# Patient Record
Sex: Male | Born: 1935
Health system: Southern US, Community
[De-identification: ages and names within clinical notes are randomized; demographics above are authoritative.]

## PROBLEM LIST (undated history)

## (undated) DIAGNOSIS — C4490 Unspecified malignant neoplasm of skin, unspecified: Secondary | ICD-10-CM

## (undated) DIAGNOSIS — K4090 Unilateral inguinal hernia, without obstruction or gangrene, not specified as recurrent: Secondary | ICD-10-CM

## (undated) DIAGNOSIS — J302 Other seasonal allergic rhinitis: Secondary | ICD-10-CM

## (undated) DIAGNOSIS — H9319 Tinnitus, unspecified ear: Secondary | ICD-10-CM

## (undated) DIAGNOSIS — E785 Hyperlipidemia, unspecified: Secondary | ICD-10-CM

## (undated) DIAGNOSIS — N400 Enlarged prostate without lower urinary tract symptoms: Secondary | ICD-10-CM

## (undated) DIAGNOSIS — B059 Measles without complication: Secondary | ICD-10-CM

## (undated) DIAGNOSIS — Z0389 Encounter for observation for other suspected diseases and conditions ruled out: Secondary | ICD-10-CM

## (undated) DIAGNOSIS — K635 Polyp of colon: Secondary | ICD-10-CM

## (undated) DIAGNOSIS — I4892 Unspecified atrial flutter: Secondary | ICD-10-CM

## (undated) DIAGNOSIS — R0989 Other specified symptoms and signs involving the circulatory and respiratory systems: Secondary | ICD-10-CM

## (undated) DIAGNOSIS — E559 Vitamin D deficiency, unspecified: Secondary | ICD-10-CM

## (undated) DIAGNOSIS — I4819 Other persistent atrial fibrillation: Secondary | ICD-10-CM

## (undated) DIAGNOSIS — G629 Polyneuropathy, unspecified: Secondary | ICD-10-CM

## (undated) DIAGNOSIS — B269 Mumps without complication: Secondary | ICD-10-CM

## (undated) DIAGNOSIS — R0789 Other chest pain: Secondary | ICD-10-CM

## (undated) DIAGNOSIS — K429 Umbilical hernia without obstruction or gangrene: Secondary | ICD-10-CM

## (undated) DIAGNOSIS — I10 Essential (primary) hypertension: Secondary | ICD-10-CM

## (undated) DIAGNOSIS — D696 Thrombocytopenia, unspecified: Secondary | ICD-10-CM

## (undated) HISTORY — DX: Other persistent atrial fibrillation: I48.19

## (undated) HISTORY — DX: Unilateral inguinal hernia, without obstruction or gangrene, not specified as recurrent: K40.90

## (undated) HISTORY — DX: Polyneuropathy, unspecified: G62.9

## (undated) HISTORY — DX: Umbilical hernia without obstruction or gangrene: K42.9

## (undated) HISTORY — DX: Mumps without complication: B26.9

## (undated) HISTORY — DX: Other specified symptoms and signs involving the circulatory and respiratory systems: R09.89

## (undated) HISTORY — DX: Polyp of colon: K63.5

## (undated) HISTORY — DX: Other chest pain: R07.89

## (undated) HISTORY — DX: Thrombocytopenia, unspecified: D69.6

## (undated) HISTORY — PX: HAND SURGERY: SHX662

## (undated) HISTORY — DX: Measles without complication: B05.9

## (undated) HISTORY — DX: Tinnitus, unspecified ear: H93.19

## (undated) HISTORY — PX: INGUINAL HERNIA REPAIR: SUR1180

## (undated) HISTORY — DX: Unspecified atrial flutter: I48.92

## (undated) HISTORY — DX: Benign prostatic hyperplasia without lower urinary tract symptoms: N40.0

## (undated) HISTORY — DX: Vitamin D deficiency, unspecified: E55.9

## (undated) SURGERY — ECHOCARDIOGRAM, TRANSESOPHAGEAL
Anesthesia: Moderate Sedation

---

## 2000-11-02 DIAGNOSIS — IMO0001 Reserved for inherently not codable concepts without codable children: Secondary | ICD-10-CM

## 2000-11-02 HISTORY — PX: CARDIAC CATHETERIZATION: SHX172

## 2000-11-02 HISTORY — DX: Reserved for inherently not codable concepts without codable children: IMO0001

## 2001-06-08 ENCOUNTER — Other Ambulatory Visit: Admission: RE | Admit: 2001-06-08 | Discharge: 2001-06-08 | Payer: Self-pay | Admitting: Urology

## 2001-06-08 ENCOUNTER — Encounter (INDEPENDENT_AMBULATORY_CARE_PROVIDER_SITE_OTHER): Payer: Self-pay | Admitting: Specialist

## 2001-09-25 ENCOUNTER — Encounter: Payer: Self-pay | Admitting: Emergency Medicine

## 2001-09-25 ENCOUNTER — Inpatient Hospital Stay (HOSPITAL_COMMUNITY): Admission: EM | Admit: 2001-09-25 | Discharge: 2001-09-26 | Payer: Self-pay | Admitting: Emergency Medicine

## 2002-07-05 ENCOUNTER — Emergency Department (HOSPITAL_COMMUNITY): Admission: EM | Admit: 2002-07-05 | Discharge: 2002-07-05 | Payer: Self-pay | Admitting: *Deleted

## 2003-11-21 ENCOUNTER — Encounter (INDEPENDENT_AMBULATORY_CARE_PROVIDER_SITE_OTHER): Payer: Self-pay | Admitting: Specialist

## 2003-11-21 ENCOUNTER — Ambulatory Visit (HOSPITAL_COMMUNITY): Admission: RE | Admit: 2003-11-21 | Discharge: 2003-11-21 | Payer: Self-pay | Admitting: *Deleted

## 2004-01-10 ENCOUNTER — Ambulatory Visit (HOSPITAL_COMMUNITY): Admission: RE | Admit: 2004-01-10 | Discharge: 2004-01-10 | Payer: Self-pay | Admitting: *Deleted

## 2004-08-07 ENCOUNTER — Ambulatory Visit: Payer: Self-pay | Admitting: Cardiology

## 2005-04-09 ENCOUNTER — Emergency Department (HOSPITAL_COMMUNITY): Admission: EM | Admit: 2005-04-09 | Discharge: 2005-04-09 | Payer: Self-pay | Admitting: *Deleted

## 2006-02-24 ENCOUNTER — Ambulatory Visit: Payer: Self-pay | Admitting: Internal Medicine

## 2006-11-03 ENCOUNTER — Encounter: Admission: RE | Admit: 2006-11-03 | Discharge: 2006-11-03 | Payer: Self-pay | Admitting: *Deleted

## 2009-11-02 DIAGNOSIS — D696 Thrombocytopenia, unspecified: Secondary | ICD-10-CM

## 2009-11-02 HISTORY — DX: Thrombocytopenia, unspecified: D69.6

## 2010-01-01 ENCOUNTER — Encounter: Admission: RE | Admit: 2010-01-01 | Discharge: 2010-01-01 | Payer: Self-pay | Admitting: General Surgery

## 2010-02-10 ENCOUNTER — Ambulatory Visit (HOSPITAL_COMMUNITY): Admission: RE | Admit: 2010-02-10 | Discharge: 2010-02-10 | Payer: Self-pay | Admitting: General Surgery

## 2011-01-21 LAB — CBC
MCV: 94.1 fL (ref 78.0–100.0)
WBC: 4.6 10*3/uL (ref 4.0–10.5)

## 2011-01-21 LAB — COMPREHENSIVE METABOLIC PANEL
ALT: 18 U/L (ref 0–53)
AST: 22 U/L (ref 0–37)
Albumin: 3.7 g/dL (ref 3.5–5.2)
Calcium: 8.6 mg/dL (ref 8.4–10.5)
Chloride: 108 mEq/L (ref 96–112)
Creatinine, Ser: 1.12 mg/dL (ref 0.4–1.5)
GFR calc Af Amer: >60 mL/min (ref >60)
GFR calc non Af Amer: >60 mL/min (ref >60)
Glucose, Bld: 103 mg/dL — ABNORMAL HIGH (ref 70–99)
Potassium: 3.7 mEq/L (ref 3.5–5.1)
Sodium: 139 mEq/L (ref 135–145)
Total Bilirubin: 0.8 mg/dL (ref 0.3–1.2)
Total Protein: 6.7 g/dL (ref 6.0–8.3)

## 2011-01-21 LAB — URINALYSIS, ROUTINE W REFLEX MICROSCOPIC
Bilirubin Urine: NEGATIVE
Glucose, UA: NEGATIVE mg/dL
Urobilinogen, UA: 1 mg/dL (ref 0.0–1.0)

## 2011-01-21 LAB — DIFFERENTIAL
Basophils Absolute: 0 10*3/uL (ref 0.0–0.1)
Basophils Relative: 1 % (ref 0–1)
Eosinophils Relative: 3 % (ref 0–5)
Monocytes Absolute: 0.3 10*3/uL (ref 0.1–1.0)
Monocytes Relative: 7 % (ref 3–12)
Neutro Abs: 3.3 10*3/uL (ref 1.7–7.7)

## 2011-03-06 ENCOUNTER — Emergency Department (HOSPITAL_BASED_OUTPATIENT_CLINIC_OR_DEPARTMENT_OTHER)
Admission: EM | Admit: 2011-03-06 | Discharge: 2011-03-06 | Disposition: A | Discharge status: Home / Self Care | Payer: Medicare Other | Attending: Emergency Medicine | Admitting: Emergency Medicine

## 2011-03-06 DIAGNOSIS — I4891 Unspecified atrial fibrillation: Secondary | ICD-10-CM | POA: Insufficient documentation

## 2011-03-06 DIAGNOSIS — E7800 Pure hypercholesterolemia, unspecified: Secondary | ICD-10-CM | POA: Insufficient documentation

## 2011-03-06 DIAGNOSIS — R002 Palpitations: Secondary | ICD-10-CM | POA: Insufficient documentation

## 2011-03-06 DIAGNOSIS — I10 Essential (primary) hypertension: Secondary | ICD-10-CM | POA: Insufficient documentation

## 2011-03-06 DIAGNOSIS — Z79899 Other long term (current) drug therapy: Secondary | ICD-10-CM | POA: Insufficient documentation

## 2011-03-06 LAB — BASIC METABOLIC PANEL
BUN: 19 mg/dL (ref 6–23)
CO2: 25 mEq/L (ref 19–32)
Calcium: 9.2 mg/dL (ref 8.4–10.5)
GFR calc non Af Amer: >60 mL/min (ref >60)
Glucose, Bld: 136 mg/dL — ABNORMAL HIGH (ref 70–99)

## 2011-03-20 NOTE — Op Note (Signed)
NAME:  AMEDEO, DETWEILER NO.:  [id number]   MEDICAL RECORD NO.:  [id number]                   PATIENT TYPE:  AMB   LOCATION:  ENDO                                 FACILITY:  [facility]   PHYSICIAN:  Georgiana Spinner, M.D.                 DATE OF BIRTH:  12/28/1934   DATE OF PROCEDURE:  11/21/2003  DATE OF DISCHARGE:                                 OPERATIVE REPORT   PROCEDURE:  Colonoscopy with biopsy.   INDICATIONS:  Diarrhea, colon cancer screening.   ANESTHESIA:  1. Demerol 10 mg.  2. Versed 1 mg.   DESCRIPTION OF PROCEDURE:  With patient mildly sedated in the left lateral  decubitus position, the Olympus videoscopic colonoscope was inserted in the  rectum after normal rectal exam and passed under direct vision to the cecum,  identified by the ileocecal valve and crow's foot of the cecum, the latter  of which was photographed.  We entered into the terminal ileum which  appeared normal and was photographed.  From this point, the colonoscope was  then slowly withdrawn, taking circumferential views of the colonic mucosa,  stopping only to take random colonic biopsies along the way until we reached  the rectum which appeared normal in direct and retroflexed view.  The  endoscope was straightened, withdrawn.  The patient's vital signs and pulse  oximeter remained stable.  The patient tolerated the procedure well without  apparent complications.   FINDINGS:  1. Thickened sigmoid colon with diverticulosis.  2. Otherwise, an unremarkable examination.  3. Await biopsy report.  4. The patient will call me for results and follow up with me as an     outpatient.                                               Georgiana Spinner, M.D.    GMO/MEDQ  D:  11/21/2003  T:  11/21/2003  Job:  [id number]

## 2011-03-20 NOTE — H&P (Signed)
[place]. [facility]  Patient:    Stanley, Waters Visit Number: [id number] MRN: [id number]          Service Type: MED Location: [phone] Attending Physician:  Clovis Cao Dictated by:   Marya Fossa, P.A. Admit Date:  09/25/2001                           History and Physical  DATE OF BIRTH: Mar 17, 1935  ADMISSION DIAGNOSES:  1. New onset atrial fibrillation.  2. Benign prostatic hypertrophy.  CHIEF COMPLAINT: Heart racing and shortness of breath.  HISTORY OF PRESENT ILLNESS: Stanley Waters is a pleasant 76 year old married white male, without prior cardiac history.  He has had shortness of breath x 2-3 months.  Over this times he has had intermittent heart racing which has been short-lived.  Today while helping his wife off the commode his heart began racing and persisted.  He felt weak, short of breath, with mild chest tightness.  Took Tums and aspirin and felt like his heart slowed a little. This went on for an hour to 1-1/2 hours.  He called Dr. Guadlupe Spanish answering service and then decided to come in and get checked out.  His pastor brought him to the emergency room.  He is currently feeling better.  IV fluids have been started but no meds have been given.  His admitting EKG shows A fib with rapid ventricular rate, heart rate 121, BP stable.  ALLERGIES: NKDA.  MEDICATIONS: Doxazosin 4 mg q.d.  PAST MEDICAL HISTORY: BPH.  No MI, stroke, diabetes, or thyroid disease.  SOCIAL HISTORY: The patient is married.  Wife has Alzheimers disease.  Father of two, grandfather of four.  Quit smoking 38 years ago.  No alcohol.  No cocaine or marijuana.  He currently works in Human resources officer.  FAMILY HISTORY: Mother at age 72 died of CVA.  Father deceased at age 61 of old age.  He had a pacemaker.  He had a total of four sisters.  One is deceased.  Four brothers, all four deceased.  One had CHF.  REVIEW OF SYSTEMS: No constitutional  symptoms.  He did have a cold three weeks ago.  No GI complaints.  Does have BPH.  No significant weight changes.  PHYSICAL EXAMINATION:  VITAL SIGNS: Blood pressure 124/82, pulse 69, respirations 18.  GENERAL: The patient is alert and oriented x 3, in no acute distress.  He is attended by his daughter-in-law and son.  HEENT: Normocephalic, atraumatic.  PERRL.  EOMI.  Nares patent.  Pharynx benign.  NECK: Supple without bruits or masses.  LUNGS: Clear to auscultation.  COR: Regular rate and rhythm without murmurs, rubs, or gallops.  ABDOMEN: Soft, nontender, nondistended.  Normoactive bowel sounds x 4 quadrants.  No HSM.  EXTREMITIES: No edema, 2+ femoral pulses, 2+ tibial pulses, no bruits.  NEUROLOGIC: No gross focal deficits noted.  RECTAL: Heme negative.  LABORATORY DATA: EKG shows A fib with RVR.  Rate 121.  Chest x-ray pending.  WBC 6.7, hemoglobin 15.6, platelets 186,000.  CK 80, MB 3.9, troponin I less than 0.01.  INR 1.2.  BMP pending.  IMPRESSION:  1. New onset atrial fibrillation, spontaneous conversion to sinus rhythm.  2. Unknown lipid status.  3. Benign prostatic hypertrophy.  PLAN: We will admit the patient now despite having converted to sinus rhythm. He was in atrial fibrillation for a prolonged period of time.  Will start him  on IV heparin.  Will also start him on Coumadin tonight but long-term management will need to be discussed with Dr. Lucas Mallow in the morning.  Will transfer care to Dr. Lucas Mallow in the morning.  Will order a 2D echocardiogram. Need to consider outpatient stress testing.  Will add low-dose beta-blocker to help prevent recurrent tachyarrhythmia. Dictated by:   Marya Fossa, P.A. Attending Physician:  Clovis Cao DD:  09/25/01 TD:  09/26/01 Job: [id number]

## 2011-03-20 NOTE — Op Note (Signed)
NAME:  Stanley Waters, Stanley Waters NO.:  [id number]   MEDICAL RECORD NO.:  [id number]                   PATIENT TYPE:  AMB   LOCATION:  ENDO                                 FACILITY:  [facility]   PHYSICIAN:  Georgiana Spinner, M.D.                 DATE OF BIRTH:  Apr 05, 1935   DATE OF PROCEDURE:  11/21/2003  DATE OF DISCHARGE:                                 OPERATIVE REPORT   PROCEDURE:  Upper endoscopy.   INDICATIONS:  Abdominal pain.   ANESTHESIA:  1. Demerol 50 mg.  2. Versed 5 mg.   DESCRIPTION OF PROCEDURE:  With patient mildly sedated in the left lateral  decubitus position, the Olympus videoscopic endoscope was inserted in the  mouth, passed under direct vision through the esophagus, which appeared  normal.  There was no evidence of Barrett's.  We entered into the stomach.  Fundus, body, antrum, duodenal bulb, and second portion of duodenum were  visualized.  Of note, there was a distinct lack of peristalsis.  From this  point, the endoscope was slowly withdrawn, taking circumferential views of  the duodenal mucosa until the endoscope then pulled back into the stomach,  placed in retroflexion to view the stomach from below.  The endoscope was  straightened and withdrawn, taking circumferential views of the remaining  gastric and esophageal mucosa, stopping only in the stomach to take biopsies  of erythema seen in the antrum and body.  The patient's vital signs and  pulse oximeter remained stable.  The patient tolerated the procedure well  without apparent complications.   FINDINGS:  Mild erythema of gastric antrum and body, decreased peristalsis.   PLAN:  1. Await biopsy report.  2. The patient will call me for results and follow up with me as an     outpatient.  3. Proceed to colonoscopy as planned.                                               Georgiana Spinner, M.D.    GMO/MEDQ  D:  11/21/2003  T:  11/21/2003  Job:  [id number]

## 2011-03-20 NOTE — Cardiovascular Report (Signed)
[place]  Patient:    Stanley Waters, Stanley Waters Visit Number: [id number] MRN: [id number]          Service Type: MED Location: [phone] Attending Physician:  Clovis Cao Dictated by:   Aram Candela. Aleen Campi, M.D. Proc. Date: 09/26/01 Admit Date:  09/25/2001   CC:         Jaclyn Prime. Lucas Mallow, M.D.  Cardiac Catheterization Lab,    Cardiac Catheterization  PROCEDURES: 1. Left heart catheterization. 2. Coronary cineangiography. 3. Left ventricular cineangiography. 4. Abdominal aortogram. 5. Perclose of the right femoral artery.  INDICATION FOR PROCEDURES:  This 76 year old male was admitted last evening after the onset of racing heart which was associated with shortness of breath, weakness, and mid chest tightness.  His initial enzymes were negative; however, this mornings enzymes were mildly positive, suggesting myocardial ischemia.  He also has a history of intermittent hypertension which is not presently treated.  PROCEDURE IN DETAIL:  After signing an informed consent, the patient was premedicated with 50 mg of Benadryl intravenously and brought to the cardiac catheterization lab.  His right groin was prepped and draped in a sterile fashion and anesthetized locally with 1% lidocaine.  A 6-French introducer sheath was inserted percutaneously into the right femoral artery.  Then 6-French, #4 Judkins coronary cathethers were used to make injections into the coronary arteries.  A 6-French pigtail catheter was used to measure pressures in the left ventricle and aorta and to make midstream injections into the left ventricle and abdominal aorta.  The patient tolerated the procedure well, and no complications were noted.  At the end of the procedure, the catheter and sheath were removed from the right femoral artery, and hemostasis was easily obtained with a Perclose closure system.  MEDICATIONS GIVEN:  Versed 1 mg IV.  HEMODYNAMIC  DATA:  Left ventricular pressure 116/5 to 17, aortic pressure 116/69 with a mean of 87.  Left ventricular ejection fraction was estimated at 60%.  CINE FINDINGS:  CORONARY CINEANGIOGRAPHY:  Left coronary artery: The ostium and left main appear normal.  Left anterior descending artery: The LAD has a minor plaque in its proximal segment which causes a less than 20% narrowing and causes very mild sequestration of dye in the middle segment.  There is very good antegrade flow into the distal segment.  The septal and anterolateral branches all appear normal.  Optional diagonal branch appears normal without evidence of stenosis.  Circumflex coronary artery appears normal without evidence for plaque.  There is very normal flow in the circumflex.  Right coronary artery: The ostium appears normal.  The proximal segment has a minor plaque causing a segmental 20% stenosis.  There is very good antegrade flow throughout the right coronary system.  There was no evidence for other plaque throughout the middle and distal segment.  LEFT VENTRICULAR CINEANGIOGRAM:  The left ventricular chamber size and contractility appeared normal.  The ejection fraction was estimated at approximately 60%.  There was no evidence for segmental abnormality.  The mitral and aortic valves appeared normal.  ABDOMINAL AORTOGRAM:  The distal abdominal aorta has moderate calcification which was seen while inserting the guidewire and catheters.  There was no significant stenosis and no evidence of aneurysm.  The proximal abdominal aorta appears normal.  The renal arteries appear normal with normal flow.  FINAL DIAGNOSES: 1. Essentially normal coronary flow with minor nonobstructive plaque in the    proximal left anterior descending artery and right coronary artery at  20%. 2. Normal left ventricular function. 3. Moderate calcification of the distal abdominal aorta without stenosis or    aneurysm. 4. Normal renal  arteries. 5. Successful Perclose of the right femoral artery.  DISPOSITION:  As per Dr. Lucas Mallow. Dictated by:   Aram Candela. Aleen Campi, M.D. Attending Physician:  Clovis Cao DD:  09/26/01 TD:  09/26/01 Job: [id number]

## 2011-03-22 ENCOUNTER — Emergency Department (HOSPITAL_COMMUNITY): Payer: Medicare Other

## 2011-03-22 ENCOUNTER — Emergency Department (HOSPITAL_COMMUNITY)
Admission: EM | Admit: 2011-03-22 | Discharge: 2011-03-22 | Disposition: A | Discharge status: Home / Self Care | Payer: Medicare Other | Attending: Emergency Medicine | Admitting: Emergency Medicine

## 2011-03-22 DIAGNOSIS — R509 Fever, unspecified: Secondary | ICD-10-CM | POA: Insufficient documentation

## 2011-03-22 DIAGNOSIS — I440 Atrioventricular block, first degree: Secondary | ICD-10-CM | POA: Insufficient documentation

## 2011-03-22 DIAGNOSIS — I10 Essential (primary) hypertension: Secondary | ICD-10-CM | POA: Insufficient documentation

## 2011-03-22 DIAGNOSIS — N390 Urinary tract infection, site not specified: Secondary | ICD-10-CM | POA: Insufficient documentation

## 2011-03-22 LAB — CBC
Hemoglobin: 15 g/dL (ref 13.0–17.0)
MCH: 32.1 pg (ref 26.0–34.0)
MCHC: 35.5 g/dL (ref 30.0–36.0)
MCV: 90.2 fL (ref 78.0–100.0)
Platelets: 131 10*3/uL — ABNORMAL LOW (ref 150–400)
RBC: 4.68 MIL/uL (ref 4.22–5.81)
WBC: 5 10*3/uL (ref 4.0–10.5)

## 2011-03-22 LAB — URINALYSIS, ROUTINE W REFLEX MICROSCOPIC
Bilirubin Urine: NEGATIVE
Leukocytes, UA: NEGATIVE
Urobilinogen, UA: 1 mg/dL (ref 0.0–1.0)
pH: 6.5 (ref 5.0–8.0)

## 2011-03-22 LAB — DIFFERENTIAL
Basophils Absolute: 0 10*3/uL (ref 0.0–0.1)
Basophils Relative: 0 % (ref 0–1)
Eosinophils Absolute: 0.1 10*3/uL (ref 0.0–0.7)
Lymphs Abs: 0.8 10*3/uL (ref 0.7–4.0)
Monocytes Relative: 14 % — ABNORMAL HIGH (ref 3–12)

## 2011-03-22 LAB — BASIC METABOLIC PANEL
CO2: 25 mEq/L (ref 19–32)
Calcium: 8.9 mg/dL (ref 8.4–10.5)
Glucose, Bld: 107 mg/dL — ABNORMAL HIGH (ref 70–99)

## 2011-03-29 LAB — CULTURE, BLOOD (ROUTINE X 2)
Culture  Setup Time: 201205210012
Culture  Setup Time: 201205210012
Culture: NO GROWTH
Culture: NO GROWTH

## 2011-09-22 DIAGNOSIS — K635 Polyp of colon: Secondary | ICD-10-CM

## 2011-09-22 HISTORY — DX: Polyp of colon: K63.5

## 2011-09-22 HISTORY — PX: COLONOSCOPY W/ POLYPECTOMY: SHX1380

## 2012-07-03 ENCOUNTER — Encounter (HOSPITAL_BASED_OUTPATIENT_CLINIC_OR_DEPARTMENT_OTHER): Payer: Self-pay | Admitting: Emergency Medicine

## 2012-07-03 ENCOUNTER — Emergency Department (HOSPITAL_BASED_OUTPATIENT_CLINIC_OR_DEPARTMENT_OTHER)
Admission: EM | Admit: 2012-07-03 | Discharge: 2012-07-03 | Disposition: A | Discharge status: Home / Self Care | Payer: Medicare Other | Attending: Emergency Medicine | Admitting: Emergency Medicine

## 2012-07-03 DIAGNOSIS — E785 Hyperlipidemia, unspecified: Secondary | ICD-10-CM | POA: Insufficient documentation

## 2012-07-03 DIAGNOSIS — I2510 Atherosclerotic heart disease of native coronary artery without angina pectoris: Secondary | ICD-10-CM | POA: Insufficient documentation

## 2012-07-03 DIAGNOSIS — R002 Palpitations: Secondary | ICD-10-CM | POA: Insufficient documentation

## 2012-07-03 DIAGNOSIS — I4891 Unspecified atrial fibrillation: Secondary | ICD-10-CM

## 2012-07-03 DIAGNOSIS — R000 Tachycardia, unspecified: Secondary | ICD-10-CM | POA: Insufficient documentation

## 2012-07-03 DIAGNOSIS — Z79899 Other long term (current) drug therapy: Secondary | ICD-10-CM | POA: Insufficient documentation

## 2012-07-03 DIAGNOSIS — I10 Essential (primary) hypertension: Secondary | ICD-10-CM | POA: Insufficient documentation

## 2012-07-03 HISTORY — DX: Hyperlipidemia, unspecified: E78.5

## 2012-07-03 HISTORY — DX: Essential (primary) hypertension: I10

## 2012-07-03 LAB — CBC
HCT: 43.1 % (ref 39.0–52.0)
MCV: 90.4 fL (ref 78.0–100.0)
RBC: 4.77 MIL/uL (ref 4.22–5.81)
WBC: 5.8 10*3/uL (ref 4.0–10.5)

## 2012-07-03 LAB — BASIC METABOLIC PANEL
BUN: 16 mg/dL (ref 6–23)
CO2: 25 mEq/L (ref 19–32)
Chloride: 102 mEq/L (ref 96–112)
Creatinine, Ser: 1.1 mg/dL (ref 0.50–1.35)

## 2012-07-03 LAB — TROPONIN I: Troponin I: <0.3 ng/mL (ref <0.30)

## 2012-07-03 MED ORDER — METOPROLOL TARTRATE 1 MG/ML IV SOLN
5.0000 mg | Freq: Once | INTRAVENOUS | Status: AC
  Administered 2012-07-03: 5 mg via INTRAVENOUS
  Filled 2012-07-03: qty 5

## 2012-07-03 NOTE — ED Provider Notes (Addendum)
History     CSN: [id number]  Arrival date & time 07/03/12  1145   First MD Initiated Contact with Patient 07/03/12 1302      Chief Complaint  Patient presents with  . Tachycardia    (Consider location/radiation/quality/duration/timing/severity/associated sxs/prior treatment) HPI Comments: Patient presents today with her palpitations. He states he has a history of intermittent atrial fibrillation and feels like he was "fibrillating today". He states that the feeling started about 5:30 this morning and has lasted until now. He denies any chest pain or tightness. Denies any shortness of breath. He had a little bit of dizziness earlier but denies any now. He states he's had this happen in the past but doesn't usually last this long. She denies any leg pain or swelling. Denies any recent changes in his medication. He took an extra dose of his Lopressor this morning.   Past Medical History  Diagnosis Date  . Atrial fibrillation   . Hypertension   . Coronary artery disease   . Hyperlipemia     No past surgical history on file.  No family history on file.  History  Substance Use Topics  . Smoking status: Not on file  . Smokeless tobacco: Not on file  . Alcohol Use:       Review of Systems  Constitutional: Negative for fever, chills, diaphoresis and fatigue.  HENT: Negative for congestion, rhinorrhea and sneezing.   Eyes: Negative.   Respiratory: Negative for cough, chest tightness and shortness of breath.   Cardiovascular: Positive for palpitations. Negative for chest pain and leg swelling.  Gastrointestinal: Negative for nausea, vomiting, abdominal pain, diarrhea and blood in stool.  Genitourinary: Negative for frequency, hematuria, flank pain and difficulty urinating.  Musculoskeletal: Negative for back pain and arthralgias.  Skin: Negative for rash.  Neurological: Negative for dizziness, speech difficulty, weakness, numbness and headaches.    Allergies  Doxycycline and  Prednisone  Home Medications   Current Outpatient Rx  Name Route Sig Dispense Refill  . ASPIRIN 162 MG PO TBEC Oral Take 162 mg by mouth daily.    [name] DOXAZOSIN MESYLATE 8 MG PO TABS Oral Take 8 mg by mouth at bedtime.    . OMEGA-3 FATTY ACIDS 1000 MG PO CAPS Oral Take 1,200 mg by mouth daily.    . FUROSEMIDE 20 MG PO TABS Oral Take 20 mg by mouth as needed.    [name] LISINOPRIL 2.5 MG PO TABS Oral Take 2.5 mg by mouth 2 (two) times daily.    [name] METOPROLOL TARTRATE 25 MG PO TABS Oral Take 12.5 mg by mouth 2 (two) times daily.    . ICAPS PO CAPS Oral Take 1 capsule by mouth daily.    [name] PROPAFENONE HCL 225 MG PO TABS Oral Take 225 mg by mouth 3 (three) times daily.    [name] SIMVASTATIN 5 MG PO TABS Oral Take 5 mg by mouth at bedtime.    [name] VITAMIN D (ERGOCALCIFEROL) 50000 UNITS PO CAPS Oral Take 50,000 Units by mouth every 7 (seven) days.      BP 147/95  Pulse 89  Temp 97.8 F (36.6 C) (Oral)  Resp 16  Ht 5\' 11"  (1.803 m)  Wt 200 lb (90.719 kg)  BMI 27.89 kg/m2  SpO2 100%  Physical Exam  Constitutional: He is oriented to person, place, and time. He appears well-developed and well-nourished.  HENT:  Head: Normocephalic and atraumatic.  Eyes: Pupils are equal, round, and reactive to light.  Neck: Normal range of motion. Neck  supple.  Cardiovascular: Normal rate and normal heart sounds.  An irregular rhythm present.  Pulmonary/Chest: Effort normal and breath sounds normal. No respiratory distress. He has no wheezes. He has no rales. He exhibits no tenderness.  Abdominal: Soft. Bowel sounds are normal. There is no tenderness. There is no rebound and no guarding.  Musculoskeletal: Normal range of motion. He exhibits no edema.  Lymphadenopathy:    He has no cervical adenopathy.  Neurological: He is alert and oriented to person, place, and time.  Skin: Skin is warm and dry. No rash noted.  Psychiatric: He has a normal mood and affect.    ED Course  Procedures (including critical care  time)  Labs Reviewed  CBC - Abnormal; Notable for the following:    Platelets 141 (*)     All other components within normal limits  BASIC METABOLIC PANEL - Abnormal; Notable for the following:    Glucose, Bld 103 (*)     GFR calc non Af Amer 64 (*)     GFR calc Af Amer 74 (*)     All other components within normal limits  TROPONIN I   No results found. Results for orders placed during the hospital encounter of 07/03/12  CBC      Component Value Range   WBC 5.8  4.0 - 10.5 K/uL   RBC 4.77  4.22 - 5.81 MIL/uL   Hemoglobin 15.5  13.0 - 17.0 g/dL   HCT 14.7  82.9 - 56.2 %   MCV 90.4  78.0 - 100.0 fL   MCH 32.5  26.0 - 34.0 pg   MCHC 36.0  30.0 - 36.0 g/dL   RDW 13.0  86.5 - 78.4 %   Platelets 141 (*) 150 - 400 K/uL  BASIC METABOLIC PANEL      Component Value Range   Sodium 137  135 - 145 mEq/L   Potassium 4.3  3.5 - 5.1 mEq/L   Chloride 102  96 - 112 mEq/L   CO2 25  19 - 32 mEq/L   Glucose, Bld 103 (*) 70 - 99 mg/dL   BUN 16  6 - 23 mg/dL   Creatinine, Ser 6.96  0.50 - 1.35 mg/dL   Calcium 9.2  8.4 - 29.5 mg/dL   GFR calc non Af Amer 64 (*) >90 mL/min   GFR calc Af Amer 74 (*) >90 mL/min  TROPONIN I      Component Value Range   Troponin I <0.30  <0.30 ng/mL     Date: 07/03/2012  Rate: 86  Rhythm: atrial fibrillation  QRS Axis: normal  Intervals: normal  ST/T Wave abnormalities: nonspecific ST/T changes  Conduction Disutrbances:none  Narrative Interpretation:   Old EKG Reviewed: changes noted, last EKG in sinus rhythm   Date: 07/03/2012  Rate: 82  Rhythm: atrial fibrillation  QRS Axis: normal  Intervals: normal  ST/T Wave abnormalities: nonspecific ST/T changes  Conduction Disutrbances:none  Narrative Interpretation:   Old EKG Reviewed: unchanged    1. Atrial fibrillation       MDM  Patient presents in atrial fibrillation. His rate being controlled in the 80s and 90s. And he is currently asymptomatic with atrial fibrillation. I did discuss this with  Dr. Royann Shivers  who is on call for [facility] heart [facility] vascular. He advised that patient could go home if he was okay with that and he will schedule followup appointment with Dr. Allyson Sabal  on Tuesday. He did not feel that it would be very beneficial to  admit him to the hospital at this point given that he would not be able to cardiovert him until he is adequately anticoagulated. He advises to go ahead and increase his metoprolol at home and have him follow up with Dr. Allyson Sabal on Tuesday. He's on call all weekend and advised patient to call him for any questions or return to the emergency department if he develops any symptoms with atrial fibrillation. Patient was okay with this. He is currently only taking his Lopressor once a day. I advised him to increase it to twice a day and followup on Tuesday. His heart rate did start to go up a little bit just prior to discharge in the low 100 range. He was given an extra dose of Lopressor prior to discharge.        Rolan Bucco, MD 07/03/12 1517  Rolan Bucco, MD 07/03/12 1526  Rolan Bucco, MD 07/03/12 1527

## 2012-07-03 NOTE — ED Notes (Signed)
Pt states he woke up at 5:45 this am with tachycardia.  Pt denies pain or SOB.  Pt admits to some dizziness.  No LOC.

## 2012-07-06 ENCOUNTER — Other Ambulatory Visit: Payer: Self-pay | Admitting: Internal Medicine

## 2012-07-07 ENCOUNTER — Ambulatory Visit (HOSPITAL_COMMUNITY): Payer: Medicare Other | Admitting: Anesthesiology

## 2012-07-07 ENCOUNTER — Encounter (HOSPITAL_COMMUNITY): Payer: Self-pay | Admitting: Anesthesiology

## 2012-07-07 ENCOUNTER — Ambulatory Visit (HOSPITAL_COMMUNITY)
Admission: RE | Admit: 2012-07-07 | Discharge: 2012-07-07 | Disposition: A | Discharge status: Home / Self Care | Payer: Medicare Other | Source: Ambulatory Visit | Attending: Internal Medicine | Admitting: Internal Medicine

## 2012-07-07 ENCOUNTER — Encounter (HOSPITAL_COMMUNITY): Payer: Self-pay | Admitting: *Deleted

## 2012-07-07 ENCOUNTER — Encounter (HOSPITAL_COMMUNITY)
Admission: RE | Disposition: A | Discharge status: Home / Self Care | Payer: Self-pay | Source: Ambulatory Visit | Attending: Internal Medicine

## 2012-07-07 DIAGNOSIS — I4891 Unspecified atrial fibrillation: Secondary | ICD-10-CM | POA: Insufficient documentation

## 2012-07-07 HISTORY — DX: Other seasonal allergic rhinitis: J30.2

## 2012-07-07 HISTORY — PX: TEE WITHOUT CARDIOVERSION: SHX5443

## 2012-07-07 HISTORY — PX: CARDIOVERSION: SHX1299

## 2012-07-07 SURGERY — ECHOCARDIOGRAM, TRANSESOPHAGEAL
Anesthesia: Moderate Sedation

## 2012-07-07 MED ORDER — SODIUM CHLORIDE 0.9 % IJ SOLN: 3.0000 mL | INTRAMUSCULAR | Status: DC | PRN

## 2012-07-07 MED ORDER — MIDAZOLAM HCL 10 MG/2ML IJ SOLN
INTRAMUSCULAR | Status: DC | PRN
  Administered 2012-07-07 (×3): 1 mg via INTRAVENOUS

## 2012-07-07 MED ORDER — MIDAZOLAM HCL 5 MG/ML IJ SOLN
INTRAMUSCULAR | Status: AC
  Filled 2012-07-07: qty 2

## 2012-07-07 MED ORDER — FENTANYL CITRATE 0.05 MG/ML IJ SOLN
INTRAMUSCULAR | Status: AC
  Filled 2012-07-07: qty 2

## 2012-07-07 MED ORDER — BUTAMBEN-TETRACAINE-BENZOCAINE 2-2-14 % EX AERO
INHALATION_SPRAY | CUTANEOUS | Status: DC | PRN
  Administered 2012-07-07: 1 via TOPICAL

## 2012-07-07 MED ORDER — PROPAFENONE HCL 225 MG PO TABS: 300.0000 mg | ORAL_TABLET | Freq: Three times a day (TID) | ORAL | Status: DC

## 2012-07-07 MED ORDER — LIDOCAINE VISCOUS 2 % MT SOLN
OROMUCOSAL | Status: DC | PRN
  Administered 2012-07-07: 10 mL via OROMUCOSAL

## 2012-07-07 MED ORDER — SODIUM CHLORIDE 0.9 % IV SOLN: INTRAVENOUS | Status: DC

## 2012-07-07 MED ORDER — FENTANYL CITRATE 0.05 MG/ML IJ SOLN
INTRAMUSCULAR | Status: DC | PRN
  Administered 2012-07-07 (×2): 25 ug via INTRAVENOUS

## 2012-07-07 MED ORDER — LIDOCAINE VISCOUS 2 % MT SOLN
OROMUCOSAL | Status: AC
  Filled 2012-07-07: qty 15

## 2012-07-07 MED ORDER — SODIUM CHLORIDE 0.9 % IV SOLN
INTRAVENOUS | Status: DC | PRN
  Administered 2012-07-07: 10:00:00 via INTRAVENOUS

## 2012-07-07 NOTE — Anesthesia Preprocedure Evaluation (Addendum)
Anesthesia Evaluation  Patient identified by MRN, date of birth, ID band Patient awake    Reviewed: Allergy & Precautions, H&P , NPO status , Patient's Chart, lab work & pertinent test results  Airway Mallampati: I TM Distance: >3 FB Neck ROM: Full    Dental   Pulmonary  breath sounds clear to auscultation        Cardiovascular hypertension, + CAD + dysrhythmias Atrial Fibrillation Rhythm:Irregular Rate:Normal     Neuro/Psych    GI/Hepatic   Endo/Other    Renal/GU      Musculoskeletal   Abdominal   Peds  Hematology   Anesthesia Other Findings   Reproductive/Obstetrics                           Anesthesia Physical Anesthesia Plan  ASA: III  Anesthesia Plan: General   Post-op Pain Management:    Induction: Intravenous  Airway Management Planned: Mask  Additional Equipment:   Intra-op Plan:   Post-operative Plan:   Informed Consent: I have reviewed the patients History and Physical, chart, labs and discussed the procedure including the risks, benefits and alternatives for the proposed anesthesia with the patient or authorized representative who has indicated his/her understanding and acceptance.     Plan Discussed with: CRNA and Surgeon  Anesthesia Plan Comments:         Anesthesia Quick Evaluation

## 2012-07-07 NOTE — Progress Notes (Signed)
  Echocardiogram Echocardiogram Transesophageal has been performed.  Stanley Waters 07/07/2012, 10:08 AM

## 2012-07-07 NOTE — Transfer of Care (Signed)
Immediate Anesthesia Transfer of Care Note  Patient: Stanley Waters  Procedure(s) Performed: Procedure(s) (LRB) with comments: TRANSESOPHAGEAL ECHOCARDIOGRAM (TEE) (N/A) CARDIOVERSION (N/A)  Patient Location: Radiology  Anesthesia Type: MAC  Level of Consciousness: sedated and patient cooperative  Airway & Oxygen Therapy: Patient Spontanous Breathing and Patient connected to nasal cannula oxygen  Post-op Assessment: Report given to PACU RN, Post -op Vital signs reviewed and stable and Patient moving all extremities X 4  Post vital signs: Reviewed and stable  Complications: No apparent anesthesia complications

## 2012-07-07 NOTE — Anesthesia Postprocedure Evaluation (Signed)
  Anesthesia Post-op Note  Patient: Stanley Waters  Procedure(s) Performed: Procedure(s) (LRB) with comments: TRANSESOPHAGEAL ECHOCARDIOGRAM (TEE) (N/A) CARDIOVERSION (N/A)  Patient Location: PACU and Endoscopy Unit  Anesthesia Type: General  Level of Consciousness: awake  Airway and Oxygen Therapy: Patient Spontanous Breathing  Post-op Pain: none  Post-op Assessment: Post-op Vital signs reviewed, Patient's Cardiovascular Status Stable, Respiratory Function Stable, Patent Airway, No signs of Nausea or vomiting and Pain level controlled  Post-op Vital Signs: stable  Complications: No apparent anesthesia complications

## 2012-07-07 NOTE — H&P (Signed)
     [facility]          INTERVAL PROCEDURE H&P   History and Physical Interval Note:  07/07/2012 9:09 AM  Stanley Waters has presented today for their planned procedure. The various methods of treatment have been discussed with the patient and family. After consideration of risks, benefits and other options for treatment, the patient has consented to the procedure.  The patients' outpatient history has been reviewed, patient examined, and no change in status from most recent office note within the past 30 days. I have reviewed the patients' chart and labs and will proceed as planned. Questions were answered to the patient's satisfaction.   History of a-fib on propafenone. Recent recurrence. Dose increased to 300 mg TID. Now on xarelto for anticoagulation. For TEE guided cardioversion today.   Stanley Nose, MD, [facility] Attending Cardiologist [facility]  Stanley Waters 07/07/2012, 9:09 AM

## 2012-07-07 NOTE — Progress Notes (Signed)
Cardioversion 150 joules at 1018. Converted from afib to sinus brady.

## 2012-07-07 NOTE — CV Procedure (Signed)
[facility]  TEE/CARDIOVERSION NOTE   TRANSESOPHAGEAL ECHOCARDIOGRAM (TEE):  Indictation: Atrial Fibrillation  Consent:   Informed consent was obtained prior to the procedure. The risks, benefits and alternatives for the procedure were discussed and the patient comprehended these risks.  Risks include, but are not limited to, cough, sore throat, vomiting, nausea, somnolence, esophageal and stomach trauma or perforation, bleeding, low blood pressure, aspiration, pneumonia, infection, trauma to the teeth and death.    Time Out: Verified patient identification, verified procedure, site/side was marked, verified correct patient position, special equipment/implants available, medications/allergies/relevent history reviewed, required imaging and test results available. Performed  Procedure:  After a procedural time-out, the patient was given 3 mg versed and 50 mcg fentanyl for moderate sedation.  The oropharynx was anesthetized 10 cc of topical 1% viscous lidocaine and 1 cetacaine spray.  The transesophageal probe was inserted in the esophagus and stomach without difficulty and multiple views were obtained.  The patient was kept under observation until the patient left the procedure room.  The patient left the procedure room in stable condition.   Agitated microbubble saline contrast was administered.  Complications:    Complications: None Patient did tolerate procedure well.  Findings:  1. LEFT VENTRICLE: The left ventricle is normal in structure with low normal systolic function without any thrombus or masses. LVEF is 50-55%.  2. RIGHT VENTRICLE:  The right ventricle is normal in structure and function without any thrombus or masses.    3. LEFT ATRIUM:  The left atrium is mildly dilated without any thrombus or masses. No spontaneous echo contrast was seen.  4. LEFT ATRIAL APPENDAGE:  The left atrial appendage is unilobular and  free of any thrombus or  masses. Pulse doppler velocities are low and indicate atrial fibrillation.  5. ATRIAL SEPTUM:  The atrial septum is free of any thrombus or masses.  There is no evidence for interatrial shunting by color doppler and saline microbubble.  6. RIGHT ATRIUM:  The right atrium is normal in size and function without any thrombus or masses.  7. MITRAL VALVE:  The mitral valve is normal in structure and function with trace regurgitation.  There were no vegetations or stenosis.  8. AORTIC VALVE:  The aortic valve is normal in structure and function without regurgitation.  There were no vegetations or stenosis  9. TRICUSPID VALVE:  The tricuspid valve is normal in structure and function with trace regurgitation.  There were no vegetations or stenosis  10.  PULMONIC VALVE:  The tricuspid valve is normal in structure and function without regurgitation.  There were no vegetations or stenosis.   11. AORTIC ARCH, ASCENDING AND DESCENDING AORTA:  There was mild (Grade 1) atherosclerosis of the proximal descending aorta.  CARDIOVERSION:     Time Out: Verified patient identification, verified procedure, site/side was marked, verified correct patient position, special equipment/implants available, medications/allergies/relevent history reviewed, required imaging and test results available.  Performed  Procedure:  1. Patient placed on cardiac monitor, pulse oximetry, supplemental oxygen as necessary.  2. Sedation administered per anesthesia 3. Pacer pads placed anterior and posterior chest. 4. Cardioverted 1 time(s).  5. Cardioverted at 150J biphasic.  Complications:  Complications: None Patient did tolerate procedure well.  Impression:  1. LVEF 50-55%. 2. No LAA thrombus, mildly dilated LA, no smoke. 3. Successful cardioversion to sinus bradycardia with a single 150J biphasic shock.  Recommendations:  1. He was instructed to increase propafenone to 300 mg TID (waiting to pick up dose  today at  the pharmacy). 2. Continue xarelto. 3. Follow-up with Dr. Royann Shivers as an outpatient.  Time Spent Directly with the Patient:  45 minutes   Chrystie Nose, MD, [facility] Attending Cardiologist [facility]  07/07/2012, 9:28 AM

## 2012-07-08 ENCOUNTER — Encounter (HOSPITAL_COMMUNITY): Payer: Self-pay | Admitting: Internal Medicine

## 2012-09-23 ENCOUNTER — Other Ambulatory Visit: Payer: Self-pay

## 2012-12-08 ENCOUNTER — Other Ambulatory Visit (HOSPITAL_COMMUNITY): Payer: Self-pay | Admitting: Cardiovascular Disease

## 2012-12-08 DIAGNOSIS — R0989 Other specified symptoms and signs involving the circulatory and respiratory systems: Secondary | ICD-10-CM

## 2012-12-13 ENCOUNTER — Ambulatory Visit (HOSPITAL_COMMUNITY)
Admission: RE | Admit: 2012-12-13 | Discharge: 2012-12-13 | Disposition: A | Discharge status: Home / Self Care | Payer: Medicare Other | Source: Ambulatory Visit | Attending: Cardiovascular Disease | Admitting: Cardiovascular Disease

## 2012-12-13 DIAGNOSIS — R0989 Other specified symptoms and signs involving the circulatory and respiratory systems: Secondary | ICD-10-CM

## 2012-12-13 HISTORY — PX: OTHER SURGICAL HISTORY: SHX169

## 2012-12-13 NOTE — Progress Notes (Signed)
Carotid artery duplex completed.  12/13/2012 9:14 AM Gertie Fey, RDMS, RDCS

## 2013-06-15 ENCOUNTER — Encounter: Payer: Self-pay | Admitting: Cardiovascular Disease

## 2013-06-19 ENCOUNTER — Encounter: Payer: Self-pay | Admitting: Cardiovascular Disease

## 2013-06-20 ENCOUNTER — Ambulatory Visit (INDEPENDENT_AMBULATORY_CARE_PROVIDER_SITE_OTHER): Payer: Medicare Other | Admitting: Physician Assistant

## 2013-06-20 ENCOUNTER — Encounter: Payer: Self-pay | Admitting: Physician Assistant

## 2013-06-20 VITALS — BP 118/72 | HR 61 | Ht 71.5 in | Wt 197.0 lb

## 2013-06-20 DIAGNOSIS — E785 Hyperlipidemia, unspecified: Secondary | ICD-10-CM

## 2013-06-20 DIAGNOSIS — I480 Paroxysmal atrial fibrillation: Secondary | ICD-10-CM

## 2013-06-20 DIAGNOSIS — I4891 Unspecified atrial fibrillation: Secondary | ICD-10-CM

## 2013-06-20 DIAGNOSIS — I959 Hypotension, unspecified: Secondary | ICD-10-CM

## 2013-06-20 DIAGNOSIS — I2510 Atherosclerotic heart disease of native coronary artery without angina pectoris: Secondary | ICD-10-CM

## 2013-06-20 LAB — CBC
HCT: 45.9 % (ref 39.0–52.0)
MCH: 32.3 pg (ref 26.0–34.0)
MCHC: 34.6 g/dL (ref 30.0–36.0)
MCV: 93.1 fL (ref 78.0–100.0)
RDW: 12.9 % (ref 11.5–15.5)

## 2013-06-20 LAB — BASIC METABOLIC PANEL
BUN: 12 mg/dL (ref 6–23)
Calcium: 9 mg/dL (ref 8.4–10.5)
Creat: 1.05 mg/dL (ref 0.50–1.35)
Potassium: 4.3 mEq/L (ref 3.5–5.3)

## 2013-06-20 NOTE — Assessment & Plan Note (Addendum)
Patient may be experiencing intermittent tachybradycardia syndrome. He noticed this past Sunday he heard feeling poorly his heart rate between 100 and 120. He took Lopressor 12.5 mg his heart rate decreased into the 40s. Also check his blood pressure was 86/53. Today his heart rate has improved his blood pressure is improved as well. Going to set him up for a CardioNet monitor for the next 2 weeks to see the extent of his bradycardia and tachycardia. He is taking past. I asked him to use his Lopressor 12.5 mg on a when necessary basis should his heart rate. Over 100 for a prolonged period symptoms. He may need repeat cardioversion and/or consideration for pacemaker implant.  I am also check a CBC as he looked a little pale and a basic metabolic panel.

## 2013-06-20 NOTE — Patient Instructions (Addendum)
1.  30 day heart monitor. 2.  Take the lopressor, 12.5mg , only if your HR increases over 100 for a period > 30 minutes.  Write in a journal when  you take the lopressor.   Need to consider another cardioversion.  3.  Follow up with Dr. Allyson Sabal in two weeks.   4.  Check some labs: CBC, BMET

## 2013-06-20 NOTE — Assessment & Plan Note (Signed)
Currently treated with simvastatin. Last lipid panel one year ago LDL 63 HDL 45 total cholesterol 129 triglycerides 14

## 2013-06-20 NOTE — Progress Notes (Signed)
Date:  06/20/2013   ID:  Gustavus Messing, DOB 13-Dec-1934, MRN [id number]  PCP:  Dorcas Carrow, MD  Primary Cardiologist:  Allyson Sabal    History of Present Illness: Stanley Waters is a 78 y.o. male The patient is a very pleasant 78 year old mildly overweight widowed Caucasian male father of 1, grandfather of 2 grandchildren who I last saw 4 months ago. He has a history of normal coronary arteries by cath in 2002.  He has PAF and has undergone a transesophageal echo, most recently by Dr. [place] [name] July 07, 2012.  His other problems include hypertension and hyperlipidemia.  Dr Allyson Sabal adjusted his medications because he was bradycardic and also changed him Eliquis to Xarelto at his request because of cost. He apparently had developed a rash since that time which he attributed to the Xarelto and was subsequently changed back to Eliquis  The patient presents today for six-month followup. He reports as of Sunday he started feeling poorly and noticed his heart rate increasing up to 102 and as high as 120 with minimal activity. He also noticed his blood pressure was 86/53.  He reports a lot of dizziness as well. But otherwise denies nausea, vomiting, fever, chest pain, shortness of breath, orthopnea, PND, cough, congestion, abdominal pain, hematochezia, melena, lower extremity edema.  Wt Readings from Last 3 Encounters:  06/20/13 197 lb (89.359 kg)  07/03/12 200 lb (90.719 kg)     Past Medical History  Diagnosis Date  . Hypertension   . Coronary artery disease   . Hyperlipemia   . Seasonal allergies   . Enlarged prostate     BPH - PSA of 6.7 this is consistant with his last PSA of 6.3  . Peripheral neuropathy   . CHF (congestive heart failure)   . Intestinal polyps 09/22/11    Colonoscopy removed a 2 mm sessile cecal polyp with a cold snare.  . Tinnitus   . Measles   . Mumps   . Pneumonia   . Inguinal hernia     Inguinal hernia repair (x) 2 1991, 2011  . Umbilical hernia   .  Vitamin D deficiency   . Chronic renal failure     Stage 2 in 11/2009  . Thrombocytopenia 2011    Very mild with platelets of 135,000.  [name] Atrial fibrillation     Transesophageal echo on 07/07/12 - EF =50-55%. Mild atheromatous disease of the proximal descending aorta. Cadioversion 07/07/12 successful.  . Atypical chest pain     Myoview performed 07/23/11 was completely normal. Post stress EF 61%.  . Bruit     Left asymptomatic bruit. Carotid Duplex 12/13/12 =mildly abnormal. *BILATERAL BULB/PROXIMAL ICAs: Demonstrated a mild amount of fibrous plaque w/no evidence od significant diameter reduction, tortuosity or other vascular abnormality.    Current Outpatient Prescriptions  Medication Sig Dispense Refill  . cetirizine (ZYRTEC) 10 MG tablet Take 10 mg by mouth daily.      [name] doxazosin (CARDURA) 8 MG tablet Take 8 mg by mouth at bedtime.      . fish oil-omega-3 fatty acids 1000 MG capsule Take 1,200 mg by mouth daily.      . furosemide (LASIX) 20 MG tablet Take 20 mg by mouth as needed.       [name] lisinopril (PRINIVIL,ZESTRIL) 2.5 MG tablet Take 2.5 mg by mouth 2 (two) times daily.      . metoprolol tartrate (LOPRESSOR) 25 MG tablet Take 12.5 mg by mouth daily.       [name]  Polyethyl [name] (SYSTANE OP) Apply 1 drop to eye daily.      . propafenone (RYTHMOL) 225 MG tablet Take 225 mg by mouth 3 (three) times daily.      . psyllium (METAMUCIL) 58.6 % powder Take 1 packet by mouth daily.      . rivaroxaban (XARELTO) 10 MG TABS tablet Take 20 mg by mouth daily.      . simvastatin (ZOCOR) 5 MG tablet Take 5 mg by mouth at bedtime.      . Vitamin D, Ergocalciferol, ([name]) 50000 UNITS CAPS Take 50,000 Units by mouth every 7 (seven) days.       No current facility-administered medications for this visit.    Allergies:    Allergies  Allergen Reactions  . Prednisone Swelling, Other (See Comments) and Hypertension    Tachycardia  . Doxycycline Rash    Social History:  The patient   reports that he quit smoking about 49 years ago. His smoking use included Cigarettes. He smoked 0.00 packs per day. He has never used smokeless tobacco. He reports that he does not drink alcohol or use illicit drugs.   Family history:   Family History  Problem Relation Age of Onset  . Heart failure Brother     ROS:  Please see the history of present illness.  All other systems reviewed and negative.   PHYSICAL EXAM: VS:  BP 118/72  Pulse 61  Ht 5' 11.5" (1.816 m)  Wt 197 lb (89.359 kg)  BMI 27.1 kg/m2 Well nourished, well developed, in no acute distress HEENT: Pupils are equal round react to light accommodation extraocular movements are intact.  conjuctiva appear pale.  Good capillary refill.   Neck: no JVDNo cervical lymphadenopathy. Cardiac: irregular rate and rhythm without murmurs rubs or gallops. Lungs:  clear to auscultation bilaterally, no wheezing, rhonchi or rales Abd: soft, nontender, positive bowel sounds all quadrants Ext: no lower extremity edema.  2+ radial and dorsalis pedis pulses. Skin: warm and dry Neuro:  Grossly normal  EKG:   atrialfibrillation controlled ventricular rate 61 beats per minute  ASSESSMENT AND PLAN:  Problem List Items Addressed This Visit   Paroxysmal atrial fibrillation     Patient may be experiencing intermittent tachybradycardia syndrome. He noticed this past Sunday he heard feeling poorly his heart rate between 100 and 120. He took Lopressor 12.5 mg his heart rate decreased into the 40s. Also check his blood pressure was 86/53. Today his heart rate has improved his blood pressure is improved as well. Going to set him up for a CardioNet monitor for the next 2 weeks to see the extent of his bradycardia and tachycardia. He is taking past. I asked him to use his Lopressor 12.5 mg on a when necessary basis should his heart rate. Over 100 for a prolonged period symptoms. He may need repeat cardioversion and/or consideration for pacemaker implant.  I  am also check a CBC as he looked a little pale and a basic metabolic panel.    Relevant Medications      propafenone (RYTHMOL) 225 MG tablet   Hypotension     Blood pressure has improved as of now been in the office. Also did check orthostatic blood pressures and he appropriate responses to change in position. Lowest blood pressure was 100/80 and highest was 110/82.    Relevant Medications      propafenone (RYTHMOL) 225 MG tablet   Hyperlipidemia     Currently treated with simvastatin. Last lipid panel one  year ago LDL 63 HDL 45 total cholesterol 129 triglycerides 14    Relevant Medications      propafenone (RYTHMOL) 225 MG tablet    Other Visit Diagnoses   Coronary atherosclerosis of native coronary artery    -  Primary    Relevant Medications       propafenone (RYTHMOL) 225 MG tablet    Other Relevant Orders       EKG 12-Lead       CBC       Basic Metabolic Panel (BMET)    Atrial fibrillation        Relevant Medications       propafenone (RYTHMOL) 225 MG tablet    Other Relevant Orders       CBC       Basic Metabolic Panel (BMET)       Cardiac event monitor

## 2013-06-20 NOTE — Assessment & Plan Note (Signed)
Blood pressure has improved as of now been in the office. Also did check orthostatic blood pressures and he appropriate responses to change in position. Lowest blood pressure was 100/80 and highest was 110/82.

## 2013-06-29 ENCOUNTER — Encounter (HOSPITAL_COMMUNITY): Payer: Self-pay | Admitting: *Deleted

## 2013-06-29 ENCOUNTER — Inpatient Hospital Stay (HOSPITAL_COMMUNITY)
Admission: EM | Admit: 2013-06-29 | Discharge: 2013-06-30 | DRG: 310 | Disposition: A | Discharge status: Home / Self Care | Payer: Medicare Other | Attending: Internal Medicine | Admitting: Internal Medicine

## 2013-06-29 ENCOUNTER — Emergency Department (HOSPITAL_COMMUNITY): Payer: Medicare Other

## 2013-06-29 DIAGNOSIS — J301 Allergic rhinitis due to pollen: Secondary | ICD-10-CM | POA: Diagnosis present

## 2013-06-29 DIAGNOSIS — I129 Hypertensive chronic kidney disease with stage 1 through stage 4 chronic kidney disease, or unspecified chronic kidney disease: Secondary | ICD-10-CM | POA: Diagnosis present

## 2013-06-29 DIAGNOSIS — G609 Hereditary and idiopathic neuropathy, unspecified: Secondary | ICD-10-CM | POA: Diagnosis present

## 2013-06-29 DIAGNOSIS — I471 Supraventricular tachycardia: Secondary | ICD-10-CM

## 2013-06-29 DIAGNOSIS — Z79899 Other long term (current) drug therapy: Secondary | ICD-10-CM

## 2013-06-29 DIAGNOSIS — I480 Paroxysmal atrial fibrillation: Secondary | ICD-10-CM | POA: Diagnosis present

## 2013-06-29 DIAGNOSIS — E559 Vitamin D deficiency, unspecified: Secondary | ICD-10-CM | POA: Diagnosis present

## 2013-06-29 DIAGNOSIS — I2510 Atherosclerotic heart disease of native coronary artery without angina pectoris: Secondary | ICD-10-CM | POA: Diagnosis present

## 2013-06-29 DIAGNOSIS — Z888 Allergy status to other drugs, medicaments and biological substances status: Secondary | ICD-10-CM

## 2013-06-29 DIAGNOSIS — I4891 Unspecified atrial fibrillation: Secondary | ICD-10-CM | POA: Diagnosis present

## 2013-06-29 DIAGNOSIS — N189 Chronic kidney disease, unspecified: Secondary | ICD-10-CM | POA: Diagnosis present

## 2013-06-29 DIAGNOSIS — Z7901 Long term (current) use of anticoagulants: Secondary | ICD-10-CM

## 2013-06-29 DIAGNOSIS — I4892 Unspecified atrial flutter: Secondary | ICD-10-CM

## 2013-06-29 DIAGNOSIS — Z881 Allergy status to other antibiotic agents status: Secondary | ICD-10-CM

## 2013-06-29 DIAGNOSIS — I509 Heart failure, unspecified: Secondary | ICD-10-CM | POA: Diagnosis present

## 2013-06-29 DIAGNOSIS — E785 Hyperlipidemia, unspecified: Secondary | ICD-10-CM | POA: Diagnosis present

## 2013-06-29 DIAGNOSIS — Z87891 Personal history of nicotine dependence: Secondary | ICD-10-CM

## 2013-06-29 DIAGNOSIS — IMO0001 Reserved for inherently not codable concepts without codable children: Secondary | ICD-10-CM

## 2013-06-29 DIAGNOSIS — I498 Other specified cardiac arrhythmias: Secondary | ICD-10-CM | POA: Diagnosis present

## 2013-06-29 DIAGNOSIS — I959 Hypotension, unspecified: Secondary | ICD-10-CM | POA: Diagnosis present

## 2013-06-29 DIAGNOSIS — N400 Enlarged prostate without lower urinary tract symptoms: Secondary | ICD-10-CM | POA: Diagnosis present

## 2013-06-29 HISTORY — DX: Encounter for observation for other suspected diseases and conditions ruled out: Z03.89

## 2013-06-29 LAB — CBC WITH DIFFERENTIAL/PLATELET
Lymphocytes Relative: 14 % (ref 12–46)
Lymphs Abs: 0.7 10*3/uL (ref 0.7–4.0)
Neutro Abs: 3.9 10*3/uL (ref 1.7–7.7)
Neutrophils Relative %: 76 % (ref 43–77)
Platelets: 127 10*3/uL — ABNORMAL LOW (ref 150–400)
RBC: 4.98 MIL/uL (ref 4.22–5.81)
WBC: 5.1 10*3/uL (ref 4.0–10.5)

## 2013-06-29 LAB — MRSA PCR SCREENING: MRSA by PCR: NEGATIVE

## 2013-06-29 LAB — COMPREHENSIVE METABOLIC PANEL
ALT: 10 U/L (ref 0–53)
AST: 15 U/L (ref 0–37)
Alkaline Phosphatase: 58 U/L (ref 39–117)
CO2: 26 mEq/L (ref 19–32)
GFR calc Af Amer: 78 mL/min — ABNORMAL LOW (ref >90)
GFR calc non Af Amer: 68 mL/min — ABNORMAL LOW (ref >90)
Glucose, Bld: 112 mg/dL — ABNORMAL HIGH (ref 70–99)
Potassium: 4.8 mEq/L (ref 3.5–5.1)
Sodium: 140 mEq/L (ref 135–145)

## 2013-06-29 MED ORDER — DOXAZOSIN MESYLATE 8 MG PO TABS
8.0000 mg | ORAL_TABLET | Freq: Every day | ORAL | Status: DC
  Administered 2013-06-29: 8 mg via ORAL
  Filled 2013-06-29 (×2): qty 1

## 2013-06-29 MED ORDER — SODIUM CHLORIDE 0.9 % IV SOLN
INTRAVENOUS | Status: DC
Start: 2013-06-29 — End: 2013-06-30

## 2013-06-29 MED ORDER — DILTIAZEM HCL 100 MG IV SOLR
5.0000 mg/h | INTRAVENOUS | Status: DC
  Administered 2013-06-29: 5 mg/h via INTRAVENOUS
  Filled 2013-06-29: qty 100

## 2013-06-29 MED ORDER — RIVAROXABAN 20 MG PO TABS
20.0000 mg | ORAL_TABLET | Freq: Every day | ORAL | Status: DC
  Administered 2013-06-29 – 2013-06-30 (×2): 20 mg via ORAL
  Filled 2013-06-29 (×2): qty 1

## 2013-06-29 MED ORDER — METOPROLOL TARTRATE 12.5 MG HALF TABLET
12.5000 mg | ORAL_TABLET | Freq: Every day | ORAL | Status: DC
  Administered 2013-06-29: 12.5 mg via ORAL
  Filled 2013-06-29 (×2): qty 1

## 2013-06-29 MED ORDER — FUROSEMIDE 40 MG PO TABS: 20.0000 mg | ORAL_TABLET | ORAL | Status: DC | PRN

## 2013-06-29 MED ORDER — SODIUM CHLORIDE 0.9 % IJ SOLN: 3.0000 mL | INTRAMUSCULAR | Status: DC | PRN

## 2013-06-29 MED ORDER — SODIUM CHLORIDE 0.9 % IV SOLN
250.0000 mL | INTRAVENOUS | Status: DC
  Administered 2013-06-29: 10 mL via INTRAVENOUS

## 2013-06-29 MED ORDER — TRAZODONE 25 MG HALF TABLET
25.0000 mg | ORAL_TABLET | Freq: Once | ORAL | Status: AC
  Administered 2013-06-30: 25 mg via ORAL
  Filled 2013-06-29: qty 1

## 2013-06-29 MED ORDER — SODIUM CHLORIDE 0.9 % IJ SOLN
3.0000 mL | Freq: Two times a day (BID) | INTRAMUSCULAR | Status: DC
  Administered 2013-06-29: 3 mL via INTRAVENOUS

## 2013-06-29 MED ORDER — HYDROCORTISONE 1 % EX CREA
1.0000 "application " | TOPICAL_CREAM | Freq: Three times a day (TID) | CUTANEOUS | Status: DC | PRN
  Filled 2013-06-29: qty 28

## 2013-06-29 MED ORDER — ACETAMINOPHEN 325 MG PO TABS: 650.0000 mg | ORAL_TABLET | ORAL | Status: DC | PRN

## 2013-06-29 MED ORDER — LISINOPRIL 2.5 MG PO TABS
2.5000 mg | ORAL_TABLET | Freq: Every day | ORAL | Status: DC | PRN
  Filled 2013-06-29: qty 1

## 2013-06-29 MED ORDER — ONDANSETRON HCL 4 MG/2ML IJ SOLN: 4.0000 mg | Freq: Four times a day (QID) | INTRAMUSCULAR | Status: DC | PRN

## 2013-06-29 MED ORDER — SIMVASTATIN 5 MG PO TABS
5.0000 mg | ORAL_TABLET | Freq: Every day | ORAL | Status: DC
  Administered 2013-06-29: 5 mg via ORAL
  Filled 2013-06-29 (×2): qty 1

## 2013-06-29 NOTE — Consult Note (Signed)
ELECTROPHYSIOLOGY CONSULT NOTE  Patient ID: Stanley Waters MRN: [id number], DOB/AGE: [birth year]   Admit date: 06/29/2013 Date of Consult: 06/30/2013  Primary Physician: Vernie Ammons, MD  Primary Cardiologist: Nanetta Batty, MD Reason for Consultation: Atrial flutter  History of Present Illness JIOVANNI Waters is a 78 y.o. male with known PAF (diagnosed in 2002) s/p TEE-guided DCCV Sept 2013, currently on Rythmol, who presented to [facility] ED with palpitations and near syncope. He has been feeling poorly since last Sunday when he started to have racing palpitations and dizziness. He felt he was out of rhythm again and started monitoring his pulse and BP at home. He notes rates increasing into 120s with minimal activity. His SBP was in the 80s. He denies CP or SOB. He denies syncope. He denies LE swelling, orthopnea or PND. Of note, Dr. Allyson Sabal has adjusted his medications in the past due to bradycardia (h/o rates in 40s). He is currently wearing a CardioNet monitor.   Past Medical History Past Medical History  Diagnosis Date  . Hypertension   . Coronary artery disease   . Hyperlipemia   . Seasonal allergies   . Enlarged prostate     BPH - PSA of 6.7 this is consistant with his last PSA of 6.3  . Peripheral neuropathy   . CHF (congestive heart failure)   . Intestinal polyps 09/22/11    Colonoscopy removed a 2 mm sessile cecal polyp with a cold snare.  . Tinnitus   . Measles   . Mumps   . Pneumonia   . Inguinal hernia     Inguinal hernia repair (x) 2 1991, 2011  . Umbilical hernia   . Vitamin D deficiency   . Chronic renal failure     Stage 2 in 11/2009  . Thrombocytopenia 2011    Very mild with platelets of 135,000.  [name] Atrial fibrillation     Transesophageal echo on 07/07/12 - EF =50-55%. Mild atheromatous disease of the proximal descending aorta. Cadioversion 07/07/12 successful.  . Atypical chest pain     Myoview performed 07/23/11 was completely normal. Post stress EF 61%.  .  Bruit     Left asymptomatic bruit. Carotid Duplex 12/13/12 =mildly abnormal. *BILATERAL BULB/PROXIMAL ICAs: Demonstrated a mild amount of fibrous plaque w/no evidence od significant diameter reduction, tortuosity or other vascular abnormality.    Past Surgical History Past Surgical History  Procedure Laterality Date  . Tee without cardioversion  07/07/2012    Procedure: TRANSESOPHAGEAL ECHOCARDIOGRAM (TEE);  Surgeon: Chrystie Nose, MD;  Location: [facility] ENDOSCOPY;  Service: Cardiovascular;  Laterality: N/A;  . Cardioversion  07/07/2012    A fib - Cardioversion successful.  . Colonoscopy w/ polypectomy  09/22/11    2 mm sessile cecal polyp was removed with a cold snare.  . Inguinal hernia repair  1991 & 2011  . Cardiac catheterization  2002    "Normal cardiac catheterization"  . Carotid duplex  12/13/12    For asymptomatic bruit. Mildly abnormal.  *BILATERAL BULB/PROXIMAL ICAs: Demonstrated a mild amount of fibrous plaque w/no evidence of significant diameter reduction, tortuosity or other vascular abnormality.     Allergies/Intolerances Allergies  Allergen Reactions  . Prednisone Swelling, Other (See Comments) and Hypertension    Tachycardia  . Doxycycline Rash   Current Home Medications   Medication List    ASK your doctor about these medications       cetirizine 10 MG tablet  Commonly known as:  ZYRTEC  Take 10 mg by mouth  as needed for allergies.     doxazosin 8 MG tablet  Commonly known as:  CARDURA  Take 8 mg by mouth at bedtime.     furosemide 20 MG tablet  Commonly known as:  LASIX  Take 20 mg by mouth as needed for fluid.     ICAPS Caps  Take 1 capsule by mouth daily.     lisinopril 2.5 MG tablet  Commonly known as:  PRINIVIL,ZESTRIL  Take 2.5 mg by mouth daily as needed (blood pressure).     metoprolol tartrate 25 MG tablet  Commonly known as:  LOPRESSOR  Take 12.5 mg by mouth daily.     OMEGA-3 KRILL OIL PO  Take 1 capsule by mouth daily.     propafenone  225 MG tablet  Commonly known as:  RYTHMOL  Take 225 mg by mouth 3 (three) times daily.     psyllium 58.6 % powder  Commonly known as:  METAMUCIL  Take 1 packet by mouth as needed (constipation).     rivaroxaban 10 MG Tabs tablet  Commonly known as:  XARELTO  Take 20 mg by mouth daily.     simvastatin 5 MG tablet  Commonly known as:  ZOCOR  Take 5 mg by mouth at bedtime.     SYSTANE OP  Place 1 drop into both eyes daily.     Vitamin D (Ergocalciferol) 50000 UNITS Caps capsule  Commonly known as:  [name]  Take 50,000 Units by mouth every 7 (seven) days.       Family History Family History  Problem Relation Age of Onset  . Heart failure Brother     Social History History   Social History  . Marital Status: Widowed    Spouse Name: N/A    Number of Children: 2  . Years of Education: N/A   Occupational History  .      [employer] representative   Social History Main Topics  . Smoking status: Former Smoker    Types: Cigarettes    Quit date: 11/03/1963  . Smokeless tobacco: Never Used  . Alcohol Use: No  . Drug Use: No  . Sexual Activity: Not on file   Other Topics Concern  . Not on file   Social History Narrative  . No narrative on file   Review of Systems General: No chills, fever, night sweats or weight changes  Cardiovascular:  No chest pain, dyspnea on exertion, edema, orthopnea, palpitations, paroxysmal nocturnal dyspnea Dermatological: No rash, lesions or masses Respiratory: No cough, dyspnea Urologic: No hematuria, dysuria Abdominal: No nausea, vomiting, diarrhea, bright red blood per rectum, melena, or hematemesis Neurologic: No visual changes, weakness, changes in mental status All other systems reviewed and are otherwise negative except as noted above.  Physical Exam Vitals: Blood pressure 111/71, pulse 69, temperature 98.6 F (37 C), temperature source Oral, resp. rate 17, height 5' 11.5" (1.816 m), weight 189 lb 2.5 oz (85.8 kg), SpO2 92.00%.    General: Well developed, well appearing 78 y.o. male in no acute distress. HEENT: Normocephalic, atraumatic. EOMs intact. Sclera nonicteric. Oropharynx clear.  Neck: Supple without bruits. No JVD. Lungs: Respirations regular and unlabored, CTA bilaterally. No wheezes, rales or rhonchi. Heart: Tachycardic, regular. S1, S2 present. No murmurs, rub, S3 or S4. Abdomen: Soft, non-tender, non-distended. BS present x 4 quadrants. No hepatosplenomegaly.  Extremities: No clubbing, cyanosis or edema. DP/PT/Radials 2+ and equal bilaterally. Psych: Normal affect. Neuro: Alert and oriented X 3. Moves all extremities spontaneously. Musculoskeletal: No kyphosis. Skin:  Intact. Warm and dry. No rashes or petechiae in exposed areas.   Labs Lab Results  Component Value Date   WBC 5.9 06/30/2013   HGB 15.4 06/30/2013   HCT 43.0 06/30/2013   MCV 92.3 06/30/2013   PLT 129* 06/30/2013     Recent Labs Lab 06/29/13 1032 06/30/13 0422  NA 140 139  K 4.8 3.8  CL 105 103  CO2 26 26  BUN 16 17  CREATININE 1.04 1.03  CALCIUM 9.2 8.6  PROT 6.8  --   BILITOT 1.1  --   ALKPHOS 58  --   ALT 10  --   AST 15  --   GLUCOSE 112* 99    Radiology/Studies Dg Chest 2 View 06/29/2013   *RADIOLOGY REPORT*  Clinical Data: Chest pain.  CHEST - 2 VIEW  Comparison: Chest x-ray 08/17/2012.  Findings: Lung volumes are slightly low.  No acute consolidative airspace disease.  No pleural effusions.  There is some mild bibasilar scarring.  Mild diffuse peribronchial cuffing, similar to prior studies.  No evidence of pulmonary edema.  Heart size is normal.  Upper mediastinal contours are within normal limits. Atherosclerosis in the thoracic aorta.  IMPRESSION: 1.  Low lung volumes without definite radiographic evidence of acute cardiopulmonary disease. 2.  Mild diffuse peribronchial cuffing is similar to prior studies, favored to represent chronic bronchitis in this patient. 3.  Atherosclerosis.   Original Report Authenticated By:  Trudie Reed, M.D.   Transesophageal echocardiogram Sept 2013 Study Conclusions - Left ventricle: There was mild concentric hypertrophy. Systolic function was low normal. The estimated ejection fraction was in the range of 50% to 55%. - Aortic valve: No evidence of vegetation. - Aorta: Mild atheromatous disease of the proximal descending aorta. - Mitral valve: No evidence of vegetation. - Left atrium: The atrium was dilated. No evidence of thrombus in the atrial cavity or appendage. - Pulmonary veins: Dilated. No anomaly. Systolic dominant flow. - Right atrium: No evidence of thrombus in the atrial cavity or appendage. - Atrial septum: No defect or patent foramen ovale was identified by color doppler or saline microbubble contrast. - Tricuspid valve: No evidence of vegetation. Trivial regurgitation. - Pulmonic valve: No evidence of vegetation.  12-lead ECG on presentation to ED shows a narrow complex rhythm with regular, grouped beating, difficult to discern P waves Repeat 12-lead ECG while in ED shows a regular, narrow complex tachycardia at 117 bpm, suspect atypical atrial flutter  Assessment and Plan 1. Probable atypical atrial flutter 2. PAF 3. History of bradycardia  Mr. Kohan presents with tachypalpitations and near syncope. His 12-lead ECG appears most consistent with atypical atrial flutter. His rate has improved with IV diltiazem. Rythmol has been discontinued. He has normal LVEF. Further recommendations regarding AAD therapy per Dr. Johney Frame.  Dr. Johney Frame to see. Please see recommendations below. Signed, Rick Duff, PA-C 06/30/2013, 7:49 AM  I have seen, examined the patient, and reviewed the above assessment and plan.  Changes to above are made where necessary.  Pt with a h/o recurrent atrial flutter (not clearly typical by ekg).  He also carries a diagnosis of afib.  Presently, he is in atrial flutter with RVR. Therapeutic strategies for afib and atrial  flutter including medicine and ablation were discussed in detail with the patient today. Risk, benefits, and alternatives to each were discussed in detail.  He is very clear that he does not wish to consider ablation at this time.  We discussed tikosyn vs amiodarone as medicine options.  Risks, benefits, and alternatives to each of these was discussed with the patient.  At this time, he is clear that he would prefer amiodarone. I would therefore recommend amiodarone as an alternative to his previous therapy of rhythmol. He should continue long term anticoagulation.  If he continues to have atrial arrhythmias despite medical therapy then he may be willing to consider ablation in the future. He will proceed with cardioversion by Dr Rennis Golden later today.  I will see as needed going forward.  Co Sign: Hillis Range, MD 06/30/2013 8:07 AM

## 2013-06-29 NOTE — ED Notes (Signed)
Reports has been having intermittent episodes of feeling lightheaded, dizzy, diaphoretic, SOB & "heart feeling funny" x 2 weeks. Saw cardiologist 1.5 weeks ago & has a holter monitor on. States today was worst episode & felt faint. Denies all symptoms now. "I feel fine now".

## 2013-06-29 NOTE — ED Notes (Signed)
Pt reports near syncopal episode 0900. Denies any CP. Denies any symptoms upon arrival to ED.

## 2013-06-29 NOTE — Consult Note (Deleted)
Reason for Consult: Symptomatic A-fib w/ RVR Referring Physician: [facility] ER Physician   HPI:  The patient is a 78 y/o male, followed by Dr. Berry. He has a history of normal coronary arteries by cath in 2002. He has PAF and takes Rythmol and Lopressor. He takes Xarelto for AC. His Rythmol and Lopressor were reduced recently by Dr. Berry due to bradycardia. He states that he had HRs in the 40s. He now takes 225 mg of Rythmol TID and 12.5 mg of Lopressor BID.  His other problems include hypertension and hyperlipidemia. He presented to the [facility] ER today for evaluation of palpitations and near syncope. He first developed symptoms yesterday. He had palpations. He checked his pulse and noted that it was in the 130s, however he felt that his rhythm was regular. He then took an extra dose of Lopressor and his symptoms resolved. This morning his palpations returned, along with dizziness and near syncope. He denies chest pain and SOB. He checked his pulse again today and his HR was 114. His SBP was in the 80s. He called 911 and was transported to [facility]. He was found to be in atrial fibrillation. However, on arrival, he had a controlled HR in the 80s. He is now asymptomatic. He reports daily compliance with his mediations. He denies any recent illnesses. He also denies any recent excessive caffeine and alcohol intake. No history of OSA.  Past Medical History  Diagnosis Date  . Hypertension   . Coronary artery disease   . Hyperlipemia   . Seasonal allergies   . Enlarged prostate     BPH - PSA of 6.7 this is consistant with his last PSA of 6.3  . Peripheral neuropathy   . CHF (congestive heart failure)   . Intestinal polyps 09/22/11    Colonoscopy removed a 2 mm sessile cecal polyp with a cold snare.  . Tinnitus   . Measles   . Mumps   . Pneumonia   . Inguinal hernia     Inguinal hernia repair (x) 2 1991, 2011  . Umbilical hernia   . Vitamin D deficiency   . Chronic renal failure     Stage 2 in 11/2009  .  Thrombocytopenia 2011    Very mild with platelets of 135,000.  . Atrial fibrillation     Transesophageal echo on 07/07/12 - EF =50-55%. Mild atheromatous disease of the proximal descending aorta. Cadioversion 07/07/12 successful.  . Atypical chest pain     Myoview performed 07/23/11 was completely normal. Post stress EF 61%.  . Bruit     Left asymptomatic bruit. Carotid Duplex 12/13/12 =mildly abnormal. *BILATERAL BULB/PROXIMAL ICAs: Demonstrated a mild amount of fibrous plaque w/no evidence od significant diameter reduction, tortuosity or other vascular abnormality.    Past Surgical History  Procedure Laterality Date  . Tee without cardioversion  07/07/2012    Procedure: TRANSESOPHAGEAL ECHOCARDIOGRAM (TEE);  Surgeon: [name]. [name], MD;  Location: [facility] ENDOSCOPY;  Service: Cardiovascular;  Laterality: N/A;  . Cardioversion  07/07/2012    A fib - Cardioversion successful.  . Colonoscopy w/ polypectomy  09/22/11    2 mm sessile cecal polyp was removed with a cold snare.  . Inguinal hernia repair  1991 & 2011  . Cardiac catheterization  2002    "Normal cardiac catheterization"  . Carotid duplex  12/13/12    For asymptomatic bruit. Mildly abnormal.  *BILATERAL BULB/PROXIMAL ICAs: Demonstrated a mild amount of fibrous plaque w/no evidence of significant diameter reduction, tortuosity or other   vascular abnormality.     Family History  Problem Relation Age of Onset  . Heart failure Brother     Social History:  reports that he quit smoking about 49 years ago. His smoking use included Cigarettes. He smoked 0.00 packs per day. He has never used smokeless tobacco. He reports that he does not drink alcohol or use illicit drugs.  Allergies:  Allergies  Allergen Reactions  . Prednisone Swelling, Other (See Comments) and Hypertension    Tachycardia  . Doxycycline Rash    Medications:  Prior to Admission medications   Medication Sig Start Date End Date Taking? Authorizing Provider  cetirizine  (ZYRTEC) 10 MG tablet Take 10 mg by mouth as needed for allergies.   Yes Historical Provider, MD  doxazosin (CARDURA) 8 MG tablet Take 8 mg by mouth at bedtime.   Yes Historical Provider, MD  furosemide (LASIX) 20 MG tablet Take 20 mg by mouth as needed for fluid.   Yes Historical Provider, MD  lisinopril (PRINIVIL,ZESTRIL) 2.5 MG tablet Take 2.5 mg by mouth daily as needed (blood pressure).    Yes Historical Provider, MD  metoprolol tartrate (LOPRESSOR) 25 MG tablet Take 12.5 mg by mouth daily.    Yes Historical Provider, MD  Multiple Vitamins-Minerals (ICAPS) CAPS Take 1 capsule by mouth daily.   Yes Historical Provider, MD  OMEGA-3 KRILL OIL PO Take 1 capsule by mouth daily.   Yes Historical Provider, MD  Polyethyl [name] (SYSTANE OP) Place 1 drop into both eyes daily.    Yes Historical Provider, MD  propafenone (RYTHMOL) 225 MG tablet Take 225 mg by mouth 3 (three) times daily. 07/07/12  Yes [name]. [name], MD  psyllium (METAMUCIL) 58.6 % powder Take 1 packet by mouth as needed (constipation).    Yes Historical Provider, MD  rivaroxaban (XARELTO) 10 MG TABS tablet Take 20 mg by mouth daily.   Yes Historical Provider, MD  simvastatin (ZOCOR) 5 MG tablet Take 5 mg by mouth at bedtime.   Yes Historical Provider, MD  Vitamin D, Ergocalciferol, ([name]) 50000 UNITS CAPS Take 50,000 Units by mouth every 7 (seven) days.   Yes Historical Provider, MD     Results for orders placed during the hospital encounter of 06/29/13 (from the past 48 hour(s))  CBC WITH DIFFERENTIAL     Status: Abnormal   Collection Time    06/29/13 10:32 AM      Result Value Range   WBC 5.1  4.0 - 10.5 K/uL   RBC 4.98  4.22 - 5.81 MIL/uL   Hemoglobin 16.3  13.0 - 17.0 g/dL   HCT 46.2  39.0 - 52.0 %   MCV 92.8  78.0 - 100.0 fL   MCH 32.7  26.0 - 34.0 pg   MCHC 35.3  30.0 - 36.0 g/dL   RDW 13.1  11.5 - 15.5 %   Platelets 127 (*) 150 - 400 K/uL   Neutrophils Relative % 76  43 - 77 %   Neutro Abs 3.9  1.7 -  7.7 K/uL   Lymphocytes Relative 14  12 - 46 %   Lymphs Abs 0.7  0.7 - 4.0 K/uL   Monocytes Relative 9  3 - 12 %   Monocytes Absolute 0.5  0.1 - 1.0 K/uL   Eosinophils Relative 1  0 - 5 %   Eosinophils Absolute 0.1  0.0 - 0.7 K/uL   Basophils Relative 0  0 - 1 %   Basophils Absolute 0.0  0.0 - 0.1   K/uL  COMPREHENSIVE METABOLIC PANEL     Status: Abnormal   Collection Time    06/29/13 10:32 AM      Result Value Range   Sodium 140  135 - 145 mEq/L   Potassium 4.8  3.5 - 5.1 mEq/L   Chloride 105  96 - 112 mEq/L   CO2 26  19 - 32 mEq/L   Glucose, Bld 112 (*) 70 - 99 mg/dL   BUN 16  6 - 23 mg/dL   Creatinine, Ser 1.04  0.50 - 1.35 mg/dL   Calcium 9.2  8.4 - 10.5 mg/dL   Total Protein 6.8  6.0 - 8.3 g/dL   Albumin 3.7  3.5 - 5.2 g/dL   AST 15  0 - 37 U/L   ALT 10  0 - 53 U/L   Alkaline Phosphatase 58  39 - 117 U/L   Total Bilirubin 1.1  0.3 - 1.2 mg/dL   GFR calc non Af Amer 68 (*) >90 mL/min   GFR calc Af Amer 78 (*) >90 mL/min   Comment: (NOTE)     The eGFR has been calculated using the CKD EPI equation.     This calculation has not been validated in all clinical situations.     eGFR's persistently <90 mL/min signify possible Chronic Kidney     Disease.  POCT I-STAT TROPONIN I     Status: None   Collection Time    06/29/13 10:52 AM      Result Value Range   Troponin i, poc 0.01  0.00 - 0.08 ng/mL   Comment 3            Comment: Due to the release kinetics of cTnI,     a negative result within the first hours     of the onset of symptoms does not rule out     myocardial infarction with certainty.     If myocardial infarction is still suspected,     repeat the test at appropriate intervals.    Dg Chest 2 View  06/29/2013   *RADIOLOGY REPORT*  Clinical Data: Chest pain.  CHEST - 2 VIEW  Comparison: Chest x-ray 08/17/2012.  Findings: Lung volumes are slightly low.  No acute consolidative airspace disease.  No pleural effusions.  There is some mild bibasilar scarring.  Mild  diffuse peribronchial cuffing, similar to prior studies.  No evidence of pulmonary edema.  Heart size is normal.  Upper mediastinal contours are within normal limits. Atherosclerosis in the thoracic aorta.  IMPRESSION: 1.  Low lung volumes without definite radiographic evidence of acute cardiopulmonary disease. 2.  Mild diffuse peribronchial cuffing is similar to prior studies, favored to represent chronic bronchitis in this patient. 3.  Atherosclerosis.   Original Report Authenticated By: Daniel Entrikin, M.D.    Review of Systems  Constitutional: Positive for malaise/fatigue and diaphoresis.  Respiratory: Negative for shortness of breath.   Cardiovascular: Positive for palpitations. Negative for chest pain.  Neurological: Positive for dizziness. Negative for loss of consciousness.  Endo/Heme/Allergies: Does not bruise/bleed easily.  All other systems reviewed and are negative.   Blood pressure 132/77, pulse 114, temperature 97.7 F (36.5 C), temperature source Oral, resp. rate 17, height 5' 11" (1.803 m), weight 190 lb (86.183 kg), SpO2 98.00%. Physical Exam  Constitutional: He is oriented to person, place, and time. He appears well-developed and well-nourished. No distress.  Neck: No JVD present. Carotid bruit is not present.  Cardiovascular: Normal rate and intact distal pulses.  An   irregularly irregular rhythm present. Exam reveals friction rub. Exam reveals no gallop.   No murmur heard. Pulses:      Radial pulses are 2+ on the right side, and 2+ on the left side.       Dorsalis pedis pulses are 2+ on the right side, and 2+ on the left side.  Respiratory: Effort normal and breath sounds normal. No respiratory distress. He has no wheezes. He has no rales.  GI: Soft. Bowel sounds are normal. He exhibits no distension and no mass. There is no tenderness.  Musculoskeletal: He exhibits no edema.  Neurological: He is alert and oriented to person, place, and time.  Skin: Skin is warm and dry.  He is not diaphoretic.  Psychiatric: He has a normal mood and affect. His behavior is normal.    Assessment/Plan: Principal Problem:   Atrial fibrillation with RVR Active Problems:   Paroxysmal atrial fibrillation   Hyperlipidemia   Chronic anticoagulation -Xarelto  Plan: Pt with h/o PAF, presents to [facility] ER with complaint of palpitations and near syncope, found to be in atrial fib with a controlled ventricular response. Pt notes that he has had rates between 114-130 bpm in the past 24 hours. He is now hemodynamically stable w/ HR in the 80s-90s and SBP in the 130s. His symptoms have resolved. He already has a 2 week event monitor, that was ordered by Bryan Hager, at that time of his last office visit, to evaluate for recurrence PAF. Since he has had recurrence of AF on Rythmol, we may need to consider switching to another antiarrhythmic or consider OP cardioversion. I do not feel that the patient needs admission, as he is now stable. MD to assess and will help to determine final plan.    Violetta Lavalle M. Cloud Graham, PA-C 06/29/2013, 12:37 PM    

## 2013-06-29 NOTE — H&P (Signed)
Pt. Seen and examined. Agree with the NP/PA-C note as written.  Known to me from TEE/Cardioversion in 07/2012. He was having breakthrough AF at the time and I increased his Rythmol to 300 mg q8hrs. He was on this for some time and then was noted to be bradycardic. Dr. Allyson Sabal decreased his dose to 225 mg q8hrs and changed him from Eliquis to Xarelto for cost reasons - he remained anticoagulated the whole time. 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 This resolved. Then he noticed today his heart was out of rhythm, he felt dizzy and pre-syncopal and was hypotensive, sweaty and clammy. He came to the ER and his symptoms improved. The initial EKG demonstrates group beating without clear atrial activity.  He is now persistently tachycardic, however, his blood pressure has improved. 12-lead EKG does demonstrate probably atrial flutter or ectopic atrial tachycardia, noting his is on Rythmol. Although he feels better, we cannot leave him in this rhythm.   IMPRESSION: 1. Symptomatic atrial flutter or ectopic atrial tachycardia, on Rythmol 2. Pre-syncope with hypotension 3. History of bradycardia  PLAN: 1. Admit for rate control - can use cardizem. I think early cardioversion would be indicated for him. 2. Ask EP to consult for their input today.  Will stop Rythmol, wash out and consider alternative antiarrythmic such as Tikosyn, ?ablation evaluation. 3. Continue xarelto.   Chrystie Nose, MD, [facility] Attending Cardiologist [facility]

## 2013-06-29 NOTE — ED Notes (Signed)
EKG given to Cardiologist and Cardiology PA who ordered EKG.

## 2013-06-29 NOTE — ED Notes (Signed)
Cardiology MD at bedside.

## 2013-06-29 NOTE — ED Provider Notes (Signed)
CSN: [id number]     Arrival date & time 06/29/13  4098 History   First MD Initiated Contact with Patient 06/29/13 1013     Chief Complaint  Patient presents with  . Near Syncope   (Consider location/radiation/quality/duration/timing/severity/associated sxs/prior Treatment) The history is provided by the patient.   patient here after having near syncopal event when he was standing up. No palpitations associated with tachycardia and irregular heartbeat. Patient states that he has a history of atrial fibrillation and states she's been having trouble with increased heart rate x2 days going up to 130 denies any syncope. Denies any chest pain but was diaphoretic with this. Denies any recent vomiting or diarrhea. No black or bloody stools. No recent changes to his medications. Denies any cough or shortness of breath. States he feels back to his baseline. Denies anginal type chest  Past Medical History  Diagnosis Date  . Hypertension   . Coronary artery disease   . Hyperlipemia   . Seasonal allergies   . Enlarged prostate     BPH - PSA of 6.7 this is consistant with his last PSA of 6.3  . Peripheral neuropathy   . CHF (congestive heart failure)   . Intestinal polyps 09/22/11    Colonoscopy removed a 2 mm sessile cecal polyp with a cold snare.  . Tinnitus   . Measles   . Mumps   . Pneumonia   . Inguinal hernia     Inguinal hernia repair (x) 2 1991, 2011  . Umbilical hernia   . Vitamin D deficiency   . Chronic renal failure     Stage 2 in 11/2009  . Thrombocytopenia 2011    Very mild with platelets of 135,000.  [name] Atrial fibrillation     Transesophageal echo on 07/07/12 - EF =50-55%. Mild atheromatous disease of the proximal descending aorta. Cadioversion 07/07/12 successful.  . Atypical chest pain     Myoview performed 07/23/11 was completely normal. Post stress EF 61%.  . Bruit     Left asymptomatic bruit. Carotid Duplex 12/13/12 =mildly abnormal. *BILATERAL BULB/PROXIMAL ICAs: Demonstrated  a mild amount of fibrous plaque w/no evidence od significant diameter reduction, tortuosity or other vascular abnormality.   Past Surgical History  Procedure Laterality Date  . Tee without cardioversion  07/07/2012    Procedure: TRANSESOPHAGEAL ECHOCARDIOGRAM (TEE);  Surgeon: Chrystie Nose, MD;  Location: [facility] ENDOSCOPY;  Service: Cardiovascular;  Laterality: N/A;  . Cardioversion  07/07/2012    A fib - Cardioversion successful.  . Colonoscopy w/ polypectomy  09/22/11    2 mm sessile cecal polyp was removed with a cold snare.  . Inguinal hernia repair  1991 & 2011  . Cardiac catheterization  2002    "Normal cardiac catheterization"  . Carotid duplex  12/13/12    For asymptomatic bruit. Mildly abnormal.  *BILATERAL BULB/PROXIMAL ICAs: Demonstrated a mild amount of fibrous plaque w/no evidence of significant diameter reduction, tortuosity or other vascular abnormality.    Family History  Problem Relation Age of Onset  . Heart failure Brother    History  Substance Use Topics  . Smoking status: Former Smoker    Types: Cigarettes    Quit date: 11/03/1963  . Smokeless tobacco: Never Used  . Alcohol Use: No    Review of Systems  All other systems reviewed and are negative.    Allergies  Prednisone and Doxycycline  Home Medications   Current Outpatient Rx  Name  Route  Sig  Dispense  Refill  .  cetirizine (ZYRTEC) 10 MG tablet   Oral   Take 10 mg by mouth daily.         [name] doxazosin (CARDURA) 8 MG tablet   Oral   Take 8 mg by mouth at bedtime.         . fish oil-omega-3 fatty acids 1000 MG capsule   Oral   Take 1,200 mg by mouth daily.         . furosemide (LASIX) 20 MG tablet   Oral   Take 20 mg by mouth as needed.          [name] lisinopril (PRINIVIL,ZESTRIL) 2.5 MG tablet   Oral   Take 2.5 mg by mouth 2 (two) times daily.         . metoprolol tartrate (LOPRESSOR) 25 MG tablet   Oral   Take 12.5 mg by mouth daily.          Bertram Gala [name]  (SYSTANE OP)   Ophthalmic   Apply 1 drop to eye daily.         . propafenone (RYTHMOL) 225 MG tablet   Oral   Take 225 mg by mouth 3 (three) times daily.         . psyllium (METAMUCIL) 58.6 % powder   Oral   Take 1 packet by mouth daily.         . rivaroxaban (XARELTO) 10 MG TABS tablet   Oral   Take 20 mg by mouth daily.         . simvastatin (ZOCOR) 5 MG tablet   Oral   Take 5 mg by mouth at bedtime.         . Vitamin D, Ergocalciferol, ([name]) 50000 UNITS CAPS   Oral   Take 50,000 Units by mouth every 7 (seven) days.          BP 135/82  Pulse 82  Temp(Src) 97.7 F (36.5 C) (Oral)  Resp 20  Ht 5\' 11"  (1.803 m)  Wt 190 lb (86.183 kg)  BMI 26.51 kg/m2  SpO2 99% Physical Exam  Nursing note and vitals reviewed. Constitutional: He is oriented to person, place, and time. He appears well-developed and well-nourished.  Non-toxic appearance. No distress.  HENT:  Head: Normocephalic and atraumatic.  Eyes: Conjunctivae, EOM and lids are normal. Pupils are equal, round, and reactive to light.  Neck: Normal range of motion. Neck supple. No tracheal deviation present. No mass present.  Cardiovascular: Normal heart sounds.  An irregular rhythm present. Exam reveals no gallop.   No murmur heard. Pulmonary/Chest: Effort normal and breath sounds normal. No stridor. No respiratory distress. He has no decreased breath sounds. He has no wheezes. He has no rhonchi. He has no rales.  Abdominal: Soft. Normal appearance and bowel sounds are normal. He exhibits no distension. There is no tenderness. There is no rebound and no CVA tenderness.  Musculoskeletal: Normal range of motion. He exhibits no edema and no tenderness.  Neurological: He is alert and oriented to person, place, and time. He has normal strength. No cranial nerve deficit or sensory deficit. GCS eye subscore is 4. GCS verbal subscore is 5. GCS motor subscore is 6.  Skin: Skin is warm and dry. No abrasion and no  rash noted.  Psychiatric: He has a normal mood and affect. His speech is normal and behavior is normal.    ED Course  Procedures (including critical care time) Labs Review Labs Reviewed - No data to display Imaging Review No  results found.  MDM  No diagnosis found.  Date: 06/29/2013  Rate: 80  Rhythm: atrial fibrillation  QRS Axis: normal  Intervals: normal  ST/T Wave abnormalities: nonspecific ST changes  Conduction Disutrbances:none  Narrative Interpretation:   Old EKG Reviewed: unchanged    Pt seen by cardiology and will be admitted for observation  Toy Baker, MD 07/05/13 1604

## 2013-06-29 NOTE — ED Notes (Signed)
Patient transported to X-ray 

## 2013-06-29 NOTE — H&P (Signed)
Reason for Consult: Symptomatic A-fib w/ RVR Referring Physician: [facility] ER Physician   HPI:  The patient is a 78 y/o male, followed by Dr. Allyson Sabal. He has a history of normal coronary arteries by cath in 2002. He has PAF and takes Rythmol and Lopressor. He takes Xarelto for [facility]. His Rythmol and Lopressor were reduced recently by Dr. Allyson Sabal due to bradycardia. He states that he had HRs in the 40s. He now takes 225 mg of Rythmol TID and 12.5 mg of Lopressor BID.  His other problems include hypertension and hyperlipidemia. He presented to the [facility] ER today for evaluation of palpitations and near syncope. He first developed symptoms yesterday. He had palpations. He checked his pulse and noted that it was in the 130s, however he felt that his rhythm was regular. He then took an extra dose of Lopressor and his symptoms resolved. This morning his palpations returned, along with dizziness and near syncope. He denies chest pain and SOB. He checked his pulse again today and his HR was 114. His SBP was in the 80s. He called 911 and was transported to [facility]. He was found to be in atrial fibrillation. However, on arrival, he had a controlled HR in the 80s. He is now asymptomatic. He reports daily compliance with his mediations. He denies any recent illnesses. He also denies any recent excessive caffeine and alcohol intake. No history of OSA.  Past Medical History  Diagnosis Date  . Hypertension   . Coronary artery disease   . Hyperlipemia   . Seasonal allergies   . Enlarged prostate     BPH - PSA of 6.7 this is consistant with his last PSA of 6.3  . Peripheral neuropathy   . CHF (congestive heart failure)   . Intestinal polyps 09/22/11    Colonoscopy removed a 2 mm sessile cecal polyp with a cold snare.  . Tinnitus   . Measles   . Mumps   . Pneumonia   . Inguinal hernia     Inguinal hernia repair (x) 2 1991, 2011  . Umbilical hernia   . Vitamin D deficiency   . Chronic renal failure     Stage 2 in 11/2009  .  Thrombocytopenia 2011    Very mild with platelets of 135,000.  [name] Atrial fibrillation     Transesophageal echo on 07/07/12 - EF =50-55%. Mild atheromatous disease of the proximal descending aorta. Cadioversion 07/07/12 successful.  . Atypical chest pain     Myoview performed 07/23/11 was completely normal. Post stress EF 61%.  . Bruit     Left asymptomatic bruit. Carotid Duplex 12/13/12 =mildly abnormal. *BILATERAL BULB/PROXIMAL ICAs: Demonstrated a mild amount of fibrous plaque w/no evidence od significant diameter reduction, tortuosity or other vascular abnormality.    Past Surgical History  Procedure Laterality Date  . Tee without cardioversion  07/07/2012    Procedure: TRANSESOPHAGEAL ECHOCARDIOGRAM (TEE);  Surgeon: Chrystie Nose, MD;  Location: [facility] ENDOSCOPY;  Service: Cardiovascular;  Laterality: N/A;  . Cardioversion  07/07/2012    A fib - Cardioversion successful.  . Colonoscopy w/ polypectomy  09/22/11    2 mm sessile cecal polyp was removed with a cold snare.  . Inguinal hernia repair  1991 & 2011  . Cardiac catheterization  2002    "Normal cardiac catheterization"  . Carotid duplex  12/13/12    For asymptomatic bruit. Mildly abnormal.  *BILATERAL BULB/PROXIMAL ICAs: Demonstrated a mild amount of fibrous plaque w/no evidence of significant diameter reduction, tortuosity or other  vascular abnormality.     Family History  Problem Relation Age of Onset  . Heart failure Brother     Social History:  reports that he quit smoking about 49 years ago. His smoking use included Cigarettes. He smoked 0.00 packs per day. He has never used smokeless tobacco. He reports that he does not drink alcohol or use illicit drugs.  Allergies:  Allergies  Allergen Reactions  . Prednisone Swelling, Other (See Comments) and Hypertension    Tachycardia  . Doxycycline Rash    Medications:  Prior to Admission medications   Medication Sig Start Date End Date Taking? Authorizing Provider  cetirizine  (ZYRTEC) 10 MG tablet Take 10 mg by mouth as needed for allergies.   Yes Historical Provider, MD  doxazosin (CARDURA) 8 MG tablet Take 8 mg by mouth at bedtime.   Yes Historical Provider, MD  furosemide (LASIX) 20 MG tablet Take 20 mg by mouth as needed for fluid.   Yes Historical Provider, MD  lisinopril (PRINIVIL,ZESTRIL) 2.5 MG tablet Take 2.5 mg by mouth daily as needed (blood pressure).    Yes Historical Provider, MD  metoprolol tartrate (LOPRESSOR) 25 MG tablet Take 12.5 mg by mouth daily.    Yes Historical Provider, MD  Multiple Vitamins-Minerals (ICAPS) CAPS Take 1 capsule by mouth daily.   Yes Historical Provider, MD  OMEGA-3 KRILL OIL PO Take 1 capsule by mouth daily.   Yes Historical Provider, MD  Polyethyl [name] (SYSTANE OP) Place 1 drop into both eyes daily.    Yes Historical Provider, MD  propafenone (RYTHMOL) 225 MG tablet Take 225 mg by mouth 3 (three) times daily. 07/07/12  Yes Chrystie Nose, MD  psyllium (METAMUCIL) 58.6 % powder Take 1 packet by mouth as needed (constipation).    Yes Historical Provider, MD  rivaroxaban (XARELTO) 10 MG TABS tablet Take 20 mg by mouth daily.   Yes Historical Provider, MD  simvastatin (ZOCOR) 5 MG tablet Take 5 mg by mouth at bedtime.   Yes Historical Provider, MD  Vitamin D, Ergocalciferol, ([name]) 50000 UNITS CAPS Take 50,000 Units by mouth every 7 (seven) days.   Yes Historical Provider, MD     Results for orders placed during the hospital encounter of 06/29/13 (from the past 48 hour(s))  CBC WITH DIFFERENTIAL     Status: Abnormal   Collection Time    06/29/13 10:32 AM      Result Value Range   WBC 5.1  4.0 - 10.5 K/uL   RBC 4.98  4.22 - 5.81 MIL/uL   Hemoglobin 16.3  13.0 - 17.0 g/dL   HCT 62.1  30.8 - 65.7 %   MCV 92.8  78.0 - 100.0 fL   MCH 32.7  26.0 - 34.0 pg   MCHC 35.3  30.0 - 36.0 g/dL   RDW 84.6  96.2 - 95.2 %   Platelets 127 (*) 150 - 400 K/uL   Neutrophils Relative % 76  43 - 77 %   Neutro Abs 3.9  1.7 -  7.7 K/uL   Lymphocytes Relative 14  12 - 46 %   Lymphs Abs 0.7  0.7 - 4.0 K/uL   Monocytes Relative 9  3 - 12 %   Monocytes Absolute 0.5  0.1 - 1.0 K/uL   Eosinophils Relative 1  0 - 5 %   Eosinophils Absolute 0.1  0.0 - 0.7 K/uL   Basophils Relative 0  0 - 1 %   Basophils Absolute 0.0  0.0 - 0.1  K/uL  COMPREHENSIVE METABOLIC PANEL     Status: Abnormal   Collection Time    06/29/13 10:32 AM      Result Value Range   Sodium 140  135 - 145 mEq/L   Potassium 4.8  3.5 - 5.1 mEq/L   Chloride 105  96 - 112 mEq/L   CO2 26  19 - 32 mEq/L   Glucose, Bld 112 (*) 70 - 99 mg/dL   BUN 16  6 - 23 mg/dL   Creatinine, Ser 1.61  0.50 - 1.35 mg/dL   Calcium 9.2  8.4 - 09.6 mg/dL   Total Protein 6.8  6.0 - 8.3 g/dL   Albumin 3.7  3.5 - 5.2 g/dL   AST 15  0 - 37 U/L   ALT 10  0 - 53 U/L   Alkaline Phosphatase 58  39 - 117 U/L   Total Bilirubin 1.1  0.3 - 1.2 mg/dL   GFR calc non Af Amer 68 (*) >90 mL/min   GFR calc Af Amer 78 (*) >90 mL/min   Comment: (NOTE)     The eGFR has been calculated using the CKD EPI equation.     This calculation has not been validated in all clinical situations.     eGFR's persistently <90 mL/min signify possible Chronic Kidney     Disease.  POCT I-STAT TROPONIN I     Status: None   Collection Time    06/29/13 10:52 AM      Result Value Range   Troponin i, poc 0.01  0.00 - 0.08 ng/mL   Comment 3            Comment: Due to the release kinetics of cTnI,     a negative result within the first hours     of the onset of symptoms does not rule out     myocardial infarction with certainty.     If myocardial infarction is still suspected,     repeat the test at appropriate intervals.    Dg Chest 2 View  06/29/2013   *RADIOLOGY REPORT*  Clinical Data: Chest pain.  CHEST - 2 VIEW  Comparison: Chest x-ray 08/17/2012.  Findings: Lung volumes are slightly low.  No acute consolidative airspace disease.  No pleural effusions.  There is some mild bibasilar scarring.  Mild  diffuse peribronchial cuffing, similar to prior studies.  No evidence of pulmonary edema.  Heart size is normal.  Upper mediastinal contours are within normal limits. Atherosclerosis in the thoracic aorta.  IMPRESSION: 1.  Low lung volumes without definite radiographic evidence of acute cardiopulmonary disease. 2.  Mild diffuse peribronchial cuffing is similar to prior studies, favored to represent chronic bronchitis in this patient. 3.  Atherosclerosis.   Original Report Authenticated By: Trudie Reed, M.D.    Review of Systems  Constitutional: Positive for malaise/fatigue and diaphoresis.  Respiratory: Negative for shortness of breath.   Cardiovascular: Positive for palpitations. Negative for chest pain.  Neurological: Positive for dizziness. Negative for loss of consciousness.  Endo/Heme/Allergies: Does not bruise/bleed easily.  All other systems reviewed and are negative.   Blood pressure 132/77, pulse 114, temperature 97.7 F (36.5 C), temperature source Oral, resp. rate 17, height 5\' 11"  (1.803 m), weight 190 lb (86.183 kg), SpO2 98.00%. Physical Exam  Constitutional: He is oriented to person, place, and time. He appears well-developed and well-nourished. No distress.  Neck: No JVD present. Carotid bruit is not present.  Cardiovascular: Normal rate and intact distal pulses.  An  irregularly irregular rhythm present. Exam reveals friction rub. Exam reveals no gallop.   No murmur heard. Pulses:      Radial pulses are 2+ on the right side, and 2+ on the left side.       Dorsalis pedis pulses are 2+ on the right side, and 2+ on the left side.  Respiratory: Effort normal and breath sounds normal. No respiratory distress. He has no wheezes. He has no rales.  GI: Soft. Bowel sounds are normal. He exhibits no distension and no mass. There is no tenderness.  Musculoskeletal: He exhibits no edema.  Neurological: He is alert and oriented to person, place, and time.  Skin: Skin is warm and dry.  He is not diaphoretic.  Psychiatric: He has a normal mood and affect. His behavior is normal.    Assessment/Plan: Principal Problem:   Atrial fibrillation with RVR Active Problems:   Paroxysmal atrial fibrillation   Hyperlipidemia   Chronic anticoagulation -Xarelto  Plan: Pt with h/o PAF, presents to [facility] ER with complaint of palpitations and near syncope, found to be in atrial fib with a controlled ventricular response. Pt notes that he has had rates between 114-130 bpm in the past 24 hours. He is now hemodynamically stable w/ HR in the 80s-90s and SBP in the 130s. His symptoms have resolved. He already has a 2 week event monitor, that was ordered by Wilburt Finlay, at that time of his last office visit, to evaluate for recurrence PAF. Since he has had recurrence of AF on Rythmol, we may need to consider switching to another antiarrhythmic or consider OP cardioversion. I do not feel that the patient needs admission, as he is now stable. MD to assess and will help to determine final plan.    Allayne Butcher, PA-C 06/29/2013, 12:37 PM

## 2013-06-29 NOTE — ED Notes (Signed)
Per Cardiologist patient able to eat. Given [place] sandwich, apple sauce, and water.

## 2013-06-30 ENCOUNTER — Inpatient Hospital Stay (HOSPITAL_COMMUNITY): Payer: Medicare Other | Admitting: Anesthesiology

## 2013-06-30 ENCOUNTER — Encounter (HOSPITAL_COMMUNITY): Payer: Self-pay | Admitting: Cardiology

## 2013-06-30 ENCOUNTER — Encounter (HOSPITAL_COMMUNITY)
Admission: EM | Disposition: A | Discharge status: Home / Self Care | Payer: Self-pay | Source: Home / Self Care | Attending: Internal Medicine

## 2013-06-30 ENCOUNTER — Encounter (HOSPITAL_COMMUNITY): Payer: Self-pay | Admitting: Anesthesiology

## 2013-06-30 DIAGNOSIS — I4892 Unspecified atrial flutter: Secondary | ICD-10-CM

## 2013-06-30 DIAGNOSIS — IMO0001 Reserved for inherently not codable concepts without codable children: Secondary | ICD-10-CM

## 2013-06-30 HISTORY — PX: CARDIOVERSION: SHX1299

## 2013-06-30 LAB — BASIC METABOLIC PANEL
BUN: 17 mg/dL (ref 6–23)
Chloride: 103 mEq/L (ref 96–112)
GFR calc Af Amer: 79 mL/min — ABNORMAL LOW (ref >90)
GFR calc non Af Amer: 68 mL/min — ABNORMAL LOW (ref >90)
Potassium: 3.8 mEq/L (ref 3.5–5.1)
Sodium: 139 mEq/L (ref 135–145)

## 2013-06-30 LAB — CBC
MCH: 33 pg (ref 26.0–34.0)
MCHC: 35.8 g/dL (ref 30.0–36.0)
MCV: 92.3 fL (ref 78.0–100.0)
Platelets: 129 10*3/uL — ABNORMAL LOW (ref 150–400)
RDW: 13 % (ref 11.5–15.5)

## 2013-06-30 LAB — TSH: TSH: 2.046 u[IU]/mL (ref 0.350–4.500)

## 2013-06-30 SURGERY — CARDIOVERSION
Anesthesia: Monitor Anesthesia Care | Wound class: Clean

## 2013-06-30 SURGERY — CARDIOVERSION
Anesthesia: Monitor Anesthesia Care

## 2013-06-30 MED ORDER — AMIODARONE HCL 200 MG PO TABS
200.0000 mg | ORAL_TABLET | Freq: Two times a day (BID) | ORAL | Status: DC
  Administered 2013-06-30: 200 mg via ORAL
  Filled 2013-06-30 (×2): qty 1

## 2013-06-30 MED ORDER — ACETAMINOPHEN 325 MG PO TABS: 650.0000 mg | ORAL_TABLET | ORAL | Status: DC | PRN

## 2013-06-30 MED ORDER — SODIUM CHLORIDE 0.9 % IV SOLN
INTRAVENOUS | Status: DC | PRN
  Administered 2013-06-30: 10:00:00 via INTRAVENOUS

## 2013-06-30 MED ORDER — AMIODARONE HCL 200 MG PO TABS: 200.0000 mg | ORAL_TABLET | Freq: Two times a day (BID) | ORAL | Status: DC

## 2013-06-30 MED ORDER — LISINOPRIL 2.5 MG PO TABS
2.5000 mg | ORAL_TABLET | Freq: Every day | ORAL | Status: DC
  Administered 2013-06-30: 2.5 mg via ORAL
  Filled 2013-06-30: qty 1

## 2013-06-30 MED ORDER — PROPOFOL 10 MG/ML IV BOLUS
INTRAVENOUS | Status: DC | PRN
  Administered 2013-06-30: 90 mg via INTRAVENOUS

## 2013-06-30 NOTE — Progress Notes (Signed)
Subjective:  No complaints except for being NPO  Objective:  Vital Signs in the last 24 hours: Temp:  [97.7 F (36.5 C)-98.6 F (37 C)] 98.6 F (37 C) (08/29 0336) Pulse Rate:  [61-131] 69 (08/28 2012) Resp:  [11-24] 17 (08/29 0600) BP: (91-136)/(59-115) 111/71 mmHg (08/29 0600) SpO2:  [92 %-99 %] 92 % (08/29 0600) Weight:  [189 lb 2.5 oz (85.8 kg)-190 lb (86.183 kg)] 189 lb 2.5 oz (85.8 kg) (08/28 1744)  Intake/Output from previous day:  Intake/Output Summary (Last 24 hours) at 06/30/13 0754 Last data filed at 06/30/13 0600  Gross per 24 hour  Intake 293.92 ml  Output    600 ml  Net -306.08 ml    Physical Exam: General appearance: alert, cooperative, appears stated age and no distress Neck: no JVD and supple, symmetrical, trachea midline Lungs: clear to auscultation bilaterally Heart: irregularly irregular rhythm   Rate: 80  Rhythm: atrial flutter  Lab Results:  Recent Labs  06/29/13 1032 06/30/13 0422  WBC 5.1 5.9  HGB 16.3 15.4  PLT 127* 129*    Recent Labs  06/29/13 1032 06/30/13 0422  NA 140 139  K 4.8 3.8  CL 105 103  CO2 26 26  GLUCOSE 112* 99  BUN 16 17  CREATININE 1.04 1.03   No results found for this basename: TROPONINI, CK, MB,  in the last 72 hours No results found for this basename: INR,  in the last 72 hours  Imaging: Imaging results have been reviewed  Cardiac Studies:  Assessment/Plan:   Principal Problem:   Atrial fibrillation with RVR Active Problems:   Paroxysmal atrial fibrillation   Hypotension- transient, related to tachycardia   Hyperlipidemia   Chronic anticoagulation -Xarelto   Normal coronary arteries 2002    PLAN: After discussion with Dr Johney Frame the pt prefers to start Amiodarone and have a bedside DCCV today. He declined RFA and did not want to stay for Rythmol washout followed by a Tykosin load.  Corine Shelter PA-C Beeper [phone] 06/30/2013, 7:54 AM  I have seen and examined the patient along with Corine Shelter, PA.  I have reviewed the chart, notes and new data.  I agree with PA's note.  Key new complaints: no problems while at rest Key examination changes: remains in A Flutter with 4:1 AV block and VR 75 bpm; no signs CHF  PLAN: For bedside DC cardioversion at 10 AM today. Also discussed possible future management options, including revisiting RF ablation if arrhythmia recurs and possible need for pacemaker if he develops symptomatic bradycardia on antiarrhythmics. The procedure has been fully reviewed with the patient and written informed consent has been obtained.   Thurmon Fair, MD, [facility] [phone] 06/30/2013, 8:44 AM

## 2013-06-30 NOTE — Anesthesia Postprocedure Evaluation (Signed)
  Anesthesia Post-op Note  Patient: Stanley Waters  Procedure(s) Performed: Procedure(s): CARDIOVERSION (N/A)  Patient Location: Nursing Unit  Anesthesia Type:MAC  Level of Consciousness: awake, alert  and oriented  Airway and Oxygen Therapy: Patient Spontanous Breathing and Patient connected to nasal cannula oxygen  Post-op Pain: none  Post-op Assessment: Post-op Vital signs reviewed and Patient's Cardiovascular Status Stable  Post-op Vital Signs: Reviewed and stable  Complications: No apparent anesthesia complications

## 2013-06-30 NOTE — Transfer of Care (Signed)
Immediate Anesthesia Transfer of Care Note  Patient: Stanley Waters  Procedure(s) Performed: Procedure(s): CARDIOVERSION (N/A)  Patient Location: Nursing Unit  Anesthesia Type:MAC  Level of Consciousness: awake, alert  and oriented  Airway & Oxygen Therapy: Patient Spontanous Breathing and Patient connected to nasal cannula oxygen  Post-op Assessment: Report given to PACU RN and Post -op Vital signs reviewed and stable  Post vital signs: Reviewed and stable  Complications: No apparent anesthesia complications

## 2013-06-30 NOTE — Progress Notes (Signed)
Patient education completed, discharged to home with son.

## 2013-06-30 NOTE — Progress Notes (Signed)
Notified Md about pt's request for sleep medication. New orders received.  Will continue to monitor. Emilie [place] Liter

## 2013-06-30 NOTE — Preoperative (Signed)
Beta Blockers   Reason not to administer Beta Blockers:Not Applicable 

## 2013-06-30 NOTE — Care Management Note (Signed)
    Page 1 of 1   06/30/2013     8:39:18 AM   CARE MANAGEMENT NOTE 06/30/2013  Patient:  Stanley Waters, Stanley Waters   Account Number:  [id number]  Date Initiated:  06/30/2013  Documentation initiated by:  Junius Creamer  Subjective/Objective Assessment:   adm w at fib     Action/Plan:   lives alone, pcp dr Garwin Brothers   Anticipated DC Date:     Anticipated DC Plan:        DC Planning Services  CM consult      Choice offered to / List presented to:             Status of service:   Medicare Important Message given?   (If response is "NO", the following Medicare IM given date fields will be blank) Date Medicare IM given:   Date Additional Medicare IM given:    Discharge Disposition:    Per UR Regulation:  Reviewed for med. necessity/level of care/duration of stay  If discussed at Long Length of Stay Meetings, dates discussed:    Comments:

## 2013-06-30 NOTE — Discharge Summary (Signed)
Patient ID: Stanley Waters,  MRN: [id number], DOB/AGE: 07/12/1935 78 y.o.  Admit date: 06/29/2013 Discharge date: 06/30/2013  Primary Care Provider:  Primary Cardiologist: Dr Allyson Sabal  Discharge Diagnoses Principal Problem:   Atrial fibrillation with RVR Active Problems:   Paroxysmal atrial fibrillation   Hypotension- transient, related to tachycardia   Hyperlipidemia   Chronic anticoagulation -Xarelto   Normal coronary arteries 2002    Procedures: DCCV 06/30/13   Hospital Course:  78 y/o followed by Dr Allyson Sabal with a history of PAF. He was cardioverted in Sept 2013. He is on chronic anticoagulation. He has a past history of normal coronaries in 2002 and a low risk Myoview Sept 2012. He was on Rythmol 300 mg TID but this was recently cut back because of bradycardia. He was admitted through the ER 06/29/13 with rapid AF. Diltiazem IV was added and his rate controlled. Upon review of his telemetry it became clear was in an atypical flutter type rhythm. He was seen in consult by Dr Johney Frame and treatment options were discussed. The pt declined a RFA and did not want to stay in the hospital for Rythmol washout followed by Tykosin loading. It was decided to proceed with DCCV and adding Amiodarone. This was done the morning of the 29th without complications. He will be discharged later on the 29th. He has a follow up appointment with Dr Allyson Sabal already scheduled for 07/06/13 and he will keep that. As his Amiodarone is loaded we may need to cut back, or stop, his Metoprolol.   Discharge Vitals:  Blood pressure 141/91, pulse 95, temperature 98.5 F (36.9 C), temperature source Oral, resp. rate 18, height 5' 11.5" (1.816 m), weight 189 lb 2.5 oz (85.8 kg), SpO2 96.00%.    Labs: Results for orders placed during the hospital encounter of 06/29/13 (from the past 48 hour(s))  CBC WITH DIFFERENTIAL     Status: Abnormal   Collection Time    06/29/13 10:32 AM      Result Value Range   WBC 5.1  4.0 - 10.5 K/uL    RBC 4.98  4.22 - 5.81 MIL/uL   Hemoglobin 16.3  13.0 - 17.0 g/dL   HCT 65.7  84.6 - 96.2 %   MCV 92.8  78.0 - 100.0 fL   MCH 32.7  26.0 - 34.0 pg   MCHC 35.3  30.0 - 36.0 g/dL   RDW 95.2  84.1 - 32.4 %   Platelets 127 (*) 150 - 400 K/uL   Neutrophils Relative % 76  43 - 77 %   Neutro Abs 3.9  1.7 - 7.7 K/uL   Lymphocytes Relative 14  12 - 46 %   Lymphs Abs 0.7  0.7 - 4.0 K/uL   Monocytes Relative 9  3 - 12 %   Monocytes Absolute 0.5  0.1 - 1.0 K/uL   Eosinophils Relative 1  0 - 5 %   Eosinophils Absolute 0.1  0.0 - 0.7 K/uL   Basophils Relative 0  0 - 1 %   Basophils Absolute 0.0  0.0 - 0.1 K/uL  COMPREHENSIVE METABOLIC PANEL     Status: Abnormal   Collection Time    06/29/13 10:32 AM      Result Value Range   Sodium 140  135 - 145 mEq/L   Potassium 4.8  3.5 - 5.1 mEq/L   Chloride 105  96 - 112 mEq/L   CO2 26  19 - 32 mEq/L   Glucose, Bld 112 (*) 70 - 99 mg/dL  BUN 16  6 - 23 mg/dL   Creatinine, Ser 7.84  0.50 - 1.35 mg/dL   Calcium 9.2  8.4 - 69.6 mg/dL   Total Protein 6.8  6.0 - 8.3 g/dL   Albumin 3.7  3.5 - 5.2 g/dL   AST 15  0 - 37 U/L   ALT 10  0 - 53 U/L   Alkaline Phosphatase 58  39 - 117 U/L   Total Bilirubin 1.1  0.3 - 1.2 mg/dL   GFR calc non Af Amer 68 (*) >90 mL/min   GFR calc Af Amer 78 (*) >90 mL/min   Comment: (NOTE)     The eGFR has been calculated using the CKD EPI equation.     This calculation has not been validated in all clinical situations.     eGFR's persistently <90 mL/min signify possible Chronic Kidney     Disease.  POCT I-STAT TROPONIN I     Status: None   Collection Time    06/29/13 10:52 AM      Result Value Range   Troponin i, poc 0.01  0.00 - 0.08 ng/mL   Comment 3            Comment: Due to the release kinetics of cTnI,     a negative result within the first hours     of the onset of symptoms does not rule out     myocardial infarction with certainty.     If myocardial infarction is still suspected,     repeat the test at  appropriate intervals.  MRSA PCR SCREENING     Status: None   Collection Time    06/29/13  5:58 PM      Result Value Range   MRSA by PCR NEGATIVE  NEGATIVE   Comment:            The GeneXpert MRSA Assay (FDA     approved for NASAL specimens     only), is one component of a     comprehensive MRSA colonization     surveillance program. It is not     intended to diagnose MRSA     infection nor to guide or     monitor treatment for     MRSA infections.  CBC     Status: Abnormal   Collection Time    06/30/13  4:22 AM      Result Value Range   WBC 5.9  4.0 - 10.5 K/uL   RBC 4.66  4.22 - 5.81 MIL/uL   Hemoglobin 15.4  13.0 - 17.0 g/dL   HCT 29.5  28.4 - 13.2 %   MCV 92.3  78.0 - 100.0 fL   MCH 33.0  26.0 - 34.0 pg   MCHC 35.8  30.0 - 36.0 g/dL   RDW 44.0  10.2 - 72.5 %   Platelets 129 (*) 150 - 400 K/uL  BASIC METABOLIC PANEL     Status: Abnormal   Collection Time    06/30/13  4:22 AM      Result Value Range   Sodium 139  135 - 145 mEq/L   Potassium 3.8  3.5 - 5.1 mEq/L   Chloride 103  96 - 112 mEq/L   CO2 26  19 - 32 mEq/L   Glucose, Bld 99  70 - 99 mg/dL   BUN 17  6 - 23 mg/dL   Creatinine, Ser 3.66  0.50 - 1.35 mg/dL   Calcium 8.6  8.4 - 44.0 mg/dL  GFR calc non Af Amer 68 (*) >90 mL/min   GFR calc Af Amer 79 (*) >90 mL/min   Comment: (NOTE)     The eGFR has been calculated using the CKD EPI equation.     This calculation has not been validated in all clinical situations.     eGFR's persistently <90 mL/min signify possible Chronic Kidney     Disease.    Disposition:  Follow-up Information   Follow up with Runell Gess, MD On 07/06/2013. (9:30)    Specialty:  Cardiology   Contact information:   [address] [place] [address] [phone]       Discharge Medications:    Medication List    TAKE these medications       acetaminophen 325 MG tablet  Commonly known as:  TYLENOL  Take 2 tablets (650 mg total) by mouth every 4 (four)  hours as needed.     amiodarone 200 MG tablet  Commonly known as:  PACERONE  Take 1 tablet (200 mg total) by mouth 2 (two) times daily. Or as directed     cetirizine 10 MG tablet  Commonly known as:  ZYRTEC  Take 10 mg by mouth as needed for allergies.     doxazosin 8 MG tablet  Commonly known as:  CARDURA  Take 8 mg by mouth at bedtime.     furosemide 20 MG tablet  Commonly known as:  LASIX  Take 20 mg by mouth as needed for fluid.     ICAPS Caps  Take 1 capsule by mouth daily.     lisinopril 2.5 MG tablet  Commonly known as:  PRINIVIL,ZESTRIL  Take 2.5 mg by mouth daily as needed (blood pressure).     metoprolol tartrate 25 MG tablet  Commonly known as:  LOPRESSOR  Take 12.5 mg by mouth daily.     OMEGA-3 KRILL OIL PO  Take 1 capsule by mouth daily.      ASK your doctor about these medications       propafenone 225 MG tablet  Commonly known as:  RYTHMOL  Take 225 mg by mouth 3 (three) times daily.     psyllium 58.6 % powder  Commonly known as:  METAMUCIL  Take 1 packet by mouth as needed (constipation).     rivaroxaban 10 MG Tabs tablet  Commonly known as:  XARELTO  Take 20 mg by mouth daily.     simvastatin 5 MG tablet  Commonly known as:  ZOCOR  Take 5 mg by mouth at bedtime.     SYSTANE OP  Place 1 drop into both eyes daily.     Vitamin D (Ergocalciferol) 50000 UNITS Caps capsule  Commonly known as:  [name]  Take 50,000 Units by mouth every 7 (seven) days.         Duration of Discharge Encounter: Greater than 30 minutes including physician time.  Jolene Provost PA-C 06/30/2013 11:27 AM

## 2013-06-30 NOTE — Anesthesia Preprocedure Evaluation (Addendum)
Anesthesia Evaluation  Patient identified by MRN, date of birth, ID band Patient awake    Reviewed: Allergy & Precautions, H&P , NPO status , Patient's Chart, lab work & pertinent test results, reviewed documented beta blocker date and time   History of Anesthesia Complications Negative for: history of anesthetic complications  Airway Mallampati: II TM Distance: >3 FB Neck ROM: Full    Dental  (+) Dental Advisory Given and Teeth Intact   Pulmonary former smoker (quit 1965),  breath sounds clear to auscultation  Pulmonary exam normal       Cardiovascular hypertension, Pt. on medications and Pt. on home beta blockers + dysrhythmias (xarelto) Atrial Fibrillation Rhythm:Irregular Rate:Normal  TEE 07/07/12 - EF =50-55%, valves OK '12 myoview: no ischemia, EF 61% '02 cath: normal coronaries   Neuro/Psych    GI/Hepatic negative GI ROS, Neg liver ROS,   Endo/Other  negative endocrine ROS  Renal/GU negative Renal ROS     Musculoskeletal   Abdominal (+) + obese,   Peds  Hematology H/o thrombocytopenia   Anesthesia Other Findings   Reproductive/Obstetrics                         Anesthesia Physical Anesthesia Plan  ASA: III  Anesthesia Plan: General   Post-op Pain Management:    Induction: Intravenous  Airway Management Planned: Mask and Natural Airway  Additional Equipment:   Intra-op Plan:   Post-operative Plan:   Informed Consent: I have reviewed the patients History and Physical, chart, labs and discussed the procedure including the risks, benefits and alternatives for the proposed anesthesia with the patient or authorized representative who has indicated his/her understanding and acceptance.   Dental advisory given  Plan Discussed with: CRNA and Surgeon  Anesthesia Plan Comments: (Plan routine monitors, GA for cardioversion)       Anesthesia Quick Evaluation

## 2013-06-30 NOTE — Op Note (Signed)
Procedure: Electrical Cardioversion Indications:  Atrial Flutter  Procedure Details:  Consent: Risks of procedure as well as the alternatives and risks of each were explained to the (patient/caregiver).  Consent for procedure obtained.  Time Out: Verified patient identification, verified procedure, site/side was marked, verified correct patient position, special equipment/implants available, medications/allergies/relevent history reviewed, required imaging and test results available.  Performed  Patient placed on cardiac monitor, pulse oximetry, supplemental oxygen as necessary.  Sedation given: IV Propofol, Dr. Jean Rosenthal Pacer pads placed anterior and posterior chest.  Cardioverted 1 time(s).  Cardioverted at 120J.synchroniszed biphasic  Evaluation: Findings: Post procedure EKG shows: NSR Complications: None Patient did tolerate procedure well.  Time Spent Directly with the Patient:  40 minutes   Stanley Waters 06/30/2013, 10:44 AM

## 2013-07-04 ENCOUNTER — Encounter (HOSPITAL_COMMUNITY): Payer: Self-pay | Admitting: Cardiovascular Disease

## 2013-07-06 ENCOUNTER — Encounter: Payer: Self-pay | Admitting: Cardiovascular Disease

## 2013-07-06 ENCOUNTER — Ambulatory Visit (INDEPENDENT_AMBULATORY_CARE_PROVIDER_SITE_OTHER): Payer: Medicare Other | Admitting: Cardiovascular Disease

## 2013-07-06 VITALS — BP 130/70 | HR 49 | Ht 71.0 in | Wt 196.0 lb

## 2013-07-06 DIAGNOSIS — I4891 Unspecified atrial fibrillation: Secondary | ICD-10-CM

## 2013-07-06 NOTE — Assessment & Plan Note (Signed)
Patient has a history of PAF in the past on Rythmol. He was initially admitted by Dr. Loleta Chance today for her A. Fib with RVR on 06/29/13. He was seen in consultation by Dr. Hillis Range suggested A. Fib ablation however the patient preferred to have bedside cardioversion which was performed by Dr.Croitoru. He was placed on amiodarone and Xarelto. Since discharge she had several episodes of breakthrough PAF route which responded to supplemental metoprolol. His amiodarone may not have had time to reach a steady state. I am going to have him see Huey Bienenstock PAC back in 4-6 weeks at me back in 3 months. Should he continue to have breakthrough episodes we will reconsider A. Fib ablation with Dr. Johney Frame.

## 2013-07-06 NOTE — Progress Notes (Signed)
07/06/2013 Stanley Waters   Apr 29, 1935  [id number]  Primary Physician Dorcas Carrow, MD Primary Cardiologist: Runell Gess MD Roseanne Reno   HPI:  The patient is a very pleasant 78 year old mildly overweight widowed Caucasian male father of 1, grandfather of 2 grandchildren who I last saw 4 months ago. He has a history of normal coronary arteries by cath in 2002. He has PAF and has undergone a transesophageal echo, most recently by Dr. [place] [name] July 07, 2012. His other problems include hypertension and hyperlipidemia. I adjusted his medications because he was bradycardic when I last saw him and changed his Eliquis to Xarelto at his request because of cost. He apparently has developed a rash since that time which he attributes to the Xarelto. He was admitted to the hospital on 06/29/13 with A. Fib with RVR. Ultimately underwent bedside DC cardioversion by Dr. Royann Shivers  successfully to sinus rhythm after being switched from Rythmol to amiodarone. Since discharge he had multiple episodes of right true PAF responsive to when necessary supplemental metoprolol. He wore an event monitor for 2 weeks but did show episodes of bradycardia.     Current Outpatient Prescriptions  Medication Sig Dispense Refill  . acetaminophen (TYLENOL) 325 MG tablet Take 2 tablets (650 mg total) by mouth every 4 (four) hours as needed.      [name] amiodarone (PACERONE) 200 MG tablet Take 1 tablet (200 mg total) by mouth 2 (two) times daily. Or as directed  60 tablet  5  . cetirizine (ZYRTEC) 10 MG tablet Take 10 mg by mouth as needed for allergies.      [name] doxazosin (CARDURA) 8 MG tablet Take 8 mg by mouth at bedtime.      . furosemide (LASIX) 20 MG tablet Take 20 mg by mouth as needed for fluid.      [name] lisinopril (PRINIVIL,ZESTRIL) 2.5 MG tablet Take 2.5 mg by mouth daily as needed (blood pressure).       . metoprolol tartrate (LOPRESSOR) 25 MG tablet Take 12.5 mg by mouth daily.       . Multiple  Vitamins-Minerals (ICAPS) CAPS Take 1 capsule by mouth daily.      . OMEGA-3 KRILL OIL PO Take 1 capsule by mouth daily.      Bertram Gala [name] (SYSTANE OP) Place 1 drop into both eyes daily.       . psyllium (METAMUCIL) 58.6 % powder Take 1 packet by mouth as needed (constipation).       . rivaroxaban (XARELTO) 10 MG TABS tablet Take 20 mg by mouth daily.      . simvastatin (ZOCOR) 5 MG tablet Take 5 mg by mouth at bedtime.      . Vitamin D, Ergocalciferol, ([name]) 50000 UNITS CAPS Take 50,000 Units by mouth every 7 (seven) days.       No current facility-administered medications for this visit.    Allergies  Allergen Reactions  . Prednisone Swelling, Other (See Comments) and Hypertension    Tachycardia  . Doxycycline Rash    History   Social History  . Marital Status: Widowed    Spouse Name: N/A    Number of Children: 2  . Years of Education: N/A   Occupational History  .      Road Holiday representative   Social History Main Topics  . Smoking status: Former Smoker    Types: Cigarettes    Quit date: 11/03/1963  . Smokeless tobacco: Never Used  . Alcohol Use: No  .  Drug Use: No  . Sexual Activity: Not on file   Other Topics Concern  . Not on file   Social History Narrative  . No narrative on file     Review of Systems: General: negative for chills, fever, night sweats or weight changes.  Cardiovascular: negative for chest pain, dyspnea on exertion, edema, orthopnea, palpitations, paroxysmal nocturnal dyspnea or shortness of breath Dermatological: negative for rash Respiratory: negative for cough or wheezing Urologic: negative for hematuria Abdominal: negative for nausea, vomiting, diarrhea, bright red blood per rectum, melena, or hematemesis Neurologic: negative for visual changes, syncope, or dizziness All other systems reviewed and are otherwise negative except as noted above.    Blood pressure 130/70, pulse 49, height 5\' 11"  (1.803 m), weight 196  lb (88.905 kg).  General appearance: alert and no distress Neck: no adenopathy, no carotid bruit, no JVD, supple, symmetrical, trachea midline and thyroid not enlarged, symmetric, no tenderness/mass/nodules Lungs: clear to auscultation bilaterally Heart: regular rate and rhythm, S1, S2 normal, no murmur, click, rub or gallop Extremities: extremities normal, atraumatic, no cyanosis or edema  EKG sinus bradycardia at 49 without ST or T wave changes  ASSESSMENT AND PLAN:   Paroxysmal atrial fibrillation Patient has a history of PAF in the past on Rythmol. He was initially admitted by Dr. Loleta Chance today for her A. Fib with RVR on 06/29/13. He was seen in consultation by Dr. Hillis Range suggested A. Fib ablation however the patient preferred to have bedside cardioversion which was performed by Dr.Croitoru. He was placed on amiodarone and Xarelto. Since discharge she had several episodes of breakthrough PAF route which responded to supplemental metoprolol. His amiodarone may not have had time to reach a steady state. I am going to have him see Huey Bienenstock PAC back in 4-6 weeks at me back in 3 months. Should he continue to have breakthrough episodes we will reconsider A. Fib ablation with Dr. Johney Frame.      Runell Gess MD FACP,FACC,FAHA, [facility] 07/06/2013 11:05 AM

## 2013-07-06 NOTE — Patient Instructions (Addendum)
Your physician wants you to follow-up in: 6 weeks with Wilburt Finlay [facility] and 3 months with Dr Allyson Sabal.  You will receive a reminder letter in the mail two months in advance. If you don't receive a letter, please call our office to schedule the follow-up appointment.

## 2013-10-04 ENCOUNTER — Other Ambulatory Visit: Payer: Self-pay | Admitting: *Deleted

## 2013-10-04 MED ORDER — DOXAZOSIN MESYLATE 8 MG PO TABS: 8.0000 mg | ORAL_TABLET | Freq: Every day | ORAL | Status: AC

## 2013-11-16 ENCOUNTER — Telehealth: Payer: Self-pay | Admitting: *Deleted

## 2013-11-16 DIAGNOSIS — E782 Mixed hyperlipidemia: Secondary | ICD-10-CM

## 2013-11-16 DIAGNOSIS — Z79899 Other long term (current) drug therapy: Secondary | ICD-10-CM

## 2013-11-16 NOTE — Telephone Encounter (Signed)
Pt stated that Dr. Loletha Grayer stated that he needed to have blood work done to check his liver bc of his medication. He was not really sure of it. He sees Dr. [name] in March

## 2013-11-17 NOTE — Telephone Encounter (Signed)
RN reviewed chart.  Pt last seen by Dr. [name] 9.4.14 and was supposed to f/u with Tarri Fuller 4-6 weeks and with Dr. [name] in 6 months.  Unable to locate any documentation of labs being ordered by Dr. [name] while pt was admitted.  Pt is scheduled to see Dr. [name] on 3.3.15 and Dr. [name] will be notified to review for lab orders.  Message forwarded to K. Donne Hazel, RN to discuss w/ Dr. [name].

## 2013-11-20 NOTE — Telephone Encounter (Signed)
I dont see a specific order for labs, but patient hasn't had labs done in a while to check liver or cholesterol.  I will mail a lab slip to pt.  Patient is agreeable.

## 2013-11-28 ENCOUNTER — Telehealth: Payer: Self-pay | Admitting: Cardiovascular Disease

## 2013-11-28 NOTE — Telephone Encounter (Signed)
Is on Xarelto and needs to have a tooth pulled and wants to know what day he needs to stop with the Xarelto to have this done .[name] Please Call    Thanks

## 2013-11-28 NOTE — Telephone Encounter (Signed)
Message forwarded to K. Vogel, RN to discuss w/ Dr. Berry.   

## 2013-11-30 NOTE — Telephone Encounter (Signed)
I reviewed the chart with Cecilie Kicks NP yesterday afternoon.  She advised that patient needed an office visit with an extender or JB prior to making a decision about holding xarelto.    I called patient today and he wants to wait and discuss this with Dr [name] at his March appt.  If he has problems and needs the tooth out before that he will call back.

## 2013-12-26 ENCOUNTER — Other Ambulatory Visit: Payer: Self-pay

## 2013-12-27 ENCOUNTER — Telehealth: Payer: Self-pay | Admitting: Cardiovascular Disease

## 2013-12-27 MED ORDER — AMIODARONE HCL 200 MG PO TABS: 200.0000 mg | ORAL_TABLET | Freq: Two times a day (BID) | ORAL | Status: DC

## 2013-12-27 NOTE — Telephone Encounter (Signed)
Per [facility] they have sent a request over for Amiodarone 200mg  and have not receive anything back as of yet .[name] Stating that they faxed it over since last Friday, yesterday and today .[name] Please call if you have any questions . Per the patient he is completely out of his medication .[name]

## 2013-12-27 NOTE — Telephone Encounter (Signed)
Rx was sent to pharmacy electronically. Patient notified of refill

## 2014-01-02 ENCOUNTER — Encounter: Payer: Self-pay | Admitting: Cardiovascular Disease

## 2014-01-02 ENCOUNTER — Ambulatory Visit (INDEPENDENT_AMBULATORY_CARE_PROVIDER_SITE_OTHER): Payer: Medicare Other | Admitting: Cardiovascular Disease

## 2014-01-02 VITALS — BP 132/70 | HR 57 | Ht 71.5 in | Wt 196.5 lb

## 2014-01-02 DIAGNOSIS — E785 Hyperlipidemia, unspecified: Secondary | ICD-10-CM

## 2014-01-02 DIAGNOSIS — I4891 Unspecified atrial fibrillation: Secondary | ICD-10-CM

## 2014-01-02 DIAGNOSIS — I480 Paroxysmal atrial fibrillation: Secondary | ICD-10-CM

## 2014-01-02 NOTE — Assessment & Plan Note (Signed)
The patient remains in normal sinus rhythm. He is on amiodarone and Xarelto.

## 2014-01-02 NOTE — Progress Notes (Signed)
01/02/2014 SON [name]   January 17, 1935  [id number]  Primary Physician [name], MD Primary Cardiologist: Lorretta Harp MD Stanley Waters   HPI:  The patient is a very pleasant 79 year old mildly overweight widowed Caucasian male father of 23, grandfather of 2 grandchildren who I last saw 4 months ago. He has a history of normal coronary arteries by cath in 2002. He has PAF and has undergone a transesophageal echo, most recently by Dr. [name] July 07, 2012. His other problems include hypertension and hyperlipidemia. I adjusted his medications because he was bradycardic when I last saw him and changed his Eliquis to Xarelto at his request because of cost. He apparently has developed a rash since that time which he attributes to the Xarelto. He was admitted to the hospital on 06/29/13 with A. Fib with RVR. Ultimately underwent bedside DC cardioversion by Dr. [name] successfully to sinus rhythm after being switched from Rythmol to amiodarone. Since discharge he had multiple episodes of right true PAF responsive to when necessary supplemental metoprolol. He wore an event monitor for 2 weeks but did show episodes of bradycardia.since I saw him last in September he has had no episodes of atrial fibrillation. He remains on amiodarone and verapamil. He did stop his lisinopril and metoprolol because of hypotension. He denies chest pain, shortness of breath or presyncope.     Current Outpatient Prescriptions  Medication Sig Dispense Refill  . amiodarone (PACERONE) 200 MG tablet Take 1 tablet (200 mg total) by mouth 2 (two) times daily. Or as directed  180 tablet  2  . cetirizine (ZYRTEC) 10 MG tablet Take 10 mg by mouth as needed for allergies.      [name] doxazosin (CARDURA) 8 MG tablet Take 1 tablet (8 mg total) by mouth at bedtime.  90 tablet  3  . furosemide (LASIX) 20 MG tablet Take 20 mg by mouth as needed for fluid.      [name] lisinopril (PRINIVIL,ZESTRIL) 2.5 MG tablet Take 2.5  mg by mouth daily as needed (blood pressure).       . Multiple Vitamins-Minerals (ICAPS) CAPS Take 1 capsule by mouth daily.      . OMEGA-3 KRILL OIL PO Take 1 capsule by mouth daily.      Stanley Waters (SYSTANE OP) Place 1 drop into both eyes daily.       . psyllium (METAMUCIL) 58.6 % powder Take 1 packet by mouth as needed (constipation).       . rivaroxaban (XARELTO) 10 MG TABS tablet Take 20 mg by mouth daily.      . simvastatin (ZOCOR) 5 MG tablet Take 5 mg by mouth at bedtime.      . Vitamin D, Ergocalciferol, ([name]) 50000 UNITS CAPS Take 50,000 Units by mouth every 7 (seven) days.       No current facility-administered medications for this visit.    Allergies  Allergen Reactions  . Prednisone Swelling, Other (See Comments) and Hypertension    Tachycardia  . Doxycycline Rash    History   Social History  . Marital Status: Widowed    Spouse Name: N/A    Number of Children: 2  . Years of Education: N/A   Occupational History  .      Road Architect   Social History Main Topics  . Smoking status: Former Smoker    Types: Cigarettes    Quit date: 11/03/1963  . Smokeless tobacco: Never Used  . Alcohol Use: No  .  Drug Use: No  . Sexual Activity: Not on file   Other Topics Concern  . Not on file   Social History Narrative  . No narrative on file     Review of Systems: General: negative for chills, fever, night sweats or weight changes.  Cardiovascular: negative for chest pain, dyspnea on exertion, edema, orthopnea, palpitations, paroxysmal nocturnal dyspnea or shortness of breath Dermatological: negative for rash Respiratory: negative for cough or wheezing Urologic: negative for hematuria Abdominal: negative for nausea, vomiting, diarrhea, bright red blood per rectum, melena, or hematemesis Neurologic: negative for visual changes, syncope, or dizziness All other systems reviewed and are otherwise negative except as noted above.    Blood  pressure 132/70, pulse 57, height 5' 11.5" (1.816 m), weight 89.132 kg (196 lb 8 oz).  General appearance: alert and no distress Neck: no adenopathy, no carotid bruit, no JVD, supple, symmetrical, trachea midline and thyroid not enlarged, symmetric, no tenderness/mass/nodules Lungs: clear to auscultation bilaterally Heart: regular rate and rhythm, S1, S2 normal, no murmur, click, rub or gallop Extremities: extremities normal, atraumatic, no cyanosis or edema  EKG sinus bradycardia at 57 without ST or T wave changes  ASSESSMENT AND PLAN:   Paroxysmal atrial fibrillation The patient remains in normal sinus rhythm. He is on amiodarone and Xarelto.   Hyperlipidemia On statin therapy followed by his PCP      Lorretta Harp MD [facility], [facility] 01/02/2014 9:11 AM

## 2014-01-02 NOTE — Patient Instructions (Signed)
Your physician wants you to follow-up in: 6 months with an extender and 1 year with Dr Berry. You will receive a reminder letter in the mail two months in advance. If you don't receive a letter, please call our office to schedule the follow-up appointment.  

## 2014-01-02 NOTE — Assessment & Plan Note (Signed)
On statin therapy followed by his PCP 

## 2014-01-10 ENCOUNTER — Other Ambulatory Visit: Payer: Self-pay | Admitting: Dermatology

## 2014-02-02 ENCOUNTER — Emergency Department (HOSPITAL_BASED_OUTPATIENT_CLINIC_OR_DEPARTMENT_OTHER): Payer: Medicare Other

## 2014-02-02 ENCOUNTER — Encounter (HOSPITAL_BASED_OUTPATIENT_CLINIC_OR_DEPARTMENT_OTHER): Payer: Self-pay | Admitting: Emergency Medicine

## 2014-02-02 ENCOUNTER — Emergency Department (HOSPITAL_BASED_OUTPATIENT_CLINIC_OR_DEPARTMENT_OTHER)
Admission: EM | Admit: 2014-02-02 | Discharge: 2014-02-02 | Disposition: A | Discharge status: Home / Self Care | Payer: Medicare Other | Attending: Emergency Medicine | Admitting: Emergency Medicine

## 2014-02-02 DIAGNOSIS — Z8701 Personal history of pneumonia (recurrent): Secondary | ICD-10-CM | POA: Insufficient documentation

## 2014-02-02 DIAGNOSIS — S99929A Unspecified injury of unspecified foot, initial encounter: Principal | ICD-10-CM

## 2014-02-02 DIAGNOSIS — G609 Hereditary and idiopathic neuropathy, unspecified: Secondary | ICD-10-CM | POA: Insufficient documentation

## 2014-02-02 DIAGNOSIS — N400 Enlarged prostate without lower urinary tract symptoms: Secondary | ICD-10-CM | POA: Insufficient documentation

## 2014-02-02 DIAGNOSIS — Y929 Unspecified place or not applicable: Secondary | ICD-10-CM | POA: Insufficient documentation

## 2014-02-02 DIAGNOSIS — Z860100 Personal history of colon polyps, unspecified: Secondary | ICD-10-CM | POA: Insufficient documentation

## 2014-02-02 DIAGNOSIS — Z85828 Personal history of other malignant neoplasm of skin: Secondary | ICD-10-CM | POA: Insufficient documentation

## 2014-02-02 DIAGNOSIS — X500XXA Overexertion from strenuous movement or load, initial encounter: Secondary | ICD-10-CM | POA: Insufficient documentation

## 2014-02-02 DIAGNOSIS — Z8619 Personal history of other infectious and parasitic diseases: Secondary | ICD-10-CM | POA: Insufficient documentation

## 2014-02-02 DIAGNOSIS — I4891 Unspecified atrial fibrillation: Secondary | ICD-10-CM | POA: Insufficient documentation

## 2014-02-02 DIAGNOSIS — S8990XA Unspecified injury of unspecified lower leg, initial encounter: Secondary | ICD-10-CM | POA: Insufficient documentation

## 2014-02-02 DIAGNOSIS — M79605 Pain in left leg: Secondary | ICD-10-CM

## 2014-02-02 DIAGNOSIS — Z862 Personal history of diseases of the blood and blood-forming organs and certain disorders involving the immune mechanism: Secondary | ICD-10-CM | POA: Insufficient documentation

## 2014-02-02 DIAGNOSIS — Y939 Activity, unspecified: Secondary | ICD-10-CM | POA: Insufficient documentation

## 2014-02-02 DIAGNOSIS — E785 Hyperlipidemia, unspecified: Secondary | ICD-10-CM | POA: Insufficient documentation

## 2014-02-02 DIAGNOSIS — I10 Essential (primary) hypertension: Secondary | ICD-10-CM | POA: Insufficient documentation

## 2014-02-02 DIAGNOSIS — Z7901 Long term (current) use of anticoagulants: Secondary | ICD-10-CM | POA: Insufficient documentation

## 2014-02-02 DIAGNOSIS — Z87891 Personal history of nicotine dependence: Secondary | ICD-10-CM | POA: Insufficient documentation

## 2014-02-02 DIAGNOSIS — S99919A Unspecified injury of unspecified ankle, initial encounter: Principal | ICD-10-CM

## 2014-02-02 DIAGNOSIS — Z9889 Other specified postprocedural states: Secondary | ICD-10-CM | POA: Insufficient documentation

## 2014-02-02 DIAGNOSIS — Z79899 Other long term (current) drug therapy: Secondary | ICD-10-CM | POA: Insufficient documentation

## 2014-02-02 DIAGNOSIS — Z8719 Personal history of other diseases of the digestive system: Secondary | ICD-10-CM | POA: Insufficient documentation

## 2014-02-02 HISTORY — DX: Unspecified malignant neoplasm of skin, unspecified: C44.90

## 2014-02-02 MED ORDER — TRAMADOL HCL 50 MG PO TABS: 50.0000 mg | ORAL_TABLET | Freq: Four times a day (QID) | ORAL | Status: DC | PRN

## 2014-02-02 NOTE — ED Provider Notes (Signed)
CSN: [id number]     Arrival date & time 02/02/14  1350 History   First MD Initiated Contact with Patient 02/02/14 1524     Chief Complaint  Patient presents with  . Leg Swelling     (Consider location/radiation/quality/duration/timing/severity/associated sxs/prior Treatment) The history is provided by the patient.   patient 79 year old male with a left calf pain some swelling in the states and redness since March 31 when he felt a pop in the calf area. Patient's lungs are rolled for atrial fib. No traumatic event or injury to the leg. No shortness of breath no chest pain. No headache. No history of similar pain. No complaint of the Achilles tendon being painful.  Past Medical History  Diagnosis Date  . Hypertension   . Normal coronary arteries 2002  . Hyperlipemia   . Seasonal allergies   . Enlarged prostate     BPH - PSA of 6.7 this is consistant with his last PSA of 6.3  . Peripheral neuropathy   . Intestinal polyps 09/22/11    Colonoscopy removed a 2 mm sessile cecal polyp with a cold snare.  . Tinnitus   . Measles   . Mumps   . Pneumonia   . Inguinal hernia     Inguinal hernia repair (x) 2 1991, 2011  . Umbilical hernia   . Vitamin D deficiency   . Thrombocytopenia 2011    Very mild with platelets of 135,000.  [name] PAF (paroxysmal atrial fibrillation)     Transesophageal echo on 07/07/12 - EF =50-55%. Mild atheromatous disease of the proximal descending aorta. Cadioversion 07/07/12 successful.  . Atypical chest pain     Myoview performed 07/23/11 was completely normal. Post stress EF 61%.  . Bruit     Left asymptomatic bruit. Carotid Duplex 12/13/12 =mildly abnormal. *BILATERAL BULB/PROXIMAL ICAs: Demonstrated a mild amount of fibrous plaque w/no evidence od significant diameter reduction, tortuosity or other vascular abnormality.  . Skin cancer    Past Surgical History  Procedure Laterality Date  . Tee without cardioversion  07/07/2012    Procedure: TRANSESOPHAGEAL  ECHOCARDIOGRAM (TEE);  Surgeon: Pixie Casino, MD;  Location: [facility] ENDOSCOPY;  Service: Cardiovascular;  Laterality: N/A;  . Cardioversion  07/07/2012    A fib - Cardioversion successful.  . Colonoscopy w/ polypectomy  09/22/11    2 mm sessile cecal polyp was removed with a cold snare.  . Inguinal hernia repair  1991 & 2011  . Cardiac catheterization  2002    "Normal cardiac catheterization"  . Carotid duplex  12/13/12    For asymptomatic bruit. Mildly abnormal.  *BILATERAL BULB/PROXIMAL ICAs: Demonstrated a mild amount of fibrous plaque w/no evidence of significant diameter reduction, tortuosity or other vascular abnormality.   . Cardioversion N/A 06/30/2013    Procedure: CARDIOVERSION;  Surgeon: Sanda Klein, MD;  Location: [facility] OR;  Service: Cardiovascular;  Laterality: N/A;  . Hand surgery     Family History  Problem Relation Age of Onset  . Heart failure Brother    History  Substance Use Topics  . Smoking status: Former Smoker    Types: Cigarettes    Quit date: 11/03/1963  . Smokeless tobacco: Never Used  . Alcohol Use: No    Review of Systems  Constitutional: Negative for fever.  HENT: Negative for congestion.   Eyes: Negative for redness.  Respiratory: Negative for shortness of breath.   Cardiovascular: Negative for chest pain.  Gastrointestinal: Negative for abdominal pain.  Genitourinary: Negative for dysuria.  Musculoskeletal: Positive for  myalgias. Negative for back pain, joint swelling and neck pain.  Skin: Negative for rash.  Neurological: Negative for headaches.  Hematological: Bruises/bleeds easily.  Psychiatric/Behavioral: Negative for confusion.      Allergies  Prednisone and Doxycycline  Home Medications   Current Outpatient Rx  Name  Route  Sig  Dispense  Refill  . amiodarone (PACERONE) 200 MG tablet   Oral   Take 1 tablet (200 mg total) by mouth 2 (two) times daily. Or as directed   180 tablet   2   . cetirizine (ZYRTEC) 10 MG tablet   Oral    Take 10 mg by mouth as needed for allergies.         [name] doxazosin (CARDURA) 8 MG tablet   Oral   Take 1 tablet (8 mg total) by mouth at bedtime.   90 tablet   3   . furosemide (LASIX) 20 MG tablet   Oral   Take 20 mg by mouth as needed for fluid.         [name] lisinopril (PRINIVIL,ZESTRIL) 2.5 MG tablet   Oral   Take 2.5 mg by mouth daily as needed (blood pressure).          . Multiple Vitamins-Minerals (ICAPS) CAPS   Oral   Take 1 capsule by mouth daily.         . OMEGA-3 KRILL OIL PO   Oral   Take 1 capsule by mouth daily.         [name] [name] (SYSTANE OP)   Both Eyes   Place 1 drop into both eyes daily.          . psyllium (METAMUCIL) 58.6 % powder   Oral   Take 1 packet by mouth as needed (constipation).          . rivaroxaban (XARELTO) 10 MG TABS tablet   Oral   Take 20 mg by mouth daily.         . simvastatin (ZOCOR) 5 MG tablet   Oral   Take 5 mg by mouth at bedtime.         . traMADol (ULTRAM) 50 MG tablet   Oral   Take 1 tablet (50 mg total) by mouth every 6 (six) hours as needed.   20 tablet   0   . Vitamin D, Ergocalciferol, ([name]) 50000 UNITS CAPS   Oral   Take 50,000 Units by mouth every 7 (seven) days.          BP 151/67  Pulse 58  Temp(Src) 98.3 F (36.8 C) (Oral)  Resp 20  Ht 5\' 11"  (1.803 m)  Wt 190 lb (86.183 kg)  BMI 26.51 kg/m2  SpO2 100% Physical Exam  Nursing note and vitals reviewed. Constitutional: He is oriented to person, place, and time. He appears well-developed and well-nourished. No distress.  HENT:  Head: Normocephalic and atraumatic.  Eyes: Conjunctivae are normal. Pupils are equal, round, and reactive to light.  Neck: Normal range of motion. Neck supple.  Cardiovascular: Normal rate, regular rhythm and normal heart sounds.   Pulmonary/Chest: Effort normal and breath sounds normal. No respiratory distress.  Abdominal: Soft. Bowel sounds are normal. There is no tenderness.   Musculoskeletal: He exhibits edema and tenderness.  Left calf with some mild swelling no real erythema mild tenderness no Achilles tendon tenderness. Good cap refill distally. No swelling to the knee. No palpable cord. Patient does have some varicose veins. But no contusion or ecchymosis.  Neurological: He  is alert and oriented to person, place, and time. No cranial nerve deficit. He exhibits normal muscle tone. Coordination normal.  Skin: Skin is warm.    ED Course  Procedures (including critical care time) Labs Review Labs Reviewed - No data to display Imaging Review US Venous Img Lower Unilateral Left  02/02/2014   CLINICAL DATA:  Swelling in the upper calf. Symptoms started on Tuesday after walking. Felt/ heard a pop. The patient is on Xarelto.  EXAM: LEFT LOWER EXTREMITY VENOUS DOPPLER ULTRASOUND  TECHNIQUE: Gray-scale sonography with graded compression, as well as color Doppler and duplex ultrasound, were performed to evaluate the deep venous system from the level of the common femoral vein through the popliteal and proximal calf veins. Spectral Doppler was utilized to evaluate flow at rest and with distal augmentation maneuvers.  COMPARISON:  None.  FINDINGS: Thrombus within deep veins:  None visualized.  Compressibility of deep veins:  Normal.  Duplex waveform respiratory phasicity:  Normal.  Duplex waveform response to augmentation:  Normal.  Venous reflux:  None visualized.  Other findings:  None visualized.  IMPRESSION: No evidence for occlusive deep venous thrombosis.   Electronically Signed   By: Shon Hale M.D.   On: 02/02/2014 14:41     EKG Interpretation None      MDM   Final diagnoses:  Leg pain, left    Patient with left calf pain that started on March 31 he felt a pop has some swelling there slight redness. No direct injury. Patients on his role to 4 atrial fibrillation. Doppler studies negative for DVT. Suspect patient had a calf muscle pull or perhaps a pop to an  accessory tendon there is no Achilles tendon tenderness. Patient able to ambulate. Will treat with Tylenol followup with his doctor.  Mervin Kung, MD 02/02/14 [id number]

## 2014-02-02 NOTE — ED Notes (Signed)
Pt states on 3/31 flet like a rubber band pooped him in the left calf-swelling, redness since then-denies direct injury

## 2014-02-02 NOTE — Discharge Instructions (Signed)
No evidence of deep vein thrombosis here today. Ultrasound was negative. Suspect that she pulled something in the calf. Take the pain medicine as needed wouldn't expect it to be better in one or 2 weeks if he gets worse followup if not better in one or 2 weeks followup with your regular Dr.

## 2014-02-02 NOTE — ED Notes (Signed)
MD at bedside. 

## 2014-02-23 ENCOUNTER — Ambulatory Visit (INDEPENDENT_AMBULATORY_CARE_PROVIDER_SITE_OTHER): Payer: No Typology Code available for payment source | Admitting: General Surgery

## 2014-03-19 ENCOUNTER — Encounter (INDEPENDENT_AMBULATORY_CARE_PROVIDER_SITE_OTHER): Payer: Self-pay | Admitting: General Surgery

## 2014-03-19 ENCOUNTER — Ambulatory Visit (INDEPENDENT_AMBULATORY_CARE_PROVIDER_SITE_OTHER): Payer: Medicare Other | Admitting: General Surgery

## 2014-03-19 VITALS — BP 120/65 | HR 56 | Temp 98.4°F | Resp 14 | Ht 71.5 in | Wt 196.6 lb

## 2014-03-19 DIAGNOSIS — R1030 Lower abdominal pain, unspecified: Secondary | ICD-10-CM

## 2014-03-19 DIAGNOSIS — R109 Unspecified abdominal pain: Secondary | ICD-10-CM

## 2014-03-19 NOTE — Progress Notes (Signed)
Chief complaint: Left groin pain  History: Patient returns to the office with history of laparoscopic repair of left inguinal hernia in 2011. He states for about 2 months he has had pain in his left groin. He describes a stinging or burning pain which she localizes really below the inguinal ligament medially toward the anterior perineum and base of the scrotum. It's related to sitting in position. He has not noted any definite lump or bulge. He denies any GI symptoms. Colonoscopy is up-to-date. Denies urinary symptoms.  Exam: BP 120/65  Pulse 56  Temp(Src) 98.4 F (36.9 C)  Resp 14  Ht 5' 11.5" (1.816 m)  Wt 196 lb 9.6 oz (89.177 kg)  BMI 27.04 kg/m2 General: Appears well Abdomen: I do not feel any bulge in the left groin with coughing and straining and Valsalva maneuver GU: No palpable scrotal or testicular masses. There is a prominent dilated vein posteriorly on the proximal scrotum. Perineum and perianal area unremarkable  Assessment and plan: Left groin pain post hernia repair. I did not see any evidence of recurrent hernia. He could have some nerve irritation from scar tissue. He sees Dr.Otlein rectally for prostate followup. I asked him to call him for an evaluation to see if he has any other thoughts regarding a GU source. We will observe for symptomatic management for now and if he is not any better and 2 months he will return for repeat evaluation.

## 2014-07-06 ENCOUNTER — Encounter: Payer: Self-pay | Admitting: Physician Assistant

## 2014-07-06 ENCOUNTER — Ambulatory Visit (INDEPENDENT_AMBULATORY_CARE_PROVIDER_SITE_OTHER): Payer: Medicare Other | Admitting: Physician Assistant

## 2014-07-06 VITALS — BP 110/50 | HR 51 | Ht 71.0 in | Wt 194.1 lb

## 2014-07-06 DIAGNOSIS — E785 Hyperlipidemia, unspecified: Secondary | ICD-10-CM

## 2014-07-06 DIAGNOSIS — I4891 Unspecified atrial fibrillation: Secondary | ICD-10-CM

## 2014-07-06 DIAGNOSIS — I480 Paroxysmal atrial fibrillation: Secondary | ICD-10-CM

## 2014-07-06 DIAGNOSIS — Z7901 Long term (current) use of anticoagulants: Secondary | ICD-10-CM

## 2014-07-06 DIAGNOSIS — IMO0001 Reserved for inherently not codable concepts without codable children: Secondary | ICD-10-CM

## 2014-07-06 DIAGNOSIS — Z0389 Encounter for observation for other suspected diseases and conditions ruled out: Secondary | ICD-10-CM

## 2014-07-06 MED ORDER — RIVAROXABAN 20 MG PO TABS: 20.0000 mg | ORAL_TABLET | Freq: Every day | ORAL | Status: DC

## 2014-07-06 NOTE — Assessment & Plan Note (Signed)
Maintain sinus bradycardia with first degree AV block heart rate 51 beats per minute. Continue amiodarone

## 2014-07-06 NOTE — Assessment & Plan Note (Signed)
Continue statin. 

## 2014-07-06 NOTE — Assessment & Plan Note (Signed)
We provided samples.

## 2014-07-06 NOTE — Patient Instructions (Signed)
1.  Follow up with Dr. Berry in 6 months. 

## 2014-07-06 NOTE — Progress Notes (Signed)
Date:  07/06/2014   ID:  Stanley Waters, DOB 07-Jan-1935, MRN [id number]  PCP:  Stanley Sheller, MD  Primary Cardiologist:  Stanley Waters    History of Present Illness: Stanley Waters is a 79 y.o. male mildly overweight widowed Caucasian male father of 65, grandfather of 2 grandchildren. He has a history of normal coronary arteries by cath in 2002. He has PAF and has undergone a transesophageal echo, most recently by Dr. Mali Waters July 07, 2012. His other problems include hypertension and hyperlipidemia. Dr. Gwenlyn Waters adjusted his medications because he was bradycardic. Eliquis changed to Xarelto at his request because of cost. He apparently developed a rash which he attributes to the Xarelto.  This is resolved. He was admitted to the hospital on 06/29/13 with A. Fib with RVR. Ultimately underwent bedside DC cardioversion by Dr. Sallyanne Waters successfully to sinus rhythm after being switched from Rythmol to amiodarone. Since discharge he had multiple episodes of right true PAF responsive to when necessary supplemental metoprolol. He wore an event monitor for 2 weeks but did show episodes of bradycardia.  He remains on amiodarone.  He does take 2.5 lisinopril when necessary.  The patient presents today for a six-month evaluation. Reports some infrequent dizziness which she attributes to hypotension. He says he said the blood pressure at one point it was ~ 85/50 but it has been several months since this occurred.  He otherwise feels quite well.  He currently denies nausea, vomiting, fever, chest pain, shortness of breath, orthopnea,  PND, cough, congestion, abdominal pain, hematochezia, melena, lower extremity edema, claudication.  Wt Readings from Last 3 Encounters:  07/06/14 194 lb 1.6 oz (88.043 kg)  03/19/14 196 lb 9.6 oz (89.177 kg)  02/02/14 190 lb (86.183 kg)     Past Medical History  Diagnosis Date  . Hypertension   . Normal coronary arteries 2002  . Hyperlipemia   . Seasonal allergies   .  Enlarged prostate     BPH - PSA of 6.7 this is consistant with his last PSA of 6.3  . Peripheral neuropathy   . Intestinal polyps 09/22/11    Colonoscopy removed a 2 mm sessile cecal polyp with a cold snare.  . Tinnitus   . Measles   . Mumps   . Pneumonia   . Inguinal hernia     Inguinal hernia repair (x) 2 1991, 2011  . Umbilical hernia   . Vitamin D deficiency   . Thrombocytopenia 2011    Very mild with platelets of 135,000.  Stanley Waters PAF (paroxysmal atrial fibrillation)     Transesophageal echo on 07/07/12 - EF =50-55%. Mild atheromatous disease of the proximal descending aorta. Cadioversion 07/07/12 successful.  . Atypical chest pain     Myoview performed 07/23/11 was completely normal. Post stress EF 61%.  . Bruit     Left asymptomatic bruit. Carotid Duplex 12/13/12 =mildly abnormal. *BILATERAL BULB/PROXIMAL ICAs: Demonstrated a mild amount of fibrous plaque w/no evidence od significant diameter reduction, tortuosity or other vascular abnormality.  . Skin cancer     Current Outpatient Prescriptions  Medication Sig Dispense Refill  . amiodarone (PACERONE) 200 MG tablet Take 1 tablet (200 mg total) by mouth 2 (two) times daily. Or as directed  180 tablet  2  . doxazosin (CARDURA) 8 MG tablet Take 1 tablet (8 mg total) by mouth at bedtime.  90 tablet  3  . furosemide (LASIX) 20 MG tablet Take 20 mg by mouth as needed for fluid.      Stanley Waters  lisinopril (PRINIVIL,ZESTRIL) 2.5 MG tablet Take 2.5 mg by mouth daily as needed (blood pressure).       . Multiple Vitamins-Minerals (ICAPS) CAPS Take 1 capsule by mouth daily.      . OMEGA-3 KRILL OIL PO Take 1 capsule by mouth daily.      Stanley Waters [name] (SYSTANE OP) Place 1 drop into both eyes daily.       . psyllium (METAMUCIL) 58.6 % powder Take 1 packet by mouth as needed (constipation).       . rivaroxaban (XARELTO) 20 MG TABS tablet Take 1 tablet (20 mg total) by mouth daily with supper.  20 tablet  0  . simvastatin (ZOCOR) 5 MG tablet  Take 5 mg by mouth at bedtime.      . sulfamethoxazole-trimethoprim (BACTRIM DS) 800-160 MG per tablet Take 1 tablet by mouth 2 (two) times daily.       Stanley Waters triamcinolone cream (KENALOG) 0.1 %       . Vitamin D, Ergocalciferol, ([name]) 50000 UNITS CAPS Take 50,000 Units by mouth every 7 (seven) days.       No current facility-administered medications for this visit.    Allergies:    Allergies  Allergen Reactions  . Prednisone Swelling, Other (See Comments) and Hypertension    Tachycardia  . Doxycycline Rash    Social History:  The patient  reports that he quit smoking about 50 years ago. His smoking use included Cigarettes. He smoked 0.00 packs per day. He has never used smokeless tobacco. He reports that he does not drink alcohol or use illicit drugs.   Family history:   Family History  Problem Relation Age of Onset  . Heart failure Brother     ROS:  Please see the history of present illness.  All other systems reviewed and negative.   PHYSICAL EXAM: VS:  BP 110/50  Pulse 51  Ht 5\' 11"  (1.803 m)  Wt 194 lb 1.6 oz (88.043 kg)  BMI 27.08 kg/m2 Well nourished, well developed, in no acute distress HEENT: Pupils are equal round react to light accommodation extraocular movements are intact.  Neck: no JVDNo cervical lymphadenopathy. Cardiac: Regular rate and rhythm without murmurs rubs or gallops. Lungs:  clear to auscultation bilaterally, no wheezing, rhonchi or rales Abd: soft, nontender, positive bowel sounds all quadrants, no hepatosplenomegaly Ext: no lower extremity edema.  2+ radial and dorsalis pedis pulses. Skin: warm and dry Neuro:  Grossly normal  EKG:  Sinus bradycardia rate 51 beats per minute first degree AV block    ASSESSMENT AND PLAN:  Problem List Items Addressed This Visit   Normal coronary arteries 2002 (Chronic)   Paroxysmal atrial fibrillation     Maintain sinus bradycardia with first degree AV block heart rate 51 beats per minute. Continue  amiodarone    Relevant Medications      rivaroxaban (XARELTO) tablet   Hyperlipidemia     Continue statin    Relevant Medications      rivaroxaban (XARELTO) tablet   Chronic anticoagulation -Xarelto     We provided samples.     Other Visit Diagnoses   PAF (paroxysmal atrial fibrillation)    -  Primary    Relevant Medications       rivaroxaban (XARELTO) tablet    Other Relevant Orders       EKG 12-Lead

## 2014-09-19 ENCOUNTER — Other Ambulatory Visit: Payer: Self-pay | Admitting: Cardiovascular Disease

## 2014-09-19 NOTE — Telephone Encounter (Signed)
Rx refill sent to patient pharmacy   

## 2014-09-21 ENCOUNTER — Other Ambulatory Visit: Payer: Self-pay | Admitting: Pharmacist Clinician (PhC)/ Clinical Pharmacy Specialist

## 2014-09-21 MED ORDER — RIVAROXABAN 20 MG PO TABS: 20.0000 mg | ORAL_TABLET | Freq: Every day | ORAL | Status: DC

## 2014-09-26 ENCOUNTER — Other Ambulatory Visit: Payer: Self-pay | Admitting: Pharmacist Clinician (PhC)/ Clinical Pharmacy Specialist

## 2014-09-26 MED ORDER — RIVAROXABAN 20 MG PO TABS: 20.0000 mg | ORAL_TABLET | Freq: Every day | ORAL | Status: DC

## 2014-11-27 ENCOUNTER — Other Ambulatory Visit: Payer: Self-pay | Admitting: *Deleted

## 2014-11-27 MED ORDER — AMIODARONE HCL 200 MG PO TABS: ORAL_TABLET | ORAL | Status: DC

## 2014-11-29 ENCOUNTER — Other Ambulatory Visit: Payer: Self-pay

## 2014-11-29 MED ORDER — AMIODARONE HCL 200 MG PO TABS: ORAL_TABLET | ORAL | Status: DC

## 2014-11-29 NOTE — Telephone Encounter (Signed)
Rx sent to pharmacy   

## 2014-12-12 DIAGNOSIS — I2510 Atherosclerotic heart disease of native coronary artery without angina pectoris: Secondary | ICD-10-CM | POA: Diagnosis not present

## 2014-12-12 DIAGNOSIS — J019 Acute sinusitis, unspecified: Secondary | ICD-10-CM | POA: Diagnosis not present

## 2014-12-12 DIAGNOSIS — E785 Hyperlipidemia, unspecified: Secondary | ICD-10-CM | POA: Diagnosis not present

## 2015-01-02 ENCOUNTER — Encounter: Payer: Self-pay | Admitting: Cardiovascular Disease

## 2015-01-02 ENCOUNTER — Ambulatory Visit (INDEPENDENT_AMBULATORY_CARE_PROVIDER_SITE_OTHER): Payer: Commercial Managed Care - HMO | Admitting: Cardiovascular Disease

## 2015-01-02 VITALS — BP 130/70 | HR 49 | Ht 71.0 in | Wt 197.2 lb

## 2015-01-02 DIAGNOSIS — I480 Paroxysmal atrial fibrillation: Secondary | ICD-10-CM | POA: Diagnosis not present

## 2015-01-02 DIAGNOSIS — E785 Hyperlipidemia, unspecified: Secondary | ICD-10-CM

## 2015-01-02 NOTE — Assessment & Plan Note (Signed)
History of paroxysmal atrial fibrillation maintaining sinus rhythm on amiodarone and Xarelto  oral anticoagulation

## 2015-01-02 NOTE — Patient Instructions (Signed)
We request that you follow-up in: 6 months with Gaspar Bidding, PA-C and in 12 months with Dr [name].  You will receive a reminder letter in the mail two months in advance. If you don't receive a letter, please call our office to schedule the follow-up appointment.

## 2015-01-02 NOTE — Progress Notes (Signed)
01/02/2015 [facility]   05/12/1935  [id number]  Primary Physician [name], MD Primary Cardiologist: Lorretta Harp MD Stanley Waters   HPI:The patient is a very pleasant 80 year old mildly overweight widowed Caucasian male father of 58, grandfather of 2 grandchildren who I last saw 12 months ago. He has a history of normal coronary arteries by cath in 2002. He has PAF and has undergone a transesophageal echo, most recently by Dr. [name] July 07, 2012. His other problems include hypertension and hyperlipidemia. I adjusted his medications because he was bradycardic when I last saw him and changed his Eliquis to Xarelto at his request because of cost. He apparently has developed a rash since that time which he attributes to the Xarelto. He was admitted to the hospital on 06/29/13 with A. Fib with RVR. Ultimately underwent bedside DC cardioversion by Dr. [name] successfully to sinus rhythm after being switched from Rythmol to amiodarone. Since discharge he had multiple episodes of right true PAF responsive to when necessary supplemental metoprolol. He wore an event monitor for 2 weeks but did show episodes of bradycardia.since I saw him last in September he has had no episodes of atrial fibrillation. He remains on amiodarone and verapamil. He did stop his lisinopril and metoprolol because of hypotension. He denies chest pain, shortness of breath or presyncope.   Current Outpatient Prescriptions  Medication Sig Dispense Refill  . amiodarone (PACERONE) 200 MG tablet TAKE ONE TABLET BY MOUTH TWICE DAILY OR  AS  DIRECTED 180 tablet 1  . doxazosin (CARDURA) 8 MG tablet Take 1 tablet (8 mg total) by mouth at bedtime. 90 tablet 3  . furosemide (LASIX) 20 MG tablet Take 20 mg by mouth as needed for fluid.    [name] lisinopril (PRINIVIL,ZESTRIL) 2.5 MG tablet Take 2.5 mg by mouth daily as needed (blood pressure).     . Multiple Vitamins-Minerals (ICAPS) CAPS Take 1 capsule by mouth  daily.    . OMEGA-3 KRILL OIL PO Take 1 capsule by mouth daily.    . psyllium (METAMUCIL) 58.6 % powder Take 1 packet by mouth as needed (constipation).     . rivaroxaban (XARELTO) 20 MG TABS tablet Take 1 tablet (20 mg total) by mouth daily with supper. 90 tablet 1  . simvastatin (ZOCOR) 5 MG tablet Take 5 mg by mouth at bedtime.    . triamcinolone cream (KENALOG) 0.1 %     . Vitamin D, Ergocalciferol, ([name]) 50000 UNITS CAPS Take 50,000 Units by mouth every 7 (seven) days.     No current facility-administered medications for this visit.    Allergies  Allergen Reactions  . Prednisone Swelling, Other (See Comments) and Hypertension    Tachycardia  . Doxycycline Rash    History   Social History  . Marital Status: Widowed    Spouse Name: N/A  . Number of Children: 2  . Years of Education: N/A   Occupational History  .      Road Architect   Social History Main Topics  . Smoking status: Former Smoker    Types: Cigarettes    Quit date: 11/03/1963  . Smokeless tobacco: Never Used  . Alcohol Use: No  . Drug Use: No  . Sexual Activity: Not on file   Other Topics Concern  . Not on file   Social History Narrative     Review of Systems: General: negative for chills, fever, night sweats or weight changes.  Cardiovascular: negative for chest pain, dyspnea on exertion,  edema, orthopnea, palpitations, paroxysmal nocturnal dyspnea or shortness of breath Dermatological: negative for rash Respiratory: negative for cough or wheezing Urologic: negative for hematuria Abdominal: negative for nausea, vomiting, diarrhea, bright red blood per rectum, melena, or hematemesis Neurologic: negative for visual changes, syncope, or dizziness All other systems reviewed and are otherwise negative except as noted above.    Blood pressure 130/70, pulse 49, height 5\' 11"  (1.803 m), weight 197 lb 3.2 oz (89.449 kg).  General appearance: alert and no distress Neck: no adenopathy, no carotid  bruit, no JVD, supple, symmetrical, trachea midline and thyroid not enlarged, symmetric, no tenderness/mass/nodules Lungs: clear to auscultation bilaterally Heart: regular rate and rhythm, S1, S2 normal, no murmur, click, rub or gallop Extremities: extremities normal, atraumatic, no cyanosis or edema  EKG sinus bradycardia of 49 with occasional PVCs. Presently reviewed this EKG  ASSESSMENT AND PLAN:   Paroxysmal atrial fibrillation History of paroxysmal atrial fibrillation maintaining sinus rhythm on amiodarone and Xarelto  oral anticoagulation   Hyperlipidemia History of hyperlipidemia on simvastatin 5 mg a day followed by his PCP. We will make sure that he's had liver function test performed recently as well given that he is on amiodarone       Lorretta Harp MD [facility] 01/02/2015 11:03 AM

## 2015-01-02 NOTE — Assessment & Plan Note (Signed)
History of hyperlipidemia on simvastatin 5 mg a day followed by his PCP. We will make sure that he's had liver function test performed recently as well given that he is on amiodarone

## 2015-01-22 DIAGNOSIS — N401 Enlarged prostate with lower urinary tract symptoms: Secondary | ICD-10-CM | POA: Diagnosis not present

## 2015-01-22 DIAGNOSIS — R972 Elevated prostate specific antigen [PSA]: Secondary | ICD-10-CM | POA: Diagnosis not present

## 2015-01-22 DIAGNOSIS — R351 Nocturia: Secondary | ICD-10-CM | POA: Diagnosis not present

## 2015-01-22 DIAGNOSIS — R3915 Urgency of urination: Secondary | ICD-10-CM | POA: Diagnosis not present

## 2015-01-22 DIAGNOSIS — N138 Other obstructive and reflux uropathy: Secondary | ICD-10-CM | POA: Diagnosis not present

## 2015-01-30 DIAGNOSIS — I5032 Chronic diastolic (congestive) heart failure: Secondary | ICD-10-CM | POA: Diagnosis not present

## 2015-01-30 DIAGNOSIS — Z125 Encounter for screening for malignant neoplasm of prostate: Secondary | ICD-10-CM | POA: Diagnosis not present

## 2015-01-30 DIAGNOSIS — E559 Vitamin D deficiency, unspecified: Secondary | ICD-10-CM | POA: Diagnosis not present

## 2015-01-30 DIAGNOSIS — E663 Overweight: Secondary | ICD-10-CM | POA: Diagnosis not present

## 2015-01-30 DIAGNOSIS — I2510 Atherosclerotic heart disease of native coronary artery without angina pectoris: Secondary | ICD-10-CM | POA: Diagnosis not present

## 2015-01-30 DIAGNOSIS — Z0000 Encounter for general adult medical examination without abnormal findings: Secondary | ICD-10-CM | POA: Diagnosis not present

## 2015-01-30 DIAGNOSIS — Z1389 Encounter for screening for other disorder: Secondary | ICD-10-CM | POA: Diagnosis not present

## 2015-01-30 DIAGNOSIS — I10 Essential (primary) hypertension: Secondary | ICD-10-CM | POA: Diagnosis not present

## 2015-01-30 DIAGNOSIS — E785 Hyperlipidemia, unspecified: Secondary | ICD-10-CM | POA: Diagnosis not present

## 2015-01-30 DIAGNOSIS — M109 Gout, unspecified: Secondary | ICD-10-CM | POA: Diagnosis not present

## 2015-01-30 DIAGNOSIS — Z0001 Encounter for general adult medical examination with abnormal findings: Secondary | ICD-10-CM | POA: Diagnosis not present

## 2015-01-30 DIAGNOSIS — M8580 Other specified disorders of bone density and structure, unspecified site: Secondary | ICD-10-CM | POA: Diagnosis not present

## 2015-02-07 DIAGNOSIS — M8580 Other specified disorders of bone density and structure, unspecified site: Secondary | ICD-10-CM | POA: Diagnosis not present

## 2015-02-07 DIAGNOSIS — M109 Gout, unspecified: Secondary | ICD-10-CM | POA: Diagnosis not present

## 2015-02-07 DIAGNOSIS — I5032 Chronic diastolic (congestive) heart failure: Secondary | ICD-10-CM | POA: Diagnosis not present

## 2015-02-07 DIAGNOSIS — I10 Essential (primary) hypertension: Secondary | ICD-10-CM | POA: Diagnosis not present

## 2015-02-07 DIAGNOSIS — I480 Paroxysmal atrial fibrillation: Secondary | ICD-10-CM | POA: Diagnosis not present

## 2015-02-07 DIAGNOSIS — N182 Chronic kidney disease, stage 2 (mild): Secondary | ICD-10-CM | POA: Diagnosis not present

## 2015-02-07 DIAGNOSIS — E785 Hyperlipidemia, unspecified: Secondary | ICD-10-CM | POA: Diagnosis not present

## 2015-02-07 DIAGNOSIS — I2510 Atherosclerotic heart disease of native coronary artery without angina pectoris: Secondary | ICD-10-CM | POA: Diagnosis not present

## 2015-04-19 ENCOUNTER — Other Ambulatory Visit: Payer: Self-pay | Admitting: Cardiovascular Disease

## 2015-04-19 NOTE — Telephone Encounter (Signed)
Rx(s) sent to pharmacy electronically.  

## 2015-04-24 ENCOUNTER — Encounter: Payer: Self-pay | Admitting: Cardiology

## 2015-05-15 DIAGNOSIS — L718 Other rosacea: Secondary | ICD-10-CM | POA: Diagnosis not present

## 2015-05-15 DIAGNOSIS — D692 Other nonthrombocytopenic purpura: Secondary | ICD-10-CM | POA: Diagnosis not present

## 2015-05-15 DIAGNOSIS — L570 Actinic keratosis: Secondary | ICD-10-CM | POA: Diagnosis not present

## 2015-05-15 DIAGNOSIS — Z85828 Personal history of other malignant neoplasm of skin: Secondary | ICD-10-CM | POA: Diagnosis not present

## 2015-06-27 DIAGNOSIS — H3531 Nonexudative age-related macular degeneration: Secondary | ICD-10-CM | POA: Diagnosis not present

## 2015-06-27 DIAGNOSIS — H35033 Hypertensive retinopathy, bilateral: Secondary | ICD-10-CM | POA: Diagnosis not present

## 2015-06-27 DIAGNOSIS — H01003 Unspecified blepharitis right eye, unspecified eyelid: Secondary | ICD-10-CM | POA: Diagnosis not present

## 2015-06-27 DIAGNOSIS — Z961 Presence of intraocular lens: Secondary | ICD-10-CM | POA: Diagnosis not present

## 2015-06-28 ENCOUNTER — Encounter: Payer: Self-pay | Admitting: Cardiology

## 2015-06-28 ENCOUNTER — Ambulatory Visit (INDEPENDENT_AMBULATORY_CARE_PROVIDER_SITE_OTHER): Payer: Commercial Managed Care - HMO | Admitting: Cardiology

## 2015-06-28 VITALS — BP 120/56 | HR 56 | Ht 71.5 in | Wt 195.9 lb

## 2015-06-28 DIAGNOSIS — Z7901 Long term (current) use of anticoagulants: Secondary | ICD-10-CM

## 2015-06-28 DIAGNOSIS — R0602 Shortness of breath: Secondary | ICD-10-CM | POA: Diagnosis not present

## 2015-06-28 DIAGNOSIS — E782 Mixed hyperlipidemia: Secondary | ICD-10-CM

## 2015-06-28 DIAGNOSIS — R531 Weakness: Secondary | ICD-10-CM | POA: Diagnosis not present

## 2015-06-28 DIAGNOSIS — I480 Paroxysmal atrial fibrillation: Secondary | ICD-10-CM | POA: Diagnosis not present

## 2015-06-28 DIAGNOSIS — R42 Dizziness and giddiness: Secondary | ICD-10-CM | POA: Diagnosis not present

## 2015-06-28 MED ORDER — AMIODARONE HCL 200 MG PO TABS: 200.0000 mg | ORAL_TABLET | Freq: Every day | ORAL | Status: DC

## 2015-06-28 NOTE — Patient Instructions (Signed)
Medication Instructions:  DECREASE Amiodarone to 200 mg ONCE DAILY.   Labwork: NONE  Testing/Procedures: Cecilie Kicks, NP, has requested that you have a lexiscan myoview. For further information please visit [email or web address]. Please follow instruction sheet, as given.  Follow-Up: Cecilie Kicks, NP, recommends that you schedule a follow-up appointment in 3 months with Dr [name].   **Please call our office to speak with a nurse in ~2 weeks to let us know how you are feelings on decreased Amiodarone.

## 2015-06-28 NOTE — Progress Notes (Signed)
Cardiology Office Note   Date:  06/28/2015   ID:  CECILIA NISHIKAWA, DOB June 21, 1935, MRN [id number]  PCP:  [name], MD  Cardiologist:  Dr. [name]    Chief Complaint  Patient presents with  . ROV 6 months    patient reports shortness of breath with minimal exertion, weakness in legs, heart beating slow- reports low 40s, and more tired than usual.      History of Present Illness: Stanley Waters is a 80 y.o. male who presents for follow up for PAF.  He has a history of normal coronary arteries by cath in 2002. He has PAF and has undergone a transesophageal echo, most recently by Dr. [name] July 07, 2012. His other problems include hypertension and hyperlipidemia. His medications were adjusted because he was bradycardic when he saw Dr. [name]  and changed his Eliquis to Xarelto at his request because of cost. He apparently has developed a rash since that time which he attributes to the Xarelto but he continues xarelto and no rash now.  He was admitted to the hospital on 06/29/13 with A. Fib with RVR. Ultimately underwent bedside DC cardioversion by Dr. [name] successfully to sinus rhythm after being switched from Rythmol to amiodarone. Since that  discharge he had multiple episodes of  PAF responsive to when necessary supplemental metoprolol. He wore an event monitor for 2 weeks but did not show episodes of bradycardia.  He remains on amiodarone He did stop his metoprolol because of hypotension.  Today he has no chest pain.  He does have episodes of dizziness and feeling as if he will pass out.  He has SOB with this and weak legs.  Though the SOB and weak legs may occur without the dizziness.  No syncope and when he feels lightheaded he does sit down and symptoms resolve in 2-3 min.  Today HR is 50.   Past Medical History  Diagnosis Date  . Hypertension   . Normal coronary arteries 2002  . Hyperlipemia   . Seasonal allergies   . Enlarged prostate     BPH - PSA of 6.7 this  is consistant with his last PSA of 6.3  . Peripheral neuropathy   . Intestinal polyps 09/22/11    Colonoscopy removed a 2 mm sessile cecal polyp with a cold snare.  . Tinnitus   . Measles   . Mumps   . Pneumonia   . Inguinal hernia     Inguinal hernia repair (x) 2 1991, 2011  . Umbilical hernia   . Vitamin D deficiency   . Thrombocytopenia 2011    Very mild with platelets of 135,000.  [name] PAF (paroxysmal atrial fibrillation)     Transesophageal echo on 07/07/12 - EF =50-55%. Mild atheromatous disease of the proximal descending aorta. Cadioversion 07/07/12 successful.  . Atypical chest pain     Myoview performed 07/23/11 was completely normal. Post stress EF 61%.  . Bruit     Left asymptomatic bruit. Carotid Duplex 12/13/12 =mildly abnormal. *BILATERAL BULB/PROXIMAL ICAs: Demonstrated a mild amount of fibrous plaque w/no evidence od significant diameter reduction, tortuosity or other vascular abnormality.  . Skin cancer     Past Surgical History  Procedure Laterality Date  . Tee without cardioversion  07/07/2012    Procedure: TRANSESOPHAGEAL ECHOCARDIOGRAM (TEE);  Surgeon: Pixie Casino, MD;  Location: [facility] ENDOSCOPY;  Service: Cardiovascular;  Laterality: N/A;  . Cardioversion  07/07/2012    A fib - Cardioversion successful.  . Colonoscopy w/ polypectomy  09/22/11    2 mm sessile cecal polyp was removed with a cold snare.  . Inguinal hernia repair  1991 & 2011  . Cardiac catheterization  2002    "Normal cardiac catheterization"  . Carotid duplex  12/13/12    For asymptomatic bruit. Mildly abnormal.  *BILATERAL BULB/PROXIMAL ICAs: Demonstrated a mild amount of fibrous plaque w/no evidence of significant diameter reduction, tortuosity or other vascular abnormality.   . Cardioversion N/A 06/30/2013    Procedure: CARDIOVERSION;  Surgeon: Sanda Klein, MD;  Location: [facility] OR;  Service: Cardiovascular;  Laterality: N/A;  . Hand surgery       Current Outpatient Prescriptions  Medication Sig  Dispense Refill  . amiodarone (PACERONE) 200 MG tablet TAKE 1 TABLET TWICE DAILY  OR AS DIRECTED 180 tablet 2  . Cholecalciferol (VITAMIN D3) 50000 UNITS CAPS Take 1 capsule by mouth once a week.    . doxazosin (CARDURA) 8 MG tablet Take 1 tablet (8 mg total) by mouth at bedtime. 90 tablet 3  . furosemide (LASIX) 20 MG tablet Take 20 mg by mouth as needed for fluid.    [name] lisinopril (PRINIVIL,ZESTRIL) 2.5 MG tablet Take 2.5 mg by mouth daily as needed (blood pressure).     . OMEGA-3 KRILL OIL PO Take 1 capsule by mouth daily.    . psyllium (METAMUCIL) 58.6 % powder Take 1 packet by mouth as needed (constipation).     . rivaroxaban (XARELTO) 20 MG TABS tablet Take 1 tablet (20 mg total) by mouth daily with supper. 90 tablet 1  . simvastatin (ZOCOR) 5 MG tablet Take 5 mg by mouth at bedtime.    . triamcinolone cream (KENALOG) 0.1 %      No current facility-administered medications for this visit.    Allergies:   Prednisone and Doxycycline    Social History:  The patient  reports that he quit smoking about 51 years ago. His smoking use included Cigarettes. He has never used smokeless tobacco. He reports that he does not drink alcohol or use illicit drugs.   Family History:  The patient's family history includes Heart failure in his brother.    ROS:  General:no colds or fevers, no weight changes Skin:no rashes or ulcers HEENT:no blurred vision, no congestion CV:see HPI PUL:see HPI GI:no diarrhea constipation or melena, no indigestion GU:no hematuria, no dysuria MS:no joint pain, no claudication, did have some edema but with support stockings it has resolved. Neuro:no syncope, + lightheadedness Endo:no diabetes, no thyroid disease  Wt Readings from Last 3 Encounters:  06/28/15 195 lb 14.4 oz (88.86 kg)  01/02/15 197 lb 3.2 oz (89.449 kg)  07/06/14 194 lb 1.6 oz (88.043 kg)     PHYSICAL EXAM: VS:  BP 120/56 mmHg  Pulse 56  Ht 5' 11.5" (1.816 m)  Wt 195 lb 14.4 oz (88.86 kg)  BMI  26.94 kg/m2 , BMI Body mass index is 26.94 kg/(m^2). General:Pleasant affect, NAD Skin:Warm and dry, brisk capillary refill HEENT:normocephalic, sclera clear, mucus membranes moist Neck:supple, no JVD, no bruits  Heart:S1S2 RRR without murmur, gallup, rub or click Lungs:clear without rales, rhonchi, or wheezes JIR:CVEL, non tender, + BS, do not palpate liver spleen or masses Ext:no lower ext edema, 2+ pedal pulses, 2+ radial pulses Neuro:alert and oriented, MAE, follows commands, + facial symmetry  Ortho static BP checked and 10 mmhg drop from lying to sitting but otherwise increase in  Systolic BP.  No dizziness at that time.  EKG:  EKG is ordered today.  EKG with SB at 50 with 1st AV block pr 280 ms.  No acute changes from previous.   Recent Labs: No results found for requested labs within last 365 days.    Lipid Panel No results found for: CHOL, TRIG, HDL, CHOLHDL, VLDL, LDLCALC, LDLDIRECT     Other studies Reviewed: Additional studies/ records that were reviewed today include: previous EKGs.   ASSESSMENT AND PLAN:  1.  Dizziness episodic and bradycardia.  Discussed with Dr. Stanford Breed and we decreased amiodarone to once daily.  Discussed tachy-brady syndrome and with decrease of meds he may develop atrial fib.  If so he is to call us.  We recommended event monitor but he did not wish to wear.  I asked him if symptoms continue after 2 weeks to call us.  He may need even lower dose but I did discuss that he may need a PPM.  If medication too low he may develop a fib and to prevent a fib he would need medicine that causes his heart to be slow.  2.  PAF- continue xarelto   3. Leg weakness- not just with the dizziness, will check a lexiscan myoview, his heart rate is too slow to do treadmill and I do not believe we can increase his HR enough.  His last nuc study was 2012 and negative for ischemia.  4.last labs 2015 TSH WNL, will check with his PCP for more recent labs.  We will  schedule for follow up in 3 months with Dr. Adora Fridge but if symptoms persist he will need repeat visit in 2-3 weeks.   Current medicines are reviewed with the patient today.  The patient Has no concerns regarding medicines.  The following changes have been made:  See above Labs/ tests ordered today include:see above  Disposition:   FU:  see above  Signed, Isaiah Serge, NP  06/28/2015 2:10 PM    [facility] [place], [place], [place], [place] Phone: [phone]; Fax: [phone]

## 2015-07-02 ENCOUNTER — Ambulatory Visit: Payer: Commercial Managed Care - HMO | Admitting: Cardiology

## 2015-07-15 ENCOUNTER — Telehealth: Payer: Self-pay | Admitting: Cardiovascular Disease

## 2015-07-15 NOTE — Telephone Encounter (Signed)
Pt was calling to give you an update on his condition.His medicine dose was changed a few weeks ago.

## 2015-07-15 NOTE — Telephone Encounter (Signed)
Ok glad better, give the amiodarone more time out of system.  But could we move up his appt with Dr. Adora Fridge ?  First available, if not then APP in 3 weeks.

## 2015-07-15 NOTE — Telephone Encounter (Signed)
Appts reviewed - I don't see anything w/in 1 month but my access to schedule is limited. Note sent to scheduler. Pt informed, aware of pending call to schedule.

## 2015-07-15 NOTE — Telephone Encounter (Signed)
Spoke to patient.   Reports w/ recent adjustment to meds by Cecilie Kicks (reduced amiodarone dose) he has had improvement.  HR was in 58s, now in mid-50s. Lowest rate checked recently was 48.   He reports still fatigued, but improved. No other symptoms. Wanted to give update and see if anything further recommended.  Will route for advice.

## 2015-07-16 ENCOUNTER — Telehealth (HOSPITAL_COMMUNITY): Payer: Self-pay

## 2015-07-16 DIAGNOSIS — H43812 Vitreous degeneration, left eye: Secondary | ICD-10-CM | POA: Diagnosis not present

## 2015-07-16 NOTE — Telephone Encounter (Signed)
Encounter complete. 

## 2015-07-18 ENCOUNTER — Ambulatory Visit (HOSPITAL_COMMUNITY)
Admission: RE | Admit: 2015-07-18 | Discharge: 2015-07-18 | Disposition: A | Discharge status: Home / Self Care | Payer: Commercial Managed Care - HMO | Source: Ambulatory Visit | Attending: Cardiology | Admitting: Cardiology

## 2015-07-18 DIAGNOSIS — R0602 Shortness of breath: Secondary | ICD-10-CM | POA: Diagnosis not present

## 2015-07-18 DIAGNOSIS — R531 Weakness: Secondary | ICD-10-CM | POA: Diagnosis not present

## 2015-07-18 MED ORDER — REGADENOSON 0.4 MG/5ML IV SOLN
0.4000 mg | Freq: Once | INTRAVENOUS | Status: AC
  Administered 2015-07-18: 0.4 mg via INTRAVENOUS

## 2015-07-18 MED ORDER — TECHNETIUM TC 99M SESTAMIBI GENERIC - CARDIOLITE
32.0000 | Freq: Once | INTRAVENOUS | Status: AC | PRN
  Administered 2015-07-18: 32 via INTRAVENOUS

## 2015-07-18 MED ORDER — TECHNETIUM TC 99M SESTAMIBI GENERIC - CARDIOLITE
10.8000 | Freq: Once | INTRAVENOUS | Status: AC | PRN
  Administered 2015-07-18: 10.8 via INTRAVENOUS

## 2015-07-20 LAB — MYOCARDIAL PERFUSION IMAGING
CHL CUP NUCLEAR SDS: 1
LV dias vol: 158 mL
LVSYSVOL: 64 mL
Peak HR: 58 {beats}/min
Rest HR: 50 {beats}/min
SRS: 2
SSS: 3
TID: 1.11

## 2015-07-29 ENCOUNTER — Telehealth: Payer: Self-pay | Admitting: Physician Assistant

## 2015-07-29 ENCOUNTER — Encounter: Payer: Self-pay | Admitting: Physician Assistant

## 2015-07-30 DIAGNOSIS — H43812 Vitreous degeneration, left eye: Secondary | ICD-10-CM | POA: Diagnosis not present

## 2015-07-30 NOTE — Telephone Encounter (Signed)
Close encounter 

## 2015-08-05 ENCOUNTER — Ambulatory Visit: Payer: Commercial Managed Care - HMO | Admitting: Physician Assistant

## 2015-08-08 DIAGNOSIS — E785 Hyperlipidemia, unspecified: Secondary | ICD-10-CM | POA: Diagnosis not present

## 2015-08-08 DIAGNOSIS — M109 Gout, unspecified: Secondary | ICD-10-CM | POA: Diagnosis not present

## 2015-08-08 DIAGNOSIS — I10 Essential (primary) hypertension: Secondary | ICD-10-CM | POA: Diagnosis not present

## 2015-08-08 DIAGNOSIS — N400 Enlarged prostate without lower urinary tract symptoms: Secondary | ICD-10-CM | POA: Diagnosis not present

## 2015-08-08 DIAGNOSIS — Z125 Encounter for screening for malignant neoplasm of prostate: Secondary | ICD-10-CM | POA: Diagnosis not present

## 2015-08-09 ENCOUNTER — Encounter: Payer: Self-pay | Admitting: Physician Assistant

## 2015-08-09 ENCOUNTER — Ambulatory Visit (INDEPENDENT_AMBULATORY_CARE_PROVIDER_SITE_OTHER): Payer: Commercial Managed Care - HMO | Admitting: Physician Assistant

## 2015-08-09 VITALS — BP 130/68 | HR 55 | Ht 71.5 in | Wt 196.1 lb

## 2015-08-09 DIAGNOSIS — Z7901 Long term (current) use of anticoagulants: Secondary | ICD-10-CM

## 2015-08-09 DIAGNOSIS — I480 Paroxysmal atrial fibrillation: Secondary | ICD-10-CM | POA: Diagnosis not present

## 2015-08-09 DIAGNOSIS — E785 Hyperlipidemia, unspecified: Secondary | ICD-10-CM | POA: Diagnosis not present

## 2015-08-09 DIAGNOSIS — I959 Hypotension, unspecified: Secondary | ICD-10-CM | POA: Diagnosis not present

## 2015-08-09 MED ORDER — RIVAROXABAN 20 MG PO TABS: 20.0000 mg | ORAL_TABLET | Freq: Every day | ORAL | Status: DC

## 2015-08-09 NOTE — Patient Instructions (Signed)
Your physician recommends that you keep a follow-up appointment in November with Dr. [name].

## 2015-08-09 NOTE — Progress Notes (Signed)
Patient ID: Stanley Waters, male   DOB: [birth year], 80 y.o.   MRN: [id number]    Date:  08/09/2015   ID:  Stanley Waters, DOB 1935-05-14, MRN [id number]  PCP:  [name], MD  Primary Cardiologist:  Wyn Forster chief complaint on file.    History of Present Illness: Stanley Waters is a 80 y.o. male who presents for follow up for PAF. He has a history of normal coronary arteries by cath in 2002. He has PAF and has undergone a transesophageal echo, most recently by Dr. [name] July 07, 2012. His other problems include hypertension and hyperlipidemia. His medications were adjusted because he was bradycardic when he saw Dr. [name] and changed his Eliquis to Xarelto at his request because of cost. He apparently has developed a rash since that time which he attributes to the Xarelto but he continues xarelto and no rash now. He was admitted to the hospital on 06/29/13 with A. Fib with RVR. Ultimately underwent bedside DC cardioversion by Dr. [name] successfully to sinus rhythm after being switched from Rythmol to amiodarone. Since thatdischarge he had multiple episodes of PAF responsive to when necessary supplemental metoprolol. He wore an event monitor for 2 weeks but did not show episodes of bradycardia. He remains on amiodarone He did stop his metoprolol because of hypotension.  The patient is here for follow-up of dizziness and bradycardia. He says he has not had any dizziness in the last 3 weeks. His blood pressure is much better off of the metoprolol.  Had a low risk nuclear stress test on 07/18/2015. Ejection fraction was noted 60%.  He otherwise denies nausea, vomiting, fever, chest pain, shortness of breath, orthopnea, dizziness, PND, cough, congestion, abdominal pain, hematochezia, melena, lower extremity edema, claudication.  Wt Readings from Last 3 Encounters:  08/09/15 88.95 kg (196 lb 1.6 oz)  07/18/15 88.451 kg (195 lb)  06/28/15 88.86 kg (195 lb 14.4 oz)     Past  Medical History  Diagnosis Date  . Hypertension   . Normal coronary arteries 2002  . Hyperlipemia   . Seasonal allergies   . Enlarged prostate     BPH - PSA of 6.7 this is consistant with his last PSA of 6.3  . Peripheral neuropathy   . Intestinal polyps 09/22/11    Colonoscopy removed a 2 mm sessile cecal polyp with a cold snare.  . Tinnitus   . Measles   . Mumps   . Pneumonia   . Inguinal hernia     Inguinal hernia repair (x) 2 1991, 2011  . Umbilical hernia   . Vitamin D deficiency   . Thrombocytopenia 2011    Very mild with platelets of 135,000.  [name] PAF (paroxysmal atrial fibrillation)     Transesophageal echo on 07/07/12 - EF =50-55%. Mild atheromatous disease of the proximal descending aorta. Cadioversion 07/07/12 successful.  . Atypical chest pain     Myoview performed 07/23/11 was completely normal. Post stress EF 61%.  . Bruit     Left asymptomatic bruit. Carotid Duplex 12/13/12 =mildly abnormal. *BILATERAL BULB/PROXIMAL ICAs: Demonstrated a mild amount of fibrous plaque w/no evidence od significant diameter reduction, tortuosity or other vascular abnormality.  . Skin cancer     Current Outpatient Prescriptions  Medication Sig Dispense Refill  . amiodarone (PACERONE) 200 MG tablet Take 1 tablet (200 mg total) by mouth daily. 90 tablet 3  . Cholecalciferol (VITAMIN D3) 50000 UNITS CAPS Take 1 capsule by mouth once a week.    [name]  doxazosin (CARDURA) 8 MG tablet Take 1 tablet (8 mg total) by mouth at bedtime. 90 tablet 3  . furosemide (LASIX) 20 MG tablet Take 20 mg by mouth as needed for fluid.    [name] lisinopril (PRINIVIL,ZESTRIL) 2.5 MG tablet Take 2.5 mg by mouth daily as needed (blood pressure).     . OMEGA-3 KRILL OIL PO Take 1 capsule by mouth daily.    . psyllium (METAMUCIL) 58.6 % powder Take 1 packet by mouth as needed (constipation).     . rivaroxaban (XARELTO) 20 MG TABS tablet Take 1 tablet (20 mg total) by mouth daily with supper. 90 tablet 3  . simvastatin (ZOCOR) 5  MG tablet Take 5 mg by mouth at bedtime.    . triamcinolone cream (KENALOG) 0.1 % Apply 1 application topically 2 (two) times daily as needed (SKIN RASHES).      No current facility-administered medications for this visit.    Allergies:    Allergies  Allergen Reactions  . Prednisone Swelling, Other (See Comments) and Hypertension    Tachycardia  . Doxycycline Rash    Social History:  The patient  reports that he quit smoking about 51 years ago. His smoking use included Cigarettes. He has never used smokeless tobacco. He reports that he does not drink alcohol or use illicit drugs.   Family history:   Family History  Problem Relation Age of Onset  . Heart failure Brother     ROS:  Please see the history of present illness.  All other systems reviewed and negative.   PHYSICAL EXAM: VS:  BP 130/68 mmHg  Pulse 55  Ht 5' 11.5" (1.816 m)  Wt 88.95 kg (196 lb 1.6 oz)  BMI 26.97 kg/m2  SpO2 98% Well nourished, well developed, in no acute distress HEENT: Pupils are equal round react to light accommodation extraocular movements are intact.  Neck: no JVDNo cervical lymphadenopathy. Cardiac: Regular rate and rhythm without murmurs rubs or gallops. Lungs:  clear to auscultation bilaterally, no wheezing, rhonchi or rales Ext: no lower extremity edema.  2+ radial and dorsalis pedis pulses. Skin: warm and dry Neuro:  Grossly normal    ASSESSMENT AND PLAN:  Problem List Items Addressed This Visit    Paroxysmal atrial fibrillation (HCC) - Primary   Relevant Medications   rivaroxaban (XARELTO) 20 MG TABS tablet   Hypotension- transient, related to tachycardia   Relevant Medications   rivaroxaban (XARELTO) 20 MG TABS tablet   Hyperlipidemia   Relevant Medications   rivaroxaban (XARELTO) 20 MG TABS tablet   Chronic anticoagulation -Xarelto     Dizziness:  Resolved  Bradycardia:  Patient's heart rate is normally in the upper 40s to 50s. He was previously offered to wear a heart  monitor however, he declined.  He has not complained of any palpitations or racing heart.  Paroxysmal atrial fibrillation: Continue Xarelto. Rhythm regular on exam  Hypotension: This seems to have resolved. Blood pressure 130/86 today no changes to current medications.  Follow-up with Dr. [name] as scheduled

## 2015-08-15 DIAGNOSIS — N182 Chronic kidney disease, stage 2 (mild): Secondary | ICD-10-CM | POA: Diagnosis not present

## 2015-08-15 DIAGNOSIS — F17211 Nicotine dependence, cigarettes, in remission: Secondary | ICD-10-CM | POA: Diagnosis not present

## 2015-08-15 DIAGNOSIS — I129 Hypertensive chronic kidney disease with stage 1 through stage 4 chronic kidney disease, or unspecified chronic kidney disease: Secondary | ICD-10-CM | POA: Diagnosis not present

## 2015-08-15 DIAGNOSIS — D709 Neutropenia, unspecified: Secondary | ICD-10-CM | POA: Diagnosis not present

## 2015-08-15 DIAGNOSIS — I480 Paroxysmal atrial fibrillation: Secondary | ICD-10-CM | POA: Diagnosis not present

## 2015-08-15 DIAGNOSIS — I509 Heart failure, unspecified: Secondary | ICD-10-CM | POA: Diagnosis not present

## 2015-08-15 DIAGNOSIS — Z23 Encounter for immunization: Secondary | ICD-10-CM | POA: Diagnosis not present

## 2015-08-15 DIAGNOSIS — I2510 Atherosclerotic heart disease of native coronary artery without angina pectoris: Secondary | ICD-10-CM | POA: Diagnosis not present

## 2015-10-01 ENCOUNTER — Encounter: Payer: Self-pay | Admitting: Cardiovascular Disease

## 2015-10-01 ENCOUNTER — Ambulatory Visit (INDEPENDENT_AMBULATORY_CARE_PROVIDER_SITE_OTHER): Payer: Commercial Managed Care - HMO | Admitting: Cardiovascular Disease

## 2015-10-01 VITALS — BP 144/68 | HR 56 | Ht 71.0 in | Wt 198.0 lb

## 2015-10-01 DIAGNOSIS — I480 Paroxysmal atrial fibrillation: Secondary | ICD-10-CM

## 2015-10-01 DIAGNOSIS — E785 Hyperlipidemia, unspecified: Secondary | ICD-10-CM

## 2015-10-01 NOTE — Assessment & Plan Note (Signed)
History of paroxysmal atrial fibrillation maintaining sinus rhythm on amiodarone and consider L toe. He has undergone cardioversion by Dr. [name] in the past as well as Dr. Debara Pickett. He gets dizzy when he becomes bradycardic. His metoprolol has been stopped. He may require permanent transvenous pacing. I'm going to refer him back to Dr. [name] for evaluation of this

## 2015-10-01 NOTE — Assessment & Plan Note (Signed)
History of hyperlipidemia on simvastatin followed by his PCP 

## 2015-10-01 NOTE — Progress Notes (Signed)
10/01/2015 Stanley Waters   [birth year]  [id number]  Primary Physician [name], MD Primary Cardiologist: Lorretta Harp MD Renae Gloss    HPI:  The patient is a very pleasant 80 year old mildly overweight widowed Caucasian male father of 39, grandfather of 2 grandchildren who I last saw 01/02/15.Stanley Waters He has a history of normal coronary arteries by cath in 2002. He has PAF and has undergone a transesophageal echo, most recently by Dr. [name] July 07, 2012. His other problems include hypertension and hyperlipidemia. I adjusted his medications because he was bradycardic when I last saw him and changed his Eliquis to Xarelto at his request because of cost. He apparently has developed a rash since that time which he attributes to the Xarelto. He was admitted to the hospital on 06/29/13 with A. Fib with RVR. Ultimately underwent bedside DC cardioversion by Dr. [name] successfully to sinus rhythm after being switched from Rythmol to amiodarone. Since discharge he had multiple episodes of right true PAF responsive to when necessary supplemental metoprolol. He wore an event monitor for 2 weeks but did show episodes of bradycardia.since I saw him last in September he has had no episodes of atrial fibrillation.  His metoprolol was discontinued as was his verapamil. He has had heart rates in the 40s and is symptomatic from this with dizziness. He had a stress test performed back in September which was low risk.  Current Outpatient Prescriptions  Medication Sig Dispense Refill  . amiodarone (PACERONE) 200 MG tablet Take 1 tablet (200 mg total) by mouth daily. 90 tablet 3  . Cholecalciferol (VITAMIN D3) 50000 UNITS CAPS Take 1 capsule by mouth once a week.    . doxazosin (CARDURA) 8 MG tablet Take 1 tablet (8 mg total) by mouth at bedtime. 90 tablet 3  . furosemide (LASIX) 20 MG tablet Take 20 mg by mouth as needed for fluid.    Stanley Waters lisinopril (PRINIVIL,ZESTRIL) 2.5 MG tablet Take 2.5  mg by mouth daily as needed (blood pressure).     . OMEGA-3 KRILL OIL PO Take 1 capsule by mouth daily.    . psyllium (METAMUCIL) 58.6 % powder Take 1 packet by mouth as needed (constipation).     . rivaroxaban (XARELTO) 20 MG TABS tablet Take 1 tablet (20 mg total) by mouth daily with supper. 90 tablet 3  . simvastatin (ZOCOR) 5 MG tablet Take 5 mg by mouth at bedtime.    . triamcinolone cream (KENALOG) 0.1 % Apply 1 application topically 2 (two) times daily as needed (SKIN RASHES).      No current facility-administered medications for this visit.    Allergies  Allergen Reactions  . Prednisone Swelling, Other (See Comments) and Hypertension    Tachycardia  . Doxycycline Rash    Social History   Social History  . Marital Status: Widowed    Spouse Name: N/A  . Number of Children: 2  . Years of Education: N/A   Occupational History  .      Road Architect   Social History Main Topics  . Smoking status: Former Smoker    Types: Cigarettes    Quit date: 11/03/1963  . Smokeless tobacco: Never Used  . Alcohol Use: No  . Drug Use: No  . Sexual Activity: Not on file   Other Topics Concern  . Not on file   Social History Narrative     Review of Systems: General: negative for chills, fever, night sweats or weight changes.  Cardiovascular: negative  for chest pain, dyspnea on exertion, edema, orthopnea, palpitations, paroxysmal nocturnal dyspnea or shortness of breath Dermatological: negative for rash Respiratory: negative for cough or wheezing Urologic: negative for hematuria Abdominal: negative for nausea, vomiting, diarrhea, bright red blood per rectum, melena, or hematemesis Neurologic: negative for visual changes, syncope, or dizziness All other systems reviewed and are otherwise negative except as noted above.    Blood pressure 144/68, pulse 56, height 5\' 11"  (1.803 m), weight 198 lb (89.812 kg).  General appearance: alert and no distress Neck: no adenopathy, no  carotid bruit, no JVD, supple, symmetrical, trachea midline and thyroid not enlarged, symmetric, no tenderness/mass/nodules Lungs: clear to auscultation bilaterally Heart: regular rate and rhythm, S1, S2 normal, no murmur, click, rub or gallop Extremities: extremities normal, atraumatic, no cyanosis or edema  EKG not performed today  ASSESSMENT AND PLAN:   Paroxysmal atrial fibrillation History of paroxysmal atrial fibrillation maintaining sinus rhythm on amiodarone and consider L toe. He has undergone cardioversion by Dr. [name] in the past as well as Dr. Debara Pickett. He gets dizzy when he becomes bradycardic. His metoprolol has been stopped. He may require permanent transvenous pacing. I'm going to refer him back to Dr. [name] for evaluation of this  Hyperlipidemia History of hyperlipidemia on simvastatin followed by his PCP      Lorretta Harp MD [facility], [facility] 10/01/2015 12:00 PM

## 2015-10-01 NOTE — Patient Instructions (Signed)
Medication Instructions:  Your physician recommends that you continue on your current medications as directed. Please refer to the Current Medication list given to you today.   Labwork: none  Testing/Procedures: none  Follow-Up: You have been referred to Dr. [name] - to discuss Pacemaker Placement   We request that you follow-up in: 6 months with an Dorene Ar or [place] and in 12 months with Dr Andria Rhein will receive a reminder letter in the mail two months in advance. If you don't receive a letter, please call our office to schedule the follow-up appointment.    Any Other Special Instructions Will Be Listed Below (If Applicable).     If you need a refill on your cardiac medications before your next appointment, please call your pharmacy.

## 2015-11-21 ENCOUNTER — Ambulatory Visit: Payer: Commercial Managed Care - HMO | Admitting: Cardiovascular Disease

## 2016-01-20 DIAGNOSIS — R972 Elevated prostate specific antigen [PSA]: Secondary | ICD-10-CM | POA: Diagnosis not present

## 2016-01-24 DIAGNOSIS — R972 Elevated prostate specific antigen [PSA]: Secondary | ICD-10-CM | POA: Diagnosis not present

## 2016-01-24 DIAGNOSIS — N138 Other obstructive and reflux uropathy: Secondary | ICD-10-CM | POA: Diagnosis not present

## 2016-01-24 DIAGNOSIS — Z0000 Encounter for general adult medical examination without abnormal findings: Secondary | ICD-10-CM | POA: Diagnosis not present

## 2016-01-24 DIAGNOSIS — R351 Nocturia: Secondary | ICD-10-CM | POA: Diagnosis not present

## 2016-01-24 DIAGNOSIS — N401 Enlarged prostate with lower urinary tract symptoms: Secondary | ICD-10-CM | POA: Diagnosis not present

## 2016-02-05 DIAGNOSIS — Z0000 Encounter for general adult medical examination without abnormal findings: Secondary | ICD-10-CM | POA: Diagnosis not present

## 2016-02-05 DIAGNOSIS — Z1389 Encounter for screening for other disorder: Secondary | ICD-10-CM | POA: Diagnosis not present

## 2016-02-05 DIAGNOSIS — E785 Hyperlipidemia, unspecified: Secondary | ICD-10-CM | POA: Diagnosis not present

## 2016-02-05 DIAGNOSIS — M109 Gout, unspecified: Secondary | ICD-10-CM | POA: Diagnosis not present

## 2016-02-05 DIAGNOSIS — E559 Vitamin D deficiency, unspecified: Secondary | ICD-10-CM | POA: Diagnosis not present

## 2016-02-05 DIAGNOSIS — I129 Hypertensive chronic kidney disease with stage 1 through stage 4 chronic kidney disease, or unspecified chronic kidney disease: Secondary | ICD-10-CM | POA: Diagnosis not present

## 2016-02-05 DIAGNOSIS — M8580 Other specified disorders of bone density and structure, unspecified site: Secondary | ICD-10-CM | POA: Diagnosis not present

## 2016-02-05 DIAGNOSIS — Z125 Encounter for screening for malignant neoplasm of prostate: Secondary | ICD-10-CM | POA: Diagnosis not present

## 2016-02-12 DIAGNOSIS — I2510 Atherosclerotic heart disease of native coronary artery without angina pectoris: Secondary | ICD-10-CM | POA: Diagnosis not present

## 2016-02-12 DIAGNOSIS — D709 Neutropenia, unspecified: Secondary | ICD-10-CM | POA: Diagnosis not present

## 2016-02-12 DIAGNOSIS — I4891 Unspecified atrial fibrillation: Secondary | ICD-10-CM | POA: Diagnosis not present

## 2016-02-12 DIAGNOSIS — I129 Hypertensive chronic kidney disease with stage 1 through stage 4 chronic kidney disease, or unspecified chronic kidney disease: Secondary | ICD-10-CM | POA: Diagnosis not present

## 2016-02-12 DIAGNOSIS — F17211 Nicotine dependence, cigarettes, in remission: Secondary | ICD-10-CM | POA: Diagnosis not present

## 2016-02-13 ENCOUNTER — Other Ambulatory Visit: Payer: Self-pay | Admitting: Internal Medicine

## 2016-02-13 DIAGNOSIS — N644 Mastodynia: Secondary | ICD-10-CM

## 2016-02-19 ENCOUNTER — Ambulatory Visit
Admission: RE | Admit: 2016-02-19 | Discharge: 2016-02-19 | Disposition: A | Discharge status: Home / Self Care | Payer: Commercial Managed Care - HMO | Source: Ambulatory Visit | Attending: Internal Medicine | Admitting: Internal Medicine

## 2016-02-19 DIAGNOSIS — N644 Mastodynia: Secondary | ICD-10-CM

## 2016-02-19 DIAGNOSIS — N62 Hypertrophy of breast: Secondary | ICD-10-CM | POA: Diagnosis not present

## 2016-03-26 ENCOUNTER — Ambulatory Visit (INDEPENDENT_AMBULATORY_CARE_PROVIDER_SITE_OTHER): Payer: Commercial Managed Care - HMO | Admitting: Physician Assistant

## 2016-03-26 ENCOUNTER — Encounter: Payer: Self-pay | Admitting: Physician Assistant

## 2016-03-26 VITALS — BP 160/74 | HR 51 | Ht 71.0 in | Wt 191.0 lb

## 2016-03-26 DIAGNOSIS — E785 Hyperlipidemia, unspecified: Secondary | ICD-10-CM

## 2016-03-26 DIAGNOSIS — I959 Hypotension, unspecified: Secondary | ICD-10-CM | POA: Diagnosis not present

## 2016-03-26 DIAGNOSIS — Z7901 Long term (current) use of anticoagulants: Secondary | ICD-10-CM | POA: Diagnosis not present

## 2016-03-26 DIAGNOSIS — I10 Essential (primary) hypertension: Secondary | ICD-10-CM | POA: Insufficient documentation

## 2016-03-26 DIAGNOSIS — I480 Paroxysmal atrial fibrillation: Secondary | ICD-10-CM | POA: Diagnosis not present

## 2016-03-26 NOTE — Patient Instructions (Signed)
Monitor blood pressure at home If BP consistently greater than 140 (top number) - please call our office  Your physician wants you to follow-up in: 6 months with Dr. [name]. You will receive a reminder letter in the mail two months in advance. If you don't receive a letter, please call our office to schedule the follow-up appointment.

## 2016-03-26 NOTE — Progress Notes (Signed)
Patient ID: Stanley Waters, male   DOB: [birth year], 81 y.o.   MRN: [id number]    Date:  03/26/2016   ID:  Stanley Waters, DOB Apr 24, 1935, MRN [id number]  PCP:  [name], MD  Primary Cardiologist:  [name]  Chief Complaint  Patient presents with  . Follow-up    some shortness of breath, some swelling occ. some dizziness & lightheadedness     History of Present Illness: Stanley Waters is a 81 y.o. male mildly overweight widowed Caucasian male father of 65, grandfather of 2 grandchildren who I last saw 01/02/15.[name] He has a history of normal coronary arteries by cath in 2002. He has PAF and has undergone a transesophageal echo, most recently by Dr. [name] July 07, 2012. His other problems include hypertension and hyperlipidemia. Dr. [name] adjusted his medications because he was bradycardic.   Eliquis change to Xarelto at his request because of cost. He apparently has developed a rash since that time which he attributes to the Xarelto. He was admitted to the hospital on 06/29/13 with A. Fib with RVR. Ultimately underwent bedside DC cardioversion by Dr. [name] successfully to sinus rhythm after being switched from Rythmol to amiodarone. Since discharge he had multiple episodes of right true PAF responsive to when necessary supplemental metoprolol. He wore an event monitor for 2 weeks but did show episodes of bradycardia.  Patient presents for six-month evaluation. He reports doing well but does have some periodic dizziness and dyspnea. He was complaining of some dizziness while he was sitting in the office and his heart rate at that time was 51 bpm.  In sinus rhythm.  No vertigo.  He also reports easily bruising which is a result of the Xarelto.  He has not needed any Lasix in the last month. His blood pressure at home usually ranges 86/48-142 over something.    The patient currently denies nausea, vomiting, fever, chest pain, orthopnea, PND, cough, congestion, abdominal pain, hematochezia,  melena, lower extremity edema, claudication.  Wt Readings from Last 3 Encounters:  03/26/16 191 lb (86.637 kg)  10/01/15 198 lb (89.812 kg)  08/09/15 196 lb 1.6 oz (88.95 kg)     Past Medical History  Diagnosis Date  . Hypertension   . Normal coronary arteries 2002  . Hyperlipemia   . Seasonal allergies   . Enlarged prostate     BPH - PSA of 6.7 this is consistant with his last PSA of 6.3  . Peripheral neuropathy ([place])   . Intestinal polyps 09/22/11    Colonoscopy removed a 2 mm sessile cecal polyp with a cold snare.  . Tinnitus   . Measles   . Mumps   . Pneumonia   . Inguinal hernia     Inguinal hernia repair (x) 2 1991, 2011  . Umbilical hernia   . Vitamin D deficiency   . Thrombocytopenia ([place]) 2011    Very mild with platelets of 135,000.  [name] PAF (paroxysmal atrial fibrillation) (HCC)     Transesophageal echo on 07/07/12 - EF =50-55%. Mild atheromatous disease of the proximal descending aorta. Cadioversion 07/07/12 successful.  . Atypical chest pain     Myoview performed 07/23/11 was completely normal. Post stress EF 61%.  . Bruit     Left asymptomatic bruit. Carotid Duplex 12/13/12 =mildly abnormal. *BILATERAL BULB/PROXIMAL ICAs: Demonstrated a mild amount of fibrous plaque w/no evidence od significant diameter reduction, tortuosity or other vascular abnormality.  . Skin cancer     Current Outpatient Prescriptions  Medication Sig  Dispense Refill  . amiodarone (PACERONE) 200 MG tablet Take 1 tablet (200 mg total) by mouth daily. 90 tablet 3  . Cholecalciferol (VITAMIN D3) 50000 UNITS CAPS Take 1 capsule by mouth once a week.    . doxazosin (CARDURA) 8 MG tablet Take 1 tablet (8 mg total) by mouth at bedtime. 90 tablet 3  . furosemide (LASIX) 20 MG tablet Take 20 mg by mouth as needed for fluid.    [name] lisinopril (PRINIVIL,ZESTRIL) 2.5 MG tablet Take 2.5 mg by mouth daily as needed (blood pressure).     . OMEGA-3 KRILL OIL PO Take 1 capsule by mouth daily.    . psyllium  (METAMUCIL) 58.6 % powder Take 1 packet by mouth as needed (constipation).     . rivaroxaban (XARELTO) 20 MG TABS tablet Take 1 tablet (20 mg total) by mouth daily with supper. 90 tablet 3  . simvastatin (ZOCOR) 5 MG tablet Take 5 mg by mouth at bedtime.    . triamcinolone cream (KENALOG) 0.1 % Apply 1 application topically 2 (two) times daily as needed (SKIN RASHES).      No current facility-administered medications for this visit.    Allergies:    Allergies  Allergen Reactions  . Prednisone Swelling, Other (See Comments) and Hypertension    Tachycardia  . Doxycycline Rash    Social History:  The patient  reports that he quit smoking about 52 years ago. His smoking use included Cigarettes. He has never used smokeless tobacco. He reports that he does not drink alcohol or use illicit drugs.   Family history:   Family History  Problem Relation Age of Onset  . Heart failure Brother     ROS:  Please see the history of present illness.  All other systems reviewed and negative.   PHYSICAL EXAM: VS:  BP 160/74 mmHg  Pulse 51  Ht 5\' 11"  (1.803 m)  Wt 191 lb (86.637 kg)  BMI 26.65 kg/m2 Well nourished, well developed, in no acute distress HEENT: Pupils are equal round react to light accommodation extraocular movements are intact.  Neck: no JVDNo cervical lymphadenopathy. Cardiac: Regular rate and rhythm without murmurs rubs or gallops. Lungs:  clear to auscultation bilaterally, no wheezing, rhonchi or rales Abd: soft, nontender, positive bowel sounds all quadrants, no hepatosplenomegaly Ext: no lower extremity edema.  2+ radial and dorsalis pedis pulses. Skin: warm and dry Neuro:  Grossly normal  EKG:  His bradycardia with a first-degree AV block QTC of 484 ms    ASSESSMENT AND PLAN:  Problem List Items Addressed This Visit    Paroxysmal atrial fibrillation ([place]) - Primary   RESOLVED: Hypotension- transient, related to tachycardia   Hyperlipidemia   Essential hypertension    Chronic anticoagulation -Xarelto     Mr. Barrientes appears to be doing well. Cannot explain the dizziness that he's feeling in the office. He denies any vertigo. His heart rate is 51 bpm and his sinus rhythm. Blood pressure is elevated at 160/74 however it usually runs low to about 0000000 systolic. He will continue to monitor at home and if consistently elevated he'll give [place] a call back otherwise no changes in current medications. He is on amiodarone for his atrial fibrillation as well as Xarelto. We have provided him with some samples.  He is also on Zocor for his cholesterol. He has not needed any Lasix in the last month.  Follow-up in 6 months.

## 2016-04-10 DIAGNOSIS — L718 Other rosacea: Secondary | ICD-10-CM | POA: Diagnosis not present

## 2016-04-10 DIAGNOSIS — H02139 Senile ectropion of unspecified eye, unspecified eyelid: Secondary | ICD-10-CM | POA: Diagnosis not present

## 2016-04-10 DIAGNOSIS — H01003 Unspecified blepharitis right eye, unspecified eyelid: Secondary | ICD-10-CM | POA: Diagnosis not present

## 2016-04-10 DIAGNOSIS — H11153 Pinguecula, bilateral: Secondary | ICD-10-CM | POA: Diagnosis not present

## 2016-04-29 DIAGNOSIS — N62 Hypertrophy of breast: Secondary | ICD-10-CM | POA: Diagnosis not present

## 2016-05-11 ENCOUNTER — Other Ambulatory Visit: Payer: Self-pay | Admitting: Cardiology

## 2016-05-11 NOTE — Telephone Encounter (Signed)
REFILL 

## 2016-07-07 ENCOUNTER — Ambulatory Visit
Admission: RE | Admit: 2016-07-07 | Discharge: 2016-07-07 | Disposition: A | Discharge status: Home / Self Care | Payer: Commercial Managed Care - HMO | Source: Ambulatory Visit | Attending: Internal Medicine | Admitting: Internal Medicine

## 2016-07-07 ENCOUNTER — Other Ambulatory Visit: Payer: Self-pay | Admitting: Internal Medicine

## 2016-07-07 DIAGNOSIS — R609 Edema, unspecified: Secondary | ICD-10-CM

## 2016-07-07 DIAGNOSIS — R52 Pain, unspecified: Secondary | ICD-10-CM

## 2016-07-07 DIAGNOSIS — Z23 Encounter for immunization: Secondary | ICD-10-CM | POA: Diagnosis not present

## 2016-07-07 DIAGNOSIS — M79662 Pain in left lower leg: Secondary | ICD-10-CM | POA: Diagnosis not present

## 2016-07-07 DIAGNOSIS — M79605 Pain in left leg: Secondary | ICD-10-CM | POA: Diagnosis not present

## 2016-07-09 DIAGNOSIS — M25569 Pain in unspecified knee: Secondary | ICD-10-CM | POA: Diagnosis not present

## 2016-07-10 DIAGNOSIS — M25562 Pain in left knee: Secondary | ICD-10-CM | POA: Diagnosis not present

## 2016-08-06 DIAGNOSIS — E559 Vitamin D deficiency, unspecified: Secondary | ICD-10-CM | POA: Diagnosis not present

## 2016-08-06 DIAGNOSIS — Z1159 Encounter for screening for other viral diseases: Secondary | ICD-10-CM | POA: Diagnosis not present

## 2016-08-06 DIAGNOSIS — I129 Hypertensive chronic kidney disease with stage 1 through stage 4 chronic kidney disease, or unspecified chronic kidney disease: Secondary | ICD-10-CM | POA: Diagnosis not present

## 2016-08-06 DIAGNOSIS — D696 Thrombocytopenia, unspecified: Secondary | ICD-10-CM | POA: Diagnosis not present

## 2016-08-06 DIAGNOSIS — E785 Hyperlipidemia, unspecified: Secondary | ICD-10-CM | POA: Diagnosis not present

## 2016-08-06 DIAGNOSIS — M8580 Other specified disorders of bone density and structure, unspecified site: Secondary | ICD-10-CM | POA: Diagnosis not present

## 2016-08-06 DIAGNOSIS — M109 Gout, unspecified: Secondary | ICD-10-CM | POA: Diagnosis not present

## 2016-08-13 DIAGNOSIS — I10 Essential (primary) hypertension: Secondary | ICD-10-CM | POA: Diagnosis not present

## 2016-08-13 DIAGNOSIS — D696 Thrombocytopenia, unspecified: Secondary | ICD-10-CM | POA: Diagnosis not present

## 2016-08-13 DIAGNOSIS — D709 Neutropenia, unspecified: Secondary | ICD-10-CM | POA: Diagnosis not present

## 2016-08-13 DIAGNOSIS — I4891 Unspecified atrial fibrillation: Secondary | ICD-10-CM | POA: Diagnosis not present

## 2016-08-13 DIAGNOSIS — F17211 Nicotine dependence, cigarettes, in remission: Secondary | ICD-10-CM | POA: Diagnosis not present

## 2016-08-14 ENCOUNTER — Encounter: Payer: Self-pay | Admitting: Physician Assistant

## 2016-08-14 ENCOUNTER — Encounter (INDEPENDENT_AMBULATORY_CARE_PROVIDER_SITE_OTHER): Payer: Commercial Managed Care - HMO

## 2016-08-14 ENCOUNTER — Ambulatory Visit (INDEPENDENT_AMBULATORY_CARE_PROVIDER_SITE_OTHER): Payer: Commercial Managed Care - HMO | Admitting: Physician Assistant

## 2016-08-14 VITALS — BP 170/64 | HR 61 | Ht 71.0 in | Wt 198.2 lb

## 2016-08-14 DIAGNOSIS — R42 Dizziness and giddiness: Secondary | ICD-10-CM | POA: Diagnosis not present

## 2016-08-14 DIAGNOSIS — I10 Essential (primary) hypertension: Secondary | ICD-10-CM

## 2016-08-14 DIAGNOSIS — R001 Bradycardia, unspecified: Secondary | ICD-10-CM

## 2016-08-14 DIAGNOSIS — R0789 Other chest pain: Secondary | ICD-10-CM

## 2016-08-14 DIAGNOSIS — R0989 Other specified symptoms and signs involving the circulatory and respiratory systems: Secondary | ICD-10-CM

## 2016-08-14 DIAGNOSIS — E785 Hyperlipidemia, unspecified: Secondary | ICD-10-CM

## 2016-08-14 DIAGNOSIS — I480 Paroxysmal atrial fibrillation: Secondary | ICD-10-CM

## 2016-08-14 MED ORDER — HYDRALAZINE HCL 10 MG PO TABS: 10.0000 mg | ORAL_TABLET | ORAL | 3 refills | Status: DC | PRN

## 2016-08-14 NOTE — Progress Notes (Signed)
Cardiology Office Note    Date:  08/14/2016   ID:  Stanley Waters, DOB 1935-05-17, MRN [id number]  PCP:  [name], MD  Cardiologist:  Dr. [name]  Chief Complaint  Patient presents with  . Follow-up    seen for Dr. [name], pt c/o dizziness and erratic blood pressure     History of Present Illness:  Stanley Waters is a 81 y.o. male with PMH of hypertension, hyperlipidemia, history of PAF and carotid artery disease. He had a normal coronaries by cath in 2002. He used to be on eliquis that was changed to Xarelto at his request because of cost, however he developed a rash after starting on Xarelto. He was admitted to the hospital in August 2014 with atrial fibrillation with RVR. He ultimately underwent bedside DC cardioversion by Dr. [name] with successful conversion to sinus rhythm after switched from Rythmol to amiodarone. He still has episodes of PAF responsive to PRN metoprolol.   He was last seen in the office on 03/26/2016 by Tarri Fuller PA-C, he was doing well at the time with periodic dizziness and dyspnea. His metoprolol was stopped due to episode of bradycardia. Based on Dr. Naida Sleight note on 10/01/2015, the plan was to refer him to Dr. Constance Holster for consideration of permanent transvenous venous pacing for bradycardia, however to follow-up did not happen.  He presents today for evaluation of labile blood pressure at the advise of his PCP. He is actually scheduled to see Dr. [name] next week however his PCP asked him to be evaluated sooner. Lab work received from [facility], [place] obtained on 08/06/2016 showed white blood cell 3.8, hemoglobin 14.8, platelet 112, glucose 87, creatinine 1.2 unchanged from 1.3 previously, sodium 141, potassium 4.1, chloride 104, normal AST and ALT. Cholesterol 135, triglycerides 70, HDL 61, LDL 60. TSH normal. Uric acid 5.0 normal. He did bring his electronic blood pressure cuff, his blood pressure has been ranging from the 80s up to the  180s. Looking back at his record, it appears his blood pressure has always been labile as documented during his previous office visit. He says he does have some dizziness, and not all of them are related to low blood pressure. He also continued to have bradycardia problem. He was referred to Dr. [name] in January for consideration of pacemaker, however he decided to cancel the visit. He is very concerned that sometimes his heart rate goes down to as low as 40 bpm which he thinks contribute to part of his dizziness. Interestingly, sometimes he is also dizzy when his blood pressure drops. The blood pressure drop does not seems to have a direct correlation with bradycardia as based on his electronic blood pressure cuff, the two actually occurs separately. He is on minimal dose of lisinopril, however his blood pressure today is [id number] systolic. He is also on amiodarone for paroxysmal atrial fibrillation, we did discuss potentially cut the amiodarone back to 100 mg daily, however there is a possibility that his atrial fibrillation will recur since he is not on any rate control medication. As for his labile blood pressure, I did prescribe him 10 mg hydralazine to use as needed for systolic blood pressure greater than 160. I am hesitant to increase his lisinopril which may result in episodic hypotension. I will refer him to Dr. [name] again for evaluation of bradycardia, during the mean time, he will wear a 2 weeks event monitor to check for heart rate and recurrence of atrial fibrillation. I will push back  his follow-up with Dr. [name] next week until 2 months later. He also mentions a mild left-sided chest soreness that has been going on for the past several weeks. It is worse with rotation of the left arm. And it does not seems to go away. Given persistence of the left chest soreness, lower probability of cardiac etiology, especially when he has a negative Myoview in September 2016. We will observe for now.    Past  Medical History:  Diagnosis Date  . Atypical chest pain    Myoview performed 07/23/11 was completely normal. Post stress EF 61%.  . Bruit    Left asymptomatic bruit. Carotid Duplex 12/13/12 =mildly abnormal. *BILATERAL BULB/PROXIMAL ICAs: Demonstrated a mild amount of fibrous plaque w/no evidence od significant diameter reduction, tortuosity or other vascular abnormality.  . Enlarged prostate    BPH - PSA of 6.7 this is consistant with his last PSA of 6.3  . Hyperlipemia   . Hypertension   . Inguinal hernia    Inguinal hernia repair (x) 2 1991, 2011  . Intestinal polyps 09/22/11   Colonoscopy removed a 2 mm sessile cecal polyp with a cold snare.  . Measles   . Mumps   . Normal coronary arteries 2002  . PAF (paroxysmal atrial fibrillation) (HCC)    Transesophageal echo on 07/07/12 - EF =50-55%. Mild atheromatous disease of the proximal descending aorta. Cadioversion 07/07/12 successful.  . Peripheral neuropathy ([place])   . Pneumonia   . Seasonal allergies   . Skin cancer   . Thrombocytopenia ([place]) 2011   Very mild with platelets of 135,000.  [name] Tinnitus   . Umbilical hernia   . Vitamin D deficiency     Past Surgical History:  Procedure Laterality Date  . CARDIAC CATHETERIZATION  2002   "Normal cardiac catheterization"  . CARDIOVERSION  07/07/2012   A fib - Cardioversion successful.  [name] CARDIOVERSION N/A 06/30/2013   Procedure: CARDIOVERSION;  Surgeon: Sanda Klein, MD;  Location: [facility] OR;  Service: Cardiovascular;  Laterality: N/A;  . Carotid Duplex  12/13/12   For asymptomatic bruit. Mildly abnormal.  *BILATERAL BULB/PROXIMAL ICAs: Demonstrated a mild amount of fibrous plaque w/no evidence of significant diameter reduction, tortuosity or other vascular abnormality.   . COLONOSCOPY W/ POLYPECTOMY  09/22/11   2 mm sessile cecal polyp was removed with a cold snare.  [name] HAND SURGERY    . [place] & 2011  . TEE WITHOUT CARDIOVERSION  07/07/2012   Procedure: TRANSESOPHAGEAL  ECHOCARDIOGRAM (TEE);  Surgeon: Pixie Casino, MD;  [facility] ENDOSCOPY;  Service: Cardiovascular;  Laterality: N/A;    Current Medications: Outpatient Medications Prior to Visit  Medication Sig Dispense Refill  . amiodarone (PACERONE) 200 MG tablet TAKE 1 TABLET EVERY DAY 90 tablet 1  . Cholecalciferol (VITAMIN D3) 50000 UNITS CAPS Take 1 capsule by mouth once a week.    . doxazosin (CARDURA) 8 MG tablet Take 1 tablet (8 mg total) by mouth at bedtime. 90 tablet 3  . furosemide (LASIX) 20 MG tablet Take 20 mg by mouth as needed for fluid.    [name] lisinopril (PRINIVIL,ZESTRIL) 2.5 MG tablet Take 2.5 mg by mouth daily as needed (blood pressure).     . OMEGA-3 KRILL OIL PO Take 1 capsule by mouth daily.    . psyllium (METAMUCIL) 58.6 % powder Take 1 packet by mouth as needed (constipation).     . rivaroxaban (XARELTO) 20 MG TABS tablet Take 1 tablet (20 mg total) by mouth  daily with supper. 90 tablet 3  . simvastatin (ZOCOR) 5 MG tablet Take 5 mg by mouth at bedtime.    . triamcinolone cream (KENALOG) 0.1 % Apply 1 application topically 2 (two) times daily as needed (SKIN RASHES).      No facility-administered medications prior to visit.      Allergies:   Prednisone and Doxycycline   Social History   Social History  . Marital status: Widowed    Spouse name: N/A  . Number of children: 2  . Years of education: N/A   Occupational History  .  Retired    Landscape architect   Social History Main Topics  . Smoking status: Former Smoker    Types: Cigarettes    Quit date: 11/03/1963  . Smokeless tobacco: Never Used  . Alcohol use No  . Drug use: No  . Sexual activity: Not Asked   Other Topics Concern  . None   Social History Narrative  . None     Family History:  The patient's family history includes Heart failure in his brother.   ROS:   Please see the history of present illness.    ROS All other systems reviewed and are negative.   PHYSICAL EXAM:   VS:  BP (!) 170/64  (BP Location: Right Arm, Patient Position: Sitting, Cuff Size: Normal)   Pulse 61   Ht 5\' 11"  (1.803 m)   Wt 198 lb 3.2 oz (89.9 kg)   SpO2 99%   BMI 27.64 kg/m    GEN: Well nourished, well developed, in no acute distress  HEENT: normal  Neck: no JVD, carotid bruits, or masses Cardiac: RRR; no murmurs, rubs, or gallops,no edema  Respiratory:  clear to auscultation bilaterally, normal work of breathing GI: soft, nontender, nondistended, + BS MS: no deformity or atrophy  Skin: warm and dry, no rash Neuro:  Alert and Oriented x 3, Strength and sensation are intact Psych: euthymic mood, full affect  Wt Readings from Last 3 Encounters:  08/14/16 198 lb 3.2 oz (89.9 kg)  03/26/16 191 lb (86.6 kg)  10/01/15 198 lb (89.8 kg)      Studies/Labs Reviewed:   EKG:  EKG is ordered today.  The ekg ordered today demonstrates Normal sinus rhythm without significant ST-T wave changes.  Recent Labs: No results found for requested labs within last 8760 hours.   Lipid Panel No results found for: CHOL, TRIG, HDL, CHOLHDL, VLDL, LDLCALC, LDLDIRECT  Additional studies/ records that were reviewed today include:    Myoview 07/18/2015 Study Highlights    Nuclear stress EF: 60%.  The left ventricular ejection fraction is normal (55-65%).  There was no ST segment deviation noted during stress.  Defect 1: There is a small defect of mild severity present in the basal inferoseptal and basal inferior location.  This is a low risk study.   Low risk lexiscan nuclear study demonstrating a small mild basal inferior-inferosepatal defect c/w diaphragmatic attenuation artifact. No ischemia. EF 60% with normal systolic thickening and wall motion.      ASSESSMENT:    1. Bradycardia   2. Labile hypertension   3. Dizziness   4. Atypical chest pain   5. PAF (paroxysmal atrial fibrillation) ([place])   6. Hyperlipidemia, unspecified hyperlipidemia type      PLAN:  In order of problems listed  above:  1. Bradycardia: Only rate control medication at this point as amiodarone, we did discuss potentially cut the amiodarone back down to 100 mg daily, however his  recurrence of atrial fibrillation is of concern. Given his dizziness, we will obtain a 2 week's event monitor to further evaluate heart rate before referring him to Dr. [name]  2. Labile HTN: Will use a short acting PRN hydralazine instead, blood pressure range from [id number] systolic. Hesitant to put him on schedule medication.  3. Dizziness: Has been chronic, seems to be related to bradycardia, hypotension and hypertension.  4. PAF on amiodarone and a Xarelto.  - This patients CHA2DS2-VASc Score and unadjusted Ischemic Stroke Rate (% per year) is equal to 4.8 % stroke rate/year from a score of 4  Above score calculated as 1 point each if present [CHF, HTN, DM, Vascular=MI/PAD/Aortic Plaque, Age if 65-74, or Male] Above score calculated as 2 points each if present [Age > 75, or Stroke/TIA/TE]  5. Hyperlipidemia: On Zocor. Based on labs sent over by his PCP, his cholesterol is well-controlled. LFT normal.  6.  Atypical chest pain: Left-sided, persistent for several weeks, worse with left arm rotation, lower suspicion for cardiac issue. We'll continue to observe for now. Note he had a normal Myoview in September 2016.   Medication Adjustments/Labs and Tests Ordered: Current medicines are reviewed at length with the patient today.  Concerns regarding medicines are outlined above.  Medication changes, Labs and Tests ordered today are listed in the Patient Instructions below. Patient Instructions  Medication Instructions:  START- Hydralazine 10 mg as needed, If systolic blood pressure get above 160, no more than 1 tablet a day  Labwork: None Ordered  Testing/Procedures: Your physician has recommended that you wear an event monitor for 2 weeks. Event monitors are medical devices that record the heart's electrical activity.  Doctors most often [place] these monitors to diagnose arrhythmias. Arrhythmias are problems with the speed or rhythm of the heartbeat. The monitor is a small, portable device. You can wear one while you do your normal daily activities. This is usually used to diagnose what is causing palpitations/syncope (passing out).  Follow-Up: Your physician recommends that you schedule a follow-up appointment in: 4 weeks with Dr [name]   Any Other Special Instructions Will Be Listed Below (If Applicable).   If you need a refill on your cardiac medications before your next appointment, please call your pharmacy.      Stanley Waters, [place]  08/14/2016 11:19 PM    [facility] [address], [place], [place]  [address] Phone: [phone]; Fax: [phone]

## 2016-08-14 NOTE — Patient Instructions (Signed)
Medication Instructions:  START- Hydralazine 10 mg as needed, If systolic blood pressure get above 160, no more than 1 tablet a day  Labwork: None Ordered  Testing/Procedures: Your physician has recommended that you wear an event monitor for 2 weeks. Event monitors are medical devices that record the heart's electrical activity. Doctors most often [place] these monitors to diagnose arrhythmias. Arrhythmias are problems with the speed or rhythm of the heartbeat. The monitor is a small, portable device. You can wear one while you do your normal daily activities. This is usually used to diagnose what is causing palpitations/syncope (passing out).  Follow-Up: Your physician recommends that you schedule a follow-up appointment in: 4 weeks with Dr [name]   Any Other Special Instructions Will Be Listed Below (If Applicable).   If you need a refill on your cardiac medications before your next appointment, please call your pharmacy.

## 2016-08-19 ENCOUNTER — Ambulatory Visit: Payer: Commercial Managed Care - HMO | Admitting: Cardiovascular Disease

## 2016-09-07 DIAGNOSIS — I4891 Unspecified atrial fibrillation: Secondary | ICD-10-CM | POA: Diagnosis not present

## 2016-09-07 DIAGNOSIS — E789 Disorder of lipoprotein metabolism, unspecified: Secondary | ICD-10-CM | POA: Diagnosis not present

## 2016-09-07 DIAGNOSIS — R05 Cough: Secondary | ICD-10-CM | POA: Diagnosis not present

## 2016-09-08 ENCOUNTER — Ambulatory Visit: Payer: Commercial Managed Care - HMO | Admitting: Cardiovascular Disease

## 2016-09-28 ENCOUNTER — Other Ambulatory Visit: Payer: Self-pay | Admitting: Cardiology

## 2016-11-10 ENCOUNTER — Ambulatory Visit (INDEPENDENT_AMBULATORY_CARE_PROVIDER_SITE_OTHER): Payer: Medicare HMO | Admitting: Cardiovascular Disease

## 2016-11-10 ENCOUNTER — Encounter: Payer: Self-pay | Admitting: Cardiovascular Disease

## 2016-11-10 DIAGNOSIS — E7800 Pure hypercholesterolemia, unspecified: Secondary | ICD-10-CM | POA: Diagnosis not present

## 2016-11-10 DIAGNOSIS — I480 Paroxysmal atrial fibrillation: Secondary | ICD-10-CM | POA: Diagnosis not present

## 2016-11-10 DIAGNOSIS — I10 Essential (primary) hypertension: Secondary | ICD-10-CM

## 2016-11-10 NOTE — Assessment & Plan Note (Addendum)
History of paroxysmal atrial fibrillation maintaining normal sinus rhythm on Xarelto  oral anticoagulation. He is on no rate controlling meds. Occasionally he becomes bradycardic. Recent event monitor showed sinus bradycardia in the 30s and 40s although he is for the most part asymptomatic from this. He is on amiodarone

## 2016-11-10 NOTE — Assessment & Plan Note (Signed)
History of hyperlipidemia on statin therapy followed by his PCP 

## 2016-11-10 NOTE — Assessment & Plan Note (Signed)
History of hypertension blood pressure measured 120/72. He is on hydralazine and lisinopril. Continue current meds at current dosing

## 2016-11-10 NOTE — Patient Instructions (Signed)

## 2016-11-10 NOTE — Progress Notes (Signed)
11/10/2016 Stanley Waters   16-Jun-1935  [id number]  Primary Physician [name], MD Primary Cardiologist: Lorretta Harp MD Lupe Carney, [place]  HPI:   The patient is a very pleasant 82 year old mildly overweight widowed Caucasian male father of 52, grandfather of 2 grandchildren who I last saw 10/01/15.[name] He has a history of normal coronary arteries by cath in 2002. He has PAF and has undergone a transesophageal echo, most recently by Dr. [name] July 07, 2012. His other problems include hypertension and hyperlipidemia. I adjusted his medications because he was bradycardic when I last saw him and changed his Eliquis to Xarelto at his request because of cost. He apparently has developed a rash since that time which he attributes to the Xarelto. He was admitted to the hospital on 06/29/13 with A. Fib with RVR. Ultimately underwent bedside DC cardioversion by Dr. [name] successfully to sinus rhythm after being switched from Rythmol to amiodarone. Since discharge he had multiple episodes of right true PAF responsive to when necessary supplemental metoprolol. He wore an event monitor for 2 weeks but did show episodes of bradycardia.since I saw him last in September he has had no episodes of atrial fibrillation.  His metoprolol was discontinued as was his verapamil. He has had heart rates in the 40s and is symptomatic from this with dizziness. He had a stress test performed 07/18/15 which is low risk. Because of labile hypertension and bradycardia he saw Almyra Deforest [facility]  in the office 08/14/16. Event monitor showed sinus rhythm with several episodes of sinus bradycardia in the 30s and 40s although he is for the most part asymptomatic from these.   Current Outpatient Prescriptions  Medication Sig Dispense Refill  . amiodarone (PACERONE) 200 MG tablet Take 1 tablet (200 mg total) by mouth daily. 90 tablet 0  . Cholecalciferol (VITAMIN D3) 50000 UNITS CAPS Take 1 capsule by mouth once a  week.    . doxazosin (CARDURA) 8 MG tablet Take 1 tablet (8 mg total) by mouth at bedtime. 90 tablet 3  . furosemide (LASIX) 20 MG tablet Take 20 mg by mouth as needed for fluid.    . hydrALAZINE (APRESOLINE) 10 MG tablet Take 1 tablet (10 mg total) by mouth as needed. If systolic blood pressure is greater than 160. 30 tablet 3  . lisinopril (PRINIVIL,ZESTRIL) 2.5 MG tablet Take 2.5 mg by mouth daily as needed (blood pressure).     . OMEGA-3 KRILL OIL PO Take 1 capsule by mouth daily.    . psyllium (METAMUCIL) 58.6 % powder Take 1 packet by mouth as needed (constipation).     . rivaroxaban (XARELTO) 20 MG TABS tablet Take 1 tablet (20 mg total) by mouth daily with supper. 90 tablet 3  . simvastatin (ZOCOR) 5 MG tablet Take 5 mg by mouth at bedtime.    . triamcinolone cream (KENALOG) 0.1 % Apply 1 application topically 2 (two) times daily as needed (SKIN RASHES).      No current facility-administered medications for this visit.     Allergies  Allergen Reactions  . Prednisone Swelling, Other (See Comments) and Hypertension    Tachycardia  . Doxycycline Rash    Social History   Social History  . Marital status: Widowed    Spouse name: N/A  . Number of children: 2  . Years of education: N/A   Occupational History  .  Retired    Landscape architect   Social History Main Topics  . Smoking status:  Former Smoker    Types: Cigarettes    Quit date: 11/03/1963  . Smokeless tobacco: Never Used  . Alcohol use No  . Drug use: No  . Sexual activity: Not on file   Other Topics Concern  . Not on file   Social History Narrative  . No narrative on file     Review of Systems: General: negative for chills, fever, night sweats or weight changes.  Cardiovascular: negative for chest pain, dyspnea on exertion, edema, orthopnea, palpitations, paroxysmal nocturnal dyspnea or shortness of breath Dermatological: negative for rash Respiratory: negative for cough or wheezing Urologic: negative for  hematuria Abdominal: negative for nausea, vomiting, diarrhea, bright red blood per rectum, melena, or hematemesis Neurologic: negative for visual changes, syncope, or dizziness All other systems reviewed and are otherwise negative except as noted above.    Blood pressure 140/72, pulse (!) 52, height 6' (1.829 m), weight 200 lb (90.7 kg).  General appearance: alert and no distress Neck: no adenopathy, no carotid bruit, no JVD, supple, symmetrical, trachea midline and thyroid not enlarged, symmetric, no tenderness/mass/nodules Lungs: clear to auscultation bilaterally Heart: regular rate and rhythm, S1, S2 normal, no murmur, click, rub or gallop Extremities: extremities normal, atraumatic, no cyanosis or edema  EKG not performed today  ASSESSMENT AND PLAN:   Paroxysmal atrial fibrillation History of paroxysmal atrial fibrillation maintaining normal sinus rhythm on Xarelto  oral anticoagulation. He is on no rate controlling meds. Occasionally he becomes bradycardic. Recent event monitor showed sinus bradycardia in the 30s and 40s although he is for the most part asymptomatic from this. He is on amiodarone  Hyperlipidemia History of hyperlipidemia on statin therapy followed by his PCP  Essential hypertension History of hypertension blood pressure measured 120/72. He is on hydralazine and lisinopril. Continue current meds at current dosing      Lorretta Harp MD [facility], [facility] 11/10/2016 11:25 AM

## 2016-11-25 DIAGNOSIS — M25572 Pain in left ankle and joints of left foot: Secondary | ICD-10-CM | POA: Diagnosis not present

## 2016-11-25 DIAGNOSIS — L600 Ingrowing nail: Secondary | ICD-10-CM | POA: Diagnosis not present

## 2016-11-25 DIAGNOSIS — M792 Neuralgia and neuritis, unspecified: Secondary | ICD-10-CM | POA: Diagnosis not present

## 2016-11-25 DIAGNOSIS — M2042 Other hammer toe(s) (acquired), left foot: Secondary | ICD-10-CM | POA: Diagnosis not present

## 2016-11-30 ENCOUNTER — Other Ambulatory Visit: Payer: Self-pay | Admitting: Cardiovascular Disease

## 2016-12-01 NOTE — Telephone Encounter (Signed)
Rx(s) sent to pharmacy electronically.  

## 2017-01-18 DIAGNOSIS — R972 Elevated prostate specific antigen [PSA]: Secondary | ICD-10-CM | POA: Diagnosis not present

## 2017-01-25 DIAGNOSIS — R351 Nocturia: Secondary | ICD-10-CM | POA: Diagnosis not present

## 2017-01-25 DIAGNOSIS — R972 Elevated prostate specific antigen [PSA]: Secondary | ICD-10-CM | POA: Diagnosis not present

## 2017-01-25 DIAGNOSIS — N401 Enlarged prostate with lower urinary tract symptoms: Secondary | ICD-10-CM | POA: Diagnosis not present

## 2017-02-01 DIAGNOSIS — M2011 Hallux valgus (acquired), right foot: Secondary | ICD-10-CM | POA: Diagnosis not present

## 2017-02-01 DIAGNOSIS — L600 Ingrowing nail: Secondary | ICD-10-CM | POA: Diagnosis not present

## 2017-02-01 DIAGNOSIS — M25572 Pain in left ankle and joints of left foot: Secondary | ICD-10-CM | POA: Diagnosis not present

## 2017-02-08 DIAGNOSIS — E559 Vitamin D deficiency, unspecified: Secondary | ICD-10-CM | POA: Diagnosis not present

## 2017-02-08 DIAGNOSIS — Z125 Encounter for screening for malignant neoplasm of prostate: Secondary | ICD-10-CM | POA: Diagnosis not present

## 2017-02-08 DIAGNOSIS — E785 Hyperlipidemia, unspecified: Secondary | ICD-10-CM | POA: Diagnosis not present

## 2017-02-08 DIAGNOSIS — M109 Gout, unspecified: Secondary | ICD-10-CM | POA: Diagnosis not present

## 2017-02-08 DIAGNOSIS — Z0000 Encounter for general adult medical examination without abnormal findings: Secondary | ICD-10-CM | POA: Diagnosis not present

## 2017-02-08 DIAGNOSIS — G629 Polyneuropathy, unspecified: Secondary | ICD-10-CM | POA: Diagnosis not present

## 2017-02-08 DIAGNOSIS — I10 Essential (primary) hypertension: Secondary | ICD-10-CM | POA: Diagnosis not present

## 2017-02-12 DIAGNOSIS — T8189XA Other complications of procedures, not elsewhere classified, initial encounter: Secondary | ICD-10-CM | POA: Diagnosis not present

## 2017-02-15 DIAGNOSIS — I129 Hypertensive chronic kidney disease with stage 1 through stage 4 chronic kidney disease, or unspecified chronic kidney disease: Secondary | ICD-10-CM | POA: Diagnosis not present

## 2017-02-15 DIAGNOSIS — I2510 Atherosclerotic heart disease of native coronary artery without angina pectoris: Secondary | ICD-10-CM | POA: Diagnosis not present

## 2017-02-15 DIAGNOSIS — I4891 Unspecified atrial fibrillation: Secondary | ICD-10-CM | POA: Diagnosis not present

## 2017-02-15 DIAGNOSIS — D709 Neutropenia, unspecified: Secondary | ICD-10-CM | POA: Diagnosis not present

## 2017-02-22 DIAGNOSIS — H6122 Impacted cerumen, left ear: Secondary | ICD-10-CM | POA: Diagnosis not present

## 2017-02-22 DIAGNOSIS — M109 Gout, unspecified: Secondary | ICD-10-CM | POA: Diagnosis not present

## 2017-02-22 DIAGNOSIS — M25462 Effusion, left knee: Secondary | ICD-10-CM | POA: Diagnosis not present

## 2017-02-22 DIAGNOSIS — M79675 Pain in left toe(s): Secondary | ICD-10-CM | POA: Diagnosis not present

## 2017-02-22 DIAGNOSIS — L03032 Cellulitis of left toe: Secondary | ICD-10-CM | POA: Diagnosis not present

## 2017-02-23 ENCOUNTER — Other Ambulatory Visit (HOSPITAL_COMMUNITY): Payer: Self-pay | Admitting: Family Medicine

## 2017-02-23 DIAGNOSIS — L03032 Cellulitis of left toe: Secondary | ICD-10-CM | POA: Diagnosis not present

## 2017-02-23 DIAGNOSIS — M79605 Pain in left leg: Secondary | ICD-10-CM

## 2017-02-23 DIAGNOSIS — M7989 Other specified soft tissue disorders: Principal | ICD-10-CM

## 2017-02-23 DIAGNOSIS — M79662 Pain in left lower leg: Secondary | ICD-10-CM | POA: Diagnosis not present

## 2017-02-24 ENCOUNTER — Ambulatory Visit (HOSPITAL_COMMUNITY)
Admission: RE | Admit: 2017-02-24 | Discharge: 2017-02-24 | Disposition: A | Discharge status: Home / Self Care | Payer: Medicare HMO | Source: Ambulatory Visit | Attending: Internal Medicine | Admitting: Internal Medicine

## 2017-02-24 DIAGNOSIS — M7989 Other specified soft tissue disorders: Secondary | ICD-10-CM | POA: Diagnosis not present

## 2017-02-24 DIAGNOSIS — L03032 Cellulitis of left toe: Secondary | ICD-10-CM | POA: Diagnosis not present

## 2017-02-24 DIAGNOSIS — M79662 Pain in left lower leg: Secondary | ICD-10-CM | POA: Diagnosis not present

## 2017-02-24 DIAGNOSIS — M79605 Pain in left leg: Secondary | ICD-10-CM

## 2017-02-24 NOTE — Progress Notes (Signed)
*  Preliminary Results* Left lower extremity venous duplex completed. Left lower extremity is negative for deep vein thrombosis. There is no evidence of left Baker's cyst.  02/24/2017 11:18 AM  Maudry Mayhew, BS, RVT, RDCS, RDMS

## 2017-02-25 DIAGNOSIS — L03032 Cellulitis of left toe: Secondary | ICD-10-CM | POA: Diagnosis not present

## 2017-03-01 DIAGNOSIS — L03032 Cellulitis of left toe: Secondary | ICD-10-CM | POA: Diagnosis not present

## 2017-03-08 DIAGNOSIS — L03032 Cellulitis of left toe: Secondary | ICD-10-CM | POA: Diagnosis not present

## 2017-03-08 DIAGNOSIS — G629 Polyneuropathy, unspecified: Secondary | ICD-10-CM | POA: Diagnosis not present

## 2017-03-08 DIAGNOSIS — M109 Gout, unspecified: Secondary | ICD-10-CM | POA: Diagnosis not present

## 2017-06-08 DIAGNOSIS — R972 Elevated prostate specific antigen [PSA]: Secondary | ICD-10-CM | POA: Diagnosis not present

## 2017-06-10 DIAGNOSIS — M25511 Pain in right shoulder: Secondary | ICD-10-CM | POA: Diagnosis not present

## 2017-06-10 DIAGNOSIS — S46911A Strain of unspecified muscle, fascia and tendon at shoulder and upper arm level, right arm, initial encounter: Secondary | ICD-10-CM | POA: Diagnosis not present

## 2017-06-22 ENCOUNTER — Telehealth: Payer: Self-pay | Admitting: Cardiovascular Disease

## 2017-06-22 MED ORDER — RIVAROXABAN 20 MG PO TABS: 20.0000 mg | ORAL_TABLET | Freq: Every day | ORAL | 1 refills | Status: DC

## 2017-06-22 NOTE — Telephone Encounter (Signed)
°*  STAT* If patient is at the pharmacy, call can be transferred to refill team.   1. Which medications need to be refilled? (please list name of each medication and dose if known) Xarelto 20 mg  2. Which pharmacy/location (including street and city if local pharmacy) is medication to be sent to? [facility]   3. Do they need a 30 day or 90 day supply? [place]

## 2017-06-22 NOTE — Telephone Encounter (Signed)
Refill submitted to patient's preferred pharmacy. Informed patient. Pt voiced understanding, no other stated concerns at this time.  

## 2017-08-11 DIAGNOSIS — E785 Hyperlipidemia, unspecified: Secondary | ICD-10-CM | POA: Diagnosis not present

## 2017-08-11 DIAGNOSIS — I10 Essential (primary) hypertension: Secondary | ICD-10-CM | POA: Diagnosis not present

## 2017-08-11 DIAGNOSIS — E559 Vitamin D deficiency, unspecified: Secondary | ICD-10-CM | POA: Diagnosis not present

## 2017-08-18 DIAGNOSIS — M109 Gout, unspecified: Secondary | ICD-10-CM | POA: Diagnosis not present

## 2017-08-18 DIAGNOSIS — N182 Chronic kidney disease, stage 2 (mild): Secondary | ICD-10-CM | POA: Diagnosis not present

## 2017-08-18 DIAGNOSIS — I4891 Unspecified atrial fibrillation: Secondary | ICD-10-CM | POA: Diagnosis not present

## 2017-08-18 DIAGNOSIS — D696 Thrombocytopenia, unspecified: Secondary | ICD-10-CM | POA: Diagnosis not present

## 2017-08-18 DIAGNOSIS — E785 Hyperlipidemia, unspecified: Secondary | ICD-10-CM | POA: Diagnosis not present

## 2017-08-18 DIAGNOSIS — I2510 Atherosclerotic heart disease of native coronary artery without angina pectoris: Secondary | ICD-10-CM | POA: Diagnosis not present

## 2017-08-18 DIAGNOSIS — I10 Essential (primary) hypertension: Secondary | ICD-10-CM | POA: Diagnosis not present

## 2017-08-18 DIAGNOSIS — D709 Neutropenia, unspecified: Secondary | ICD-10-CM | POA: Diagnosis not present

## 2017-08-18 DIAGNOSIS — M8580 Other specified disorders of bone density and structure, unspecified site: Secondary | ICD-10-CM | POA: Diagnosis not present

## 2017-08-24 ENCOUNTER — Other Ambulatory Visit: Payer: Self-pay | Admitting: Cardiovascular Disease

## 2017-08-24 DIAGNOSIS — H26492 Other secondary cataract, left eye: Secondary | ICD-10-CM | POA: Diagnosis not present

## 2017-08-24 DIAGNOSIS — H353131 Nonexudative age-related macular degeneration, bilateral, early dry stage: Secondary | ICD-10-CM | POA: Diagnosis not present

## 2017-08-24 DIAGNOSIS — H35033 Hypertensive retinopathy, bilateral: Secondary | ICD-10-CM | POA: Diagnosis not present

## 2017-08-24 DIAGNOSIS — Z961 Presence of intraocular lens: Secondary | ICD-10-CM | POA: Diagnosis not present

## 2017-09-07 DIAGNOSIS — L718 Other rosacea: Secondary | ICD-10-CM | POA: Diagnosis not present

## 2017-09-07 DIAGNOSIS — Z85828 Personal history of other malignant neoplasm of skin: Secondary | ICD-10-CM | POA: Diagnosis not present

## 2017-09-07 DIAGNOSIS — D692 Other nonthrombocytopenic purpura: Secondary | ICD-10-CM | POA: Diagnosis not present

## 2017-09-07 DIAGNOSIS — L738 Other specified follicular disorders: Secondary | ICD-10-CM | POA: Diagnosis not present

## 2017-09-07 DIAGNOSIS — B354 Tinea corporis: Secondary | ICD-10-CM | POA: Diagnosis not present

## 2017-09-07 DIAGNOSIS — B353 Tinea pedis: Secondary | ICD-10-CM | POA: Diagnosis not present

## 2017-09-07 DIAGNOSIS — B351 Tinea unguium: Secondary | ICD-10-CM | POA: Diagnosis not present

## 2017-09-07 DIAGNOSIS — L821 Other seborrheic keratosis: Secondary | ICD-10-CM | POA: Diagnosis not present

## 2017-11-09 DIAGNOSIS — I10 Essential (primary) hypertension: Secondary | ICD-10-CM | POA: Diagnosis not present

## 2017-11-09 DIAGNOSIS — M2480 Other specific joint derangements of unspecified joint, not elsewhere classified: Secondary | ICD-10-CM | POA: Diagnosis not present

## 2017-11-09 DIAGNOSIS — G2581 Restless legs syndrome: Secondary | ICD-10-CM | POA: Diagnosis not present

## 2017-11-10 ENCOUNTER — Other Ambulatory Visit: Payer: Self-pay | Admitting: Cardiovascular Disease

## 2017-11-12 ENCOUNTER — Encounter: Payer: Self-pay | Admitting: Cardiovascular Disease

## 2017-11-12 ENCOUNTER — Ambulatory Visit: Payer: Medicare HMO | Admitting: Cardiovascular Disease

## 2017-11-12 DIAGNOSIS — E7800 Pure hypercholesterolemia, unspecified: Secondary | ICD-10-CM | POA: Diagnosis not present

## 2017-11-12 DIAGNOSIS — I10 Essential (primary) hypertension: Secondary | ICD-10-CM

## 2017-11-12 DIAGNOSIS — I480 Paroxysmal atrial fibrillation: Secondary | ICD-10-CM | POA: Diagnosis not present

## 2017-11-12 NOTE — Assessment & Plan Note (Signed)
History of hyperlipidemia on statin therapy with recent lipid profile performed by his PCP 08/11/17 revealing total cholesterol 145, LDL 67 and HDL of 64.

## 2017-11-12 NOTE — Patient Instructions (Signed)

## 2017-11-12 NOTE — Progress Notes (Signed)
11/12/2017 Stanley Waters   1934/11/05  [id number]  Primary Physician Vassie Moment, NP Primary Cardiologist: Lorretta Harp MD FACP, [place], [place], [place]  HPI:  Stanley Waters is a 83 y.o.  mildly overweight widowed Caucasian male father of 34, grandfather of 2 grandchildren who I last saw  11/10/16.[name] He has a history of normal coronary arteries by cath in 2002. He has PAF and has undergone a transesophageal echo, most recently by Dr. [name] July 07, 2012. His other problems include hypertension and hyperlipidemia. I adjusted his medications because he was bradycardic when I last saw him and changed his Eliquis to Xarelto at his request because of cost. He apparently has developed a rash since that time which he attributes to the Xarelto. He was admitted to the hospital on 06/29/13 with A. Fib with RVR. Ultimately underwent bedside DC cardioversion by Dr. [name] successfully to sinus rhythm after being switched from Rythmol to amiodarone. Since discharge he had multiple episodes of right true PAF responsive to when necessary supplemental metoprolol. He wore an event monitor for 2 weeks but did show episodes of bradycardia.since I saw him last in September he has had no episodes of atrial fibrillation. His metoprolol was discontinued as was his verapamil. He has had heart rates in the 40s and is symptomatic from this with dizziness. He had a stress test performed 07/18/15 which is low risk. Because of labile hypertension and bradycardia he saw Almyra Deforest [facility]  in the office 08/14/16. Event monitor showed sinus rhythm with several episodes of sinus bradycardia in the 30s and 40s although he is for the most part asymptomatic from these. Since I saw him a year ago his regimen currently stable denying chest pain or shortness of breath. He said no clinical episodes of atrial fibrillation.      Current Meds  Medication Sig  . amiodarone (PACERONE) 200 MG tablet TAKE 1 TABLET (200 MG TOTAL)  BY MOUTH DAILY.  [name] Cholecalciferol (VITAMIN D3) 50000 UNITS CAPS Take 1 capsule by mouth once a week.  . doxazosin (CARDURA) 8 MG tablet Take 1 tablet (8 mg total) by mouth at bedtime.  . furosemide (LASIX) 20 MG tablet Take 20 mg by mouth as needed for fluid.  . hydrALAZINE (APRESOLINE) 10 MG tablet Take 1 tablet (10 mg total) by mouth as needed. If systolic blood pressure is greater than 160.  [name] lisinopril (PRINIVIL,ZESTRIL) 20 MG tablet Take 20 mg by mouth daily as needed (blood pressure).   . OMEGA-3 KRILL OIL PO Take 1 capsule by mouth daily.  . psyllium (METAMUCIL) 58.6 % powder Take 1 packet by mouth as needed (constipation).   . simvastatin (ZOCOR) 5 MG tablet Take 5 mg by mouth at bedtime.  . triamcinolone cream (KENALOG) 0.1 % Apply 1 application topically 2 (two) times daily as needed (SKIN RASHES).   [name] 20 MG TABS tablet TAKE 1 TABLET EVERY DAY WITH SUPPER     Allergies  Allergen Reactions  . Prednisone Swelling, Other (See Comments) and Hypertension    Tachycardia  . Doxycycline Rash    Social History   Socioeconomic History  . Marital status: Widowed    Spouse name: Not on file  . Number of children: 2  . Years of education: Not on file  . Highest education level: Not on file  Social Needs  . Financial resource strain: Not on file  . Food insecurity - worry: Not on file  . Food insecurity -  inability: Not on file  . Transportation needs - medical: Not on file  . Transportation needs - non-medical: Not on file  Occupational History    Employer: RETIRED    Comment: Landscape architect  Tobacco Use  . Smoking status: Former Smoker    Types: Cigarettes    Last attempt to quit: 11/03/1963    Years since quitting: 54.0  . Smokeless tobacco: Never Used  Substance and Sexual Activity  . Alcohol use: No  . Drug use: No  . Sexual activity: Not on file  Other Topics Concern  . Not on file  Social History Narrative  . Not on file     Review of  Systems: General: negative for chills, fever, night sweats or weight changes.  Cardiovascular: negative for chest pain, dyspnea on exertion, edema, orthopnea, palpitations, paroxysmal nocturnal dyspnea or shortness of breath Dermatological: negative for rash Respiratory: negative for cough or wheezing Urologic: negative for hematuria Abdominal: negative for nausea, vomiting, diarrhea, bright red blood per rectum, melena, or hematemesis Neurologic: negative for visual changes, syncope, or dizziness All other systems reviewed and are otherwise negative except as noted above.    Blood pressure 138/70, pulse 60, height 6' (1.829 m), weight 198 lb 3.2 oz (89.9 kg).  General appearance: alert and no distress Neck: no adenopathy, no carotid bruit, no JVD, supple, symmetrical, trachea midline and thyroid not enlarged, symmetric, no tenderness/mass/nodules Lungs: clear to auscultation bilaterally Heart: regular rate and rhythm, S1, S2 normal, no murmur, click, rub or gallop Extremities: extremities normal, atraumatic, no cyanosis or edema Pulses: 2+ and symmetric Skin: Skin color, texture, turgor normal. No rashes or lesions Neurologic: Alert and oriented X 3, normal strength and tone. Normal symmetric reflexes. Normal coordination and gait  EKG sinus rhythm at 60 without ST or T-wave changes. I personally reviewed this EKG.  ASSESSMENT AND PLAN:   Paroxysmal atrial fibrillation History of paroxysmal atrial fibrillation maintaining sinus rhythm on amiodarone and Xarelto   Hyperlipidemia History of hyperlipidemia on statin therapy with recent lipid profile performed by his PCP 08/11/17 revealing total cholesterol 145, LDL 67 and HDL of 64.  Essential hypertension History of essential hypertension blood pressure measured at 138/70. He is on hydralazine and lisinopril. Continue current meds at current dosing.      Lorretta Harp MD FACP,FACC,FAHA, [facility] 11/12/2017 11:11 AM

## 2017-11-12 NOTE — Assessment & Plan Note (Signed)
History of essential hypertension blood pressure measured at 138/70. He is on hydralazine and lisinopril. Continue current meds at current dosing.

## 2017-11-12 NOTE — Assessment & Plan Note (Signed)
History of paroxysmal atrial fibrillation maintaining sinus rhythm on amiodarone and Xarelto

## 2017-12-07 DIAGNOSIS — E559 Vitamin D deficiency, unspecified: Secondary | ICD-10-CM | POA: Diagnosis not present

## 2017-12-07 DIAGNOSIS — Z1321 Encounter for screening for nutritional disorder: Secondary | ICD-10-CM | POA: Diagnosis not present

## 2017-12-07 DIAGNOSIS — G2581 Restless legs syndrome: Secondary | ICD-10-CM | POA: Diagnosis not present

## 2017-12-07 DIAGNOSIS — M2480 Other specific joint derangements of unspecified joint, not elsewhere classified: Secondary | ICD-10-CM | POA: Diagnosis not present

## 2017-12-07 DIAGNOSIS — I10 Essential (primary) hypertension: Secondary | ICD-10-CM | POA: Diagnosis not present

## 2017-12-14 DIAGNOSIS — J019 Acute sinusitis, unspecified: Secondary | ICD-10-CM | POA: Diagnosis not present

## 2018-01-04 DIAGNOSIS — I10 Essential (primary) hypertension: Secondary | ICD-10-CM | POA: Diagnosis not present

## 2018-01-04 DIAGNOSIS — M2480 Other specific joint derangements of unspecified joint, not elsewhere classified: Secondary | ICD-10-CM | POA: Diagnosis not present

## 2018-01-04 DIAGNOSIS — G2581 Restless legs syndrome: Secondary | ICD-10-CM | POA: Diagnosis not present

## 2018-01-24 DIAGNOSIS — R972 Elevated prostate specific antigen [PSA]: Secondary | ICD-10-CM | POA: Diagnosis not present

## 2018-01-28 DIAGNOSIS — R351 Nocturia: Secondary | ICD-10-CM | POA: Diagnosis not present

## 2018-01-28 DIAGNOSIS — N401 Enlarged prostate with lower urinary tract symptoms: Secondary | ICD-10-CM | POA: Diagnosis not present

## 2018-01-28 DIAGNOSIS — R972 Elevated prostate specific antigen [PSA]: Secondary | ICD-10-CM | POA: Diagnosis not present

## 2018-02-09 DIAGNOSIS — E559 Vitamin D deficiency, unspecified: Secondary | ICD-10-CM | POA: Diagnosis not present

## 2018-02-09 DIAGNOSIS — Z0000 Encounter for general adult medical examination without abnormal findings: Secondary | ICD-10-CM | POA: Diagnosis not present

## 2018-02-09 DIAGNOSIS — N182 Chronic kidney disease, stage 2 (mild): Secondary | ICD-10-CM | POA: Diagnosis not present

## 2018-02-09 DIAGNOSIS — I10 Essential (primary) hypertension: Secondary | ICD-10-CM | POA: Diagnosis not present

## 2018-02-09 DIAGNOSIS — M109 Gout, unspecified: Secondary | ICD-10-CM | POA: Diagnosis not present

## 2018-02-09 DIAGNOSIS — E785 Hyperlipidemia, unspecified: Secondary | ICD-10-CM | POA: Diagnosis not present

## 2018-02-16 DIAGNOSIS — I4891 Unspecified atrial fibrillation: Secondary | ICD-10-CM | POA: Diagnosis not present

## 2018-02-16 DIAGNOSIS — I129 Hypertensive chronic kidney disease with stage 1 through stage 4 chronic kidney disease, or unspecified chronic kidney disease: Secondary | ICD-10-CM | POA: Diagnosis not present

## 2018-02-16 DIAGNOSIS — F17211 Nicotine dependence, cigarettes, in remission: Secondary | ICD-10-CM | POA: Diagnosis not present

## 2018-02-16 DIAGNOSIS — M109 Gout, unspecified: Secondary | ICD-10-CM | POA: Diagnosis not present

## 2018-02-16 DIAGNOSIS — E785 Hyperlipidemia, unspecified: Secondary | ICD-10-CM | POA: Diagnosis not present

## 2018-02-16 DIAGNOSIS — Z0000 Encounter for general adult medical examination without abnormal findings: Secondary | ICD-10-CM | POA: Diagnosis not present

## 2018-02-16 DIAGNOSIS — N182 Chronic kidney disease, stage 2 (mild): Secondary | ICD-10-CM | POA: Diagnosis not present

## 2018-02-16 DIAGNOSIS — I2510 Atherosclerotic heart disease of native coronary artery without angina pectoris: Secondary | ICD-10-CM | POA: Diagnosis not present

## 2018-02-16 DIAGNOSIS — M8580 Other specified disorders of bone density and structure, unspecified site: Secondary | ICD-10-CM | POA: Diagnosis not present

## 2018-03-01 DIAGNOSIS — R0789 Other chest pain: Secondary | ICD-10-CM | POA: Diagnosis not present

## 2018-03-01 DIAGNOSIS — M25511 Pain in right shoulder: Secondary | ICD-10-CM | POA: Diagnosis not present

## 2018-03-01 DIAGNOSIS — R05 Cough: Secondary | ICD-10-CM | POA: Diagnosis not present

## 2018-03-01 DIAGNOSIS — I10 Essential (primary) hypertension: Secondary | ICD-10-CM | POA: Diagnosis not present

## 2018-03-01 DIAGNOSIS — I4891 Unspecified atrial fibrillation: Secondary | ICD-10-CM | POA: Diagnosis not present

## 2018-03-01 DIAGNOSIS — I2510 Atherosclerotic heart disease of native coronary artery without angina pectoris: Secondary | ICD-10-CM | POA: Diagnosis not present

## 2018-03-10 DIAGNOSIS — H524 Presbyopia: Secondary | ICD-10-CM | POA: Diagnosis not present

## 2018-03-10 DIAGNOSIS — H52209 Unspecified astigmatism, unspecified eye: Secondary | ICD-10-CM | POA: Diagnosis not present

## 2018-04-04 DIAGNOSIS — L718 Other rosacea: Secondary | ICD-10-CM | POA: Diagnosis not present

## 2018-04-04 DIAGNOSIS — H01003 Unspecified blepharitis right eye, unspecified eyelid: Secondary | ICD-10-CM | POA: Diagnosis not present

## 2018-04-04 DIAGNOSIS — H168 Other keratitis: Secondary | ICD-10-CM | POA: Diagnosis not present

## 2018-04-04 DIAGNOSIS — H02103 Unspecified ectropion of right eye, unspecified eyelid: Secondary | ICD-10-CM | POA: Diagnosis not present

## 2018-07-12 ENCOUNTER — Telehealth: Payer: Self-pay | Admitting: Cardiovascular Disease

## 2018-07-12 NOTE — Telephone Encounter (Signed)
Called patient, he states that he has been in and out of Afib since Friday. He denies CP, SOB, dizziness, swelling. He does mention some weakness. BP- 142/75 and HR 61, he states that he can just feel it "skipping beats", patient is continuing to take Xarelto. He was advised to continue medication as prescribed, and to keep appointment with [facility] tomorrow. Advised him if he started having sharp chest pain or chest tightness, or any other symptoms to go to the ER and not wait on his appointment. Patient verbalized understanding.

## 2018-07-12 NOTE — Telephone Encounter (Signed)
New Message     Patient c/o Palpitations:  High priority if patient c/o lightheadedness, shortness of breath, or chest pain  1) How long have you had palpitations/irregular HR/ Afib? Are you having the symptoms now? On and off since Friday. Yes, in afib now  2) Are you currently experiencing lightheadedness, SOB or CP? No symptoms  3) Do you have a history of afib (atrial fibrillation) or irregular heart rhythm? yes  4) Have you checked your BP or HR? (document readings if available): BP 142/75 HR 61  5) Are you experiencing any other symptoms? No    I did make patient an appointment for tomorrow at 2:45pm. He has been in and out of afib this morning.

## 2018-07-13 ENCOUNTER — Encounter: Payer: Self-pay | Admitting: Cardiovascular Disease

## 2018-07-13 ENCOUNTER — Ambulatory Visit: Payer: Medicare HMO | Admitting: Cardiovascular Disease

## 2018-07-13 VITALS — BP 110/78 | HR 68 | Ht 72.0 in | Wt 199.6 lb

## 2018-07-13 DIAGNOSIS — I480 Paroxysmal atrial fibrillation: Secondary | ICD-10-CM

## 2018-07-13 DIAGNOSIS — E7800 Pure hypercholesterolemia, unspecified: Secondary | ICD-10-CM

## 2018-07-13 DIAGNOSIS — Z79899 Other long term (current) drug therapy: Secondary | ICD-10-CM

## 2018-07-13 DIAGNOSIS — I10 Essential (primary) hypertension: Secondary | ICD-10-CM | POA: Diagnosis not present

## 2018-07-13 NOTE — Assessment & Plan Note (Signed)
History of hyperlipidemia on statin therapy. 

## 2018-07-13 NOTE — Patient Instructions (Signed)
Medication Instructions:  Your physician recommends that you continue on your current medications as directed. Please refer to the Current Medication list given to you today.   Labwork: Your physician recommends that you return for lab work in: TODAY   Testing/Procedures: none  Follow-Up: Your physician wants you to follow-up in: 12 months with Dr. [name]. You will receive a reminder letter in the mail two months in advance. If you don't receive a letter, please call our office to schedule the follow-up appointment.   Any Other Special Instructions Will Be Listed Below (If Applicable).     If you need a refill on your cardiac medications before your next appointment, please call your pharmacy.

## 2018-07-13 NOTE — Assessment & Plan Note (Signed)
History of paroxysmal atrial fibrillation on Xarelto and amiodarone.  He had an episode last week which was the first episode he had in over a year that self terminated.  I am going to check thyroid and liver function tests

## 2018-07-13 NOTE — Assessment & Plan Note (Signed)
History of essential hypertension her blood pressure measured at 110/78.  He is on lisinopril and hydralazine.  Continue current meds

## 2018-07-13 NOTE — Progress Notes (Signed)
07/13/2018 WITTEN CERTAIN   1935/03/06  [id number]  Primary Physician Stanley Pretty, MD Primary Cardiologist: Stanley Harp MD FACP, Stanley Waters, Stanley Waters, Stanley Waters  HPI:  Stanley Waters is a 83 y.o.  mildly overweight widowed Caucasian male father of 65, grandfather of 2 grandchildren who I last saw  11/12/2017.[name] He has a history of normal coronary arteries by cath in 2002. He has PAF and has undergone a transesophageal echo, most recently by Dr. Mali Waters July 07, 2012. His other problems include hypertension and hyperlipidemia. I adjusted his medications because he was bradycardic when I last saw him and changed his Eliquis to Xarelto at his request because of cost. He apparently has developed a rash since that time which he attributes to the Xarelto. He was admitted to the hospital on 06/29/13 with A. Fib with RVR. Ultimately underwent bedside DC cardioversion by Dr. Sallyanne Waters successfully to sinus rhythm after being switched from Rythmol to amiodarone. Since discharge he had multiple episodes of right true PAF responsive to when necessary supplemental metoprolol. He wore an event monitor for 2 weeks but did show episodes of bradycardia.since I saw him last in September he has had no episodes of atrial fibrillation. His metoprolol was discontinued as was his verapamil. He has had heart rates in the 40s and is symptomatic from this with dizziness. He had a stress test performed9/15/16 which is low risk. Because of labile hypertension and bradycardia he saw Stanley Waters PACin the office 08/14/16. Event monitor showed sinus rhythm with several episodes of sinus bradycardia in the 30s and 40s although he is for the most part asymptomatic from these.  Since I saw him 8 months ago he had one episode of PAF which self terminated.  Otherwise he denies chest pain or shortness of breath.  He remains on Xarelto.    Current Meds  Medication Sig  . amiodarone (PACERONE) 200 MG tablet TAKE 1 TABLET (200 MG  TOTAL) BY MOUTH DAILY.  [name] Cholecalciferol (VITAMIN D3) 2000 units TABS Take 2,000 Units by mouth daily.  [name] doxazosin (CARDURA) 8 MG tablet Take 1 tablet (8 mg total) by mouth at bedtime.  . furosemide (LASIX) 20 MG tablet Take 20 mg by mouth as needed for fluid.  . hydrALAZINE (APRESOLINE) 10 MG tablet Take 1 tablet (10 mg total) by mouth as needed. If systolic blood pressure is greater than 160.  [name] lisinopril (PRINIVIL,ZESTRIL) 20 MG tablet Take 20 mg by mouth daily as needed (blood pressure).   . OMEGA-3 KRILL OIL PO Take 1 capsule by mouth daily.  . psyllium (METAMUCIL) 58.6 % powder Take 1 packet by mouth as needed (constipation).   . simvastatin (ZOCOR) 5 MG tablet Take 5 mg by mouth at bedtime.  . triamcinolone cream (KENALOG) 0.1 % Apply 1 application topically 2 (two) times daily as needed (SKIN RASHES).   [name] 20 MG TABS tablet TAKE 1 TABLET EVERY DAY WITH SUPPER  . [DISCONTINUED] Cholecalciferol (VITAMIN D3) 50000 UNITS CAPS Take 1 capsule by mouth once a week.     Allergies  Allergen Reactions  . Prednisone Swelling, Other (See Comments) and Hypertension    Tachycardia  . Doxycycline Rash    Social History   Socioeconomic History  . Marital status: Widowed    Spouse name: Not on file  . Number of children: 2  . Years of education: Not on file  . Highest education level: Not on file  Occupational History    Employer: RETIRED  Comment: [employer]  . Financial resource strain: Not on file  . Food insecurity:    Worry: Not on file    Inability: Not on file  . Transportation needs:    Medical: Not on file    Non-medical: Not on file  Tobacco Use  . Smoking status: Former Smoker    Types: Cigarettes    Last attempt to quit: 11/03/1963    Years since quitting: 54.7  . Smokeless tobacco: Never Used  Substance and Sexual Activity  . Alcohol use: No  . Drug use: No  . Sexual activity: Not on file  Lifestyle  . Physical activity:    Days  per week: Not on file    Minutes per session: Not on file  . Stress: Not on file  Relationships  . Social connections:    Talks on phone: Not on file    Gets together: Not on file    Attends religious service: Not on file    Active member of club or organization: Not on file    Attends meetings of clubs or organizations: Not on file    Relationship status: Not on file  . Intimate partner violence:    Fear of current or ex partner: Not on file    Emotionally abused: Not on file    Physically abused: Not on file    Forced sexual activity: Not on file  Other Topics Concern  . Not on file  Social History Narrative  . Not on file     Review of Systems: General: negative for chills, fever, night sweats or weight changes.  Cardiovascular: negative for chest pain, dyspnea on exertion, edema, orthopnea, palpitations, paroxysmal nocturnal dyspnea or shortness of breath Dermatological: negative for rash Respiratory: negative for cough or wheezing Urologic: negative for hematuria Abdominal: negative for nausea, vomiting, diarrhea, bright red blood per rectum, melena, or hematemesis Neurologic: negative for visual changes, syncope, or dizziness All other systems reviewed and are otherwise negative except as noted above.    Blood pressure 110/78, pulse 68, height 6' (1.829 m), weight 199 lb 9.6 oz (90.5 kg).  General appearance: alert and no distress Neck: no adenopathy, no carotid bruit, no JVD, supple, symmetrical, trachea midline and thyroid not enlarged, symmetric, no tenderness/mass/nodules Lungs: clear to auscultation bilaterally Heart: regular rate and rhythm, S1, S2 normal, no murmur, click, rub or gallop Extremities: extremities normal, atraumatic, no cyanosis or edema Pulses: 2+ and symmetric Skin: Skin color, texture, turgor normal. No rashes or lesions Neurologic: Alert and oriented X 3, normal strength and tone. Normal symmetric reflexes. Normal coordination and gait  EKG  not performed today  ASSESSMENT AND PLAN:   Paroxysmal atrial fibrillation History of paroxysmal atrial fibrillation on Xarelto and amiodarone.  He had an episode last week which was the first episode he had in over a year that self terminated.  I am going to check thyroid and liver function tests  Hyperlipidemia History of hyperlipidemia on statin therapy  Essential hypertension History of essential hypertension her blood pressure measured at 110/78.  He is on lisinopril and hydralazine.  Continue current meds      Stanley Harp MD [facility], [facility] 07/13/2018 3:04 PM

## 2018-07-14 LAB — HEPATIC FUNCTION PANEL
ALT: 10 IU/L (ref 0–44)
AST: 16 IU/L (ref 0–40)
Albumin: 4.4 g/dL (ref 3.5–4.7)
Alkaline Phosphatase: 67 IU/L (ref 39–117)
Bilirubin Total: 0.6 mg/dL (ref 0.0–1.2)
Bilirubin, Direct: 0.18 mg/dL (ref 0.00–0.40)
Total Protein: 6.6 g/dL (ref 6.0–8.5)

## 2018-07-14 LAB — T4, FREE: Free T4: 1.81 ng/dL — ABNORMAL HIGH (ref 0.82–1.77)

## 2018-07-14 LAB — TSH: TSH: 3.19 u[IU]/mL (ref 0.450–4.500)

## 2018-07-19 ENCOUNTER — Emergency Department (HOSPITAL_COMMUNITY): Payer: Medicare HMO

## 2018-07-19 ENCOUNTER — Emergency Department (HOSPITAL_COMMUNITY)
Admission: EM | Admit: 2018-07-19 | Discharge: 2018-07-19 | Disposition: A | Discharge status: Home / Self Care | Payer: Medicare HMO | Attending: Emergency Medicine | Admitting: Emergency Medicine

## 2018-07-19 DIAGNOSIS — I4891 Unspecified atrial fibrillation: Secondary | ICD-10-CM | POA: Diagnosis not present

## 2018-07-19 DIAGNOSIS — I10 Essential (primary) hypertension: Secondary | ICD-10-CM | POA: Insufficient documentation

## 2018-07-19 DIAGNOSIS — R0602 Shortness of breath: Secondary | ICD-10-CM | POA: Diagnosis not present

## 2018-07-19 DIAGNOSIS — R0789 Other chest pain: Secondary | ICD-10-CM | POA: Diagnosis not present

## 2018-07-19 DIAGNOSIS — Z87891 Personal history of nicotine dependence: Secondary | ICD-10-CM | POA: Diagnosis not present

## 2018-07-19 DIAGNOSIS — Z79899 Other long term (current) drug therapy: Secondary | ICD-10-CM | POA: Insufficient documentation

## 2018-07-19 DIAGNOSIS — R002 Palpitations: Secondary | ICD-10-CM | POA: Diagnosis present

## 2018-07-19 DIAGNOSIS — R42 Dizziness and giddiness: Secondary | ICD-10-CM | POA: Diagnosis not present

## 2018-07-19 DIAGNOSIS — R531 Weakness: Secondary | ICD-10-CM | POA: Diagnosis not present

## 2018-07-19 DIAGNOSIS — R079 Chest pain, unspecified: Secondary | ICD-10-CM | POA: Diagnosis not present

## 2018-07-19 LAB — CBC
HEMATOCRIT: 42.2 % (ref 39.0–52.0)
Hemoglobin: 14.3 g/dL (ref 13.0–17.0)
MCH: 32.6 pg (ref 26.0–34.0)
MCHC: 33.9 g/dL (ref 30.0–36.0)
MCV: 96.3 fL (ref 78.0–100.0)
Platelets: 114 10*3/uL — ABNORMAL LOW (ref 150–400)
RBC: 4.38 MIL/uL (ref 4.22–5.81)
RDW: 12.7 % (ref 11.5–15.5)
WBC: 4 10*3/uL (ref 4.0–10.5)

## 2018-07-19 LAB — BASIC METABOLIC PANEL
Anion gap: 11 (ref 5–15)
BUN: 15 mg/dL (ref 8–23)
CO2: 24 mmol/L (ref 22–32)
CREATININE: 1.13 mg/dL (ref 0.61–1.24)
Calcium: 8.9 mg/dL (ref 8.9–10.3)
Chloride: 106 mmol/L (ref 98–111)
GFR calc Af Amer: >60 mL/min (ref >60)
GFR calc non Af Amer: 59 mL/min — ABNORMAL LOW (ref >60)
GLUCOSE: 107 mg/dL — AB (ref 70–99)
Potassium: 3.6 mmol/L (ref 3.5–5.1)
Sodium: 141 mmol/L (ref 135–145)

## 2018-07-19 LAB — I-STAT TROPONIN, ED: Troponin i, poc: 0 ng/mL (ref 0.00–0.08)

## 2018-07-19 LAB — MAGNESIUM: Magnesium: 2 mg/dL (ref 1.7–2.4)

## 2018-07-19 MED ORDER — SODIUM CHLORIDE 0.9 % IV BOLUS
1000.0000 mL | Freq: Once | INTRAVENOUS | Status: AC
  Administered 2018-07-19: 1000 mL via INTRAVENOUS

## 2018-07-19 NOTE — ED Triage Notes (Signed)
Reports felt as he is going in and out of atrial fib - h/o same; reports weakness and dizziness; rate presently 90 (a fib) - was given ASA 324 mg and NTG x1 per EMS

## 2018-07-19 NOTE — ED Provider Notes (Signed)
[facility] EMERGENCY DEPARTMENT Provider Note   CSN: [id number] Arrival date & time: 07/19/18  1047     History   Chief Complaint Chief Complaint  Patient presents with  . Irregular Heart Beat    HPI Stanley Waters is a 83 y.o. male.     83 year old male with a history of paroxysmal A. fib presents with atrial fibrillation.  He is been having irregular heartbeat and palpitations for about 2 weeks.  Some shortness of breath with exertion as well.  On and off chest tightness but none right now.  He started feeling his heart rate was going to fast today which is why EMS was called.  Some on and off chest tightness but none now as well.  EMS gave aspirin and 1 nitro.  The patient feels okay at this moment.  He has not noticed any significant leg swelling and has not been having vomiting.  He has been having some diffuse fatigue/weakness but no unilateral weakness.  No recent change in meds or missed medicines.  Took his meds this morning.  Past Medical History:  Diagnosis Date  . Atypical chest pain    Myoview performed 07/23/11 was completely normal. Post stress EF 61%.  . Bruit    Left asymptomatic bruit. Carotid Duplex 12/13/12 =mildly abnormal. *BILATERAL BULB/PROXIMAL ICAs: Demonstrated a mild amount of fibrous plaque w/no evidence od significant diameter reduction, tortuosity or other vascular abnormality.  . Enlarged prostate    BPH - PSA of 6.7 this is consistant with his last PSA of 6.3  . Hyperlipemia   . Hypertension   . Inguinal hernia    Inguinal hernia repair (x) 2 1991, 2011  . Intestinal polyps 09/22/11   Colonoscopy removed a 2 mm sessile cecal polyp with a cold snare.  . Measles   . Mumps   . Normal coronary arteries 2002  . PAF (paroxysmal atrial fibrillation) (HCC)    Transesophageal echo on 07/07/12 - EF =50-55%. Mild atheromatous disease of the proximal descending aorta. Cadioversion 07/07/12 successful.  . Peripheral neuropathy   .  Pneumonia   . Seasonal allergies   . Skin cancer   . Thrombocytopenia ([place]) 2011   Very mild with platelets of 135,000.  [name] Tinnitus   . Umbilical hernia   . Vitamin D deficiency     Patient Active Problem List   Diagnosis Date Noted  . Essential hypertension 03/26/2016  . Normal coronary arteries 2002 06/30/2013  . Atrial fibrillation with RVR ([place]) 06/29/2013  . Chronic anticoagulation -Xarelto 06/29/2013  . Paroxysmal atrial fibrillation ([place]) 06/20/2013  . Hyperlipidemia 06/20/2013    Past Surgical History:  Procedure Laterality Date  . CARDIAC CATHETERIZATION  2002   "Normal cardiac catheterization"  . CARDIOVERSION  07/07/2012   A fib - Cardioversion successful.  [name] CARDIOVERSION N/A 06/30/2013   Procedure: CARDIOVERSION;  Surgeon: Sanda Klein, MD;  Location: [facility] OR;  Service: Cardiovascular;  Laterality: N/A;  . Carotid Duplex  12/13/12   For asymptomatic bruit. Mildly abnormal.  *BILATERAL BULB/PROXIMAL ICAs: Demonstrated a mild amount of fibrous plaque w/no evidence of significant diameter reduction, tortuosity or other vascular abnormality.   . COLONOSCOPY W/ POLYPECTOMY  09/22/11   2 mm sessile cecal polyp was removed with a cold snare.  [name] HAND SURGERY    . [place] & 2011  . TEE WITHOUT CARDIOVERSION  07/07/2012   Procedure: TRANSESOPHAGEAL ECHOCARDIOGRAM (TEE);  Surgeon: Pixie Casino, MD;  Location: [facility] ENDOSCOPY;  Service: Cardiovascular;  Laterality: N/A;        Home Medications    Prior to Admission medications   Medication Sig Start Date End Date Taking? Authorizing Provider  amiodarone (PACERONE) 200 MG tablet TAKE 1 TABLET (200 MG TOTAL) BY MOUTH DAILY. 08/24/17  Yes Lorretta Harp, MD  Cholecalciferol (VITAMIN D3) 2000 units TABS Take 2,000 Units by mouth daily.   Yes [provider]  doxazosin (CARDURA) 8 MG tablet Take 1 tablet (8 mg total) by mouth at bedtime. 10/04/13  Yes Lorretta Harp, MD  furosemide (LASIX) 20  MG tablet Take 20 mg by mouth as needed for fluid.   Yes [provider]  lisinopril (PRINIVIL,ZESTRIL) 20 MG tablet Take 20 mg by mouth daily as needed (blood pressure).    Yes [provider]  Multiple Vitamins-Minerals (ICAPS) CAPS Take 1 capsule by mouth every other day.   Yes [provider]  Multiple Vitamins-Minerals (PRESERVISION AREDS 2 PO) Take 1 tablet by mouth every other day.   Yes [provider]  OMEGA-3 KRILL OIL PO Take 1 capsule by mouth at bedtime.    Yes [provider]  psyllium (METAMUCIL) 58.6 % powder Take 1 packet by mouth as needed (constipation).    Yes [provider]  rOPINIRole (REQUIP) 0.25 MG tablet Take 0.25 mg by mouth at bedtime.  06/06/18  Yes [provider]  simvastatin (ZOCOR) 5 MG tablet Take 5 mg by mouth daily.    Yes [provider]  triamcinolone cream (KENALOG) 0.1 % Apply 1 application topically 2 (two) times daily as needed (SKIN RASHES).  01/02/14  Yes [provider]  XARELTO 20 MG TABS tablet TAKE 1 TABLET EVERY DAY WITH SUPPER Patient taking differently: Take 20 mg by mouth daily.  11/11/17  Yes Lorretta Harp, MD  hydrALAZINE (APRESOLINE) 10 MG tablet Take 1 tablet (10 mg total) by mouth as needed. If systolic blood pressure is greater than 160. Patient not taking: Reported on 07/19/2018 08/14/16   Almyra Deforest, PA    Family History Family History  Problem Relation Age of Onset  . Heart failure Brother     Social History Social History   Tobacco Use  . Smoking status: Former Smoker    Types: Cigarettes    Last attempt to quit: 11/03/1963    Years since quitting: 54.7  . Smokeless tobacco: Never Used  Substance Use Topics  . Alcohol use: No  . Drug use: No     Allergies   Prednisone and Doxycycline   Review of Systems Review of Systems  Constitutional: Positive for fatigue.  Respiratory: Positive for chest tightness and shortness of breath.     Cardiovascular: Positive for palpitations. Negative for chest pain and leg swelling.  Gastrointestinal: Negative for abdominal pain and vomiting.  Neurological: Positive for weakness.  All other systems reviewed and are negative.    Physical Exam Updated Vital Signs BP (!) 180/77 (BP Location: Right Arm)   Pulse 68   Temp (!) 97.4 F (36.3 C) (Oral)   Resp 20   SpO2 99%   Physical Exam  Constitutional: He is oriented to person, place, and time. He appears well-developed and well-nourished. No distress.  HENT:  Head: Normocephalic and atraumatic.  Right Ear: External ear normal.  Left Ear: External ear normal.  Nose: Nose normal.  Eyes: Right eye exhibits no discharge. Left eye exhibits no discharge.  Neck: Neck supple.  Cardiovascular: Normal rate and normal heart sounds.  An irregular rhythm present.  Pulmonary/Chest: Effort normal and breath sounds normal.  Abdominal: Soft. There is no tenderness.  Musculoskeletal: He exhibits no edema.  Neurological: He is alert and oriented to person, place, and time.  Skin: Skin is warm and dry. He is not diaphoretic.  Nursing note and vitals reviewed.    ED Treatments / Results  Labs (all labs ordered are listed, but only abnormal results are displayed) Labs Reviewed  CBC - Abnormal; Notable for the following components:      Result Value   Platelets 114 (*)    All other components within normal limits  BASIC METABOLIC PANEL - Abnormal; Notable for the following components:   Glucose, Bld 107 (*)    GFR calc non Af Amer 59 (*)    All other components within normal limits  MAGNESIUM  I-STAT TROPONIN, ED    EKG EKG Interpretation  Date/Time:  Tuesday July 19 2018 11:54:26 EDT Ventricular Rate:  75 PR Interval:    QRS Duration: 114 QT Interval:  477 QTC Calculation: 461 R Axis:   36 Text Interpretation:  Atrial flutter with varied AV block, Borderline intraventricular conduction delay nonspecific ST/T changes  Confirmed by Sherwood Gambler [phone]) on 07/19/2018 11:56:59 AM Also confirmed by Sherwood Gambler [phone]), editor Philomena Doheny [phone])  on 07/19/2018 12:55:51 PM   Radiology Dg Chest 2 View  Result Date: 07/19/2018 CLINICAL DATA:  Weakness and fatigue for the past 2 weeks. History of atrial fibrillation. Remote history of smoking. EXAM: CHEST - 2 VIEW COMPARISON:  PA and lateral chest x-ray of March 01, 2018 FINDINGS: The lungs are adequately inflated. The interstitial markings are coarse though stable. There is no alveolar infiltrate or pleural effusion. The heart is top-normal in size. The pulmonary vascularity is not engorged. There is calcification in the wall of the thoracic aorta. The bony thorax exhibits no acute abnormality. IMPRESSION: Chronic bronchitic changes. No acute pneumonia nor CHF. Top-normal cardiac size. Thoracic aortic atherosclerosis. Electronically Signed   By: David  Martinique M.D.   On: 07/19/2018 12:20    Procedures Procedures (including critical care time)  Medications Ordered in ED Medications  sodium chloride 0.9 % bolus 1,000 mL (0 mLs Intravenous Stopped 07/19/18 1427)     Initial Impression / Assessment and Plan / ED Course  I have reviewed the triage vital signs and the nursing notes.  Pertinent labs & imaging results that were available during my care of the patient were reviewed by me and considered in my medical decision making (see chart for details).     The patient appears to be having symptomatic A. fib although he is been having the symptoms for about 2 weeks.  He is noted to be in A. fib here but with a regular rate.  He is currently anticoagulated which is appropriate based on his past medical history.  He does have a little bit of shortness of breath on exertion but again this is been going on for a couple weeks.  Troponin is negative.  ECG without acute ischemic changes.  At this point, I do not think he needs to be emergently brought into the hospital  or emergently cardioverted but he can follow-up with his cardiologist for further outpatient management of the A. fib and they can decide on rate control, which she is currently on, versus rhythm control.  Follow-up with his cardiologist, Dr. [name]. No obvious electrolyte abnormalities.   CHA2DS2/VAS Stroke Risk Points  Current as of 7 minutes ago  3 >= 2 Points: High Risk  1 - 1.99 Points: Medium Risk  0 Points: Low Risk    This is the only CHA2DS2/VAS Stroke Risk Points available for the past  year.:  Last Change: N/A     Details    This score determines the patient's risk of having a stroke if the  patient has atrial fibrillation.       Points Metrics  0 Has Congestive Heart Failure:  No    Current as of 7 minutes ago  0 Has Vascular Disease:  No    Current as of 7 minutes ago  1 Has Hypertension:  Yes    Current as of 7 minutes ago  2 Age:  39    Current as of 7 minutes ago  0 Has Diabetes:  No    Current as of 7 minutes ago  0 Had Stroke:  No  Had TIA:  No  Had thromboembolism:  No    Current as of 7 minutes ago  0 Male:  No    Current as of 7 minutes ago            Final Clinical Impressions(s) / ED Diagnoses   Final diagnoses:  Atrial fibrillation, unspecified type [facility])    ED Discharge Orders    None       Sherwood Gambler, MD 07/19/18 1603

## 2018-07-19 NOTE — ED Notes (Signed)
Ambulated pt without event - o2 sat remains 96-97 during ambulation, HR remained 83-104 (atrial fib); denies pain, dyspnea nor other symptoms during nor after ambulation

## 2018-07-19 NOTE — ED Notes (Signed)
EKG done

## 2018-07-19 NOTE — Discharge Instructions (Signed)
If you experience severe palpitations, dizziness, lightheadedness, weakness, severe headache, chest pain, shortness of breath, or any other new/concerning symptoms and return to the ER for evaluation.  Otherwise follow-up with Dr. [name].

## 2018-07-20 ENCOUNTER — Encounter: Payer: Self-pay | Admitting: Cardiovascular Disease

## 2018-07-20 ENCOUNTER — Ambulatory Visit: Payer: Medicare HMO | Admitting: Cardiovascular Disease

## 2018-07-20 VITALS — BP 142/70 | HR 54 | Ht 72.0 in | Wt 200.0 lb

## 2018-07-20 DIAGNOSIS — I480 Paroxysmal atrial fibrillation: Secondary | ICD-10-CM | POA: Diagnosis not present

## 2018-07-20 DIAGNOSIS — I4891 Unspecified atrial fibrillation: Secondary | ICD-10-CM | POA: Diagnosis not present

## 2018-07-20 NOTE — Progress Notes (Signed)
I saw Mr. Capell approximately a week ago.  He is on Xarelto for PAF.  He was seen in the ER again yesterday for PAF and converted to sinus rhythm last night.  He is symptomatic from this and it appears the frequency and severity of the symptoms have become progressive.  Based on this, I am going to refer him to Dr. Rayann Heman for consideration of A. fib ablation.  He is already on amiodarone.  Lorretta Harp, M.D., [place], [facility], [place] [address] [place], [place]  [address]  [phone] 07/20/2018 10:34 AM

## 2018-07-20 NOTE — Patient Instructions (Signed)
Medication Instructions:  Your physician recommends that you continue on your current medications as directed. Please refer to the Current Medication list given to you today.   Labwork: none  Testing/Procedures: none  Follow-Up: You have been referred to EP - Dr. Thompson Grayer - for possible A.Fib Ablation   We request that you follow-up in: 3 months  with an extender and in 12 months with Dr Andria Rhein will receive a reminder letter in the mail two months in advance. If you don't receive a letter, please call our office to schedule the follow-up appointment.    Any Other Special Instructions Will Be Listed Below (If Applicable).     If you need a refill on your cardiac medications before your next appointment, please call your pharmacy.

## 2018-07-20 NOTE — Assessment & Plan Note (Signed)
I saw Mr. Blixt approximately a week ago.  He is on Xarelto for PAF.  He was seen in the ER again yesterday for PAF and converted to sinus rhythm last night.  He is symptomatic from this and it appears the frequency and severity of the symptoms have become progressive.  Based on this, I am going to refer him to Dr. Rayann Heman for consideration of A. fib ablation.  He is already on amiodarone.

## 2018-07-25 ENCOUNTER — Encounter: Payer: Self-pay | Admitting: Internal Medicine

## 2018-07-25 ENCOUNTER — Ambulatory Visit: Payer: Medicare HMO | Admitting: Internal Medicine

## 2018-07-25 VITALS — BP 146/84 | HR 67 | Ht 72.0 in | Wt 196.2 lb

## 2018-07-25 DIAGNOSIS — Z23 Encounter for immunization: Secondary | ICD-10-CM

## 2018-07-25 DIAGNOSIS — I480 Paroxysmal atrial fibrillation: Secondary | ICD-10-CM | POA: Diagnosis not present

## 2018-07-25 MED ORDER — AMIODARONE HCL 200 MG PO TABS: 200.0000 mg | ORAL_TABLET | Freq: Every day | ORAL | 3 refills | Status: DC

## 2018-07-25 MED ORDER — AMIODARONE HCL 200 MG PO TABS: 200.0000 mg | ORAL_TABLET | Freq: Two times a day (BID) | ORAL | 0 refills | Status: DC

## 2018-07-25 NOTE — Progress Notes (Signed)
Electrophysiology Office Note   Date:  07/25/2018   ID:  Stanley Waters, DOB Feb 14, 1935, MRN [id number]  PCP:  Deland Pretty, MD  Cardiologist:  Dr [name] Primary Electrophysiologist: Thompson Grayer, MD    CC: atrial fibrillation   History of Present Illness: Stanley Waters is a 83 y.o. male who presents today for electrophysiology evaluation.   He is referred by Dr [name] for EP consultation regarding atrial fibrillation.  He reports having atrial fibrillation for about 15 years.  He has required prior cardioversions.  He has maintained sinus rhythm over the past 5 years with amiodarone.  He reports having recurrence of afib about 3 weeks ago.  He has symptoms of fatigue and decreased exercise tolerance with afib.  During exercise, he has tachypalpitations which limit activity.   Today, he denies symptoms of chest pain,  orthopnea, PND, lower extremity edema, claudication, dizziness, presyncope, syncope, bleeding, or neurologic sequela. The patient is tolerating medications without difficulties and is otherwise without complaint today.    Past Medical History:  Diagnosis Date  . Atypical chest pain    Myoview performed 07/23/11 was completely normal. Post stress EF 61%.  . Bruit    Left asymptomatic bruit. Carotid Duplex 12/13/12 =mildly abnormal. *BILATERAL BULB/PROXIMAL ICAs: Demonstrated a mild amount of fibrous plaque w/no evidence od significant diameter reduction, tortuosity or other vascular abnormality.  . Enlarged prostate    BPH - PSA of 6.7 this is consistant with his last PSA of 6.3  . Hyperlipemia   . Hypertension   . Inguinal hernia    Inguinal hernia repair (x) 2 1991, 2011  . Intestinal polyps 09/22/11   Colonoscopy removed a 2 mm sessile cecal polyp with a cold snare.  . Measles   . Mumps   . Normal coronary arteries 2002  . PAF (paroxysmal atrial fibrillation) (HCC)    Transesophageal echo on 07/07/12 - EF =50-55%. Mild atheromatous disease of the proximal  descending aorta. Cadioversion 07/07/12 successful.  . Peripheral neuropathy   . Pneumonia   . Seasonal allergies   . Skin cancer   . Thrombocytopenia ([place]) 2011   Very mild with platelets of 135,000.  [name] Tinnitus   . Umbilical hernia   . Vitamin D deficiency    Past Surgical History:  Procedure Laterality Date  . CARDIAC CATHETERIZATION  2002   "Normal cardiac catheterization"  . CARDIOVERSION  07/07/2012   A fib - Cardioversion successful.  [name] CARDIOVERSION N/A 06/30/2013   Procedure: CARDIOVERSION;  Surgeon: Sanda Klein, MD;  Location: [facility] OR;  Service: Cardiovascular;  Laterality: N/A;  . Carotid Duplex  12/13/12   For asymptomatic bruit. Mildly abnormal.  *BILATERAL BULB/PROXIMAL ICAs: Demonstrated a mild amount of fibrous plaque w/no evidence of significant diameter reduction, tortuosity or other vascular abnormality.   . COLONOSCOPY W/ POLYPECTOMY  09/22/11   2 mm sessile cecal polyp was removed with a cold snare.  [name] HAND SURGERY    . [place] & 2011  . TEE WITHOUT CARDIOVERSION  07/07/2012   Procedure: TRANSESOPHAGEAL ECHOCARDIOGRAM (TEE);  Surgeon: Pixie Casino, MD;  Location: [facility] ENDOSCOPY;  Service: Cardiovascular;  Laterality: N/A;     Current Outpatient Medications  Medication Sig Dispense Refill  . amiodarone (PACERONE) 200 MG tablet TAKE 1 TABLET (200 MG TOTAL) BY MOUTH DAILY. 90 tablet 3  . Cholecalciferol (VITAMIN D3) 2000 units TABS Take 2,000 Units by mouth daily.    [name] doxazosin (CARDURA) 8 MG tablet Take 1 tablet (  8 mg total) by mouth at bedtime. 90 tablet 3  . furosemide (LASIX) 20 MG tablet Take 20 mg by mouth as needed for fluid.    . hydrALAZINE (APRESOLINE) 10 MG tablet Take 1 tablet (10 mg total) by mouth as needed. If systolic blood pressure is greater than 160. 30 tablet 3  . lisinopril (PRINIVIL,ZESTRIL) 20 MG tablet Take 20 mg by mouth daily as needed (blood pressure).     . Multiple Vitamins-Minerals (ICAPS) CAPS Take 1 capsule by  mouth every other day.    . Multiple Vitamins-Minerals (PRESERVISION AREDS 2 PO) Take 1 tablet by mouth every other day.    . OMEGA-3 KRILL OIL PO Take 1 capsule by mouth at bedtime.     . psyllium (METAMUCIL) 58.6 % powder Take 1 packet by mouth as needed (constipation).     [name] rOPINIRole (REQUIP) 0.25 MG tablet Take 0.25 mg by mouth at bedtime.     . simvastatin (ZOCOR) 5 MG tablet Take 5 mg by mouth daily.     [name] triamcinolone cream (KENALOG) 0.1 % Apply 1 application topically 2 (two) times daily as needed (SKIN RASHES).     [name] 20 MG TABS tablet TAKE 1 TABLET EVERY DAY WITH SUPPER (Patient taking differently: Take 20 mg by mouth daily. ) 90 tablet 1   No current facility-administered medications for this visit.     Allergies:   Prednisone and Doxycycline   Social History:  The patient  reports that he quit smoking about 54 years ago. His smoking use included cigarettes. He has never used smokeless tobacco. He reports that he does not drink alcohol or use drugs.   Family History:  The patient's  family history includes Heart failure in his brother.    ROS:  Please see the history of present illness.   All other systems are personally reviewed and negative.    PHYSICAL EXAM: VS:  BP (!) 146/84   Pulse 67   Ht 6' (1.829 m)   Wt 196 lb 3.2 oz (89 kg)   SpO2 97%   BMI 26.61 kg/m  , BMI Body mass index is 26.61 kg/m. GEN: Well nourished, well developed, in no acute distress  HEENT: normal  Neck: no JVD, carotid bruits, or masses Cardiac: iRRR; no murmurs, rubs, or gallops,no edema  Respiratory:  clear to auscultation bilaterally, normal work of breathing GI: soft, nontender, nondistended, + BS MS: no deformity or atrophy  Skin: warm and dry  Neuro:  Strength and sensation are intact Psych: euthymic mood, full affect  EKG:  EKG is ordered today. The ekg ordered today is personally reviewed and shows atrial flutter, V rate 67 bpm, Qtc 511 msec   Recent Labs: 07/13/2018:  ALT 10; TSH 3.190 07/19/2018: BUN 15; Creatinine, Ser 1.13; Hemoglobin 14.3; Magnesium 2.0; Platelets 114; Potassium 3.6; Sodium 141  personally reviewed   Lipid Panel  No results found for: CHOL, TRIG, HDL, CHOLHDL, VLDL, LDLCALC, LDLDIRECT personally reviewed   Wt Readings from Last 3 Encounters:  07/25/18 196 lb 3.2 oz (89 kg)  07/20/18 200 lb (90.7 kg)  07/13/18 199 lb 9.6 oz (90.5 kg)      Other studies personally reviewed: Additional studies/ records that were reviewed today include: Dr Kennon Holter notes prior TEE  Review of the above records today demonstrates: as above   ASSESSMENT AND PLAN:  1.  Persistent afib/ typical atrial flutter The patient has symptomatic atrial arrhythmias He reports missing a dose of xarelto 07/18/18.  He does not think that he could wait 3 weeks prior to cardioversion.  He therefore requests tee guided cardioversion. Increase amiodarone to 200mg  BID x 2 weeks then resume current therapy. Follow-up in AF clinic post cardioversion.  2D echo to evaluate LA size which would be helpful to determine long term management.  As this is his first episode of afib in 5 years, It may be best to see how he does post cardioversion.  If he remains in sinus rhythm, then continued amiodarone would be reasonable.  Given his advanced age, I would reserve ablation for more frequent episodes of afib.  Importance of compliance with xarelto without interruption was discussed at length today.   Follow-up:  AF clinic after cardioversion  Current medicines are reviewed at length with the patient today.   The patient does not have concerns regarding his medicines.  The following changes were made today:  none  Labs/ tests ordered today include:  Orders Placed This Encounter  Procedures  . Flu vaccine HIGH DOSE PF  . EKG 12-Lead     Signed, Thompson Grayer, MD  07/25/2018 4:31 PM     [place]  [address] [phone] (office) [phone] (fax)

## 2018-07-25 NOTE — Patient Instructions (Addendum)
Medication Instructions:  Your physician has recommended you make the following change in your medication:  1.  Increase your amiodarone 200 mg-  Take 1 tablet by mouth twice a day for 14 days until 08/08/2018  On 08/09/2018  go back to one tablet Amiodarone 200 mg daily  Do not miss ANY XARELTO   Labwork: None ordered.  Testing/Procedures: Your physician has requested that you have a TEE/Cardioversion. During a TEE, sound waves are used to create images of your heart. It provides your doctor with information about the size and shape of your heart and how well your heart's chambers and valves are working. In this test, a transducer is attached to the end of a flexible tube that is guided down you throat and into your esophagus (the tube leading from your mouth to your stomach) to get a more detailed image of your heart. Once the TEE has determined that a blood clot is not present, the cardioversion begins. Electrical Cardioversion uses a jolt of electricity to your heart either through paddles or wired patches attached to your chest. This is a controlled, usually prescheduled, procedure. This procedure is done at the hospital and you are not awake during the procedure. You usually go home the day of the procedure. Please see the instruction sheet given to you today for more information.  Follow-Up: Your physician wants you to follow-up in: one week with Stanley Palau NP at the afib clinic.  Any Other Special Instructions Will Be Listed Below (If Applicable).  If you need a refill on your cardiac medications before your next appointment, please call your pharmacy.     Dear Stanley Waters, You are scheduled for a TEE Cardioversion on August 02, 2018 with Dr. Stanford Breed.  Please arrive at the [facility] (Main Entrance A) at [facility]: [address] [place], [place] [address] at 11:15 am.  Procedure will be at 12:45 pm DIET: Nothing to eat or drink after midnight except a sip of water with  medications (see medication instructions below)  Medication Instructions: Take all your normal morning medications-except LASIX.   Labs: your lab work will be done at the hospital prior to your procedure - you will need to arrive 1  hours ahead of your procedure  You must have a responsible person to drive you home and stay in the waiting area during your procedure. Failure to do so could result in cancellation.  Bring your insurance cards.  *Special Note: Every effort is made to have your procedure done on time. Occasionally there are emergencies that occur at the hospital that may cause delays. Please be patient if a delay does occur.

## 2018-07-29 ENCOUNTER — Institutional Professional Consult (permissible substitution): Payer: Medicare HMO | Admitting: Internal Medicine

## 2018-08-02 ENCOUNTER — Ambulatory Visit (HOSPITAL_BASED_OUTPATIENT_CLINIC_OR_DEPARTMENT_OTHER)
Admission: RE | Admit: 2018-08-02 | Discharge: 2018-08-02 | Disposition: A | Discharge status: Home / Self Care | Payer: Medicare HMO | Source: Ambulatory Visit | Attending: Cardiology | Admitting: Cardiology

## 2018-08-02 ENCOUNTER — Ambulatory Visit (HOSPITAL_COMMUNITY): Payer: Medicare HMO | Admitting: Anesthesiology

## 2018-08-02 ENCOUNTER — Encounter (HOSPITAL_COMMUNITY)
Admission: RE | Disposition: A | Discharge status: Home / Self Care | Payer: Self-pay | Source: Ambulatory Visit | Attending: Cardiology

## 2018-08-02 ENCOUNTER — Encounter (HOSPITAL_COMMUNITY): Payer: Self-pay | Admitting: *Deleted

## 2018-08-02 ENCOUNTER — Other Ambulatory Visit: Payer: Self-pay

## 2018-08-02 ENCOUNTER — Ambulatory Visit (HOSPITAL_COMMUNITY)
Admission: RE | Admit: 2018-08-02 | Discharge: 2018-08-02 | Disposition: A | Discharge status: Home / Self Care | Payer: Medicare HMO | Source: Ambulatory Visit | Attending: Cardiology | Admitting: Cardiology

## 2018-08-02 DIAGNOSIS — G629 Polyneuropathy, unspecified: Secondary | ICD-10-CM | POA: Insufficient documentation

## 2018-08-02 DIAGNOSIS — I4891 Unspecified atrial fibrillation: Secondary | ICD-10-CM

## 2018-08-02 DIAGNOSIS — E785 Hyperlipidemia, unspecified: Secondary | ICD-10-CM | POA: Insufficient documentation

## 2018-08-02 DIAGNOSIS — Z87891 Personal history of nicotine dependence: Secondary | ICD-10-CM | POA: Insufficient documentation

## 2018-08-02 DIAGNOSIS — Z8249 Family history of ischemic heart disease and other diseases of the circulatory system: Secondary | ICD-10-CM | POA: Diagnosis not present

## 2018-08-02 DIAGNOSIS — I4819 Other persistent atrial fibrillation: Secondary | ICD-10-CM | POA: Diagnosis not present

## 2018-08-02 DIAGNOSIS — I480 Paroxysmal atrial fibrillation: Secondary | ICD-10-CM | POA: Diagnosis not present

## 2018-08-02 DIAGNOSIS — I340 Nonrheumatic mitral (valve) insufficiency: Secondary | ICD-10-CM

## 2018-08-02 DIAGNOSIS — Z7901 Long term (current) use of anticoagulants: Secondary | ICD-10-CM | POA: Diagnosis not present

## 2018-08-02 DIAGNOSIS — I483 Typical atrial flutter: Secondary | ICD-10-CM | POA: Diagnosis not present

## 2018-08-02 DIAGNOSIS — I10 Essential (primary) hypertension: Secondary | ICD-10-CM | POA: Insufficient documentation

## 2018-08-02 DIAGNOSIS — N400 Enlarged prostate without lower urinary tract symptoms: Secondary | ICD-10-CM | POA: Insufficient documentation

## 2018-08-02 HISTORY — PX: TEE WITHOUT CARDIOVERSION: SHX5443

## 2018-08-02 HISTORY — PX: CARDIOVERSION: SHX1299

## 2018-08-02 LAB — POCT I-STAT 4, (NA,K, GLUC, HGB,HCT)
Glucose, Bld: 98 mg/dL (ref 70–99)
HCT: 40 % (ref 39.0–52.0)
Hemoglobin: 13.6 g/dL (ref 13.0–17.0)
POTASSIUM: 3.6 mmol/L (ref 3.5–5.1)
Sodium: 141 mmol/L (ref 135–145)

## 2018-08-02 SURGERY — ECHOCARDIOGRAM, TRANSESOPHAGEAL
Anesthesia: General

## 2018-08-02 MED ORDER — SODIUM CHLORIDE 0.9 % IV SOLN
INTRAVENOUS | Status: DC | PRN
  Administered 2018-08-02: 13:00:00 via INTRAVENOUS

## 2018-08-02 MED ORDER — PROPOFOL 10 MG/ML IV BOLUS
INTRAVENOUS | Status: DC | PRN
  Administered 2018-08-02: 40 mg via INTRAVENOUS
  Administered 2018-08-02: 30 mg via INTRAVENOUS
  Administered 2018-08-02 (×2): 20 mg via INTRAVENOUS

## 2018-08-02 MED ORDER — BUTAMBEN-TETRACAINE-BENZOCAINE 2-2-14 % EX AERO
INHALATION_SPRAY | CUTANEOUS | Status: DC | PRN
  Administered 2018-08-02: 2 via TOPICAL

## 2018-08-02 MED ORDER — LIDOCAINE 2% (20 MG/ML) 5 ML SYRINGE
INTRAMUSCULAR | Status: DC | PRN
  Administered 2018-08-02: 40 mg via INTRAVENOUS

## 2018-08-02 MED ORDER — PROPOFOL 500 MG/50ML IV EMUL
INTRAVENOUS | Status: DC | PRN
  Administered 2018-08-02: 50 ug/kg/min via INTRAVENOUS

## 2018-08-02 MED ORDER — SODIUM CHLORIDE 0.9 % IV SOLN: INTRAVENOUS | Status: DC

## 2018-08-02 NOTE — Discharge Instructions (Signed)
TEE ° °YOU HAD AN CARDIAC PROCEDURE TODAY: Refer to the procedure report and other information in the discharge instructions given to you for any specific questions about what was found during the examination. If this information does not answer your questions, please call [facility] office at [phone] to clarify.  ° °DIET: Your first meal following the procedure should be a light meal and then it is ok to progress to your normal diet. A half-sandwich or bowl of soup is an example of a good first meal. Heavy or fried foods are harder to digest and may make you feel nauseous or bloated. Drink plenty of fluids but you should avoid alcoholic beverages for 24 hours. If you had a esophageal dilation, please see attached instructions for diet.  ° °ACTIVITY: Your care partner should take you home directly after the procedure. You should plan to take it easy, moving slowly for the rest of the day. You can resume normal activity the day after the procedure however YOU SHOULD NOT DRIVE, use power tools, machinery or perform tasks that involve climbing or major physical exertion for 24 hours (because of the sedation medicines used during the test).  ° °SYMPTOMS TO REPORT IMMEDIATELY: °A cardiologist can be reached at any hour. Please call [phone] for any of the following symptoms:  °Vomiting of blood or coffee ground material  °New, significant abdominal pain  °New, significant chest pain or pain under the shoulder blades  °Painful or persistently difficult swallowing  °New shortness of breath  °Black, tarry-looking or red, bloody stools ° °FOLLOW UP:  °Please also call with any specific questions about appointments or follow up tests. ° °Electrical Cardioversion, Care After °This sheet gives you information about how to care for yourself after your procedure. Your health care provider may also give you more specific instructions. If you have problems or questions, contact your health care provider. °What can I  expect after the procedure? °After the procedure, it is common to have: °· Some redness on the skin where the shocks were given. ° °Follow these instructions at home: °· Do not drive for 24 hours if you were given a medicine to help you relax (sedative). °· Take over-the-counter and prescription medicines only as told by your health care provider. °· Ask your health care provider how to check your pulse. Check it often. °· Rest for 48 hours after the procedure or as told by your health care provider. °· Avoid or limit your caffeine use as told by your health care provider. °Contact a health care provider if: °· You feel like your heart is beating too quickly or your pulse is not regular. °· You have a serious muscle cramp that does not go away. °Get help right away if: °· You have discomfort in your chest. °· You are dizzy or you feel faint. °· You have trouble breathing or you are short of breath. °· Your speech is slurred. °· You have trouble moving an arm or leg on one side of your body. °· Your fingers or toes turn cold or blue. °This information is not intended to replace advice given to you by your health care provider. Make sure you discuss any questions you have with your health care provider. °Document Released: 08/09/2013 Document Revised: 05/22/2016 Document Reviewed: 04/24/2016 °Elsevier Interactive Patient Education © 2018 [employer] ° °

## 2018-08-02 NOTE — Interval H&P Note (Signed)
History and Physical Interval Note:  08/02/2018 12:21 PM  Stanley Waters  has presented today for surgery, with the diagnosis of afib  The various methods of treatment have been discussed with the patient and family. After consideration of risks, benefits and other options for treatment, the patient has consented to  Procedure(s): TRANSESOPHAGEAL ECHOCARDIOGRAM (TEE) (N/A) CARDIOVERSION (N/A) as a surgical intervention .  The patient's history has been reviewed, patient examined, no change in status, stable for surgery.  I have reviewed the patient's chart and labs.  Questions were answered to the patient's satisfaction.     Kirk Ruths

## 2018-08-02 NOTE — Anesthesia Postprocedure Evaluation (Signed)
Anesthesia Post Note  Patient: Stanley Waters  Procedure(s) Performed: TRANSESOPHAGEAL ECHOCARDIOGRAM (TEE) (N/A ) CARDIOVERSION (N/A )     Patient location during evaluation: PACU Anesthesia Type: General Level of consciousness: awake and alert and oriented Pain management: pain level controlled Vital Signs Assessment: post-procedure vital signs reviewed and stable Respiratory status: spontaneous breathing, nonlabored ventilation and respiratory function stable Cardiovascular status: blood pressure returned to baseline and stable Postop Assessment: no apparent nausea or vomiting Anesthetic complications: no    Last Vitals:  Vitals:   08/02/18 1143 08/02/18 1330  BP:  (!) 149/72  Resp: 18 13  Temp: 36.9 C   SpO2:  98%    Last Pain:  Vitals:   08/02/18 1330  TempSrc:   PainSc: 0-No pain                 Jocelynne Duquette A.

## 2018-08-02 NOTE — Progress Notes (Signed)
    Transesophageal Echocardiogram Note  Stanley Waters [id number] 24-Nov-1934  Procedure: Transesophageal Echocardiogram/DCCV Indications: atrial fibrillation  Procedure Details Consent: Obtained Time Out: Verified patient identification, verified procedure, site/side was marked, verified correct patient position, special equipment/implants available, Radiology Safety Procedures followed,  medications/allergies/relevent history reviewed, required imaging and test results available.  Performed  Medications:  Pt sedated by anesthesia with lidocaine 40 mg and diprovan 260 mg IV total.  Normal LV function; no LAA thrombus  Pt subsequently underwent DCCV with 120 J to atrial flutter; then second attempt with 150 J to  sinus bradycardia; no immediate complications; continue preadmission meds including xarelto.   Complications: No apparent complications Patient did tolerate procedure well.  Kirk Ruths, MD

## 2018-08-02 NOTE — Anesthesia Procedure Notes (Signed)
Procedure Name: MAC Date/Time: 08/02/2018 12:52 PM Performed by: Renato Shin, CRNA Pre-anesthesia Checklist: Patient identified, Emergency Drugs available, Suction available and Patient being monitored Patient Re-evaluated:Patient Re-evaluated prior to induction Oxygen Delivery Method: Circle system utilized Preoxygenation: Pre-oxygenation with 100% oxygen Induction Type: IV induction Placement Confirmation: positive ETCO2,  CO2 detector and breath sounds checked- equal and bilateral Dental Injury: Teeth and Oropharynx as per pre-operative assessment

## 2018-08-02 NOTE — H&P (Signed)
Office Visit   07/25/2018 [place]    Thompson Grayer, MD  Cardiology   Paroxysmal atrial fibrillation [facility]) +1 more  Dx   Referred by Deland Pretty, MD  Reason for Visit   Additional Documentation   Vitals:   BP 146/84    Pulse 67   Ht 6' (1.829 m)   Wt 89 kg   SpO2 97%   BMI 26.61 kg/m   BSA 2.13 m   Flowsheets:   MEWS Score,   Anthropometrics     Encounter Info:   Billing Info,   History,   Allergies,   Detailed Report     All Notes   Procedures by Thompson Grayer, MD at 07/25/2018 4:00 PM  Author: Thompson Grayer, MD Author Type: Physician Filed: 07/26/2018 11:05 AM  Note Status: Signed Cosign: Cosign Not Required Encounter Date: 07/25/2018  Editor: Sallee Provencal L  Procedure Orders:  1. EKG 12-Lead [[id number]] ordered by Thompson Grayer, MD at 07/25/18 1616         Scan on 07/26/2018 11:05 AM by Sallee Provencal L: Ekg - [facility] Scan on 07/26/2018 11:05 AM by Sallee Provencal L: Ekg - [facility]   Progress Notes by Thompson Grayer, MD at 07/25/2018 4:00 PM  Author: Thompson Grayer, MD Author Type: Physician Filed: 07/25/2018 5:16 PM  Note Status: Signed Cosign: Cosign Not Required Encounter Date: 07/25/2018  Editor: Thompson Grayer, MD (Physician)  Expand All Collapse All      Electrophysiology Office Note   Date:  07/25/2018   ID:  Stanley Waters, DOB 1935/04/14, MRN [id number]  PCP:  Deland Pretty, MD         Cardiologist:  Dr [name] Primary Electrophysiologist: Thompson Grayer, MD       CC: atrial fibrillation   History of Present Illness: Stanley Waters is a 83 y.o. male who presents today for electrophysiology evaluation.   He is referred by Dr [name] for EP consultation regarding atrial fibrillation.  He reports having atrial fibrillation for about 15 years.  He has required prior cardioversions.  He has maintained sinus rhythm over the past 5 years with amiodarone.  He reports having recurrence of afib about 3  weeks ago.  He has symptoms of fatigue and decreased exercise tolerance with afib.  During exercise, he has tachypalpitations which limit activity.   Today, he denies symptoms of chest pain,  orthopnea, PND, lower extremity edema, claudication, dizziness, presyncope, syncope, bleeding, or neurologic sequela. The patient is tolerating medications without difficulties and is otherwise without complaint today.        Past Medical History:  Diagnosis Date  . Atypical chest pain    Myoview performed 07/23/11 was completely normal. Post stress EF 61%.  . Bruit    Left asymptomatic bruit. Carotid Duplex 12/13/12 =mildly abnormal. *BILATERAL BULB/PROXIMAL ICAs: Demonstrated a mild amount of fibrous plaque w/no evidence od significant diameter reduction, tortuosity or other vascular abnormality.  . Enlarged prostate    BPH - PSA of 6.7 this is consistant with his last PSA of 6.3  . Hyperlipemia   . Hypertension   . Inguinal hernia    Inguinal hernia repair (x) 2 1991, 2011  . Intestinal polyps 09/22/11   Colonoscopy removed a 2 mm sessile cecal polyp with a cold snare.  . Measles   . Mumps   . Normal coronary arteries 2002  . PAF (paroxysmal atrial fibrillation) (HCC)    Transesophageal echo on 07/07/12 - EF =50-55%. Mild atheromatous  disease of the proximal descending aorta. Cadioversion 07/07/12 successful.  . Peripheral neuropathy   . Pneumonia   . Seasonal allergies   . Skin cancer   . Thrombocytopenia ([place]) 2011   Very mild with platelets of 135,000.  [name] Tinnitus   . Umbilical hernia   . Vitamin D deficiency         Past Surgical History:  Procedure Laterality Date  . CARDIAC CATHETERIZATION  2002   "Normal cardiac catheterization"  . CARDIOVERSION  07/07/2012   A fib - Cardioversion successful.  [name] CARDIOVERSION N/A 06/30/2013   Procedure: CARDIOVERSION;  Surgeon: Sanda Klein, MD;  Location: [facility] OR;  Service: Cardiovascular;  Laterality: N/A;  .  Carotid Duplex  12/13/12   For asymptomatic bruit. Mildly abnormal.  *BILATERAL BULB/PROXIMAL ICAs: Demonstrated a mild amount of fibrous plaque w/no evidence of significant diameter reduction, tortuosity or other vascular abnormality.   . COLONOSCOPY W/ POLYPECTOMY  09/22/11   2 mm sessile cecal polyp was removed with a cold snare.  [name] HAND SURGERY    . [place] & 2011  . TEE WITHOUT CARDIOVERSION  07/07/2012   Procedure: TRANSESOPHAGEAL ECHOCARDIOGRAM (TEE);  Surgeon: Pixie Casino, MD;  Location: [facility] ENDOSCOPY;  Service: Cardiovascular;  Laterality: N/A;           Current Outpatient Medications  Medication Sig Dispense Refill  . amiodarone (PACERONE) 200 MG tablet TAKE 1 TABLET (200 MG TOTAL) BY MOUTH DAILY. 90 tablet 3  . Cholecalciferol (VITAMIN D3) 2000 units TABS Take 2,000 Units by mouth daily.    [name] doxazosin (CARDURA) 8 MG tablet Take 1 tablet (8 mg total) by mouth at bedtime. 90 tablet 3  . furosemide (LASIX) 20 MG tablet Take 20 mg by mouth as needed for fluid.    . hydrALAZINE (APRESOLINE) 10 MG tablet Take 1 tablet (10 mg total) by mouth as needed. If systolic blood pressure is greater than 160. 30 tablet 3  . lisinopril (PRINIVIL,ZESTRIL) 20 MG tablet Take 20 mg by mouth daily as needed (blood pressure).     . Multiple Vitamins-Minerals (ICAPS) CAPS Take 1 capsule by mouth every other day.    . Multiple Vitamins-Minerals (PRESERVISION AREDS 2 PO) Take 1 tablet by mouth every other day.    . OMEGA-3 KRILL OIL PO Take 1 capsule by mouth at bedtime.     . psyllium (METAMUCIL) 58.6 % powder Take 1 packet by mouth as needed (constipation).     [name] rOPINIRole (REQUIP) 0.25 MG tablet Take 0.25 mg by mouth at bedtime.     . simvastatin (ZOCOR) 5 MG tablet Take 5 mg by mouth daily.     [name] triamcinolone cream (KENALOG) 0.1 % Apply 1 application topically 2 (two) times daily as needed (SKIN RASHES).     [name] 20 MG TABS tablet TAKE 1  TABLET EVERY DAY WITH SUPPER (Patient taking differently: Take 20 mg by mouth daily. ) 90 tablet 1   No current facility-administered medications for this visit.     Allergies:   Prednisone and Doxycycline   Social History:  The patient  reports that he quit smoking about 54 years ago. His smoking use included cigarettes. He has never used smokeless tobacco. He reports that he does not drink alcohol or use drugs.   Family History:  The patient's  family history includes Heart failure in his brother.    ROS:  Please see the history of present illness.   All other systems are  personally reviewed and negative.    PHYSICAL EXAM: VS:  BP (!) 146/84   Pulse 67   Ht 6' (1.829 m)   Wt 196 lb 3.2 oz (89 kg)   SpO2 97%   BMI 26.61 kg/m  , BMI Body mass index is 26.61 kg/m. GEN: Well nourished, well developed, in no acute distress  HEENT: normal  Neck: no JVD, carotid bruits, or masses Cardiac: iRRR; no murmurs, rubs, or gallops,no edema  Respiratory:  clear to auscultation bilaterally, normal work of breathing GI: soft, nontender, nondistended, + BS MS: no deformity or atrophy  Skin: warm and dry  Neuro:  Strength and sensation are intact Psych: euthymic mood, full affect  EKG:  EKG is ordered today. The ekg ordered today is personally reviewed and shows atrial flutter, V rate 67 bpm, Qtc 511 msec   Recent Labs: 07/13/2018: ALT 10; TSH 3.190 07/19/2018: BUN 15; Creatinine, Ser 1.13; Hemoglobin 14.3; Magnesium 2.0; Platelets 114; Potassium 3.6; Sodium 141  personally reviewed   Lipid Panel  Labs(Brief)  No results found for: CHOL, TRIG, HDL, CHOLHDL, VLDL, LDLCALC, LDLDIRECT   personally reviewed      Wt Readings from Last 3 Encounters:  07/25/18 196 lb 3.2 oz (89 kg)  07/20/18 200 lb (90.7 kg)  07/13/18 199 lb 9.6 oz (90.5 kg)      Other studies personally reviewed: Additional studies/ records that were reviewed today include: Dr Kennon Holter notes prior TEE   Review of the above records today demonstrates: as above   ASSESSMENT AND PLAN:  1.  Persistent afib/ typical atrial flutter The patient has symptomatic atrial arrhythmias He reports missing a dose of xarelto 07/18/18.  He does not think that he could wait 3 weeks prior to cardioversion.  He therefore requests tee guided cardioversion. Increase amiodarone to 200mg  BID x 2 weeks then resume current therapy. Follow-up in AF clinic post cardioversion.  2D echo to evaluate LA size which would be helpful to determine long term management.  As this is his first episode of afib in 5 years, It may be best to see how he does post cardioversion.  If he remains in sinus rhythm, then continued amiodarone would be reasonable.  Given his advanced age, I would reserve ablation for more frequent episodes of afib.  Importance of compliance with xarelto without interruption was discussed at length today.   Follow-up:  AF clinic after cardioversion  Current medicines are reviewed at length with the patient today.   The patient does not have concerns regarding his medicines.  The following changes were made today:  none  Labs/ tests ordered today include:     Orders Placed This Encounter  Procedures  . Flu vaccine HIGH DOSE PF  . EKG 12-Lead     Signed, Thompson Grayer, MD  07/25/2018 4:31 PM     [facility] [address] [place] [address] [phone] (office) [phone] (fax)     For TEE DCCV; on xarelto; no changes Kirk Ruths

## 2018-08-02 NOTE — Anesthesia Preprocedure Evaluation (Addendum)
Anesthesia Evaluation  Patient identified by MRN, date of birth, ID band Patient awake, Patient confused and Patient unresponsive    Reviewed: Allergy & Precautions, NPO status , Patient's Chart, lab work & pertinent test results  Airway Mallampati: II  TM Distance: >3 FB Neck ROM: Full    Dental no notable dental hx. (+) Caps   Pulmonary former smoker,    Pulmonary exam normal breath sounds clear to auscultation       Cardiovascular hypertension, Pt. on medications + dysrhythmias Atrial Fibrillation  Rhythm:Irregular Rate:Normal + Carotid Bruit    Neuro/Psych Peripheral neuropathy  Neuromuscular disease negative psych ROS   GI/Hepatic Neg liver ROS, Hx/o colon polyps   Endo/Other  Hyperlipidemia  Renal/GU negative Renal ROS  negative genitourinary   Musculoskeletal negative musculoskeletal ROS (+)   Abdominal   Peds  Hematology Xarelto - last dose last PM Thrombocytopenia   Anesthesia Other Findings   Reproductive/Obstetrics                            Anesthesia Physical Anesthesia Plan  ASA: III  Anesthesia Plan: General   Post-op Pain Management:    Induction: Intravenous  PONV Risk Score and Plan: 2 and Treatment may vary due to age or medical condition, Ondansetron and Propofol infusion  Airway Management Planned: Mask  Additional Equipment:   Intra-op Plan:   Post-operative Plan:   Informed Consent: I have reviewed the patients History and Physical, chart, labs and discussed the procedure including the risks, benefits and alternatives for the proposed anesthesia with the patient or authorized representative who has indicated his/her understanding and acceptance.   Dental advisory given  Plan Discussed with: CRNA and Surgeon  Anesthesia Plan Comments:         Anesthesia Quick Evaluation

## 2018-08-02 NOTE — Transfer of Care (Signed)
Immediate Anesthesia Transfer of Care Note  Patient: Stanley Waters  Procedure(s) Performed: TRANSESOPHAGEAL ECHOCARDIOGRAM (TEE) (N/A ) CARDIOVERSION (N/A )  Patient Location: PACU and Endoscopy Unit  Anesthesia Type:MAC  Level of Consciousness: awake, alert  and patient cooperative  Airway & Oxygen Therapy: Patient Spontanous Breathing and Patient connected to nasal cannula oxygen  Post-op Assessment: Report given to RN and Post -op Vital signs reviewed and stable  Post vital signs: Reviewed and stable  Last Vitals:  Vitals Value Taken Time  BP    Temp    Pulse    Resp    SpO2      Last Pain:  Vitals:   08/02/18 1143  TempSrc: Oral         Complications: No apparent anesthesia complications

## 2018-08-02 NOTE — Progress Notes (Signed)
  Echocardiogram Echocardiogram Transesophageal has been performed.  Stanley Waters 08/02/2018, 1:34 PM

## 2018-08-04 ENCOUNTER — Encounter (HOSPITAL_COMMUNITY): Payer: Self-pay | Admitting: Cardiology

## 2018-08-09 ENCOUNTER — Encounter (HOSPITAL_COMMUNITY): Payer: Self-pay | Admitting: Nurse Practitioner

## 2018-08-09 ENCOUNTER — Ambulatory Visit (HOSPITAL_COMMUNITY)
Admission: RE | Admit: 2018-08-09 | Discharge: 2018-08-09 | Disposition: A | Discharge status: Home / Self Care | Payer: Medicare HMO | Source: Ambulatory Visit | Attending: Nurse Practitioner | Admitting: Nurse Practitioner

## 2018-08-09 VITALS — BP 164/80 | HR 56 | Ht 72.0 in | Wt 199.0 lb

## 2018-08-09 DIAGNOSIS — Z881 Allergy status to other antibiotic agents status: Secondary | ICD-10-CM | POA: Insufficient documentation

## 2018-08-09 DIAGNOSIS — N400 Enlarged prostate without lower urinary tract symptoms: Secondary | ICD-10-CM | POA: Diagnosis not present

## 2018-08-09 DIAGNOSIS — R531 Weakness: Secondary | ICD-10-CM | POA: Diagnosis not present

## 2018-08-09 DIAGNOSIS — I4891 Unspecified atrial fibrillation: Secondary | ICD-10-CM | POA: Diagnosis present

## 2018-08-09 DIAGNOSIS — I10 Essential (primary) hypertension: Secondary | ICD-10-CM | POA: Insufficient documentation

## 2018-08-09 DIAGNOSIS — I480 Paroxysmal atrial fibrillation: Secondary | ICD-10-CM | POA: Diagnosis not present

## 2018-08-09 DIAGNOSIS — Z888 Allergy status to other drugs, medicaments and biological substances status: Secondary | ICD-10-CM | POA: Insufficient documentation

## 2018-08-09 DIAGNOSIS — R001 Bradycardia, unspecified: Secondary | ICD-10-CM | POA: Insufficient documentation

## 2018-08-09 DIAGNOSIS — Z87891 Personal history of nicotine dependence: Secondary | ICD-10-CM | POA: Insufficient documentation

## 2018-08-09 DIAGNOSIS — E785 Hyperlipidemia, unspecified: Secondary | ICD-10-CM | POA: Diagnosis not present

## 2018-08-09 DIAGNOSIS — I440 Atrioventricular block, first degree: Secondary | ICD-10-CM | POA: Insufficient documentation

## 2018-08-09 DIAGNOSIS — Z8249 Family history of ischemic heart disease and other diseases of the circulatory system: Secondary | ICD-10-CM | POA: Diagnosis not present

## 2018-08-09 DIAGNOSIS — I4892 Unspecified atrial flutter: Secondary | ICD-10-CM | POA: Diagnosis not present

## 2018-08-09 DIAGNOSIS — Z79899 Other long term (current) drug therapy: Secondary | ICD-10-CM | POA: Diagnosis not present

## 2018-08-09 DIAGNOSIS — G629 Polyneuropathy, unspecified: Secondary | ICD-10-CM | POA: Insufficient documentation

## 2018-08-09 DIAGNOSIS — E559 Vitamin D deficiency, unspecified: Secondary | ICD-10-CM | POA: Insufficient documentation

## 2018-08-09 DIAGNOSIS — Z7901 Long term (current) use of anticoagulants: Secondary | ICD-10-CM | POA: Diagnosis not present

## 2018-08-09 NOTE — Progress Notes (Signed)
Primary Care Physician: Deland Pretty, MD Referring Physician: Dr. Rayann Heman Cardiologist: Dr.Berry   Stanley Waters is a 83 y.o. male with a h/o afib that is being seen in the afib clinic for f/u cardioversion. Dr. Rayann Heman recently saw pt and increased his amiodarone to 200 mg bid . He continues in SR today and amiodarone was decreased to one a day starting today. He has noted some recent weakness, this may be a combination of the afib and increase in amiodarone. He did have a TEE guided cardioversion. Dr. Rayann Heman also wanted pt to have a 2D echo on return to [place].   Today, he denies symptoms of palpitations, chest pain, shortness of breath, orthopnea, PND, lower extremity edema, dizziness, presyncope, syncope, or neurologic sequela. + intermittent weakness. The patient is tolerating medications without difficulties and is otherwise without complaint today.   Past Medical History:  Diagnosis Date  . Atrial flutter ([place])   . Atypical chest pain    Myoview performed 07/23/11 was completely normal. Post stress EF 61%.  . Bruit    Left asymptomatic bruit. Carotid Duplex 12/13/12 =mildly abnormal. *BILATERAL BULB/PROXIMAL ICAs: Demonstrated a mild amount of fibrous plaque w/no evidence od significant diameter reduction, tortuosity or other vascular abnormality.  . Enlarged prostate    BPH - PSA of 6.7 this is consistant with his last PSA of 6.3  . Hyperlipemia   . Hypertension   . Inguinal hernia    Inguinal hernia repair (x) 2 1991, 2011  . Intestinal polyps 09/22/11   Colonoscopy removed a 2 mm sessile cecal polyp with a cold snare.  . Measles   . Mumps   . Normal coronary arteries 2002  . Peripheral neuropathy   . Persistent atrial fibrillation   . Seasonal allergies   . Skin cancer   . Thrombocytopenia ([place]) 2011   Very mild with platelets of 135,000.  [name] Tinnitus   . Umbilical hernia   . Vitamin D deficiency    Past Surgical History:  Procedure Laterality Date  . CARDIAC  CATHETERIZATION  2002   "Normal cardiac catheterization"  . CARDIOVERSION  07/07/2012   A fib - Cardioversion successful.  [name] CARDIOVERSION N/A 06/30/2013   Procedure: CARDIOVERSION;  Surgeon: Sanda Klein, MD;  Location: [place];  Service: Cardiovascular;  Laterality: N/A;  . CARDIOVERSION N/A 08/02/2018   Procedure: CARDIOVERSION;  Surgeon: Lelon Perla, MD;  Location: [facility] ENDOSCOPY;  Service: Cardiovascular;  Laterality: N/A;  . Carotid Duplex  12/13/12   For asymptomatic bruit. Mildly abnormal.  *BILATERAL BULB/PROXIMAL ICAs: Demonstrated a mild amount of fibrous plaque w/no evidence of significant diameter reduction, tortuosity or other vascular abnormality.   . COLONOSCOPY W/ POLYPECTOMY  09/22/11   2 mm sessile cecal polyp was removed with a cold snare.  [name] HAND SURGERY    . [place] & 2011  . TEE WITHOUT CARDIOVERSION  07/07/2012   Procedure: TRANSESOPHAGEAL ECHOCARDIOGRAM (TEE);  Surgeon: Pixie Casino, MD;  Location: [facility] ENDOSCOPY;  Service: Cardiovascular;  Laterality: N/A;  . TEE WITHOUT CARDIOVERSION N/A 08/02/2018   Procedure: TRANSESOPHAGEAL ECHOCARDIOGRAM (TEE);  Surgeon: Lelon Perla, MD;  Location: [facility] ENDOSCOPY;  Service: Cardiovascular;  Laterality: N/A;    Current Outpatient Medications  Medication Sig Dispense Refill  . amiodarone (PACERONE) 200 MG tablet Take 1 tablet (200 mg total) by mouth daily. 90 tablet 3  . Cholecalciferol (VITAMIN D3) 2000 units TABS Take 2,000 Units by mouth daily.    [name] doxazosin (CARDURA) 8  MG tablet Take 1 tablet (8 mg total) by mouth at bedtime. 90 tablet 3  . furosemide (LASIX) 20 MG tablet Take 20 mg by mouth daily as needed for fluid.     [name] lisinopril (PRINIVIL,ZESTRIL) 20 MG tablet Take 20 mg by mouth as needed.    . Multiple Vitamins-Minerals (ICAPS) CAPS Take 1 capsule by mouth every other day.    . Multiple Vitamins-Minerals (PRESERVISION AREDS 2 PO) Take 1 tablet by mouth every other day.    . OMEGA-3 KRILL  OIL PO Take 1 capsule by mouth at bedtime.     [name] OVER THE COUNTER MEDICATION Apply 1 application topically daily as needed (MagnaLife Pain Relieving Foot Cream).    . polyethylene glycol (MIRALAX / GLYCOLAX) packet Take 17 g by mouth daily as needed for moderate constipation.    [name] rOPINIRole (REQUIP) 0.25 MG tablet Take 0.25 mg by mouth at bedtime.     . simvastatin (ZOCOR) 5 MG tablet Take 5 mg by mouth daily.     [name] triamcinolone cream (KENALOG) 0.1 % Apply 1 application topically 2 (two) times daily as needed (SKIN RASHES).     [name] 20 MG TABS tablet TAKE 1 TABLET EVERY DAY WITH SUPPER (Patient taking differently: Take 20 mg by mouth daily. ) 90 tablet 1   No current facility-administered medications for this encounter.     Allergies  Allergen Reactions  . Prednisone Swelling, Other (See Comments) and Hypertension    Tachycardia  . Doxycycline Rash    Social History   Socioeconomic History  . Marital status: Widowed    Spouse name: Not on file  . Number of children: 2  . Years of education: Not on file  . Highest education level: Not on file  Occupational History    Employer: RETIRED    Comment: [employer]  . Financial resource strain: Not on file  . Food insecurity:    Worry: Not on file    Inability: Not on file  . Transportation needs:    Medical: Not on file    Non-medical: Not on file  Tobacco Use  . Smoking status: Former Smoker    Types: Cigarettes    Last attempt to quit: 11/03/1963    Years since quitting: 54.8  . Smokeless tobacco: Never Used  Substance and Sexual Activity  . Alcohol use: No  . Drug use: No  . Sexual activity: Not on file  Lifestyle  . Physical activity:    Days per week: Not on file    Minutes per session: Not on file  . Stress: Not on file  Relationships  . Social connections:    Talks on phone: Not on file    Gets together: Not on file    Attends religious service: Not on file    Active member of club or  organization: Not on file    Attends meetings of clubs or organizations: Not on file    Relationship status: Not on file  . Intimate partner violence:    Fear of current or ex partner: Not on file    Emotionally abused: Not on file    Physically abused: Not on file    Forced sexual activity: Not on file  Other Topics Concern  . Not on file  Social History Narrative   Lives in [place], alone   Retired from [employer]    Family History  Problem Relation Age of Onset  . Heart failure Brother  ROS- All systems are reviewed and negative except as per the HPI above  Physical Exam: Vitals:   08/09/18 0948  BP: (!) 164/80  Pulse: (!) 56  Weight: 90.3 kg  Height: 6' (1.829 m)   Wt Readings from Last 3 Encounters:  08/09/18 90.3 kg  07/25/18 89 kg  07/20/18 90.7 kg    Labs: Lab Results  Component Value Date   NA 141 08/02/2018   K 3.6 08/02/2018   CL 106 07/19/2018   CO2 24 07/19/2018   GLUCOSE 98 08/02/2018   BUN 15 07/19/2018   CREATININE 1.13 07/19/2018   CALCIUM 8.9 07/19/2018   MG 2.0 07/19/2018   No results found for: INR No results found for: CHOL, HDL, LDLCALC, TRIG   GEN- The patient is well appearing, alert and oriented x 3 today.   Head- normocephalic, atraumatic Eyes-  Sclera clear, conjunctiva pink Ears- hearing intact Oropharynx- clear Neck- supple, no JVP Lymph- no cervical lymphadenopathy Lungs- Clear to ausculation bilaterally, normal work of breathing Heart- Regular rate and rhythm, no murmurs, rubs or gallops, PMI not laterally displaced GI- soft, NT, ND, + BS Extremities- no clubbing, cyanosis, or edema MS- no significant deformity or atrophy Skin- no rash or lesion Psych- euthymic mood, full affect Neuro- strength and sensation are intact  EKG- Sinus brady at 56 bpm, PR int 273 ms, qrs int 94 ms, qtc 465 ms    Assessment and Plan: 1. Afib Successful cardioversion after increase in amiodarone to 200 mg bid Continues in  SR and amiodarone was reduced to 1 tab a day as of today Per Dr. Jackalyn Lombard note 2D echo needs to be  performed  2. CHA2DS2VASc score of at least 3 Continue xarelto 20 mg daily  3. Recent intermittent weakness May be combination of recent afib and increase in amiodarone Hopefully will improve as amiodarone level returns to  normal levels  F/u with Dr. [name] in one month  Geroge Baseman. [place], [facility] [address] [place], [place] [address] [phone]

## 2018-08-11 DIAGNOSIS — I10 Essential (primary) hypertension: Secondary | ICD-10-CM | POA: Diagnosis not present

## 2018-08-11 DIAGNOSIS — E785 Hyperlipidemia, unspecified: Secondary | ICD-10-CM | POA: Diagnosis not present

## 2018-08-11 DIAGNOSIS — D709 Neutropenia, unspecified: Secondary | ICD-10-CM | POA: Diagnosis not present

## 2018-08-18 ENCOUNTER — Ambulatory Visit (HOSPITAL_COMMUNITY)
Admission: RE | Admit: 2018-08-18 | Discharge: 2018-08-18 | Disposition: A | Discharge status: Home / Self Care | Payer: Medicare HMO | Source: Ambulatory Visit | Attending: Nurse Practitioner | Admitting: Nurse Practitioner

## 2018-08-18 DIAGNOSIS — N182 Chronic kidney disease, stage 2 (mild): Secondary | ICD-10-CM | POA: Diagnosis not present

## 2018-08-18 DIAGNOSIS — D709 Neutropenia, unspecified: Secondary | ICD-10-CM | POA: Diagnosis not present

## 2018-08-18 DIAGNOSIS — E785 Hyperlipidemia, unspecified: Secondary | ICD-10-CM | POA: Diagnosis not present

## 2018-08-18 DIAGNOSIS — D696 Thrombocytopenia, unspecified: Secondary | ICD-10-CM | POA: Diagnosis not present

## 2018-08-18 DIAGNOSIS — I480 Paroxysmal atrial fibrillation: Secondary | ICD-10-CM | POA: Diagnosis not present

## 2018-08-18 DIAGNOSIS — I10 Essential (primary) hypertension: Secondary | ICD-10-CM | POA: Diagnosis not present

## 2018-08-18 DIAGNOSIS — I4891 Unspecified atrial fibrillation: Secondary | ICD-10-CM | POA: Diagnosis not present

## 2018-08-18 DIAGNOSIS — I340 Nonrheumatic mitral (valve) insufficiency: Secondary | ICD-10-CM | POA: Diagnosis not present

## 2018-08-18 DIAGNOSIS — M542 Cervicalgia: Secondary | ICD-10-CM | POA: Diagnosis not present

## 2018-08-18 NOTE — Progress Notes (Signed)
  Echocardiogram 2D Echocardiogram has been performed.  Merrie Roof F 08/18/2018, 2:24 PM

## 2018-08-24 ENCOUNTER — Encounter: Payer: Self-pay | Admitting: Internal Medicine

## 2018-08-24 ENCOUNTER — Ambulatory Visit: Payer: Medicare HMO | Admitting: Internal Medicine

## 2018-08-24 VITALS — BP 112/68 | HR 74 | Ht 72.0 in | Wt 193.8 lb

## 2018-08-24 DIAGNOSIS — R0602 Shortness of breath: Secondary | ICD-10-CM | POA: Diagnosis not present

## 2018-08-24 DIAGNOSIS — I480 Paroxysmal atrial fibrillation: Secondary | ICD-10-CM

## 2018-08-24 LAB — BASIC METABOLIC PANEL
BUN/Creatinine Ratio: 15 (ref 10–24)
BUN: 17 mg/dL (ref 8–27)
CO2: 24 mmol/L (ref 20–29)
Calcium: 9.2 mg/dL (ref 8.6–10.2)
Chloride: 104 mmol/L (ref 96–106)
Creatinine, Ser: 1.14 mg/dL (ref 0.76–1.27)
GFR calc Af Amer: 69 mL/min/{1.73_m2} (ref >59)
GFR, EST NON AFRICAN AMERICAN: 60 mL/min/{1.73_m2} (ref >59)
Glucose: 86 mg/dL (ref 65–99)
Potassium: 3.6 mmol/L (ref 3.5–5.2)
SODIUM: 143 mmol/L (ref 134–144)

## 2018-08-24 LAB — CBC WITH DIFFERENTIAL/PLATELET
BASOS: 0 %
Basophils Absolute: 0 10*3/uL (ref 0.0–0.2)
EOS (ABSOLUTE): 0.2 10*3/uL (ref 0.0–0.4)
Eos: 3 %
Hematocrit: 43.2 % (ref 37.5–51.0)
Hemoglobin: 15.1 g/dL (ref 13.0–17.7)
IMMATURE GRANS (ABS): 0 10*3/uL (ref 0.0–0.1)
Immature Granulocytes: 0 %
LYMPHS: 12 %
Lymphocytes Absolute: 0.7 10*3/uL (ref 0.7–3.1)
MCH: 32.6 pg (ref 26.6–33.0)
MCHC: 35 g/dL (ref 31.5–35.7)
MCV: 93 fL (ref 79–97)
Monocytes Absolute: 0.6 10*3/uL (ref 0.1–0.9)
Monocytes: 10 %
NEUTROS ABS: 4 10*3/uL (ref 1.4–7.0)
Neutrophils: 75 %
Platelets: 161 10*3/uL (ref 150–450)
RBC: 4.63 x10E6/uL (ref 4.14–5.80)
RDW: 12.5 % (ref 12.3–15.4)
WBC: 5.4 10*3/uL (ref 3.4–10.8)

## 2018-08-24 MED ORDER — METOPROLOL TARTRATE 50 MG PO TABS: 50.0000 mg | ORAL_TABLET | Freq: Once | ORAL | 0 refills | Status: DC

## 2018-08-24 NOTE — Patient Instructions (Addendum)
Medication Instructions:  Your physician recommends that you continue on your current medications as directed. Please refer to the Current Medication list given to you today.  If you need a refill on your cardiac medications before your next appointment, please call your pharmacy.   Lab work: You will get lab work today:  BMP and CBC  If you have labs (blood work) drawn today and your tests are completely normal, you will receive your results only by: [name] MyChart Message (if you have MyChart) OR . A paper copy in the mail If you have any lab test that is abnormal or we need to change your treatment, we will call you to review the results.  Testing/Procedures: Your physician has requested that you have cardiac CT. Cardiac computed tomography (CT) is a painless test that uses an x-ray machine to take clear, detailed pictures of your heart. For further information please visit [employer]uge[employer]tn. Please follow instruction sheet as given . Please schedule cardiac CT with [facility]  Your physician has recommended that you have an ablation. Catheter ablation is a medical procedure used to treat some cardiac arrhythmias (irregular heartbeats). During catheter ablation, a long, thin, flexible tube is put into a blood vessel in your groin (upper thigh), or neck. This tube is called an ablation catheter. It is then guided to your heart through the blood vessel. Radio frequency waves destroy small areas of heart tissue where abnormal heartbeats may cause an arrhythmia to start. Please see the instruction sheet given to you today.  Follow-Up:  You will follow up with Roderic Palau, NP with the Atrial fibrillation (Afib) clinic 4 weeks after your ablation.  You will follow up with Dr. Rayann Heman 3 months after your procedure.   CARDIAC CT INSTRUCTIONS:  Please arrive at the [facility] main entrance of [facility] at xx:xx AM (30-45 minutes prior to test start time)  [facility] [place], [place] [address] [phone]  Proceed to the [facility] Radiology Department (First Floor).  Please follow these instructions carefully (unless otherwise directed):  Hold all erectile dysfunction medications at least 48 hours prior to test.  On the Night Before the Test: . Be sure to Drink plenty of water. . Do not consume any caffeinated/decaffeinated beverages or chocolate 12 hours prior to your test. . Do not take any antihistamines 12 hours prior to your test.  On the Day of the Test: . Drink plenty of water. Do not drink any water within one hour of the test. . Do not eat any food 4 hours prior to the test. . You may take your regular medications prior to the test.  . Take metoprolol (Lopressor) two hours prior to test. . HOLD Furosemide morning of the test.         -Take metoprolol (Lopressor) 2 hours prior to test (if applicable).                  -If HR is less than 55 BPM- No Beta Blocker                -If HR is greater than 55 BPM and patient is greater than 31 yrs old Lopressor 50 mg x1.      After the Test: . Drink plenty of water. . After receiving IV contrast, you may experience a mild flushed feeling. This is normal. . On occasion, you may experience a mild rash up to 24 hours after the test. This is not  dangerous. If this occurs, you can take Benadryl 25 mg and increase your fluid intake. . If you experience trouble breathing, this can be serious. If it is severe call 911 IMMEDIATELY. If it is mild, please call our office.    ABLATION INSTRUCTIONS:  Please arrive at the [facility] main entrance of [facility] hospital at: 7:30 am on September 20, 2018 Do not eat or drink after midnight prior to procedure On the morning of your procedure do not take any medications. Plan for one night stay.   You will need someone to drive you home at discharge.     Cardiac Ablation Cardiac ablation is a procedure to disable (ablate) a small amount  of heart tissue in very specific places. The heart has many electrical connections. Sometimes these connections are abnormal and can cause the heart to beat very fast or irregularly. Ablating some of the problem areas can improve the heart rhythm or return it to normal. Ablation may be done for people who:  Have Wolff-Parkinson-White syndrome.  Have fast heart rhythms (tachycardia).  Have taken medicines for an abnormal heart rhythm (arrhythmia) that were not effective or caused side effects.  Have a high-risk heartbeat that may be life-threatening.  During the procedure, a small incision is made in the neck or the groin, and a long, thin, flexible tube (catheter) is inserted into the incision and moved to the heart. Small devices (electrodes) on the tip of the catheter will send out electrical currents. A type of X-ray (fluoroscopy) will be used to help guide the catheter and to provide images of the heart. Tell a health care provider about:  Any allergies you have.  All medicines you are taking, including vitamins, herbs, eye drops, creams, and over-the-counter medicines.  Any problems you or family members have had with anesthetic medicines.  Any blood disorders you have.  Any surgeries you have had.  Any medical conditions you have, such as kidney failure.  Whether you are pregnant or may be pregnant. What are the risks? Generally, this is a safe procedure. However, problems may occur, including:  Infection.  Bruising and bleeding at the catheter insertion site.  Bleeding into the chest, especially into the sac that surrounds the heart. This is a serious complication.  Stroke or blood clots.  Damage to other structures or organs.  Allergic reaction to medicines or dyes.  Need for a permanent pacemaker if the normal electrical system is damaged. A pacemaker is a small computer that sends electrical signals to the heart and helps your heart beat normally.  The procedure  not being fully effective. This may not be recognized until months later. Repeat ablation procedures are sometimes required.  What happens before the procedure?  Follow instructions from your health care provider about eating or drinking restrictions.  Ask your health care provider about: ? Changing or stopping your regular medicines. This is especially important if you are taking diabetes medicines or blood thinners. ? Taking medicines such as aspirin and ibuprofen. These medicines can thin your blood. Do not take these medicines before your procedure if your health care provider instructs you not to.  Plan to have someone take you home from the hospital or clinic.  If you will be going home right after the procedure, plan to have someone with you for 24 hours. What happens during the procedure?  To lower your risk of infection: ? Your health care team will wash or sanitize their hands. ? Your skin will be  washed with soap. ? Hair may be removed from the incision area.  An IV tube will be inserted into one of your veins.  You will be given a medicine to help you relax (sedative).  The skin on your neck or groin will be numbed.  An incision will be made in your neck or your groin.  A needle will be inserted through the incision and into a large vein in your neck or groin.  A catheter will be inserted into the needle and moved to your heart.  Dye may be injected through the catheter to help your surgeon see the area of the heart that needs treatment.  Electrical currents will be sent from the catheter to ablate heart tissue in desired areas. There are three types of energy that may be used to ablate heart tissue: ? Heat (radiofrequency energy). ? Laser energy. ? Extreme cold (cryoablation).  When the necessary tissue has been ablated, the catheter will be removed.  Pressure will be held on the catheter insertion area to prevent excessive bleeding.  A bandage (dressing) will  be placed over the catheter insertion area. The procedure may vary among health care providers and hospitals. What happens after the procedure?  Your blood pressure, heart rate, breathing rate, and blood oxygen level will be monitored until the medicines you were given have worn off.  Your catheter insertion area will be monitored for bleeding. You will need to lie still for a few hours to ensure that you do not bleed from the catheter insertion area.  Do not drive for 24 hours or as long as directed by your health care provider. Summary  Cardiac ablation is a procedure to disable (ablate) a small amount of heart tissue in very specific places. Ablating some of the problem areas can improve the heart rhythm or return it to normal.  During the procedure, electrical currents will be sent from the catheter to ablate heart tissue in desired areas. This information is not intended to replace advice given to you by your health care provider. Make sure you discuss any questions you have with your health care provider. Document Released: 03/07/2009 Document Revised: 09/07/2016 Document Reviewed: 09/07/2016 Elsevier Interactive Patient Education  [employer].

## 2018-08-24 NOTE — Progress Notes (Signed)
PCP: Deland Pretty, MD Primary Cardiologist: Dr [name] Primary EP: Dr Rayann Heman  Stanley Waters is a 83 y.o. male who presents today for add on EPfollowup.   He is back in afib today after a recent cardioversion.   He reports that he thinks that he returned to afib the same day.  He has SOB, palpitations, and fatigue.  He is very disappointed.   Today, he denies symptoms of chest pain, lower extremity edema, dizziness, presyncope, or syncope.  The patient is otherwise without complaint today.   Past Medical History:  Diagnosis Date  . Atrial flutter ([place])   . Atypical chest pain    Myoview performed 07/23/11 was completely normal. Post stress EF 61%.  . Bruit    Left asymptomatic bruit. Carotid Duplex 12/13/12 =mildly abnormal. *BILATERAL BULB/PROXIMAL ICAs: Demonstrated a mild amount of fibrous plaque w/no evidence od significant diameter reduction, tortuosity or other vascular abnormality.  . Enlarged prostate    BPH - PSA of 6.7 this is consistant with his last PSA of 6.3  . Hyperlipemia   . Hypertension   . Inguinal hernia    Inguinal hernia repair (x) 2 1991, 2011  . Intestinal polyps 09/22/11   Colonoscopy removed a 2 mm sessile cecal polyp with a cold snare.  . Measles   . Mumps   . Normal coronary arteries 2002  . Peripheral neuropathy   . Persistent atrial fibrillation   . Seasonal allergies   . Skin cancer   . Thrombocytopenia ([place]) 2011   Very mild with platelets of 135,000.  [name] Tinnitus   . Umbilical hernia   . Vitamin D deficiency    Past Surgical History:  Procedure Laterality Date  . CARDIAC CATHETERIZATION  2002   "Normal cardiac catheterization"  . CARDIOVERSION  07/07/2012   A fib - Cardioversion successful.  [name] CARDIOVERSION N/A 06/30/2013   Procedure: CARDIOVERSION;  Surgeon: Sanda Klein, MD;  Location: [place];  Service: Cardiovascular;  Laterality: N/A;  . CARDIOVERSION N/A 08/02/2018   Procedure: CARDIOVERSION;  Surgeon: Lelon Perla, MD;  Location:  [facility] ENDOSCOPY;  Service: Cardiovascular;  Laterality: N/A;  . Carotid Duplex  12/13/12   For asymptomatic bruit. Mildly abnormal.  *BILATERAL BULB/PROXIMAL ICAs: Demonstrated a mild amount of fibrous plaque w/no evidence of significant diameter reduction, tortuosity or other vascular abnormality.   . COLONOSCOPY W/ POLYPECTOMY  09/22/11   2 mm sessile cecal polyp was removed with a cold snare.  [name] HAND SURGERY    . [place] & 2011  . TEE WITHOUT CARDIOVERSION  07/07/2012   Procedure: TRANSESOPHAGEAL ECHOCARDIOGRAM (TEE);  Surgeon: Pixie Casino, MD;  Location: [facility] ENDOSCOPY;  Service: Cardiovascular;  Laterality: N/A;  . TEE WITHOUT CARDIOVERSION N/A 08/02/2018   Procedure: TRANSESOPHAGEAL ECHOCARDIOGRAM (TEE);  Surgeon: Lelon Perla, MD;  Location: [facility] ENDOSCOPY;  Service: Cardiovascular;  Laterality: N/A;    ROS- all systems are reviewed and negatives except as per HPI above  Current Outpatient Medications  Medication Sig Dispense Refill  . amiodarone (PACERONE) 200 MG tablet Take 1 tablet (200 mg total) by mouth daily. 90 tablet 3  . Cholecalciferol (VITAMIN D3) 2000 units TABS Take 2,000 Units by mouth daily.    [name] doxazosin (CARDURA) 8 MG tablet Take 1 tablet (8 mg total) by mouth at bedtime. 90 tablet 3  . furosemide (LASIX) 20 MG tablet Take 20 mg by mouth daily as needed for fluid.     [name] lisinopril (PRINIVIL,ZESTRIL) 20 MG  tablet Take 20 mg by mouth as needed.    . Multiple Vitamins-Minerals (ICAPS) CAPS Take 1 capsule by mouth every other day.    . Multiple Vitamins-Minerals (PRESERVISION AREDS 2 PO) Take 1 tablet by mouth every other day.    . OMEGA-3 KRILL OIL PO Take 1 capsule by mouth at bedtime.     [name] OVER THE COUNTER MEDICATION Apply 1 application topically daily as needed (MagnaLife Pain Relieving Foot Cream).    . polyethylene glycol (MIRALAX / GLYCOLAX) packet Take 17 g by mouth daily as needed for moderate constipation.    [name] rOPINIRole (REQUIP) 0.25 MG  tablet Take 0.25 mg by mouth at bedtime.     . simvastatin (ZOCOR) 5 MG tablet Take 5 mg by mouth daily.     [name] triamcinolone cream (KENALOG) 0.1 % Apply 1 application topically 2 (two) times daily as needed (SKIN RASHES).     [name] 20 MG TABS tablet TAKE 1 TABLET EVERY DAY WITH SUPPER (Patient taking differently: Take 20 mg by mouth daily. ) 90 tablet 1   No current facility-administered medications for this visit.     Physical Exam: Vitals:   08/24/18 0832  BP: 112/68  Pulse: 74  SpO2: 97%  Weight: 193 lb 12.8 oz (87.9 kg)  Height: 6' (1.829 m)    GEN- The patient is well appearing, alert and oriented x 3 today.   Head- normocephalic, atraumatic Eyes-  Sclera clear, conjunctiva pink Ears- hearing intact Oropharynx- clear Lungs- Clear to ausculation bilaterally, normal work of breathing Heart- irregular rate and rhythm, no murmurs, rubs or gallops, PMI not laterally displaced GI- soft, NT, ND, + BS Extremities- no clubbing, cyanosis, or edema  Wt Readings from Last 3 Encounters:  08/24/18 193 lb 12.8 oz (87.9 kg)  08/09/18 199 lb (90.3 kg)  07/25/18 196 lb 3.2 oz (89 kg)    EKG tracing ordered today is personally reviewed and shows afib, V rate 74 bpm, PVCs, nonspecific ST/T changes  Assessment and Plan:  1. Persistent afib/ typical atrial flutter  Severe LA enlargement noted on echo 08/18/18 Continue rate control Continue amiodarone for now he has failed medical therapy with amiodarone. Chads2vasc score is 3.  he is anticoagulated with xarelto . Therapeutic strategies for afib including medicine and ablation were discussed in detail with the patient today. Risk, benefits, and alternatives to EP study and radiofrequency ablation for afib were also discussed in detail today. These risks include but are not limited to stroke, bleeding, vascular damage, tamponade, perforation, damage to the esophagus, lungs, and other structures, pulmonary vein stenosis, worsening renal  function, and death. The patient understands these risk and wishes to proceed.  We will therefore proceed with catheter ablation at the next available time.  Carto, ICE, anesthesia are requested for the procedure.  Will also obtain cardiac CT prior to the procedure to exclude LAA thrombus and further evaluate atrial anatomy.  Given SOB, will order CT with FFR to evaluate for CAD also.  2. HTN Stable No change required today  Thompson Grayer MD, [facility] 08/24/2018 8:38 AM

## 2018-08-24 NOTE — H&P (View-Only) (Signed)
PCP: Deland Pretty, MD Primary Cardiologist: Dr [name] Primary EP: Dr Rayann Heman  Stanley Waters is a 83 y.o. male who presents today for add on EPfollowup.   He is back in afib today after a recent cardioversion.   He reports that he thinks that he returned to afib the same day.  He has SOB, palpitations, and fatigue.  He is very disappointed.   Today, he denies symptoms of chest pain, lower extremity edema, dizziness, presyncope, or syncope.  The patient is otherwise without complaint today.   Past Medical History:  Diagnosis Date  . Atrial flutter ([place])   . Atypical chest pain    Myoview performed 07/23/11 was completely normal. Post stress EF 61%.  . Bruit    Left asymptomatic bruit. Carotid Duplex 12/13/12 =mildly abnormal. *BILATERAL BULB/PROXIMAL ICAs: Demonstrated a mild amount of fibrous plaque w/no evidence od significant diameter reduction, tortuosity or other vascular abnormality.  . Enlarged prostate    BPH - PSA of 6.7 this is consistant with his last PSA of 6.3  . Hyperlipemia   . Hypertension   . Inguinal hernia    Inguinal hernia repair (x) 2 1991, 2011  . Intestinal polyps 09/22/11   Colonoscopy removed a 2 mm sessile cecal polyp with a cold snare.  . Measles   . Mumps   . Normal coronary arteries 2002  . Peripheral neuropathy   . Persistent atrial fibrillation   . Seasonal allergies   . Skin cancer   . Thrombocytopenia ([place]) 2011   Very mild with platelets of 135,000.  [name] Tinnitus   . Umbilical hernia   . Vitamin D deficiency    Past Surgical History:  Procedure Laterality Date  . CARDIAC CATHETERIZATION  2002   "Normal cardiac catheterization"  . CARDIOVERSION  07/07/2012   A fib - Cardioversion successful.  [name] CARDIOVERSION N/A 06/30/2013   Procedure: CARDIOVERSION;  Surgeon: Sanda Klein, MD;  Location: [place];  Service: Cardiovascular;  Laterality: N/A;  . CARDIOVERSION N/A 08/02/2018   Procedure: CARDIOVERSION;  Surgeon: Lelon Perla, MD;  Location[facility]  [facility] ENDOSCOPY;  Service: Cardiovascular;  Laterality: N/A;  . Carotid Duplex  12/13/12   For asymptomatic bruit. Mildly abnormal.  *BILATERAL BULB/PROXIMAL ICAs: Demonstrated a mild amount of fibrous plaque w/no evidence of significant diameter reduction, tortuosity or other vascular abnormality.   . COLONOSCOPY W/ POLYPECTOMY  09/22/11   2 mm sessile cecal polyp was removed with a cold snare.  [name] HAND SURGERY    . [place] & 2011  . TEE WITHOUT CARDIOVERSION  07/07/2012   Procedure: TRANSESOPHAGEAL ECHOCARDIOGRAM (TEE);  Surgeon: Pixie Casino, MD;  Location: [facility] ENDOSCOPY;  Service: Cardiovascular;  Laterality: N/A;  . TEE WITHOUT CARDIOVERSION N/A 08/02/2018   Procedure: TRANSESOPHAGEAL ECHOCARDIOGRAM (TEE);  Surgeon: Lelon Perla, MD;  Location: [facility] ENDOSCOPY;  Service: Cardiovascular;  Laterality: N/A;    ROS- all systems are reviewed and negatives except as per HPI above  Current Outpatient Medications  Medication Sig Dispense Refill  . amiodarone (PACERONE) 200 MG tablet Take 1 tablet (200 mg total) by mouth daily. 90 tablet 3  . Cholecalciferol (VITAMIN D3) 2000 units TABS Take 2,000 Units by mouth daily.    [name] doxazosin (CARDURA) 8 MG tablet Take 1 tablet (8 mg total) by mouth at bedtime. 90 tablet 3  . furosemide (LASIX) 20 MG tablet Take 20 mg by mouth daily as needed for fluid.     [name] lisinopril (PRINIVIL,ZESTRIL) 20 MG  tablet Take 20 mg by mouth as needed.    . Multiple Vitamins-Minerals (ICAPS) CAPS Take 1 capsule by mouth every other day.    . Multiple Vitamins-Minerals (PRESERVISION AREDS 2 PO) Take 1 tablet by mouth every other day.    . OMEGA-3 KRILL OIL PO Take 1 capsule by mouth at bedtime.     [name] OVER THE COUNTER MEDICATION Apply 1 application topically daily as needed (MagnaLife Pain Relieving Foot Cream).    . polyethylene glycol (MIRALAX / GLYCOLAX) packet Take 17 g by mouth daily as needed for moderate constipation.    [name] rOPINIRole (REQUIP) 0.25 MG  tablet Take 0.25 mg by mouth at bedtime.     . simvastatin (ZOCOR) 5 MG tablet Take 5 mg by mouth daily.     [name] triamcinolone cream (KENALOG) 0.1 % Apply 1 application topically 2 (two) times daily as needed (SKIN RASHES).     [name] 20 MG TABS tablet TAKE 1 TABLET EVERY DAY WITH SUPPER (Patient taking differently: Take 20 mg by mouth daily. ) 90 tablet 1   No current facility-administered medications for this visit.     Physical Exam: Vitals:   08/24/18 0832  BP: 112/68  Pulse: 74  SpO2: 97%  Weight: 193 lb 12.8 oz (87.9 kg)  Height: 6' (1.829 m)    GEN- The patient is well appearing, alert and oriented x 3 today.   Head- normocephalic, atraumatic Eyes-  Sclera clear, conjunctiva pink Ears- hearing intact Oropharynx- clear Lungs- Clear to ausculation bilaterally, normal work of breathing Heart- irregular rate and rhythm, no murmurs, rubs or gallops, PMI not laterally displaced GI- soft, NT, ND, + BS Extremities- no clubbing, cyanosis, or edema  Wt Readings from Last 3 Encounters:  08/24/18 193 lb 12.8 oz (87.9 kg)  08/09/18 199 lb (90.3 kg)  07/25/18 196 lb 3.2 oz (89 kg)    EKG tracing ordered today is personally reviewed and shows afib, V rate 74 bpm, PVCs, nonspecific ST/T changes  Assessment and Plan:  1. Persistent afib/ typical atrial flutter  Severe LA enlargement noted on echo 08/18/18 Continue rate control Continue amiodarone for now he has failed medical therapy with amiodarone. Chads2vasc score is 3.  he is anticoagulated with xarelto . Therapeutic strategies for afib including medicine and ablation were discussed in detail with the patient today. Risk, benefits, and alternatives to EP study and radiofrequency ablation for afib were also discussed in detail today. These risks include but are not limited to stroke, bleeding, vascular damage, tamponade, perforation, damage to the esophagus, lungs, and other structures, pulmonary vein stenosis, worsening renal  function, and death. The patient understands these risk and wishes to proceed.  We will therefore proceed with catheter ablation at the next available time.  Carto, ICE, anesthesia are requested for the procedure.  Will also obtain cardiac CT prior to the procedure to exclude LAA thrombus and further evaluate atrial anatomy.  Given SOB, will order CT with FFR to evaluate for CAD also.  2. HTN Stable No change required today  Thompson Grayer MD, [facility] 08/24/2018 8:38 AM

## 2018-08-25 DIAGNOSIS — Z7901 Long term (current) use of anticoagulants: Secondary | ICD-10-CM | POA: Diagnosis not present

## 2018-08-25 DIAGNOSIS — I10 Essential (primary) hypertension: Secondary | ICD-10-CM | POA: Diagnosis not present

## 2018-08-25 DIAGNOSIS — I4891 Unspecified atrial fibrillation: Secondary | ICD-10-CM | POA: Diagnosis not present

## 2018-08-26 NOTE — Addendum Note (Signed)
Addended by: Rose Phi on: 08/26/2018 09:27 AM   Modules accepted: Orders

## 2018-08-30 DIAGNOSIS — H02103 Unspecified ectropion of right eye, unspecified eyelid: Secondary | ICD-10-CM | POA: Diagnosis not present

## 2018-08-30 DIAGNOSIS — Z961 Presence of intraocular lens: Secondary | ICD-10-CM | POA: Diagnosis not present

## 2018-08-30 DIAGNOSIS — H35033 Hypertensive retinopathy, bilateral: Secondary | ICD-10-CM | POA: Diagnosis not present

## 2018-08-30 DIAGNOSIS — H353131 Nonexudative age-related macular degeneration, bilateral, early dry stage: Secondary | ICD-10-CM | POA: Diagnosis not present

## 2018-08-30 DIAGNOSIS — H01003 Unspecified blepharitis right eye, unspecified eyelid: Secondary | ICD-10-CM | POA: Diagnosis not present

## 2018-09-14 ENCOUNTER — Telehealth: Payer: Self-pay | Admitting: Internal Medicine

## 2018-09-14 ENCOUNTER — Ambulatory Visit (HOSPITAL_COMMUNITY)
Admission: RE | Admit: 2018-09-14 | Discharge: 2018-09-14 | Disposition: A | Discharge status: Home / Self Care | Payer: Medicare HMO | Source: Ambulatory Visit | Attending: Internal Medicine | Admitting: Internal Medicine

## 2018-09-14 DIAGNOSIS — I700 Atherosclerosis of aorta: Secondary | ICD-10-CM | POA: Diagnosis not present

## 2018-09-14 DIAGNOSIS — R0602 Shortness of breath: Secondary | ICD-10-CM

## 2018-09-14 DIAGNOSIS — I480 Paroxysmal atrial fibrillation: Secondary | ICD-10-CM

## 2018-09-14 DIAGNOSIS — I712 Thoracic aortic aneurysm, without rupture: Secondary | ICD-10-CM | POA: Insufficient documentation

## 2018-09-14 DIAGNOSIS — J984 Other disorders of lung: Secondary | ICD-10-CM | POA: Diagnosis not present

## 2018-09-14 DIAGNOSIS — R0609 Other forms of dyspnea: Secondary | ICD-10-CM | POA: Diagnosis not present

## 2018-09-14 DIAGNOSIS — I4819 Other persistent atrial fibrillation: Secondary | ICD-10-CM | POA: Diagnosis not present

## 2018-09-14 MED ORDER — IOPAMIDOL (ISOVUE-370) INJECTION 76%
100.0000 mL | Freq: Once | INTRAVENOUS | Status: AC | PRN
  Administered 2018-09-14: 80 mL via INTRAVENOUS

## 2018-09-14 NOTE — Telephone Encounter (Signed)
Returned call to Pt.  Reiterated instruction from last [place]:  Please arrive at the [facility] main entrance of [place] hospital at: 7:30 am on September 20, 2018 Do not eat or drink after midnight prior to procedure On the morning of your procedure do not take any medications  Pt indicates understanding.

## 2018-09-14 NOTE — Telephone Encounter (Signed)
° ° °  Patient calling for instructions for procedure scheduled 11/19

## 2018-09-20 ENCOUNTER — Encounter (HOSPITAL_COMMUNITY): Payer: Self-pay | Admitting: Anesthesiology

## 2018-09-20 ENCOUNTER — Other Ambulatory Visit: Payer: Self-pay

## 2018-09-20 ENCOUNTER — Ambulatory Visit (HOSPITAL_COMMUNITY): Payer: Medicare HMO | Admitting: Anesthesiology

## 2018-09-20 ENCOUNTER — Ambulatory Visit: Payer: Medicare HMO | Admitting: Cardiovascular Disease

## 2018-09-20 ENCOUNTER — Ambulatory Visit (HOSPITAL_COMMUNITY)
Admission: RE | Admit: 2018-09-20 | Discharge: 2018-09-21 | Disposition: A | Discharge status: Home / Self Care | Payer: Medicare HMO | Source: Ambulatory Visit | Attending: Internal Medicine | Admitting: Internal Medicine

## 2018-09-20 ENCOUNTER — Encounter (HOSPITAL_COMMUNITY)
Admission: RE | Disposition: A | Discharge status: Home / Self Care | Payer: Self-pay | Source: Ambulatory Visit | Attending: Internal Medicine

## 2018-09-20 DIAGNOSIS — I4892 Unspecified atrial flutter: Secondary | ICD-10-CM | POA: Diagnosis not present

## 2018-09-20 DIAGNOSIS — I483 Typical atrial flutter: Secondary | ICD-10-CM | POA: Insufficient documentation

## 2018-09-20 DIAGNOSIS — I4891 Unspecified atrial fibrillation: Secondary | ICD-10-CM | POA: Diagnosis not present

## 2018-09-20 DIAGNOSIS — Z79899 Other long term (current) drug therapy: Secondary | ICD-10-CM | POA: Diagnosis not present

## 2018-09-20 DIAGNOSIS — I10 Essential (primary) hypertension: Secondary | ICD-10-CM | POA: Insufficient documentation

## 2018-09-20 DIAGNOSIS — Z7901 Long term (current) use of anticoagulants: Secondary | ICD-10-CM | POA: Diagnosis not present

## 2018-09-20 DIAGNOSIS — Z9889 Other specified postprocedural states: Secondary | ICD-10-CM | POA: Diagnosis not present

## 2018-09-20 DIAGNOSIS — N400 Enlarged prostate without lower urinary tract symptoms: Secondary | ICD-10-CM | POA: Insufficient documentation

## 2018-09-20 DIAGNOSIS — I4819 Other persistent atrial fibrillation: Secondary | ICD-10-CM | POA: Diagnosis present

## 2018-09-20 DIAGNOSIS — E785 Hyperlipidemia, unspecified: Secondary | ICD-10-CM | POA: Diagnosis not present

## 2018-09-20 DIAGNOSIS — Z85828 Personal history of other malignant neoplasm of skin: Secondary | ICD-10-CM | POA: Insufficient documentation

## 2018-09-20 DIAGNOSIS — E559 Vitamin D deficiency, unspecified: Secondary | ICD-10-CM | POA: Insufficient documentation

## 2018-09-20 DIAGNOSIS — Z955 Presence of coronary angioplasty implant and graft: Secondary | ICD-10-CM | POA: Insufficient documentation

## 2018-09-20 DIAGNOSIS — G629 Polyneuropathy, unspecified: Secondary | ICD-10-CM | POA: Insufficient documentation

## 2018-09-20 HISTORY — PX: ATRIAL FIBRILLATION ABLATION: EP1191

## 2018-09-20 LAB — POCT ACTIVATED CLOTTING TIME
ACTIVATED CLOTTING TIME: 334 s
ACTIVATED CLOTTING TIME: 345 s
ACTIVATED CLOTTING TIME: 202 s
Activated Clotting Time: 274 seconds
Activated Clotting Time: 340 seconds

## 2018-09-20 SURGERY — ATRIAL FIBRILLATION ABLATION
Anesthesia: General

## 2018-09-20 MED ORDER — SODIUM CHLORIDE 0.9 % IV SOLN
INTRAVENOUS | Status: DC | PRN
  Administered 2018-09-20: 15 ug/min via INTRAVENOUS

## 2018-09-20 MED ORDER — BUPIVACAINE HCL (PF) 0.25 % IJ SOLN
INTRAMUSCULAR | Status: AC
  Filled 2018-09-20: qty 30

## 2018-09-20 MED ORDER — LISINOPRIL 20 MG PO TABS: 20.0000 mg | ORAL_TABLET | Freq: Every day | ORAL | Status: DC

## 2018-09-20 MED ORDER — PHENOL 1.4 % MT LIQD
1.0000 | OROMUCOSAL | Status: DC | PRN
  Administered 2018-09-20: 1 via OROMUCOSAL
  Filled 2018-09-20: qty 177

## 2018-09-20 MED ORDER — HEPARIN SODIUM (PORCINE) 1000 UNIT/ML IJ SOLN
INTRAMUSCULAR | Status: AC
  Filled 2018-09-20: qty 1

## 2018-09-20 MED ORDER — HEPARIN (PORCINE) IN NACL 1000-0.9 UT/500ML-% IV SOLN
INTRAVENOUS | Status: DC | PRN
  Administered 2018-09-20: 500 mL

## 2018-09-20 MED ORDER — EPHEDRINE SULFATE-NACL 50-0.9 MG/10ML-% IV SOSY
PREFILLED_SYRINGE | INTRAVENOUS | Status: DC | PRN
  Administered 2018-09-20 (×4): 5 mg via INTRAVENOUS

## 2018-09-20 MED ORDER — OFF THE BEAT BOOK
Freq: Once | Status: AC
  Administered 2018-09-20: 22:00:00
  Filled 2018-09-20: qty 1

## 2018-09-20 MED ORDER — LIDOCAINE 2% (20 MG/ML) 5 ML SYRINGE
INTRAMUSCULAR | Status: DC | PRN
  Administered 2018-09-20: 60 mg via INTRAVENOUS

## 2018-09-20 MED ORDER — FUROSEMIDE 20 MG PO TABS
20.0000 mg | ORAL_TABLET | Freq: Every day | ORAL | Status: DC
  Administered 2018-09-20: 18:00:00 20 mg via ORAL
  Filled 2018-09-20: qty 1

## 2018-09-20 MED ORDER — PROPOFOL 10 MG/ML IV BOLUS
INTRAVENOUS | Status: DC | PRN
  Administered 2018-09-20: 150 mg via INTRAVENOUS

## 2018-09-20 MED ORDER — FUROSEMIDE 20 MG PO TABS: 20.0000 mg | ORAL_TABLET | Freq: Every day | ORAL | Status: DC

## 2018-09-20 MED ORDER — ROPINIROLE HCL 0.25 MG PO TABS
0.2500 mg | ORAL_TABLET | Freq: Every day | ORAL | Status: DC
  Administered 2018-09-20: 22:00:00 0.25 mg via ORAL
  Filled 2018-09-20: qty 1

## 2018-09-20 MED ORDER — HYDROCODONE-ACETAMINOPHEN 5-325 MG PO TABS
ORAL_TABLET | ORAL | Status: AC
  Filled 2018-09-20: qty 1

## 2018-09-20 MED ORDER — SODIUM CHLORIDE 0.9% FLUSH
3.0000 mL | Freq: Two times a day (BID) | INTRAVENOUS | Status: DC
  Administered 2018-09-20 – 2018-09-21 (×2): 3 mL via INTRAVENOUS

## 2018-09-20 MED ORDER — LISINOPRIL 20 MG PO TABS
20.0000 mg | ORAL_TABLET | Freq: Every day | ORAL | Status: DC
  Administered 2018-09-20 – 2018-09-21 (×2): 20 mg via ORAL
  Filled 2018-09-20: qty 2
  Filled 2018-09-20 (×2): qty 1
  Filled 2018-09-20: qty 2

## 2018-09-20 MED ORDER — ACETAMINOPHEN 325 MG PO TABS: 650.0000 mg | ORAL_TABLET | ORAL | Status: DC | PRN

## 2018-09-20 MED ORDER — FENTANYL CITRATE (PF) 100 MCG/2ML IJ SOLN
INTRAMUSCULAR | Status: DC | PRN
  Administered 2018-09-20: 50 ug via INTRAVENOUS

## 2018-09-20 MED ORDER — SODIUM CHLORIDE 0.9% FLUSH: 3.0000 mL | INTRAVENOUS | Status: DC | PRN

## 2018-09-20 MED ORDER — SODIUM CHLORIDE 0.9 % IV SOLN: 250.0000 mL | INTRAVENOUS | Status: DC | PRN

## 2018-09-20 MED ORDER — ONDANSETRON HCL 4 MG/2ML IJ SOLN
INTRAMUSCULAR | Status: DC | PRN
  Administered 2018-09-20: 4 mg via INTRAVENOUS

## 2018-09-20 MED ORDER — SUGAMMADEX SODIUM 200 MG/2ML IV SOLN
INTRAVENOUS | Status: DC | PRN
  Administered 2018-09-20: 200 mg via INTRAVENOUS

## 2018-09-20 MED ORDER — DOXAZOSIN MESYLATE 8 MG PO TABS
8.0000 mg | ORAL_TABLET | Freq: Every day | ORAL | Status: DC
  Administered 2018-09-20: 22:00:00 8 mg via ORAL
  Filled 2018-09-20: qty 1

## 2018-09-20 MED ORDER — ONDANSETRON HCL 4 MG/2ML IJ SOLN: 4.0000 mg | Freq: Four times a day (QID) | INTRAMUSCULAR | Status: DC | PRN

## 2018-09-20 MED ORDER — ROCURONIUM BROMIDE 50 MG/5ML IV SOSY
PREFILLED_SYRINGE | INTRAVENOUS | Status: DC | PRN
  Administered 2018-09-20: 40 mg via INTRAVENOUS
  Administered 2018-09-20: 10 mg via INTRAVENOUS

## 2018-09-20 MED ORDER — HYDROCODONE-ACETAMINOPHEN 5-325 MG PO TABS
1.0000 | ORAL_TABLET | ORAL | Status: DC | PRN
  Administered 2018-09-20 (×2): 1 via ORAL
  Administered 2018-09-21 (×2): 2 via ORAL
  Filled 2018-09-20 (×2): qty 2

## 2018-09-20 MED ORDER — HEPARIN SODIUM (PORCINE) 1000 UNIT/ML IJ SOLN
INTRAMUSCULAR | Status: DC | PRN
  Administered 2018-09-20: 12000 [IU] via INTRAVENOUS
  Administered 2018-09-20: 1000 [IU] via INTRAVENOUS

## 2018-09-20 MED ORDER — SODIUM CHLORIDE 0.9 % IV SOLN
INTRAVENOUS | Status: DC
  Administered 2018-09-20: 08:00:00 via INTRAVENOUS

## 2018-09-20 MED ORDER — PROTAMINE SULFATE 10 MG/ML IV SOLN
INTRAVENOUS | Status: DC | PRN
  Administered 2018-09-20: 40 mg via INTRAVENOUS

## 2018-09-20 MED ORDER — BUPIVACAINE HCL (PF) 0.25 % IJ SOLN
INTRAMUSCULAR | Status: DC | PRN
  Administered 2018-09-20: 30 mL

## 2018-09-20 MED ORDER — RIVAROXABAN 20 MG PO TABS
20.0000 mg | ORAL_TABLET | Freq: Every day | ORAL | Status: DC
  Administered 2018-09-20: 18:00:00 20 mg via ORAL
  Filled 2018-09-20: qty 1

## 2018-09-20 MED ORDER — HEPARIN SODIUM (PORCINE) 1000 UNIT/ML IJ SOLN
INTRAMUSCULAR | Status: DC | PRN
  Administered 2018-09-20: 5000 [IU] via INTRAVENOUS
  Administered 2018-09-20 (×2): 2000 [IU] via INTRAVENOUS
  Administered 2018-09-20: 1000 [IU] via INTRAVENOUS

## 2018-09-20 SURGICAL SUPPLY — 18 items
BLANKET WARM UNDERBOD FULL ACC (MISCELLANEOUS) ×1 IMPLANT
CATH MAPPNG PENTARAY F 2-6-2MM (CATHETERS) IMPLANT
CATH NAVISTAR SMARTTOUCH DF (ABLATOR) ×1 IMPLANT
CATH SOUNDSTAR 3D IMAGING (CATHETERS) ×1 IMPLANT
CATH WEBSTER BI DIR CS D-F CRV (CATHETERS) ×1 IMPLANT
COVER SWIFTLINK CONNECTOR (BAG) ×1 IMPLANT
NDL BAYLIS TRANSSEPTAL 71CM (NEEDLE) IMPLANT
NEEDLE BAYLIS TRANSSEPTAL 71CM (NEEDLE) ×2 IMPLANT
PACK EP LATEX FREE (CUSTOM PROCEDURE TRAY) ×2
PACK EP LF (CUSTOM PROCEDURE TRAY) ×1 IMPLANT
PAD PRO RADIOLUCENT 2001M-C (PAD) ×2 IMPLANT
PATCH CARTO3 (PAD) ×1 IMPLANT
PENTARAY F 2-6-2MM (CATHETERS) ×2
SHEATH AVANTI 11F 11CM (SHEATH) ×1 IMPLANT
SHEATH PINNACLE 7F 10CM (SHEATH) ×2 IMPLANT
SHEATH PINNACLE 9F 10CM (SHEATH) ×1 IMPLANT
SHEATH SWARTZ TS SL2 63CM 8.5F (SHEATH) ×1 IMPLANT
TUBING SMART ABLATE COOLFLOW (TUBING) ×1 IMPLANT

## 2018-09-20 NOTE — Transfer of Care (Signed)
Immediate Anesthesia Transfer of Care Note  Patient: Stanley Waters  Procedure(s) Performed: ATRIAL FIBRILLATION ABLATION (N/A )  Patient Location: Cath Lab  Anesthesia Type:General  Level of Consciousness: awake, alert  and oriented  Airway & Oxygen Therapy: Patient Spontanous Breathing and Patient connected to nasal cannula oxygen  Post-op Assessment: Report given to RN, Post -op Vital signs reviewed and stable and Patient moving all extremities  Post vital signs: Reviewed and stable  Last Vitals:  Vitals Value Taken Time  BP 169/80 09/20/2018  1:10 PM  Temp 36.5 C 09/20/2018  1:00 PM  Pulse 66 09/20/2018  1:11 PM  Resp 12 09/20/2018  1:11 PM  SpO2 97 % 09/20/2018  1:11 PM  Vitals shown include unvalidated device data.  Last Pain:  Vitals:   09/20/18 1300  TempSrc: Tympanic  PainSc: 0-No pain         Complications: No apparent anesthesia complications

## 2018-09-20 NOTE — Anesthesia Preprocedure Evaluation (Addendum)
Anesthesia Evaluation  Patient identified by MRN, date of birth, ID band Patient awake    Reviewed: Allergy & Precautions, NPO status , Patient's Chart, lab work & pertinent test results  Airway Mallampati: III  TM Distance: >3 FB Neck ROM: Full    Dental  (+) Dental Advisory Given, Teeth Intact, Chipped,    Pulmonary former smoker,    breath sounds clear to auscultation       Cardiovascular hypertension, Pt. on medications + dysrhythmias Atrial Fibrillation  Rhythm:Regular Rate:Normal  08/02/18 TEE: Normal LV function; moderate LAE; no LAA thrombus; mild MR and  TR.   Neuro/Psych  Neuromuscular disease    GI/Hepatic negative GI ROS, Neg liver ROS,   Endo/Other  negative endocrine ROS  Renal/GU negative Renal ROS     Musculoskeletal   Abdominal   Peds  Hematology negative hematology ROS (+)   Anesthesia Other Findings   Reproductive/Obstetrics                          Lab Results  Component Value Date   WBC 5.4 08/24/2018   HGB 15.1 08/24/2018   HCT 43.2 08/24/2018   MCV 93 08/24/2018   PLT 161 08/24/2018   Lab Results  Component Value Date   CREATININE 1.14 08/24/2018   BUN 17 08/24/2018   NA 143 08/24/2018   K 3.6 08/24/2018   CL 104 08/24/2018   CO2 24 08/24/2018    Anesthesia Physical Anesthesia Plan  ASA: III  Anesthesia Plan: General   Post-op Pain Management:    Induction: Intravenous  PONV Risk Score and Plan: 2 and Dexamethasone, Ondansetron and Treatment may vary due to age or medical condition  Airway Management Planned: Oral ETT  Additional Equipment:   Intra-op Plan:   Post-operative Plan: Extubation in OR  Informed Consent: I have reviewed the patients History and Physical, chart, labs and discussed the procedure including the risks, benefits and alternatives for the proposed anesthesia with the patient or authorized representative who has indicated  his/her understanding and acceptance.   Dental advisory given  Plan Discussed with: CRNA  Anesthesia Plan Comments:         Anesthesia Quick Evaluation

## 2018-09-20 NOTE — Progress Notes (Addendum)
Site Area: Right groin Site Prior to Removal: Level 0  Pressure Applied For:  20   minutes Manual: yes Patient Status During Pull: stable Post Pull Site: Level 0 Post Pull Instructions Given: YES Post Pull Pulses Present: palpable Dressing Applied: gauze and tegaderm  Bedrest begins @ 1440 till  2040 Comments:  Removed by  Carl Best, RN

## 2018-09-20 NOTE — Interval H&P Note (Signed)
History and Physical Interval Note:  09/20/2018 9:20 AM  Stanley Waters  has presented today for surgery, with the diagnosis of Afib  The various methods of treatment have been discussed with the patient and family. After consideration of risks, benefits and other options for treatment, the patient has consented to  Procedure(s): ATRIAL FIBRILLATION ABLATION (N/A) as a surgical intervention .  The patient's history has been reviewed, patient examined, no change in status, stable for surgery.  I have reviewed the patient's chart and labs.  Questions were answered to the patient's satisfaction.    Reports compliance with xarelto without interruption. Cardiac CT results reviewed with patient.  He is encouraged to have annual CT of aorta with Dr [name].   Thompson Grayer MD, [facility] 09/20/2018 9:21 AM

## 2018-09-20 NOTE — Discharge Summary (Signed)
ELECTROPHYSIOLOGY PROCEDURE DISCHARGE SUMMARY    Patient ID: Stanley Waters,  MRN: [id number], DOB/AGE: 81-May-1936 81 y.o.  Admit date: 09/20/2018 Discharge date: 09/21/2018  Primary Care Physician: Deland Pretty, MD Primary Cardiologist: [name] Electrophysiologist: Thompson Grayer, MD  Primary Discharge Diagnosis:  Persistent atrial fibrillation and atrial flutter status post ablation this admission  Secondary Discharge Diagnosis:  1.  HTN 2.  Hyperlipidemia  Procedures This Admission:  1.  Electrophysiology study and radiofrequency catheter ablation on 09/20/18 by Dr Thompson Grayer.  This study demonstrated mitral annular clockwise atrial flutter upon presentation successfully ablated along the mitral annulus; intracardiac echo reveals a large sized left atrium; successful electrical isolation and anatomical encircling of all four pulmonary veins with radiofrequency current; additional mapping and ablation within the left atrium due to persistence of atrial fibrillation with a posterior wall box demonstrated; isthmus dependant right atrial flutter also observed and successfully ablated along the cavo-tricuspid isthmus with complete bidirectional isthmus block achieved; no inducible arrhythmias following ablation; no early apparent complications..    Brief HPI: Stanley Waters is a 83 y.o. male with a history of persistent atrial fibrillation and atrial flutter.  They have failed medical therapy with amiodarone. Risks, benefits, and alternatives to catheter ablation of atrial fibrillation were reviewed with the patient who wished to proceed.  The patient underwent cardiac CT prior to the procedure which demonstrated no LAA thrombus.    Hospital Course:  The patient was admitted and underwent EPS/RFCA of atrial fibrillation with details as outlined above.  They were monitored on telemetry overnight which demonstrated sinus rhythm.  Groin was without complication on the day of discharge.   The patient was examined and considered to be stable for discharge.  Wound care and restrictions were reviewed with the patient.  The patient will be seen back by Roderic Palau, NP in 4 weeks and Dr Rayann Heman in 12 weeks for post ablation follow up.   This patients CHA2DS2-VASc Score and unadjusted Ischemic Stroke Rate (% per year) is equal to 3.2 % stroke rate/year from a score of 3 Above score calculated as 1 point each if present [CHF, HTN, DM, Vascular=MI/PAD/Aortic Plaque, Age if 65-74, or Male] Above score calculated as 2 points each if present [Age > 75, or Stroke/TIA/TE]   Physical Exam: Vitals:   09/20/18 1651 09/20/18 1800 09/20/18 2000 09/21/18 0609  BP:  (!) 161/83 136/65 (!) 144/62  Pulse:   66 69  Resp:   17 12  Temp: 97.9 F (36.6 C)  98.3 F (36.8 C) 98.5 F (36.9 C)  TempSrc: Temporal  Oral Axillary  SpO2:   100% 93%  Weight:    88 kg  Height:        GEN- The patient is well appearing, alert and oriented x 3 today.   HEENT: normocephalic, atraumatic; sclera clear, conjunctiva pink; hearing intact; oropharynx clear; neck supple  Lungs- Clear to ausculation bilaterally, normal work of breathing.  No wheezes, rales, rhonchi Heart- Regular rate and rhythm, no murmurs, rubs or gallops  GI- soft, non-tender, non-distended, bowel sounds present  Extremities- no clubbing, cyanosis, or edema; DP/PT/radial pulses 2+ bilaterally, groin without hematoma/bruit MS- no significant deformity or atrophy Skin- warm and dry, no rash or lesion Psych- euthymic mood, full affect Neuro- strength and sensation are intact   Labs:   Lab Results  Component Value Date   WBC 5.4 08/24/2018   HGB 15.1 08/24/2018   HCT 43.2 08/24/2018   MCV 93 08/24/2018  PLT 161 08/24/2018   No results for input(s): NA, K, CL, CO2, BUN, CREATININE, CALCIUM, PROT, BILITOT, ALKPHOS, ALT, AST, GLUCOSE in the last 168 hours.  Invalid input(s): LABALBU   Discharge Medications:  Allergies as of  09/21/2018      Reactions   Prednisone Swelling, Other (See Comments), Hypertension   Tachycardia   Doxycycline Rash      Medication List    STOP taking these medications   amiodarone 200 MG tablet Commonly known as:  PACERONE   metoprolol tartrate 50 MG tablet Commonly known as:  LOPRESSOR     TAKE these medications   acetaminophen 500 MG tablet Commonly known as:  TYLENOL Take 1,000 mg by mouth daily as needed for moderate pain or headache.   doxazosin 8 MG tablet Commonly known as:  CARDURA Take 1 tablet (8 mg total) by mouth at bedtime.   Fish Oil 1000 MG Caps Take 1,000 mg by mouth at bedtime.   furosemide 20 MG tablet Commonly known as:  LASIX Take 20 mg by mouth daily as needed for fluid.   ICAPS Caps Take 1 capsule by mouth daily.   lisinopril 20 MG tablet Commonly known as:  PRINIVIL,ZESTRIL Take 20 mg by mouth daily as needed (if systolic bp is 711).   pantoprazole 40 MG tablet Commonly known as:  PROTONIX Take 1 tablet (40 mg total) by mouth daily.   polyethylene glycol packet Commonly known as:  MIRALAX / GLYCOLAX Take 17 g by mouth daily as needed for moderate constipation.   rOPINIRole 0.25 MG tablet Commonly known as:  REQUIP Take 0.25 mg by mouth at bedtime.   simvastatin 5 MG tablet Commonly known as:  ZOCOR Take 5 mg by mouth daily.   SYSTANE OP Place 1 drop into both eyes daily as needed (dry eyes).   triamcinolone cream 0.1 % Commonly known as:  KENALOG Apply 1 application topically 2 (two) times daily as needed (SKIN RASHES).   Vitamin D3 50 MCG (2000 UT) Tabs Take 2,000 Units by mouth daily.   XARELTO 20 MG Tabs tablet Generic drug:  rivaroxaban TAKE 1 TABLET EVERY DAY WITH SUPPER What changed:  See the new instructions.       Disposition:   Follow-up Information    [facility] Follow up on 10/17/2018.   Specialty:  Cardiology Why:  at 9:30AM Contact information: [address] [id number] [place] [phone]       Thompson Grayer, MD Follow up on 12/21/2018.   Specialty:  Cardiology Why:  at 9:30AM Contact information: [place] [address] [phone]           Duration of Discharge Encounter: Greater than 30 minutes including physician time.  Army Fossa MD 09/21/2018 7:57 AM

## 2018-09-20 NOTE — Anesthesia Procedure Notes (Signed)
Procedure Name: Intubation Date/Time: 09/20/2018 9:39 AM Performed by: Kyung Rudd, CRNA Pre-anesthesia Checklist: Patient identified, Emergency Drugs available, Suction available and Patient being monitored Patient Re-evaluated:Patient Re-evaluated prior to induction Oxygen Delivery Method: Circle system utilized Preoxygenation: Pre-oxygenation with 100% oxygen Induction Type: IV induction Ventilation: Mask ventilation without difficulty Laryngoscope Size: Glidescope and 4 (Glidescope Go used for intubation.) Tube type: Oral Tube size: 7.5 mm Number of attempts: 2 Airway Equipment and Method: Stylet Placement Confirmation: ETT inserted through vocal cords under direct vision,  positive ETCO2 and breath sounds checked- equal and bilateral Secured at: 23 cm Tube secured with: Tape Dental Injury: Teeth and Oropharynx as per pre-operative assessment and Injury to lip  Comments: DL x1 with MAC 4, unable to visualize vocal cords due to reduced neck mobility. 2nd DL with Glidescope Go, Grade 1 view on screen. Successful intubation on 2nd attempt.

## 2018-09-21 ENCOUNTER — Encounter (HOSPITAL_COMMUNITY): Payer: Self-pay | Admitting: Internal Medicine

## 2018-09-21 DIAGNOSIS — E785 Hyperlipidemia, unspecified: Secondary | ICD-10-CM | POA: Diagnosis not present

## 2018-09-21 DIAGNOSIS — G629 Polyneuropathy, unspecified: Secondary | ICD-10-CM | POA: Diagnosis not present

## 2018-09-21 DIAGNOSIS — I4819 Other persistent atrial fibrillation: Secondary | ICD-10-CM

## 2018-09-21 DIAGNOSIS — N400 Enlarged prostate without lower urinary tract symptoms: Secondary | ICD-10-CM | POA: Diagnosis not present

## 2018-09-21 DIAGNOSIS — I483 Typical atrial flutter: Secondary | ICD-10-CM | POA: Diagnosis not present

## 2018-09-21 DIAGNOSIS — Z79899 Other long term (current) drug therapy: Secondary | ICD-10-CM | POA: Diagnosis not present

## 2018-09-21 DIAGNOSIS — I10 Essential (primary) hypertension: Secondary | ICD-10-CM | POA: Diagnosis not present

## 2018-09-21 DIAGNOSIS — E559 Vitamin D deficiency, unspecified: Secondary | ICD-10-CM | POA: Diagnosis not present

## 2018-09-21 DIAGNOSIS — Z7901 Long term (current) use of anticoagulants: Secondary | ICD-10-CM | POA: Diagnosis not present

## 2018-09-21 MED ORDER — PANTOPRAZOLE SODIUM 40 MG PO TBEC: 40.0000 mg | DELAYED_RELEASE_TABLET | Freq: Every day | ORAL | 0 refills | Status: DC

## 2018-09-21 MED ORDER — FUROSEMIDE 10 MG/ML IJ SOLN
40.0000 mg | Freq: Once | INTRAMUSCULAR | Status: AC
  Administered 2018-09-21: 08:00:00 40 mg via INTRAVENOUS
  Filled 2018-09-21: qty 4

## 2018-09-21 NOTE — Progress Notes (Signed)
Pt and pt's son given all discharge instructions and verbalized understanding of all.  Pt is aware of prescription pick up at [facility] on [address].  Pt went home with throat spray.  All belongings with pt and pt transported via wc to son's car.

## 2018-09-21 NOTE — Discharge Instructions (Signed)
No driving for 4 days. No lifting over 5 lbs for 1 week. No sexual activity for 1 week. You may return to work in 1 week. Keep procedure site clean & dry. If you notice increased pain, swelling, bleeding or pus, call/return!  You may shower, but no soaking baths/hot tubs/pools for 1 week.   You have an appointment set up with the [facility].  Multiple studies have shown that being followed by a dedicated atrial fibrillation clinic in addition to the standard care you receive from your other physicians improves health. We believe that enrollment in the atrial fibrillation clinic will allow [place] to better care for you.   The phone number to the [facility] is [phone]. The clinic is staffed Monday through Friday from 8:30am to 5pm.  Parking Directions: The clinic is located in the Heart and Vascular Building connected to [facility]. 1)From [address] turn on to [address] and go to the 3rd entrance  (Heart and Vascular entrance) on the right. 2)Look to the right for Heart &Vascular Parking Garage. 3)A code for the entrance is required please call the clinic to receive this.   4)Take the elevators to the 1st floor. Registration is in the room with the glass walls at the end of the hallway.  If you have any trouble parking or locating the clinic, please dont hesitate to call [phone].  Information on my medicine - XARELTO (Rivaroxaban)  This medication education was reviewed with me or my healthcare representative as part of my discharge preparation.  The pharmacist that spoke with me during my hospital stay was:  Einar Grad, [facility]  Why was Xarelto prescribed for you? Xarelto was prescribed for you to reduce the risk of a blood clot forming that can cause a stroke if you have a medical condition called atrial fibrillation (a type of irregular heartbeat).  What do you need to know about xarelto ? Take your Xarelto ONCE DAILY at the  same time every day with your evening meal. If you have difficulty swallowing the tablet whole, you may crush it and mix in applesauce just prior to taking your dose.  Take Xarelto exactly as prescribed by your doctor and DO NOT stop taking Xarelto without talking to the doctor who prescribed the medication.  Stopping without other stroke prevention medication to take the place of Xarelto may increase your risk of developing a clot that causes a stroke.  Refill your prescription before you run out.  After discharge, you should have regular check-up appointments with your healthcare provider that is prescribing your Xarelto.  In the future your dose may need to be changed if your kidney function or weight changes by a significant amount.  What do you do if you miss a dose? If you are taking Xarelto ONCE DAILY and you miss a dose, take it as soon as you remember on the same day then continue your regularly scheduled once daily regimen the next day. Do not take two doses of Xarelto at the same time or on the same day.   Important Safety Information A possible side effect of Xarelto is bleeding. You should call your healthcare provider right away if you experience any of the following: ? Bleeding from an injury or your nose that does not stop. ? Unusual colored urine (red or dark brown) or unusual colored stools (red or black). ? Unusual bruising for unknown reasons. ? A serious fall or if you hit your head (even  if there is no bleeding).  Some medicines may interact with Xarelto and might increase your risk of bleeding while on Xarelto. To help avoid this, consult your healthcare provider or pharmacist prior to using any new prescription or non-prescription medications, including herbals, vitamins, non-steroidal anti-inflammatory drugs (NSAIDs) and supplements.  This website has more information on Xarelto: [email or web address]

## 2018-09-21 NOTE — Anesthesia Postprocedure Evaluation (Signed)
Anesthesia Post Note  Patient: Stanley Waters  Procedure(s) Performed: ATRIAL FIBRILLATION ABLATION (N/A )     Patient location during evaluation: PACU Anesthesia Type: General Level of consciousness: awake and alert Pain management: pain level controlled Vital Signs Assessment: post-procedure vital signs reviewed and stable Respiratory status: spontaneous breathing, nonlabored ventilation, respiratory function stable and patient connected to nasal cannula oxygen Cardiovascular status: blood pressure returned to baseline and stable Postop Assessment: no apparent nausea or vomiting Anesthetic complications: no    Last Vitals:  Vitals:   09/21/18 0609 09/21/18 0705  BP: (!) 144/62   Pulse: 69 77  Resp: 12   Temp: 36.9 C 37.1 C  SpO2: 93% 92%    Last Pain:  Vitals:   09/21/18 0932  TempSrc:   PainSc: 5                  Tiajuana Amass

## 2018-10-17 ENCOUNTER — Ambulatory Visit (HOSPITAL_COMMUNITY): Payer: Medicare HMO | Admitting: Nurse Practitioner

## 2018-11-18 DIAGNOSIS — B9789 Other viral agents as the cause of diseases classified elsewhere: Secondary | ICD-10-CM | POA: Diagnosis not present

## 2018-11-18 DIAGNOSIS — J069 Acute upper respiratory infection, unspecified: Secondary | ICD-10-CM | POA: Diagnosis not present

## 2018-11-23 ENCOUNTER — Telehealth: Payer: Self-pay | Admitting: Internal Medicine

## 2018-11-23 NOTE — Telephone Encounter (Signed)
New message       Patient c/o Palpitations:  High priority if patient c/o lightheadedness, shortness of breath, or chest pain  1) How long have you had palpitations/irregular HR/ Afib? Are you having the symptoms now? Today   2) Are you currently experiencing lightheadedness, SOB or CP? no  3) Do you have a history of afib (atrial fibrillation) or irregular heart rhythm? Yes   4) Have you checked your BP or HR? (document readings if available): 125/57 hr 77 this morning   5) Are you experiencing any other symptoms? No

## 2018-11-23 NOTE — Telephone Encounter (Signed)
Spoke with the patient, he stated that his Afib ablation was on 1/19 and he stated that since about 2 or 3 days ago he has felt the irregular heart rhythm return. He stated it occurs constantly and he also feels fatigued, the symptoms are similar to what he felt before his ablation, but not as severe. While on the phone his blood pressure was 155/87 and HR: 79. He denies chest pain or SOB. Spoke with Anderson Malta, Dr. Jackalyn Lombard nurse, she advised the patient contact afib clinic to see what the next steps are. The patient stated he would rather wait to see what Dr. Rayann Heman recommends.

## 2018-11-24 NOTE — Telephone Encounter (Signed)
We expect episodes of AF during the first 3 months post ablation as the heart heals.  This does not mean that the procedure was not successful.  I spend quite a bit of time telling patients to expect this post ablation.  I would advise that we reassure him.  If he is still having the symptoms, then he should come by our office for an ekg.  If still in AF then we could schedule a cardioversion for him.  A 30 day monitor would NOT be beneficial in this setting.

## 2018-11-25 NOTE — Telephone Encounter (Signed)
Returned call to Pt.  Advised intermittent afib in the healing period after ablation is common.  Advised if Pt was symptomatic with this afib to call afib clinic for someone to answer his call and immediately address his concerns.  Pt indicates understanding.  Pt states he has phone number to afib clinic.  Pt thanked nurse for call and information.

## 2018-12-21 ENCOUNTER — Encounter: Payer: Self-pay | Admitting: Internal Medicine

## 2018-12-21 ENCOUNTER — Ambulatory Visit: Payer: PPO | Admitting: Internal Medicine

## 2018-12-21 VITALS — BP 142/76 | HR 64 | Ht 72.0 in | Wt 198.2 lb

## 2018-12-21 DIAGNOSIS — I480 Paroxysmal atrial fibrillation: Secondary | ICD-10-CM

## 2018-12-21 DIAGNOSIS — I10 Essential (primary) hypertension: Secondary | ICD-10-CM | POA: Diagnosis not present

## 2018-12-21 MED ORDER — RIVAROXABAN 20 MG PO TABS: 20.0000 mg | ORAL_TABLET | Freq: Every day | ORAL | 2 refills | Status: DC

## 2018-12-21 MED ORDER — NITROGLYCERIN 0.4 MG SL SUBL: 0.4000 mg | SUBLINGUAL_TABLET | SUBLINGUAL | 1 refills | Status: DC | PRN

## 2018-12-21 NOTE — Progress Notes (Signed)
PCP: Stanley Pretty, MD Primary Cardiologist: Stanley Waters is a 84 y.o. adult who presents today for routine electrophysiology followup.  Since his recent afib ablation, the patient reports doing very well.  she denies procedure related complications and is pleased with the results of the procedure.  Today, she denies symptoms of palpitations, chest pain, shortness of breath,  lower extremity edema, dizziness, presyncope, or syncope.  He hid have nonexertional chest pressure about 2 weeks ago, lasting 30 minutes, none since.  The patient is otherwise without complaint today.   Past Medical History:  Diagnosis Date  . Atrial flutter ([place])   . Atypical chest pain    Myoview performed 07/23/11 was completely normal. Post stress EF 61%.  . Bruit    Left asymptomatic bruit. Carotid Duplex 12/13/12 =mildly abnormal. *BILATERAL BULB/PROXIMAL ICAs: Demonstrated a mild amount of fibrous plaque w/no evidence od significant diameter reduction, tortuosity or other vascular abnormality.  . Enlarged prostate    BPH - PSA of 6.7 this is consistant with his last PSA of 6.3  . Hyperlipemia   . Hypertension   . Inguinal hernia    Inguinal hernia repair (x) 2 1991, 2011  . Intestinal polyps 09/22/11   Colonoscopy removed a 2 mm sessile cecal polyp with a cold snare.  . Measles   . Mumps   . Normal coronary arteries 2002  . Peripheral neuropathy   . Persistent atrial fibrillation   . Seasonal allergies   . Skin cancer   . Thrombocytopenia ([place]) 2011   Very mild with platelets of 135,000.  [name] Tinnitus   . Umbilical hernia   . Vitamin D deficiency    Past Surgical History:  Procedure Laterality Date  . ATRIAL FIBRILLATION ABLATION  09/20/2018  . ATRIAL FIBRILLATION ABLATION N/A 09/20/2018   Procedure: ATRIAL FIBRILLATION ABLATION;  Surgeon: Stanley Grayer, MD;  Location: [facility];  Service: Cardiovascular;  Laterality: N/A;  . CARDIAC CATHETERIZATION  2002   "Normal cardiac  catheterization"  . CARDIOVERSION  07/07/2012   A fib - Cardioversion successful.  [name] CARDIOVERSION N/A 06/30/2013   Procedure: CARDIOVERSION;  Surgeon: Stanley Klein, MD;  Location: [facility];  Service: Cardiovascular;  Laterality: N/A;  . CARDIOVERSION N/A 08/02/2018   Procedure: CARDIOVERSION;  Surgeon: Stanley Perla, MD;  Location[facility] ENDOSCOPY;  Service: Cardiovascular;  Laterality: N/A;  . Carotid Duplex  12/13/12   For asymptomatic bruit. Mildly abnormal.  *BILATERAL BULB/PROXIMAL ICAs: Demonstrated a mild amount of fibrous plaque w/no evidence of significant diameter reduction, tortuosity or other vascular abnormality.   . COLONOSCOPY W/ POLYPECTOMY  09/22/11   2 mm sessile cecal polyp was removed with a cold snare.  [name] HAND SURGERY    . Real & 2011  . TEE WITHOUT CARDIOVERSION  07/07/2012   Procedure: TRANSESOPHAGEAL ECHOCARDIOGRAM (TEE);  Surgeon: Stanley Casino, MD;  Location: [facility] ENDOSCOPY;  Service: Cardiovascular;  Laterality: N/A;  . TEE WITHOUT CARDIOVERSION N/A 08/02/2018   Procedure: TRANSESOPHAGEAL ECHOCARDIOGRAM (TEE);  Surgeon: Stanley Perla, MD;  Location: [facility] ENDOSCOPY;  Service: Cardiovascular;  Laterality: N/A;    ROS- all systems are personally reviewed and negatives except as per HPI above  Current Outpatient Medications  Medication Sig Dispense Refill  . acetaminophen (TYLENOL) 500 MG tablet Take 1,000 mg by mouth daily as needed for moderate pain or headache.    . Cholecalciferol (VITAMIN D3) 2000 units TABS Take 2,000 Units by mouth daily.    [name]  doxazosin (CARDURA) 8 MG tablet Take 1 tablet (8 mg total) by mouth at bedtime. 90 tablet 3  . furosemide (LASIX) 20 MG tablet Take 20 mg by mouth daily as needed for fluid.     [name] lisinopril (PRINIVIL,ZESTRIL) 20 MG tablet Take 20 mg by mouth daily as needed (if systolic bp is 229).     . Multiple Vitamins-Minerals (ICAPS) CAPS Take 1 capsule by mouth daily.     . Omega-3 Fatty Acids (FISH OIL) 1000  MG CAPS Take 1,000 mg by mouth at bedtime.    Stanley Waters [name] (SYSTANE OP) Place 1 drop into both eyes daily as needed (dry eyes).    . polyethylene glycol (MIRALAX / GLYCOLAX) packet Take 17 g by mouth daily as needed for moderate constipation.    [name] rOPINIRole (REQUIP) 0.25 MG tablet Take 0.25 mg by mouth at bedtime.     . simvastatin (ZOCOR) 5 MG tablet Take 5 mg by mouth daily.     [name] triamcinolone cream (KENALOG) 0.1 % Apply 1 application topically 2 (two) times daily as needed (SKIN RASHES).     Stanley Waters 20 MG TABS tablet TAKE 1 TABLET EVERY DAY WITH SUPPER (Patient taking differently: Take 20 mg by mouth daily with supper. ) 90 tablet 1  . pantoprazole (PROTONIX) 40 MG tablet Take 1 tablet (40 mg total) by mouth daily. 45 tablet 0   No current facility-administered medications for this visit.     Physical Exam: Vitals:   12/21/18 0941  BP: (!) 142/76  Pulse: 64  SpO2: 99%  Weight: 198 lb 3.2 oz (89.9 kg)  Height: 6' (1.829 m)    GEN- The patient is well appearing, alert and oriented x 3 today.   Head- normocephalic, atraumatic Eyes-  Sclera clear, conjunctiva pink Ears- hearing intact Oropharynx- clear Lungs- Clear to ausculation bilaterally, normal work of breathing Heart- Regular rate and rhythm, no murmurs, rubs or gallops, PMI not laterally displaced GI- soft, NT, ND, + BS Extremities- no clubbing, cyanosis, or edema  EKG tracing ordered today is personally reviewed and shows sinus rhythm, PR 242 msec  Assessment and Plan:  1. Persistent atrial fibrillation, mitral annular flutter Doing well s/p ablation chads2vasc score is 3.  Continue xarelto  2. HTN Stable No change required today  3. Chest pain Both typical and atypical symptoms Cardiac CT shows moderate coronary calcification Last myoview 2016.  I did advise repeat stress testing today, which he declines.  I have advised that he follow-up with Stanley Waters.  I have provided script for sl NTG  and routine instructions.  Return to see me in 3 months Follow-up with Stanley Waters in the interim  Stanley Grayer MD, [facility] 12/21/2018 9:55 AM

## 2018-12-21 NOTE — Patient Instructions (Addendum)
Medication Instructions:  Your physician has recommended you make the following change in your medication: 1. START Nitroglycerin as needed.  Please review information below about this medication  * If you need a refill on your cardiac medications before your next appointment, please call your pharmacy.   Labwork: None ordered  Testing/Procedures: None ordered  Follow-Up: Your physician recommends that you schedule a follow-up appointment with Dr. [name].  Your physician recommends that you schedule a follow-up appointment in: 3 months with Dr. Rayann Heman.   Thank you for choosing [facility]!!    Any Other Special Instructions Will Be Listed Below (If Applicable).   Nitroglycerin sublingual tablets What is this medicine? NITROGLYCERIN (nye troe GLI ser in) is a type of vasodilator. It relaxes blood vessels, increasing the blood and oxygen supply to your heart. This medicine is used to relieve chest pain caused by angina. It is also used to prevent chest pain before activities like climbing stairs, going outdoors in cold weather, or sexual activity. This medicine may be used for other purposes; ask your health care provider or pharmacist if you have questions. COMMON BRAND NAME(S): Nitroquick, Nitrostat, Nitrotab What should I tell my health care provider before I take this medicine? They need to know if you have any of these conditions: -anemia -head injury, recent stroke, or bleeding in the brain -liver disease -previous heart attack -an unusual or allergic reaction to nitroglycerin, other medicines, foods, dyes, or preservatives -pregnant or trying to get pregnant -breast-feeding How should I use this medicine? Take this medicine by mouth as needed. At the first sign of an angina attack (chest pain or tightness) place one tablet under your tongue. You can also take this medicine 5 to 10 minutes before an event likely to produce chest pain. Follow the directions on the  prescription label. Let the tablet dissolve under the tongue. Do not swallow whole. Replace the dose if you accidentally swallow it. It will help if your mouth is not dry. Saliva around the tablet will help it to dissolve more quickly. Do not eat or drink, smoke or chew tobacco while a tablet is dissolving. If you are not better within 5 minutes after taking ONE dose of nitroglycerin, call 9-1-1 immediately to seek emergency medical care. Do not take more than 3 nitroglycerin tablets over 15 minutes. If you take this medicine often to relieve symptoms of angina, your doctor or health care professional may provide you with different instructions to manage your symptoms. If symptoms do not go away after following these instructions, it is important to call 9-1-1 immediately. Do not take more than 3 nitroglycerin tablets over 15 minutes. Talk to your pediatrician regarding the use of this medicine in children. Special care may be needed. Overdosage: If you think you have taken too much of this medicine contact a poison control center or emergency room at once. NOTE: This medicine is only for you. Do not share this medicine with others. What if I miss a dose? This does not apply. This medicine is only used as needed. What may interact with this medicine? Do not take this medicine with any of the following medications: -certain migraine medicines like ergotamine and dihydroergotamine (DHE) -medicines used to treat erectile dysfunction like sildenafil, tadalafil, and vardenafil -riociguat This medicine may also interact with the following medications: -alteplase -aspirin -heparin -medicines for high blood pressure -medicines for mental depression -other medicines used to treat angina -phenothiazines like chlorpromazine, mesoridazine, prochlorperazine, thioridazine This list may not describe all  possible interactions. Give your health care provider a list of all the medicines, herbs, non-prescription  drugs, or dietary supplements you use. Also tell them if you smoke, drink alcohol, or use illegal drugs. Some items may interact with your medicine. What should I watch for while using this medicine? Tell your doctor or health care professional if you feel your medicine is no longer working. Keep this medicine with you at all times. Sit or lie down when you take your medicine to prevent falling if you feel dizzy or faint after using it. Try to remain calm. This will help you to feel better faster. If you feel dizzy, take several deep breaths and lie down with your feet propped up, or bend forward with your head resting between your knees. You may get drowsy or dizzy. Do not drive, use machinery, or do anything that needs mental alertness until you know how this drug affects you. Do not stand or sit up quickly, especially if you are an older patient. This reduces the risk of dizzy or fainting spells. Alcohol can make you more drowsy and dizzy. Avoid alcoholic drinks. Do not treat yourself for coughs, colds, or pain while you are taking this medicine without asking your doctor or health care professional for advice. Some ingredients may increase your blood pressure. What side effects may I notice from receiving this medicine? Side effects that you should report to your doctor or health care professional as soon as possible: -blurred vision -dry mouth -skin rash -sweating -the feeling of extreme pressure in the head -unusually weak or tired Side effects that usually do not require medical attention (report to your doctor or health care professional if they continue or are bothersome): -flushing of the face or neck -headache -irregular heartbeat, palpitations -nausea, vomiting This list may not describe all possible side effects. Call your doctor for medical advice about side effects. You may report side effects to FDA at 1-800-FDA-1088. Where should I keep my medicine? Keep out of the reach of  children. Store at room temperature between 20 and 25 degrees C (68 and 77 degrees F). Store in [employer]. Protect from light and moisture. Keep tightly closed. Throw away any unused medicine after the expiration date. NOTE: This sheet is a summary. It may not cover all possible information. If you have questions about this medicine, talk to your doctor, pharmacist, or health care provider.  2019 Elsevier/Gold Standard (2013-08-17 17:57:36)

## 2018-12-23 ENCOUNTER — Encounter: Payer: Self-pay | Admitting: Cardiovascular Disease

## 2018-12-23 NOTE — Telephone Encounter (Signed)
error 

## 2019-01-04 ENCOUNTER — Encounter: Payer: Self-pay | Admitting: Cardiovascular Disease

## 2019-01-04 ENCOUNTER — Ambulatory Visit: Payer: PPO | Admitting: Cardiovascular Disease

## 2019-01-04 ENCOUNTER — Telehealth: Payer: Self-pay

## 2019-01-04 DIAGNOSIS — I10 Essential (primary) hypertension: Secondary | ICD-10-CM | POA: Diagnosis not present

## 2019-01-04 DIAGNOSIS — I4891 Unspecified atrial fibrillation: Secondary | ICD-10-CM

## 2019-01-04 NOTE — Patient Instructions (Signed)
Medication Instructions:  Your physician recommends that you continue on your current medications as directed. Please refer to the Current Medication list given to you today.  If you need a refill on your cardiac medications before your next appointment, please call your pharmacy.   Lab work: NONE If you have labs (blood work) drawn today and your tests are completely normal, you will receive your results only by: [name] MyChart Message (if you have MyChart) OR . A paper copy in the mail If you have any lab test that is abnormal or we need to change your treatment, we will call you to review the results.  Testing/Procedures: NONE  Follow-Up: At [facility], you and your health needs are our priority.  As part of our continuing mission to provide you with exceptional heart care, we have created designated Provider Care Teams.  These Care Teams include your primary Cardiologist (physician) and Advanced Practice Providers (APPs -  Physician Assistants and Nurse Practitioners) who all work together to provide you with the care you need, when you need it. . You will need a follow up appointment in 6 months WITH AN APP AND 12 MONTHS WITH DR. [name].  Please call our office 2 months in advance to schedule this appointment.  You may see Dr. [name] or one of the following Advanced Practice Providers on your designated Care Team:   . Kerin Ransom, [place] . Almyra Deforest, PA-C . Fabian Sharp, PA-C . Jory Sims, DNP . Rosaria Ferries, PA-C . Roby Lofts, PA-C . Sande Rives, PA-C

## 2019-01-04 NOTE — Telephone Encounter (Signed)
Patient provided with Xarelto 20 mg samples.  Medication Samples have been provided to the patient.  Drug name: Xarelto       Strength: 20 mg        Qty: 3 bottles  LOT: [id number]  Exp.Date: 09/2020

## 2019-01-04 NOTE — Assessment & Plan Note (Signed)
History of essential hypertension with blood pressure measured today at 158/70.  He is on lisinopril.  Continue current meds at current dosing.

## 2019-01-04 NOTE — Assessment & Plan Note (Signed)
History of atrial fibrillation status post multiple cardioversions.  He recently underwent A. fib ablation by Dr. Rayann Heman 09/20/2018.  Dr. Rayann Heman saw him back in follow-up 12/21/2018 and he was doing well without recurrence in sinus rhythm.  He remains on Xarelto oral anticoagulation.

## 2019-01-04 NOTE — Progress Notes (Signed)
01/04/2019 JUDDSON COBERN   10-27-1935  [id number]  Primary Physician Deland Pretty, MD Primary Cardiologist: Lorretta Harp MD FACP, [place], [place], [place]  HPI:  Stanley Waters is a 84 y.o.  mildly overweight widowed Caucasian male father of 22, grandfather of 2 grandchildren who I last saw  07/13/2018.[name] He has a history of normal coronary arteries by cath in 2002. He has PAF and has undergone a transesophageal echo, most recently by Dr. [name] July 07, 2012. His other problems include hypertension and hyperlipidemia. I adjusted his medications because he was bradycardic when I last saw him and changed his Eliquis to Xarelto at his request because of cost. He apparently has developed a rash since that time which he attributes to the Xarelto. He was admitted to the hospital on 06/29/13 with A. Fib with RVR. Ultimately underwent bedside DC cardioversion by Dr. [name] successfully to sinus rhythm after being switched from Rythmol to amiodarone. Since discharge he had multiple episodes of right true PAF responsive to when necessary supplemental metoprolol. He wore an event monitor for 2 weeks but did show episodes of bradycardia.since I saw him last in September he has had no episodes of atrial fibrillation. His metoprolol was discontinued as was his verapamil. He has had heart rates in the 40s and is symptomatic from this with dizziness. He had a stress test performed9/15/16 which is low risk. Because of labile hypertension and bradycardia he saw Almyra Deforest PACin the office 08/14/16. Event monitor showed sinus rhythm with several episodes of sinus bradycardia in the 30s and 40s although he is for the most part asymptomatic from these.  Since I saw him several months ago.  I did refer him to Dr. Rayann Heman for consideration of A. fib ablation which occurred on 09/20/2018 successfully.  Dr. Rayann Heman saw him back on 12/21/2018 at which time he was maintaining sinus rhythm and feeling clinically  improved on Xarelto.  He did have one episode of prolonged chest pain lasting 30 minutes which has not recurred.   Current Meds  Medication Sig  . acetaminophen (TYLENOL) 500 MG tablet Take 1,000 mg by mouth daily as needed for moderate pain or headache.  . Cholecalciferol (VITAMIN D3) 2000 units TABS Take 2,000 Units by mouth daily.  [name] doxazosin (CARDURA) 8 MG tablet Take 1 tablet (8 mg total) by mouth at bedtime.  . furosemide (LASIX) 20 MG tablet Take 20 mg by mouth daily as needed for fluid.   [name] lisinopril (PRINIVIL,ZESTRIL) 20 MG tablet Take 20 mg by mouth daily as needed (if systolic bp is 267).   . Multiple Vitamins-Minerals (ICAPS) CAPS Take 1 capsule by mouth daily.   . nitroGLYCERIN (NITROSTAT) 0.4 MG SL tablet Place 1 tablet (0.4 mg total) under the tongue every 5 (five) minutes as needed for chest pain.  . Omega-3 Fatty Acids (FISH OIL) 1000 MG CAPS Take 1,000 mg by mouth at bedtime.  [name] [name] (SYSTANE OP) Place 1 drop into both eyes daily as needed (dry eyes).  . polyethylene glycol (MIRALAX / GLYCOLAX) packet Take 17 g by mouth daily as needed for moderate constipation.  . rivaroxaban (XARELTO) 20 MG TABS tablet Take 1 tablet (20 mg total) by mouth daily with supper.  [name] rOPINIRole (REQUIP) 0.25 MG tablet Take 0.25 mg by mouth at bedtime.   . simvastatin (ZOCOR) 5 MG tablet Take 5 mg by mouth daily.   [name] triamcinolone cream (KENALOG) 0.1 % Apply 1 application topically 2 (  two) times daily as needed (SKIN RASHES).      Allergies  Allergen Reactions  . Prednisone Swelling, Other (See Comments) and Hypertension    Tachycardia  . Doxycycline Rash    Social History   Socioeconomic History  . Marital status: Widowed    Spouse name: Not on file  . Number of children: 2  . Years of education: Not on file  . Highest education level: Not on file  Occupational History    Employer: RETIRED    Comment: [employer]  . Financial  resource strain: Not on file  . Food insecurity:    Worry: Not on file    Inability: Not on file  . Transportation needs:    Medical: Not on file    Non-medical: Not on file  Tobacco Use  . Smoking status: Former Smoker    Types: Cigarettes    Last attempt to quit: 11/03/1963    Years since quitting: 55.2  . Smokeless tobacco: Never Used  Substance and Sexual Activity  . Alcohol use: No  . Drug use: No  . Sexual activity: Not on file  Lifestyle  . Physical activity:    Days per week: Not on file    Minutes per session: Not on file  . Stress: Not on file  Relationships  . Social connections:    Talks on phone: Not on file    Gets together: Not on file    Attends religious service: Not on file    Active member of club or organization: Not on file    Attends meetings of clubs or organizations: Not on file    Relationship status: Not on file  . Intimate partner violence:    Fear of current or ex partner: Not on file    Emotionally abused: Not on file    Physically abused: Not on file    Forced sexual activity: Not on file  Other Topics Concern  . Not on file  Social History Narrative   Lives in [place], alone   Retired from [employer]     Review of Systems: General: negative for chills, fever, night sweats or weight changes.  Cardiovascular: negative for chest pain, dyspnea on exertion, edema, orthopnea, palpitations, paroxysmal nocturnal dyspnea or shortness of breath Dermatological: negative for rash Respiratory: negative for cough or wheezing Urologic: negative for hematuria Abdominal: negative for nausea, vomiting, diarrhea, bright red blood per rectum, melena, or hematemesis Neurologic: negative for visual changes, syncope, or dizziness All other systems reviewed and are otherwise negative except as noted above.    Blood pressure (!) 158/70, pulse 68, height 6' (1.829 m), weight 202 lb 3.2 oz (91.7 kg), SpO2 97 %.  General appearance: alert and no  distress Neck: no adenopathy, no carotid bruit, no JVD, supple, symmetrical, trachea midline and thyroid not enlarged, symmetric, no tenderness/mass/nodules Lungs: clear to auscultation bilaterally Heart: regular rate and rhythm, S1, S2 normal, no murmur, click, rub or gallop Extremities: extremities normal, atraumatic, no cyanosis or edema Pulses: 2+ and symmetric Skin: Skin color, texture, turgor normal. No rashes or lesions Neurologic: Alert and oriented X 3, normal strength and tone. Normal symmetric reflexes. Normal coordination and gait  EKG not performed today  ASSESSMENT AND PLAN:   Atrial fibrillation with RVR (HCC) History of atrial fibrillation status post multiple cardioversions.  He recently underwent A. fib ablation by Dr. Rayann Heman 09/20/2018.  Dr. Rayann Heman saw him back in follow-up 12/21/2018 and he was doing well without recurrence  in sinus rhythm.  He remains on Xarelto oral anticoagulation.  Essential hypertension History of essential hypertension with blood pressure measured today at 158/70.  He is on lisinopril.  Continue current meds at current dosing.      Lorretta Harp MD FACP,FACC,FAHA, [facility] 01/04/2019 10:33 AM

## 2019-01-26 DIAGNOSIS — R972 Elevated prostate specific antigen [PSA]: Secondary | ICD-10-CM | POA: Diagnosis not present

## 2019-02-02 DIAGNOSIS — N401 Enlarged prostate with lower urinary tract symptoms: Secondary | ICD-10-CM | POA: Diagnosis not present

## 2019-02-02 DIAGNOSIS — R351 Nocturia: Secondary | ICD-10-CM | POA: Diagnosis not present

## 2019-02-02 DIAGNOSIS — I2510 Atherosclerotic heart disease of native coronary artery without angina pectoris: Secondary | ICD-10-CM | POA: Diagnosis not present

## 2019-02-02 DIAGNOSIS — R972 Elevated prostate specific antigen [PSA]: Secondary | ICD-10-CM | POA: Diagnosis not present

## 2019-02-20 DIAGNOSIS — I10 Essential (primary) hypertension: Secondary | ICD-10-CM | POA: Diagnosis not present

## 2019-02-20 DIAGNOSIS — D696 Thrombocytopenia, unspecified: Secondary | ICD-10-CM | POA: Diagnosis not present

## 2019-02-23 DIAGNOSIS — D709 Neutropenia, unspecified: Secondary | ICD-10-CM | POA: Diagnosis not present

## 2019-02-23 DIAGNOSIS — M8589 Other specified disorders of bone density and structure, multiple sites: Secondary | ICD-10-CM | POA: Diagnosis not present

## 2019-02-23 DIAGNOSIS — I10 Essential (primary) hypertension: Secondary | ICD-10-CM | POA: Diagnosis not present

## 2019-02-23 DIAGNOSIS — I2510 Atherosclerotic heart disease of native coronary artery without angina pectoris: Secondary | ICD-10-CM | POA: Diagnosis not present

## 2019-02-23 DIAGNOSIS — D696 Thrombocytopenia, unspecified: Secondary | ICD-10-CM | POA: Diagnosis not present

## 2019-02-23 DIAGNOSIS — N182 Chronic kidney disease, stage 2 (mild): Secondary | ICD-10-CM | POA: Diagnosis not present

## 2019-02-23 DIAGNOSIS — Z0000 Encounter for general adult medical examination without abnormal findings: Secondary | ICD-10-CM | POA: Diagnosis not present

## 2019-02-23 DIAGNOSIS — J309 Allergic rhinitis, unspecified: Secondary | ICD-10-CM | POA: Diagnosis not present

## 2019-02-23 DIAGNOSIS — M109 Gout, unspecified: Secondary | ICD-10-CM | POA: Diagnosis not present

## 2019-02-23 DIAGNOSIS — F17211 Nicotine dependence, cigarettes, in remission: Secondary | ICD-10-CM | POA: Diagnosis not present

## 2019-02-23 DIAGNOSIS — E785 Hyperlipidemia, unspecified: Secondary | ICD-10-CM | POA: Diagnosis not present

## 2019-02-23 DIAGNOSIS — M8580 Other specified disorders of bone density and structure, unspecified site: Secondary | ICD-10-CM | POA: Diagnosis not present

## 2019-02-23 DIAGNOSIS — I4891 Unspecified atrial fibrillation: Secondary | ICD-10-CM | POA: Diagnosis not present

## 2019-03-02 DIAGNOSIS — M8580 Other specified disorders of bone density and structure, unspecified site: Secondary | ICD-10-CM | POA: Diagnosis not present

## 2019-03-30 ENCOUNTER — Telehealth: Payer: Self-pay

## 2019-03-30 NOTE — Telephone Encounter (Signed)
Spoke with pt about appt on 03/31/19. Pt sated he does not have acces to a smart device. Pt was advise to keep phonevisit and check vitals day of appt. Pt concerns were address.

## 2019-03-31 ENCOUNTER — Encounter: Payer: Self-pay | Admitting: Internal Medicine

## 2019-03-31 ENCOUNTER — Telehealth (INDEPENDENT_AMBULATORY_CARE_PROVIDER_SITE_OTHER): Payer: PPO | Admitting: Internal Medicine

## 2019-03-31 ENCOUNTER — Other Ambulatory Visit: Payer: Self-pay

## 2019-03-31 DIAGNOSIS — I10 Essential (primary) hypertension: Secondary | ICD-10-CM | POA: Diagnosis not present

## 2019-03-31 DIAGNOSIS — I4819 Other persistent atrial fibrillation: Secondary | ICD-10-CM

## 2019-03-31 NOTE — Progress Notes (Signed)
Electrophysiology TeleHealth Note   Due to national recommendations of social distancing due to [place] 19, an audio telehealth visit is felt to be most appropriate for this patient at this time.  Verbal consent obtained from the patient today.  He states that he is unable to do a virtual visit and requests phone call instead.   Date:  03/31/2019   ID:  Stanley Waters, DOB [birth year], MRN [id number]  Location: patient's home  Provider location: [address], [place]  Evaluation Performed: Follow-up visit  PCP:  Deland Pretty, MD  Cardiologist:  Quay Burow, MD  Electrophysiologist:  Dr Rayann Heman  Chief Complaint:  afib  History of Present Illness:    Stanley Waters is a 84 y.o. male who presents via audio conferencing for a telehealth visit today.  Since last being seen in our clinic, the patient reports doing very well.  Today, he denies symptoms of palpitations, chest pain, shortness of breath,  lower extremity edema, dizziness, presyncope, or syncope.  The patient is otherwise without complaint today.  The patient denies symptoms of fevers, chills, cough, or new SOB worrisome for COVID 19.  Past Medical History:  Diagnosis Date  . Atrial flutter ([place])   . Atypical chest pain    Myoview performed 07/23/11 was completely normal. Post stress EF 61%.  . Bruit    Left asymptomatic bruit. Carotid Duplex 12/13/12 =mildly abnormal. *BILATERAL BULB/PROXIMAL ICAs: Demonstrated a mild amount of fibrous plaque w/no evidence od significant diameter reduction, tortuosity or other vascular abnormality.  . Enlarged prostate    BPH - PSA of 6.7 this is consistant with his last PSA of 6.3  . Hyperlipemia   . Hypertension   . Inguinal hernia    Inguinal hernia repair (x) 2 1991, 2011  . Intestinal polyps 09/22/11   Colonoscopy removed a 2 mm sessile cecal polyp with a cold snare.  . Measles   . Mumps   . Normal coronary arteries 2002  . Peripheral neuropathy   . Persistent  atrial fibrillation   . Seasonal allergies   . Skin cancer   . Thrombocytopenia ([place]) 2011   Very mild with platelets of 135,000.  [name] Tinnitus   . Umbilical hernia   . Vitamin D deficiency     Past Surgical History:  Procedure Laterality Date  . ATRIAL FIBRILLATION ABLATION  09/20/2018  . ATRIAL FIBRILLATION ABLATION N/A 09/20/2018   Procedure: ATRIAL FIBRILLATION ABLATION;  Surgeon: Thompson Grayer, MD;  Location: [facility];  Service: Cardiovascular;  Laterality: N/A;  . CARDIAC CATHETERIZATION  2002   "Normal cardiac catheterization"  . CARDIOVERSION  07/07/2012   A fib - Cardioversion successful.  [name] CARDIOVERSION N/A 06/30/2013   Procedure: CARDIOVERSION;  Surgeon: Sanda Klein, MD;  Location: [facility];  Service: Cardiovascular;  Laterality: N/A;  . CARDIOVERSION N/A 08/02/2018   Procedure: CARDIOVERSION;  Surgeon: Lelon Perla, MD;  Location: [facility];  Service: Cardiovascular;  Laterality: N/A;  . Carotid Duplex  12/13/12   For asymptomatic bruit. Mildly abnormal.  *BILATERAL BULB/PROXIMAL ICAs: Demonstrated a mild amount of fibrous plaque w/no evidence of significant diameter reduction, tortuosity or other vascular abnormality.   . COLONOSCOPY W/ POLYPECTOMY  09/22/11   2 mm sessile cecal polyp was removed with a cold snare.  [name] HAND SURGERY    . [place] & 2011  . TEE WITHOUT CARDIOVERSION  07/07/2012   Procedure: TRANSESOPHAGEAL ECHOCARDIOGRAM (TEE);  Surgeon: Pixie Casino,  MD;  Location: [facility];  Service: Cardiovascular;  Laterality: N/A;  . TEE WITHOUT CARDIOVERSION N/A 08/02/2018   Procedure: TRANSESOPHAGEAL ECHOCARDIOGRAM (TEE);  Surgeon: Lelon Perla, MD;  Location: [facility];  Service: Cardiovascular;  Laterality: N/A;    Current Outpatient Medications  Medication Sig Dispense Refill  . acetaminophen (TYLENOL) 500 MG tablet Take 1,000 mg by mouth daily as needed for moderate pain or headache.    . Cholecalciferol (VITAMIN  D3) 2000 units TABS Take 2,000 Units by mouth daily.    . Cyanocobalamin (B-12) 2500 MCG SUBL Place 1 tablet under the tongue daily.    [name] doxazosin (CARDURA) 8 MG tablet Take 1 tablet (8 mg total) by mouth at bedtime. 90 tablet 3  . furosemide (LASIX) 20 MG tablet Take 20 mg by mouth daily as needed for fluid.     [name] lisinopril (PRINIVIL,ZESTRIL) 20 MG tablet Take 20 mg by mouth daily as needed (if systolic bp is 188).     . Multiple Vitamins-Minerals (ICAPS) CAPS Take 1 capsule by mouth daily.     [name] MYRBETRIQ 25 MG TB24 tablet Take 1 tablet by mouth daily.    . Omega-3 Fatty Acids (FISH OIL) 1000 MG CAPS Take 1,000 mg by mouth at bedtime.    Stanley Waters (SYSTANE OP) Place 1 drop into both eyes daily as needed (dry eyes).    . polyethylene Waters (MIRALAX / GLYCOLAX) packet Take 17 g by mouth daily as needed for moderate constipation.    . rivaroxaban (XARELTO) 20 MG TABS tablet Take 1 tablet (20 mg total) by mouth daily with supper. 90 tablet 2  . rOPINIRole (REQUIP) 0.25 MG tablet Take 0.25 mg by mouth at bedtime.     . simvastatin (ZOCOR) 5 MG tablet Take 5 mg by mouth daily.     [name] triamcinolone cream (KENALOG) 0.1 % Apply 1 application topically 2 (two) times daily as needed (SKIN RASHES).     . nitroGLYCERIN (NITROSTAT) 0.4 MG SL tablet Place 1 tablet (0.4 mg total) under the tongue every 5 (five) minutes as needed for chest pain. 25 tablet 1   No current facility-administered medications for this visit.     Allergies:   Prednisone and Doxycycline   Social History:  The patient  reports that he quit smoking about 55 years ago. His smoking use included cigarettes. He has never used smokeless tobacco. He reports that he does not drink alcohol or use drugs.   Family History:  The patient's  family history includes Heart failure in his brother.   ROS:  Please see the history of present illness.   All other systems are personally reviewed and negative.    Exam:     Vital Signs:  BP 127/64   Pulse 68   Ht 6' (1.829 m)   Wt 196 lb (88.9 kg)   BMI 26.58 kg/m   Well sounding   Labs/Other Tests and Data Reviewed:    Recent Labs: 07/13/2018: ALT 10; TSH 3.190 07/19/2018: Magnesium 2.0 08/24/2018: BUN 17; Creatinine, Ser 1.14; Hemoglobin 15.1; Platelets 161; Potassium 3.6; Sodium 143   Wt Readings from Last 3 Encounters:  03/31/19 196 lb (88.9 kg)  01/04/19 202 lb 3.2 oz (91.7 kg)  12/21/18 198 lb 3.2 oz (89.9 kg)     Other studies personally reviewed: Additional studies/ records that were reviewed today include: my prior notes,  Dr Rachel Bo notes, my prior notes    ASSESSMENT & PLAN:    1.  Persistent atrial fibrillation/ mitral annular flutter Doing great post ablation off AAD therapy chads2vasc score is 3.  Continue xarelto  2. HTN Stable No change required today  3. COVID 19 screen The patient denies symptoms of COVID 19 at this time.  The importance of social distancing was discussed today.  Follow-up:  AF clinic in 6 months  Current medicines are reviewed at length with the patient today.   The patient does not have concerns regarding his medicines.  The following changes were made today:  none  Labs/ tests ordered today include:  No orders of the defined types were placed in this encounter.   Patient Risk:  after full review of this patients clinical status, I feel that they are at moderate risk at this time.  Today, I have spent 22 minutes with the patient with telehealth technology discussing afib .    Army Fossa, MD  03/31/2019 10:32 AM     [facility] [address] [place] [address] [phone] (office) [phone] (fax)

## 2019-04-10 DIAGNOSIS — L299 Pruritus, unspecified: Secondary | ICD-10-CM | POA: Diagnosis not present

## 2019-04-10 DIAGNOSIS — S1096XA Insect bite of unspecified part of neck, initial encounter: Secondary | ICD-10-CM | POA: Diagnosis not present

## 2019-05-16 ENCOUNTER — Telehealth: Payer: Self-pay | Admitting: Cardiovascular Disease

## 2019-05-16 NOTE — Telephone Encounter (Signed)
New message   Patient c/o Palpitations:  High priority if patient c/o lightheadedness, shortness of breath, or chest pain  1) How long have you had palpitations/irregular HR/ Afib? Are you having the symptoms now? Since yesterday per patient, no   2) Are you currently experiencing lightheadedness, SOB or CP? No   3) Do you have a history of afib (atrial fibrillation) or irregular heart rhythm?yes   4) Have you checked your BP or HR? (document readings if available): 132/68 b/p 68 hr  5) Are you experiencing any other symptoms? No

## 2019-05-16 NOTE — Telephone Encounter (Signed)
    COVID-19 Pre-Screening Questions:  . In the past 7 to 10 days have you had a cough, shortness of breath, headache, congestion, fever (100 or greater) body aches, chills, sore throat, or sudden loss of taste or sense of smell? NO . Have you been around anyone with known Covid 19? NO . Have you been around anyone who is awaiting Covid 19 test results in the past 7 to 10 days? NO . Have you been around anyone who has been exposed to Covid 19, or has mentioned symptoms of Covid 19 within the past 7 to 10 days? NO  If you have any concerns/questions about symptoms patients report during screening (either on the phone or at threshold). Contact the provider seeing the patient or DOD for further guidance.  If neither are available contact a member of the leadership team.   Scheduled patient for MD OV on Friday 7/17 @ 415. He is aware of visitor policy and to wear mask, if he has one.

## 2019-05-16 NOTE — Telephone Encounter (Signed)
Called patient back- he states for the past few days he has felt his irregular heart beat- he states he will feel it every once in a while. He denies any symptoms, denies chest pain, SOB, swelling, lightheadedness. He has not missed any doses of medication. He would like to know if he should be seen sooner? His BP has been 132/68 HR 68 and just now was 128/68 HR 64.  Advised would route to MD for recommendations.

## 2019-05-16 NOTE — Telephone Encounter (Signed)
I would be happy to see him back sooner for palpitations.

## 2019-05-19 ENCOUNTER — Encounter: Payer: Self-pay | Admitting: Cardiovascular Disease

## 2019-05-19 ENCOUNTER — Other Ambulatory Visit: Payer: Self-pay

## 2019-05-19 ENCOUNTER — Ambulatory Visit: Payer: PPO | Admitting: Cardiovascular Disease

## 2019-05-19 DIAGNOSIS — E782 Mixed hyperlipidemia: Secondary | ICD-10-CM | POA: Diagnosis not present

## 2019-05-19 DIAGNOSIS — I480 Paroxysmal atrial fibrillation: Secondary | ICD-10-CM

## 2019-05-19 DIAGNOSIS — I10 Essential (primary) hypertension: Secondary | ICD-10-CM | POA: Diagnosis not present

## 2019-05-19 NOTE — Assessment & Plan Note (Signed)
History of hyperlipidemia on simvastatin 02/20/2019 revealing total cholesterol 138, LDL of 72 and HDL of 52.

## 2019-05-19 NOTE — Assessment & Plan Note (Signed)
History of paroxysmal atrial fibrillation status post A. fib ablation by Dr. Rayann Heman 09/20/2018.  When he saw him virtually 03/31/2019 he was doing well and appeared to be on sinus rhythm on Xarelto.  He is felt somewhat weak since Monday and noted that his heart was irregular.

## 2019-05-19 NOTE — Assessment & Plan Note (Signed)
History of essential hypertension blood pressure measured today 149/69.  He is on lisinopril.  Continue current meds at current dosing.

## 2019-05-19 NOTE — Progress Notes (Signed)
05/19/2019 [name]   November 02, 1935  [id number]  Primary Physician Deland Pretty, MD Primary Cardiologist: Lorretta Harp MD FACP, [place], [place], [place]  HPI:  CARLOUS Waters is a 84 y.o.  mildly overweight widowed Caucasian male father of 63, grandfather of 2 grandchildren who I last saw  01/04/2019... He has a history of normal coronary arteries by cath in 2002. He has PAF and has undergone a transesophageal echo, most recently by Dr. [name] July 07, 2012. His other problems include hypertension and hyperlipidemia. I adjusted his medications because he was bradycardic when I last saw him and changed his Eliquis to Xarelto at his request because of cost. He apparently has developed a rash since that time which he attributes to the Xarelto. He was admitted to the hospital on 06/29/13 with A. Fib with RVR. Ultimately underwent bedside DC cardioversion by Dr. [name] successfully to sinus rhythm after being switched from Rythmol to amiodarone. Since discharge he had multiple episodes of right true PAF responsive to when necessary supplemental metoprolol. He wore an event monitor for 2 weeks but did show episodes of bradycardia.since I saw him last in September he has had no episodes of atrial fibrillation. His metoprolol was discontinued as was his verapamil. He has had heart rates in the 40s and is symptomatic from this with dizziness. He had a stress test performed9/15/16 which is low risk. Because of labile hypertension and bradycardia he saw Almyra Deforest PACin the office 08/14/16. Event monitor showed sinus rhythm with several episodes of sinus bradycardia in the 30s and 40s although he is for the most part asymptomatic from these.  Since I saw him several months ago.  I did refer him to Dr. Rayann Heman for consideration of A. fib ablation which occurred on 09/20/2018 successfully.  Dr. Rayann Heman saw him back on 03/31/2019 at which time he was maintaining sinus rhythm and feeling clinically  improved on Xarelto.  Since I saw him 4 months ago he is done well since this past Monday when he developed some palpitations.  He denies chest pain or shortness of breath.   Current Meds  Medication Sig  . acetaminophen (TYLENOL) 500 MG tablet Take 1,000 mg by mouth daily as needed for moderate pain or headache.  . Cholecalciferol (VITAMIN D3) 2000 units TABS Take 2,000 Units by mouth daily.  . Cyanocobalamin (B-12) 2500 MCG SUBL Place 1 tablet under the tongue daily.  [name] doxazosin (CARDURA) 8 MG tablet Take 1 tablet (8 mg total) by mouth at bedtime.  . furosemide (LASIX) 20 MG tablet Take 20 mg by mouth daily as needed for fluid.   [name] lisinopril (PRINIVIL,ZESTRIL) 20 MG tablet Take 20 mg by mouth daily as needed (if systolic bp is 563).   . Multiple Vitamins-Minerals (ICAPS) CAPS Take 1 capsule by mouth daily.   [name] MYRBETRIQ 25 MG TB24 tablet Take 1 tablet by mouth daily.  . Omega-3 Fatty Acids (FISH OIL) 1000 MG CAPS Take 1,000 mg by mouth at bedtime.  [name] [name] (SYSTANE OP) Place 1 drop into both eyes daily as needed (dry eyes).  . polyethylene glycol (MIRALAX / GLYCOLAX) packet Take 17 g by mouth daily as needed for moderate constipation.  . rivaroxaban (XARELTO) 20 MG TABS tablet Take 1 tablet (20 mg total) by mouth daily with supper.  [name] rOPINIRole (REQUIP) 0.25 MG tablet Take 0.25 mg by mouth at bedtime.   . simvastatin (ZOCOR) 5 MG tablet Take 5 mg by mouth  daily.   . triamcinolone cream (KENALOG) 0.1 % Apply 1 application topically 2 (two) times daily as needed (SKIN RASHES).      Allergies  Allergen Reactions  . Prednisone Swelling, Other (See Comments) and Hypertension    Tachycardia  . Doxycycline Rash    Social History   Socioeconomic History  . Marital status: Widowed    Spouse name: Not on file  . Number of children: 2  . Years of education: Not on file  . Highest education level: Not on file  Occupational History    Employer: RETIRED     Comment: [employer]  . Financial resource strain: Not on file  . Food insecurity    Worry: Not on file    Inability: Not on file  . Transportation needs    Medical: Not on file    Non-medical: Not on file  Tobacco Use  . Smoking status: Former Smoker    Types: Cigarettes    Quit date: 11/03/1963    Years since quitting: 55.5  . Smokeless tobacco: Never Used  Substance and Sexual Activity  . Alcohol use: No  . Drug use: No  . Sexual activity: Not on file  Lifestyle  . Physical activity    Days per week: Not on file    Minutes per session: Not on file  . Stress: Not on file  Relationships  . Social Herbalist on phone: Not on file    Gets together: Not on file    Attends religious service: Not on file    Active member of club or organization: Not on file    Attends meetings of clubs or organizations: Not on file    Relationship status: Not on file  . Intimate partner violence    Fear of current or ex partner: Not on file    Emotionally abused: Not on file    Physically abused: Not on file    Forced sexual activity: Not on file  Other Topics Concern  . Not on file  Social History Narrative   Lives in [place], alone   Retired from Landscape [employer]     Review of Systems: General: negative for chills, fever, night sweats or weight changes.  Cardiovascular: negative for chest pain, dyspnea on exertion, edema, orthopnea, palpitations, paroxysmal nocturnal dyspnea or shortness of breath Dermatological: negative for rash Respiratory: negative for cough or wheezing Urologic: negative for hematuria Abdominal: negative for nausea, vomiting, diarrhea, bright red blood per rectum, melena, or hematemesis Neurologic: negative for visual changes, syncope, or dizziness All other systems reviewed and are otherwise negative except as noted above.    Blood pressure (!) 149/69, pulse (!) 53, temperature (!) 97.5 F (36.4 C), height 6' (1.829 m),  weight 201 lb 12.8 oz (91.5 kg), SpO2 97 %.  General appearance: alert and no distress Neck: no adenopathy, no carotid bruit, no JVD, supple, symmetrical, trachea midline and thyroid not enlarged, symmetric, no tenderness/mass/nodules Lungs: clear to auscultation bilaterally Heart: Slightly irregular with multiple extra beats. Extremities: extremities normal, atraumatic, no cyanosis or edema Pulses: 2+ and symmetric Skin: Skin color, texture, turgor normal. No rashes or lesions Neurologic: Alert and oriented X 3, normal strength and tone. Normal symmetric reflexes. Normal coordination and gait  EKG sinus rhythm with multiple PACs.  I personally reviewed this EKG.  ASSESSMENT AND PLAN:   Paroxysmal atrial fibrillation History of paroxysmal atrial fibrillation status post A. fib ablation by Dr. Rayann Heman 09/20/2018.  When he  saw him virtually 03/31/2019 he was doing well and appeared to be on sinus rhythm on Xarelto.  He is felt somewhat weak since Monday and noted that his heart was irregular.  Hyperlipidemia History of hyperlipidemia on simvastatin 02/20/2019 revealing total cholesterol 138, LDL of 72 and HDL of 52.  Essential hypertension History of essential hypertension blood pressure measured today 149/69.  He is on lisinopril.  Continue current meds at current dosing.      Lorretta Harp MD FACP,FACC,FAHA, [facility] 05/19/2019 4:31 PM

## 2019-05-19 NOTE — Patient Instructions (Addendum)

## 2019-05-23 NOTE — Addendum Note (Signed)
Addended by: Diana Eves on: 05/23/2019 02:16 PM   Modules accepted: Orders

## 2019-06-02 ENCOUNTER — Other Ambulatory Visit: Payer: Self-pay

## 2019-07-17 DIAGNOSIS — Z23 Encounter for immunization: Secondary | ICD-10-CM | POA: Diagnosis not present

## 2019-09-05 DIAGNOSIS — H02132 Senile ectropion of right lower eyelid: Secondary | ICD-10-CM | POA: Diagnosis not present

## 2019-09-05 DIAGNOSIS — H353131 Nonexudative age-related macular degeneration, bilateral, early dry stage: Secondary | ICD-10-CM | POA: Diagnosis not present

## 2019-09-05 DIAGNOSIS — Z961 Presence of intraocular lens: Secondary | ICD-10-CM | POA: Diagnosis not present

## 2019-09-05 DIAGNOSIS — H02135 Senile ectropion of left lower eyelid: Secondary | ICD-10-CM | POA: Diagnosis not present

## 2019-10-26 DIAGNOSIS — R05 Cough: Secondary | ICD-10-CM | POA: Diagnosis not present

## 2019-10-26 DIAGNOSIS — Z1159 Encounter for screening for other viral diseases: Secondary | ICD-10-CM | POA: Diagnosis not present

## 2019-10-26 DIAGNOSIS — U071 COVID-19: Secondary | ICD-10-CM | POA: Diagnosis not present

## 2019-10-31 ENCOUNTER — Ambulatory Visit: Payer: PPO | Attending: Internal Medicine

## 2019-10-31 DIAGNOSIS — Z20822 Contact with and (suspected) exposure to covid-19: Secondary | ICD-10-CM

## 2019-10-31 DIAGNOSIS — Z20828 Contact with and (suspected) exposure to other viral communicable diseases: Secondary | ICD-10-CM | POA: Diagnosis not present

## 2019-11-01 ENCOUNTER — Telehealth: Payer: Self-pay | Admitting: Infectious Diseases

## 2019-11-01 LAB — NOVEL CORONAVIRUS, NAA: SARS-CoV-2, NAA: DETECTED — AB

## 2019-11-01 NOTE — Telephone Encounter (Signed)
Called to discuss with patient about Covid symptoms and the use of bamlanivimab, a monoclonal antibody infusion for those with mild to moderate Covid symptoms and at a high risk of hospitalization.  Pt is qualified for this infusion at the [facility] infusion center due to Age > 75.   He was tested at outside location on 10/26/19  Started last Wednesday 12/22 with malaise and fatigue. He had "one test that was positive and one that was negative." He has also been running a high fever since 10/26/19 with last fever 101 F this morning.  His daughter in law Ella Easterly called also and I spoke with her as well.   Will try to get him on cancellation list for Thursday

## 2019-11-02 ENCOUNTER — Telehealth: Payer: Self-pay | Admitting: Infectious Diseases

## 2019-11-02 ENCOUNTER — Ambulatory Visit (HOSPITAL_COMMUNITY)
Admission: RE | Admit: 2019-11-02 | Discharge: 2019-11-02 | Disposition: A | Discharge status: Home / Self Care | Payer: Medicare Other | Source: Ambulatory Visit | Attending: Pulmonary Disease | Admitting: Pulmonary Disease

## 2019-11-02 ENCOUNTER — Other Ambulatory Visit: Payer: Self-pay | Admitting: Infectious Diseases

## 2019-11-02 DIAGNOSIS — U071 COVID-19: Secondary | ICD-10-CM | POA: Diagnosis not present

## 2019-11-02 DIAGNOSIS — Z23 Encounter for immunization: Secondary | ICD-10-CM | POA: Insufficient documentation

## 2019-11-02 MED ORDER — METHYLPREDNISOLONE SODIUM SUCC 125 MG IJ SOLR: 125.0000 mg | Freq: Once | INTRAMUSCULAR | Status: DC | PRN

## 2019-11-02 MED ORDER — ALBUTEROL SULFATE HFA 108 (90 BASE) MCG/ACT IN AERS: 2.0000 | INHALATION_SPRAY | Freq: Once | RESPIRATORY_TRACT | Status: DC | PRN

## 2019-11-02 MED ORDER — EPINEPHRINE 0.3 MG/0.3ML IJ SOAJ: 0.3000 mg | Freq: Once | INTRAMUSCULAR | Status: DC | PRN

## 2019-11-02 MED ORDER — SODIUM CHLORIDE 0.9 % IV SOLN
INTRAVENOUS | Status: DC | PRN
  Administered 2019-11-02: 250 mL via INTRAVENOUS

## 2019-11-02 MED ORDER — SODIUM CHLORIDE 0.9 % IV SOLN
700.0000 mg | Freq: Once | INTRAVENOUS | Status: AC
  Administered 2019-11-02: 11:00:00 700 mg via INTRAVENOUS
  Filled 2019-11-02: qty 20

## 2019-11-02 MED ORDER — DIPHENHYDRAMINE HCL 50 MG/ML IJ SOLN: 50.0000 mg | Freq: Once | INTRAMUSCULAR | Status: DC | PRN

## 2019-11-02 MED ORDER — FAMOTIDINE IN NACL 20-0.9 MG/50ML-% IV SOLN: 20.0000 mg | Freq: Once | INTRAVENOUS | Status: DC | PRN

## 2019-11-02 NOTE — Discharge Instructions (Signed)
What types of side effects do monoclonal antibody drugs cause?  Common side effects In general, the more common side effects caused by monoclonal antibody drugs include: . Allergic reactions, such as hives or itching . Flu-like signs and symptoms, including chills, fatigue, fever, and muscle aches and pains . Nausea, vomiting . Diarrhea . Skin rashes . Low blood pressure   The CDC is recommending patients who receive monoclonal antibody treatments wait at least 90 days before being vaccinated:  Currently, there are no data on the safety and efficacy of mRNA COVID-19 vaccines in persons who received monoclonal antibodies or convalescent plasma as part of COVID-19 treatment. Based on the estimated half-life of such therapies as well as evidence suggesting that reinfection is uncommon in the 90 days after initial infection, vaccination should be deferred for at least 90 days, as a precautionary measure until additional information becomes available, to avoid interference of the antibody treatment with vaccine-induced immune responses    The CDC is recommending patients who receive monoclonal antibody treatments wait at least 90 days before being vaccinated:  Currently, there are no data on the safety and efficacy of mRNA COVID-19 vaccines in persons who received monoclonal antibodies or convalescent plasma as part of COVID-19 treatment. Based on the estimated half-life of such therapies as well as evidence suggesting that reinfection is uncommon in the 90 days after initial infection, vaccination should be deferred for at least 90 days, as a precautionary measure until additional information becomes available, to avoid interference of the antibody treatment with vaccine-induced immune responses  10 Things You Can Do to Manage Your COVID-19 Symptoms at Home If you have possible or confirmed COVID-19: 1. Stay home from work and school. And stay away from other public places. If you must go out,  avoid using any kind of public transportation, ridesharing, or taxis. 2. Monitor your symptoms carefully. If your symptoms get worse, call your healthcare provider immediately. 3. Get rest and stay hydrated. 4. If you have a medical appointment, call the healthcare provider ahead of time and tell them that you have or may have COVID-19. 5. For medical emergencies, call 911 and notify the dispatch personnel that you have or may have COVID-19. 6. Cover your cough and sneezes with a tissue or use the inside of your elbow. 7. Wash your hands often with soap and water for at least 20 seconds or clean your hands with an alcohol-based hand sanitizer that contains at least 60% alcohol. 8. As much as possible, stay in a specific room and away from other people in your home. Also, you should use a separate bathroom, if available. If you need to be around other people in or outside of the home, wear a mask. 9. Avoid sharing personal items with other people in your household, like dishes, towels, and bedding. 10. Clean all surfaces that are touched often, like counters, tabletops, and doorknobs. Use household cleaning sprays or wipes according to the label instructions. [email or web address] 05/03/2019 This information is not intended to replace advice given to you by your health care provider. Make sure you discuss any questions you have with your health care provider. Document Revised: 10/05/2019 Document Reviewed: 10/05/2019 Elsevier Patient Education  [place].

## 2019-11-02 NOTE — Telephone Encounter (Signed)
error 

## 2019-11-02 NOTE — Progress Notes (Signed)
  I connected by phone with [name] on 11/02/2019 at 10:16 AM to discuss the potential use of an new treatment for mild to moderate COVID-19 viral infection in non-hospitalized patients.  This patient is a 84 y.o. male that meets the FDA criteria for Emergency Use Authorization of bamlanivimab or casirivimab\imdevimab.  Has a (+) direct SARS-CoV-2 viral test result  Has mild or moderate COVID-19   Is ? 84 years of age and weighs ? 40 kg  Is NOT hospitalized due to COVID-19  Is NOT requiring oxygen therapy or requiring an increase in baseline oxygen flow rate due to COVID-19  Is within 10 days of symptom onset  Has at least one of the high risk factor(s) for progression to severe COVID-19 and/or hospitalization as defined in EUA.  Specific high risk criteria : >/= 82 yo   I have spoken and communicated the following to the patient or parent/caregiver:  1. FDA has authorized the emergency use of bamlanivimab and casirivimab\imdevimab for the treatment of mild to moderate COVID-19 in adults and pediatric patients with positive results of direct SARS-CoV-2 viral testing who are 17 years of age and older weighing at least 40 kg, and who are at high risk for progressing to severe COVID-19 and/or hospitalization.  2. The significant known and potential risks and benefits of bamlanivimab and casirivimab\imdevimab, and the extent to which such potential risks and benefits are unknown.  3. Information on available alternative treatments and the risks and benefits of those alternatives, including clinical trials.  4. Patients treated with bamlanivimab and casirivimab\imdevimab should continue to self-isolate and use infection control measures (e.g., wear mask, isolate, social distance, avoid sharing personal items, clean and disinfect "high touch" surfaces, and frequent handwashing) according to CDC guidelines.   5. The patient or parent/caregiver has the option to accept or refuse  bamlanivimab or casirivimab\imdevimab .  After reviewing this information with the patient, The patient agreed to proceed with receiving the bamlanimivab infusion and will be provided a copy of the Fact sheet prior to receiving the infusion.Janene Madeira 11/02/2019 10:16 AM

## 2019-11-02 NOTE — Progress Notes (Signed)
  Diagnosis: COVID-19  Physician: Dr. Deland Pretty  Procedure: [facility] Infusion [facility] Med: bamlanivimab infusion - Provided patient with bamlanimivab fact sheet for patients, parents and caregivers prior to infusion.  Complications: No immediate complications noted.  Discharge: Discharged home   Adeola Dennen L 11/02/2019

## 2019-11-15 DIAGNOSIS — L821 Other seborrheic keratosis: Secondary | ICD-10-CM | POA: Diagnosis not present

## 2019-11-15 DIAGNOSIS — L814 Other melanin hyperpigmentation: Secondary | ICD-10-CM | POA: Diagnosis not present

## 2019-11-15 DIAGNOSIS — L570 Actinic keratosis: Secondary | ICD-10-CM | POA: Diagnosis not present

## 2019-11-15 DIAGNOSIS — L820 Inflamed seborrheic keratosis: Secondary | ICD-10-CM | POA: Diagnosis not present

## 2019-11-15 DIAGNOSIS — Z85828 Personal history of other malignant neoplasm of skin: Secondary | ICD-10-CM | POA: Diagnosis not present

## 2019-12-13 DIAGNOSIS — H52209 Unspecified astigmatism, unspecified eye: Secondary | ICD-10-CM | POA: Diagnosis not present

## 2019-12-13 DIAGNOSIS — H5203 Hypermetropia, bilateral: Secondary | ICD-10-CM | POA: Diagnosis not present

## 2019-12-13 DIAGNOSIS — H524 Presbyopia: Secondary | ICD-10-CM | POA: Diagnosis not present

## 2020-01-03 ENCOUNTER — Ambulatory Visit: Payer: Medicare HMO | Admitting: Cardiovascular Disease

## 2020-01-03 ENCOUNTER — Encounter: Payer: Self-pay | Admitting: Cardiovascular Disease

## 2020-01-03 ENCOUNTER — Other Ambulatory Visit: Payer: Self-pay

## 2020-01-03 VITALS — BP 142/70 | HR 71 | Temp 98.4°F | Ht 72.0 in | Wt 195.6 lb

## 2020-01-03 DIAGNOSIS — I10 Essential (primary) hypertension: Secondary | ICD-10-CM | POA: Diagnosis not present

## 2020-01-03 DIAGNOSIS — I480 Paroxysmal atrial fibrillation: Secondary | ICD-10-CM

## 2020-01-03 DIAGNOSIS — E782 Mixed hyperlipidemia: Secondary | ICD-10-CM

## 2020-01-03 NOTE — Progress Notes (Signed)
01/03/2020 Stanley Waters   1935-10-08  [id number]  Primary Physician Deland Pretty, MD Primary Cardiologist: Lorretta Harp MD FACP, [place], [place], [place]  HPI:  Stanley Waters is a 85 y.o.  mildly overweight widowed Caucasian male father of 16, grandfather of 2 grandchildren who I last saw  05/19/2019... He has a history of normal coronary arteries by cath in 2002. He has PAF and has undergone a transesophageal echo, most recently by Dr. [name] July 07, 2012. His other problems include hypertension and hyperlipidemia. I adjusted his medications because he was bradycardic when I last saw him and changed his Eliquis to Xarelto at his request because of cost. He apparently has developed a rash since that time which he attributes to the Xarelto. He was admitted to the hospital on 06/29/13 with A. Fib with RVR. Ultimately underwent bedside DC cardioversion by Dr. [name] successfully to sinus rhythm after being switched from Rythmol to amiodarone. Since discharge he had multiple episodes of right true PAF responsive to when necessary supplemental metoprolol. He wore an event monitor for 2 weeks but did show episodes of bradycardia.since I saw him last in September he has had no episodes of atrial fibrillation. His metoprolol was discontinued as was his verapamil. He has had heart rates in the 40s and is symptomatic from this with dizziness. He had a stress test performed9/15/16 which is low risk. Because of labile hypertension and bradycardia he saw Almyra Deforest PACin the office 08/14/16. Event monitor showed sinus rhythm with several episodes of sinus bradycardia in the 30s and 40s although he is for the most part asymptomatic from these.  I did refer him to Dr. Rayann Heman for consideration of A. fib ablation which occurred on 09/20/2018 successfully. Dr. Rayann Heman saw him back on 03/31/2019 at which time he was maintaining sinus rhythm and feeling clinically improved on Xarelto.  Since I saw  him 8 months ago he did test positive for COVID-19 10/26/2019 and had 2 weeks of fever and diffuse body aches which have since subsided.  He has had no recurrent episodes of atrial fibrillation and denies chest pain or shortness of breath..    Current Meds  Medication Sig  . acetaminophen (TYLENOL) 500 MG tablet Take 1,000 mg by mouth daily as needed for moderate pain or headache.  . Cholecalciferol (VITAMIN D3) 2000 units TABS Take 2,000 Units by mouth daily.  . Cyanocobalamin (B-12) 2500 MCG SUBL Place 1 tablet under the tongue daily.  [name] doxazosin (CARDURA) 8 MG tablet Take 1 tablet (8 mg total) by mouth at bedtime.  . furosemide (LASIX) 20 MG tablet Take 20 mg by mouth daily as needed for fluid.   [name] lisinopril (PRINIVIL,ZESTRIL) 20 MG tablet Take 20 mg by mouth daily as needed (if systolic bp is 0000000).   . Multiple Vitamins-Minerals (ICAPS) CAPS Take 1 capsule by mouth daily.   [name] MYRBETRIQ 25 MG TB24 tablet Take 1 tablet by mouth daily.  . Omega-3 Fatty Acids (FISH OIL) 1000 MG CAPS Take 1,000 mg by mouth at bedtime.  [name] [name] (SYSTANE OP) Place 1 drop into both eyes daily as needed (dry eyes).  . polyethylene glycol (MIRALAX / GLYCOLAX) packet Take 17 g by mouth daily as needed for moderate constipation.  . rivaroxaban (XARELTO) 20 MG TABS tablet Take 1 tablet (20 mg total) by mouth daily with supper.  [name] rOPINIRole (REQUIP) 0.25 MG tablet Take 0.25 mg by mouth at bedtime.   . simvastatin (  ZOCOR) 5 MG tablet Take 5 mg by mouth daily.   [name] triamcinolone cream (KENALOG) 0.1 % Apply 1 application topically 2 (two) times daily as needed (SKIN RASHES).      Allergies  Allergen Reactions  . Prednisone Swelling, Other (See Comments) and Hypertension    Tachycardia  . Doxycycline Rash    Social History   Socioeconomic History  . Marital status: Widowed    Spouse name: Not on file  . Number of children: 2  . Years of education: Not on file  . Highest education  level: Not on file  Occupational History    Employer: RETIRED    Comment: Landscape architect  Tobacco Use  . Smoking status: Former Smoker    Types: Cigarettes    Quit date: 11/03/1963    Years since quitting: 56.2  . Smokeless tobacco: Never Used  Substance and Sexual Activity  . Alcohol use: No  . Drug use: No  . Sexual activity: Not on file  Other Topics Concern  . Not on file  Social History Narrative   Lives in [place], alone   Retired from Landscape architect   Social Determinants of [place] Strain:   . Difficulty of Paying Living Expenses: Not on file  Food Insecurity:   . Worried About Charity fundraiser in the Last Year: Not on file  . Ran Out of Food in the Last Year: Not on file  Transportation Needs:   . Lack of Transportation (Medical): Not on file  . Lack of Transportation (Non-Medical): Not on file  Physical Activity:   . Days of Exercise per Week: Not on file  . Minutes of Exercise per Session: Not on file  Stress:   . Feeling of Stress : Not on file  Social Connections:   . Frequency of Communication with Friends and Family: Not on file  . Frequency of Social Gatherings with Friends and Family: Not on file  . Attends Religious Services: Not on file  . Active Member of Clubs or Organizations: Not on file  . Attends Archivist Meetings: Not on file  . Marital Status: Not on file  Intimate Partner Violence:   . Fear of Current or Ex-Partner: Not on file  . Emotionally Abused: Not on file  . Physically Abused: Not on file  . Sexually Abused: Not on file     Review of Systems: General: negative for chills, fever, night sweats or weight changes.  Cardiovascular: negative for chest pain, dyspnea on exertion, edema, orthopnea, palpitations, paroxysmal nocturnal dyspnea or shortness of breath Dermatological: negative for rash Respiratory: negative for cough or wheezing Urologic: negative for hematuria Abdominal: negative for  nausea, vomiting, diarrhea, bright red blood per rectum, melena, or hematemesis Neurologic: negative for visual changes, syncope, or dizziness All other systems reviewed and are otherwise negative except as noted above.    Blood pressure (!) 142/70, pulse 71, temperature 98.4 F (36.9 C), height 6' (1.829 m), weight 195 lb 9.6 oz (88.7 kg), SpO2 98 %.  General appearance: alert and no distress Neck: no adenopathy, no carotid bruit, no JVD, supple, symmetrical, trachea midline and thyroid not enlarged, symmetric, no tenderness/mass/nodules Lungs: clear to auscultation bilaterally Heart: regular rate and rhythm, S1, S2 normal, no murmur, click, rub or gallop Extremities: extremities normal, atraumatic, no cyanosis or edema Pulses: 2+ and symmetric Skin: Skin color, texture, turgor normal. No rashes or lesions Neurologic: Alert and oriented X 3, normal strength and tone. Normal symmetric  reflexes. Normal coordination and gait  EKG sinus rhythm at 60 with occasional PVC.  I personally reviewed this EKG.  ASSESSMENT AND PLAN:   Paroxysmal atrial fibrillation History of paroxysmal atrial fibrillation status post A. fib ablation by Dr. Rayann Heman successfully 09/20/2018 without recurrence.  He is on Xarelto.  Hyperlipidemia History of hyperlipidemia on statin therapy with lipid profile performed 02/20/2019 revealing total cholesterol 138, LDL 67 and HDL of 52.  Essential hypertension History of essential hypertension with blood pressure measured today at 142/70.  He is on Prinivil.      Lorretta Harp MD FACP,FACC,FAHA, [facility] 01/03/2020 11:28 AM

## 2020-01-03 NOTE — Patient Instructions (Signed)
Medication Instructions:  Your physician recommends that you continue on your current medications as directed. Please refer to the Current Medication list given to you today.  If you need a refill on your cardiac medications before your next appointment, please call your pharmacy.   Lab work: NONE  Testing/Procedures: NONE  Follow-Up: At [facility], you and your health needs are our priority.  As part of our continuing mission to provide you with exceptional heart care, we have created designated Provider Care Teams.  These Care Teams include your primary Cardiologist (physician) and Advanced Practice Providers (APPs -  Physician Assistants and Nurse Practitioners) who all work together to provide you with the care you need, when you need it. You may see Jonathan Berry, MD or one of the following Advanced Practice Providers on your designated Care Team:    Luke Kilroy, PA-C  Callie Goodrich, PA-C  Jesse Cleaver, FNP  Your physician wants you to follow-up in: 1 year with Dr. Berry You will receive a reminder letter in the mail two months in advance. If you don't receive a letter, please call our office to schedule the follow-up appointment.       

## 2020-01-03 NOTE — Assessment & Plan Note (Signed)
History of hyperlipidemia on statin therapy with lipid profile performed 02/20/2019 revealing total cholesterol 138, LDL 67 and HDL of 52.

## 2020-01-03 NOTE — Assessment & Plan Note (Signed)
History of paroxysmal atrial fibrillation status post A. fib ablation by Dr. Rayann Heman successfully 09/20/2018 without recurrence.  He is on Xarelto.

## 2020-01-03 NOTE — Assessment & Plan Note (Signed)
History of essential hypertension with blood pressure measured today at 142/70.  He is on Prinivil.

## 2020-01-30 DIAGNOSIS — R972 Elevated prostate specific antigen [PSA]: Secondary | ICD-10-CM | POA: Diagnosis not present

## 2020-02-01 ENCOUNTER — Other Ambulatory Visit: Payer: Self-pay | Admitting: Internal Medicine

## 2020-02-01 ENCOUNTER — Telehealth: Payer: Self-pay | Admitting: Cardiovascular Disease

## 2020-02-01 NOTE — Telephone Encounter (Signed)
Spoke to patient . Patient states he took a NTG  At 12:45pm  amd 1:10 pm. Medication relieved discomfort.  Patient also states he has an irregular heart rate. Patient is on  Xarelto - he states he has not missed a dose.  Not issue at present - appointment for 02/07/20 .  Patient aware if symptoms become worse contact 911 and go to ER.

## 2020-02-01 NOTE — Telephone Encounter (Signed)
New message     Patient states that his heart is "out of rhythm" and he is having chest tightness.  He took nitro and it has "eased" off.  He want an appt with Dr [name] for tomorrow.

## 2020-02-06 DIAGNOSIS — R351 Nocturia: Secondary | ICD-10-CM | POA: Diagnosis not present

## 2020-02-06 DIAGNOSIS — N401 Enlarged prostate with lower urinary tract symptoms: Secondary | ICD-10-CM | POA: Diagnosis not present

## 2020-02-06 DIAGNOSIS — R972 Elevated prostate specific antigen [PSA]: Secondary | ICD-10-CM | POA: Diagnosis not present

## 2020-02-07 ENCOUNTER — Ambulatory Visit: Payer: Medicare HMO | Admitting: Physician Assistant

## 2020-02-07 ENCOUNTER — Encounter: Payer: Self-pay | Admitting: Physician Assistant

## 2020-02-07 ENCOUNTER — Other Ambulatory Visit: Payer: Self-pay

## 2020-02-07 ENCOUNTER — Encounter: Payer: Self-pay | Admitting: *Deleted

## 2020-02-07 VITALS — BP 124/76 | HR 58 | Ht 72.0 in | Wt 193.0 lb

## 2020-02-07 DIAGNOSIS — R0789 Other chest pain: Secondary | ICD-10-CM | POA: Diagnosis not present

## 2020-02-07 DIAGNOSIS — I10 Essential (primary) hypertension: Secondary | ICD-10-CM | POA: Diagnosis not present

## 2020-02-07 DIAGNOSIS — Z7901 Long term (current) use of anticoagulants: Secondary | ICD-10-CM

## 2020-02-07 DIAGNOSIS — I480 Paroxysmal atrial fibrillation: Secondary | ICD-10-CM | POA: Diagnosis not present

## 2020-02-07 NOTE — Patient Instructions (Signed)
Medication Instructions:  Your physician recommends that you continue on your current medications as directed. Please refer to the Current Medication list given to you today.  *If you need a refill on your cardiac medications before your next appointment, please call your pharmacy*    Testing/Procedures: Your physician has requested that you have an echocardiogram. Echocardiography is a painless test that uses sound waves to create images of your heart. It provides your doctor with information about the size and shape of your heart and how well your heart's chambers and valves are working. This procedure takes approximately one hour. There are no restrictions for this procedure.   ZIO XT- Long Term Monitor Instructions   Your physician has requested you wear your ZIO patch monitor___14____days.   This is a single patch monitor.  [employer] supplies one patch monitor per enrollment.  Additional stickers are not available.   Please do not apply patch if you will be having a Nuclear Stress Test, Echocardiogram, Cardiac CT, MRI, or Chest Xray during the time frame you would be wearing the monitor. The patch cannot be worn during these tests.  You cannot remove and re-apply the ZIO XT patch monitor.   Your ZIO patch monitor will be sent USPS Priority mail from [facility] directly to your home address. The monitor may also be mailed to a PO BOX if home delivery is not available.   It may take 3-5 days to receive your monitor after you have been enrolled.   Once you have received you monitor, please review enclosed instructions.  Your monitor has already been registered assigning a specific monitor serial # to you.   Applying the monitor   Shave hair from upper left chest.   Hold abrader disc by orange tab.  Rub abrader in 40 strokes over left upper chest as indicated in your monitor instructions.   Clean area with 4 enclosed alcohol pads .  Use all pads to assure are is cleaned  thoroughly.  Let dry.   Apply patch as indicated in monitor instructions.  Patch will be place under collarbone on left side of chest with arrow pointing upward.   Rub patch adhesive wings for 2 minutes.Remove white label marked "1".  Remove white label marked "2".  Rub patch adhesive wings for 2 additional minutes.   While looking in a mirror, press and release button in center of patch.  A small green light will flash 3-4 times .  This will be your only indicator the monitor has been turned on.     Do not shower for the first 24 hours.  You may shower after the first 24 hours.   Press button if you feel a symptom. You will hear a small click.  Record Date, Time and Symptom in the Patient Log Book.   When you are ready to remove patch, follow instructions on last 2 pages of Patient Log Book.  Stick patch monitor onto last page of Patient Log Book.   Place Patient Log Book in [place] box.  Use locking tab on box and tape box closed securely.  The [employer] has [employer] on it.  Please place in mailbox as soon as possible.  Your physician should have your test results approximately 7 days after the monitor has been mailed back to [facility].   Call [facility] at [phone] if you have questions regarding your ZIO XT patch monitor.  Call them immediately if you see an orange light  blinking on your monitor.   If your monitor falls off in less than 4 days contact our Monitor department at [phone].  If your monitor becomes loose or falls off after 4 days call [employer] at [phone] for suggestions on securing your monitor.     Follow-Up: At [facility], you and your health needs are our priority.  As part of our continuing mission to provide you with exceptional heart care, we have created designated Provider Care Teams.  These Care Teams include your primary Cardiologist (physician) and Advanced Practice Providers (APPs -  Physician Assistants and  Nurse Practitioners) who all work together to provide you with the care you need, when you need it.  We recommend signing up for the patient portal called "MyChart".  Sign up information is provided on this After Visit Summary.  MyChart is used to connect with patients for Virtual Visits (Telemedicine).  Patients are able to view lab/test results, encounter notes, upcoming appointments, etc.  Non-urgent messages can be sent to your provider as well.   To learn more about what you can do with MyChart, go to [email or web address].    Your next appointment:   6-8 week(s)  The format for your next appointment:   In Person  Provider:   Quay Burow, MD

## 2020-02-07 NOTE — Progress Notes (Addendum)
Cardiology Office Note   Date:  02/07/2020   ID:  Stanley Waters, DOB 1935/08/07, MRN [id number]  PCP:  Deland Pretty, MD Cardiologist:  Quay Burow, MD 01/03/2020 Electrphysiologist: Thompson Grayer, MD Rosaria Ferries, PA-C   No chief complaint on file.   History of Present Illness: Stanley Waters is a 85 y.o. male with a history of Afib s/p ablation 09/2018, HTN, HLD, MV ok 2012, mild carotid dz 2014, BPH, mild thrombocytopenia, nl cors 2002, COVID 10/2019  04/01 phone notes re: irreg HR and CP>>appt made  Stanley Waters presents for cardiology evaluation.  Last Thursday, his heart was out of rhythm. On his home BP machine, his HR was in the 90s at times, then in the 60s. He felt his heart skipping and fluttering. He felt very bad, almost as bad as when he had COVID. He had chest tightness.   He was also having some chest tightness with the palpitations. It is in his chest, back and both shoulders. 5-6/10. He only has it when his heart is out of rhythm. He feels it a little now, with certain movements.  He has taken nitroglycerin for it, with some improvement.  He mows the yard, gets in firewood. Does not get CP with that. Some SOB, but not bad. Does not feel his breathing is bad.  Feels his breathing has recovered completely from having Covid.  Does not feel that bad today, but still not 100%.    Feels his heart skipping today at times, not nearly as bad as it was Thursday night.   Past Medical History:  Diagnosis Date  . Atrial flutter ([place])   . Atypical chest pain    Myoview performed 07/23/11 was completely normal. Post stress EF 61%.  . Bruit    Left asymptomatic bruit. Carotid Duplex 12/13/12 =mildly abnormal. *BILATERAL BULB/PROXIMAL ICAs: Demonstrated a mild amount of fibrous plaque w/no evidence od significant diameter reduction, tortuosity or other vascular abnormality.  . Enlarged prostate    BPH - PSA of 6.7 this is consistant with his last PSA of 6.3  .  Hyperlipemia   . Hypertension   . Inguinal hernia    Inguinal hernia repair (x) 2 1991, 2011  . Intestinal polyps 09/22/11   Colonoscopy removed a 2 mm sessile cecal polyp with a cold snare.  . Measles   . Mumps   . Normal coronary arteries 2002  . Peripheral neuropathy   . Persistent atrial fibrillation ([place])   . Seasonal allergies   . Skin cancer   . Thrombocytopenia ([place]) 2011   Very mild with platelets of 135,000.  [name] Tinnitus   . Umbilical hernia   . Vitamin D deficiency     Past Surgical History:  Procedure Laterality Date  . ATRIAL FIBRILLATION ABLATION  09/20/2018  . ATRIAL FIBRILLATION ABLATION N/A 09/20/2018   Procedure: ATRIAL FIBRILLATION ABLATION;  Surgeon: Thompson Grayer, MD;  Location: [facility];  Service: Cardiovascular;  Laterality: N/A;  . CARDIAC CATHETERIZATION  2002   "Normal cardiac catheterization"  . CARDIOVERSION  07/07/2012   A fib - Cardioversion successful.  [name] CARDIOVERSION N/A 06/30/2013   Procedure: CARDIOVERSION;  Surgeon: Sanda Klein, MD;  Location: [facility];  Service: Cardiovascular;  Laterality: N/A;  . CARDIOVERSION N/A 08/02/2018   Procedure: CARDIOVERSION;  Surgeon: Lelon Perla, MD;  Location: [facility] END[facility]COPY;  Service: Cardiovascular;  Laterality: N/A;  . Carotid Duplex  12/13/12   For asymptomatic bruit. Mildly abnormal.  *BILATERAL BULB/PROXIMAL  ICAs: Demonstrated a mild amount of fibrous plaque w/no evidence of significant diameter reduction, tortuosity or other vascular abnormality.   . COLONOSCOPY W/ POLYPECTOMY  09/22/11   2 mm sessile cecal polyp was removed with a cold snare.  [name] HAND SURGERY    . [place] & 2011  . TEE WITHOUT CARDIOVERSION  07/07/2012   Procedure: TRANSESOPHAGEAL ECHOCARDIOGRAM (TEE);  Surgeon: Pixie Casino, MD;  Location: [facility] ENDOSCOPY;  Service: Cardiovascular;  Laterality: N/A;  . TEE WITHOUT CARDIOVERSION N/A 08/02/2018   Procedure: TRANSESOPHAGEAL ECHOCARDIOGRAM (TEE);  Surgeon:  Lelon Perla, MD;  Location: [facility] ENDOSCOPY;  Service: Cardiovascular;  Laterality: N/A;    Current Outpatient Medications  Medication Sig Dispense Refill  . acetaminophen (TYLENOL) 500 MG tablet Take 1,000 mg by mouth daily as needed for moderate pain or headache.    . Cholecalciferol (VITAMIN D3) 2000 units TABS Take 2,000 Units by mouth daily.    . Cyanocobalamin (B-12) 2500 MCG SUBL Place 1 tablet under the tongue daily.    [name] doxazosin (CARDURA) 8 MG tablet Take 1 tablet (8 mg total) by mouth at bedtime. 90 tablet 3  . furosemide (LASIX) 20 MG tablet Take 20 mg by mouth daily as needed for fluid.     [name] lisinopril (PRINIVIL,ZESTRIL) 20 MG tablet Take 20 mg by mouth daily as needed (if systolic bp is 0000000).     . Multiple Vitamins-Minerals (ICAPS) CAPS Take 1 capsule by mouth daily.     [name] MYRBETRIQ 25 MG TB24 tablet Take 1 tablet by mouth daily.    . nitroGLYCERIN (NITROSTAT) 0.4 MG SL tablet PLACE 1 TABLET UNDER THE TONGUE EVERY 5 MINUTES AS NEEDED FOR CHEST PAIN 25 tablet 1  . Omega-3 Fatty Acids (FISH OIL) 1000 MG CAPS Take 1,000 mg by mouth at bedtime.    Stanley Waters [name] (SYSTANE OP) Place 1 drop into both eyes daily as needed (dry eyes).    . polyethylene glycol (MIRALAX / GLYCOLAX) packet Take 17 g by mouth daily as needed for moderate constipation.    . rivaroxaban (XARELTO) 20 MG TABS tablet Take 1 tablet (20 mg total) by mouth daily with supper. 90 tablet 2  . rOPINIRole (REQUIP) 0.25 MG tablet Take 0.25 mg by mouth at bedtime.     . simvastatin (ZOCOR) 5 MG tablet Take 5 mg by mouth daily.     [name] triamcinolone cream (KENALOG) 0.1 % Apply 1 application topically 2 (two) times daily as needed (SKIN RASHES).      No current facility-administered medications for this visit.    Allergies:   Prednisone and Doxycycline    Social History:  The patient  reports that he quit smoking about 56 years ago. His smoking use included cigarettes. He has never used smokeless  tobacco. He reports that he does not drink alcohol or use drugs.   Family History:  The patient's family history includes Heart failure in his brother.  He indicated that his mother is deceased. He indicated that his father is deceased. He indicated that his sister is deceased. He indicated that both of his brothers are deceased.    ROS:  Please see the history of present illness. All other systems are reviewed and negative.    PHYSICAL EXAM: VS:  BP 124/76   Pulse (!) 58   Ht 6' (1.829 m)   Wt 193 lb (87.5 kg)   BMI 26.18 kg/m  , BMI Body mass index is 26.18 kg/m. GEN:  Well nourished, well developed, male in no acute distress HEENT: normal for age  Neck: no JVD, no carotid bruit, no masses Cardiac: Slightly irregular rate and rhythm; soft murmur, no rubs, or gallops Respiratory:  clear to auscultation bilaterally, normal work of breathing GI: soft, nontender, nondistended, + BS MS: no deformity or atrophy; no edema; distal pulses are 2+ in all 4 extremities  Skin: warm and dry, no rash Neuro:  Strength and sensation are intact Psych: euthymic mood, full affect   EKG:  EKG is ordered today. The ECG ordered today demonstrates Sinus bradycardia with sinus arrhythmia, HR 58, no acute ischemic changes, first-degree AV block with PR interval 240 ms **Of note, he was having PVCs and PACs 6-10/min when hooked up to the monitor, not captured on ECG  ECHO: 08/18/2018 - Left ventricle: The cavity size was normal. There was mild  concentric hypertrophy. Systolic function was normal. The  estimated ejection fraction was in the range of 55% to 60%. Wall  motion was normal; there were no regional wall motion  abnormalities. There was a reduced contribution of atrial  contraction to ventricular filling, due to atrial contractile  dysfunction.  - Mitral valve: There was mild regurgitation.  - Left atrium: The atrium was severely dilated.  - Pulmonary arteries: Systolic  pressure was mildly increased. PA  peak pressure: 42 mm Hg (S).   MYOVIEW: 07/18/2015  Nuclear stress EF: 60%.  The left ventricular ejection fraction is normal (55-65%).  There was no ST segment deviation noted during stress.  Defect 1: There is a small defect of mild severity present in the basal inferoseptal and basal inferior location.  This is a low risk study.   Low risk lexiscan nuclear study demonstrating a small mild basal inferior-inferosepatal defect c/w diaphragmatic attenuation artifact. No ischemia. EF 60% with normal systolic thickening and wall motion.  Recent Labs: No results found for requested labs within last 8760 hours.  CBC    Component Value Date/Time   WBC 5.4 08/24/2018 0950   WBC 4.0 07/19/2018 1124   RBC 4.63 08/24/2018 0950   RBC 4.38 07/19/2018 1124   HGB 15.1 08/24/2018 0950   HCT 43.2 08/24/2018 0950   PLT 161 08/24/2018 0950   MCV 93 08/24/2018 0950   MCH 32.6 08/24/2018 0950   MCH 32.6 07/19/2018 1124   MCHC 35.0 08/24/2018 0950   MCHC 33.9 07/19/2018 1124   RDW 12.5 08/24/2018 0950   LYMPHSABS 0.7 08/24/2018 0950   MONOABS 0.5 06/29/2013 1032   EOSABS 0.2 08/24/2018 0950   BASOSABS 0.0 08/24/2018 0950   CMP Latest Ref Rng & Units 08/24/2018 08/02/2018 07/19/2018  Glucose 65 - 99 mg/dL 86 98 107(H)  BUN 8 - 27 mg/dL 17 - 15  Creatinine 0.76 - 1.27 mg/dL 1.14 - 1.13  Sodium 134 - 144 mmol/L 143 141 141  Potassium 3.5 - 5.2 mmol/L 3.6 3.6 3.6  Chloride 96 - 106 mmol/L 104 - 106  CO2 20 - 29 mmol/L 24 - 24  Calcium 8.6 - 10.2 mg/dL 9.2 - 8.9  Total Protein 6.0 - 8.5 g/dL - - -  Total Bilirubin 0.0 - 1.2 mg/dL - - -  Alkaline Phos 39 - 117 IU/L - - -  AST 0 - 40 IU/L - - -  ALT 0 - 44 IU/L - - -     Lipid Panel No results found for: CHOL, HDL, LDLCALC, LDLDIRECT, TRIG, CHOLHDL    Wt Readings from Last 3 Encounters:  02/07/20  193 lb (87.5 kg)  01/03/20 195 lb 9.6 oz (88.7 kg)  05/19/19 201 lb 12.8 oz (91.5 kg)     Other  studies Reviewed: Additional studies/ records that were reviewed today include: Office notes, hospital records and testing.  ASSESSMENT AND PLAN:  1.  Palpitations, history of atrial fib -He is having PVCs and PACs today. -However, I am very concerned that he was having atrial fibrillation last week, when his heart rate was going from 90-60, was generally up and down -He is to wear a ZIO monitor for 2 weeks -He had severe left atrial dilatation on his echocardiogram in 2019, recheck (right atrium normal). - he has resting bradycardia without any rate lowering medications, so will not add beta-blocker or CCB. -If atrial fib shows on the monitor, may need to follow-up with EP  2.  Chest tightness: -He exerts himself significantly doing yard work and carrying in wood for [employer].  He never has chest pain with this level of activity. -However, he does get chest pain, tightness, with the palpitations -Discussed getting a stress test with him, but he prefers to hold off. -He understands that if the chest tightness starts occurring with exertion or comes on more frequently, he needs to call us as he may need further testing. -If his EF is abnormal on echo, he needs a cath  3.  Hypertension: -He is on Cardura, lisinopril, and as needed Lasix. -Blood pressures well controlled on current medications  4.  Chronic anticoagulation: -He is compliant with the Xarelto, no bleeding issues   Current medicines are reviewed at length with the patient today.  The patient does not have concerns regarding medicines.  The following changes have been made:  no change  Labs/ tests ordered today include:   Orders Placed This Encounter  Procedures  . LONG TERM MONITOR (3-14 DAYS)  . EKG 12-Lead  . ECHOCARDIOGRAM COMPLETE     Disposition:   FU with Quay Burow, MD  Signed, Rosaria Ferries, PA-C  02/07/2020 9:51 AM    [place] Phone: [phone]; Fax: [phone]

## 2020-02-07 NOTE — Progress Notes (Signed)
Patient ID: Stanley Waters, male   DOB: 1935-06-06, 85 y.o.   MRN: [id number] Patient enrolled for [employer] to ship a 14 day ZIO XT long term holter monitor to his home.

## 2020-02-10 ENCOUNTER — Ambulatory Visit (INDEPENDENT_AMBULATORY_CARE_PROVIDER_SITE_OTHER): Payer: Medicare HMO

## 2020-02-10 DIAGNOSIS — I480 Paroxysmal atrial fibrillation: Secondary | ICD-10-CM

## 2020-02-22 ENCOUNTER — Other Ambulatory Visit: Payer: Self-pay

## 2020-02-22 ENCOUNTER — Ambulatory Visit (HOSPITAL_COMMUNITY): Payer: Medicare HMO | Attending: Cardiovascular Disease

## 2020-02-22 DIAGNOSIS — I480 Paroxysmal atrial fibrillation: Secondary | ICD-10-CM | POA: Diagnosis not present

## 2020-02-26 DIAGNOSIS — E785 Hyperlipidemia, unspecified: Secondary | ICD-10-CM | POA: Diagnosis not present

## 2020-02-29 DIAGNOSIS — D696 Thrombocytopenia, unspecified: Secondary | ICD-10-CM | POA: Diagnosis not present

## 2020-02-29 DIAGNOSIS — N400 Enlarged prostate without lower urinary tract symptoms: Secondary | ICD-10-CM | POA: Diagnosis not present

## 2020-02-29 DIAGNOSIS — J309 Allergic rhinitis, unspecified: Secondary | ICD-10-CM | POA: Diagnosis not present

## 2020-02-29 DIAGNOSIS — D6869 Other thrombophilia: Secondary | ICD-10-CM | POA: Diagnosis not present

## 2020-02-29 DIAGNOSIS — M791 Myalgia, unspecified site: Secondary | ICD-10-CM | POA: Diagnosis not present

## 2020-02-29 DIAGNOSIS — I2510 Atherosclerotic heart disease of native coronary artery without angina pectoris: Secondary | ICD-10-CM | POA: Diagnosis not present

## 2020-02-29 DIAGNOSIS — Z0000 Encounter for general adult medical examination without abnormal findings: Secondary | ICD-10-CM | POA: Diagnosis not present

## 2020-02-29 DIAGNOSIS — D709 Neutropenia, unspecified: Secondary | ICD-10-CM | POA: Diagnosis not present

## 2020-02-29 DIAGNOSIS — M8580 Other specified disorders of bone density and structure, unspecified site: Secondary | ICD-10-CM | POA: Diagnosis not present

## 2020-03-01 ENCOUNTER — Telehealth: Payer: Self-pay | Admitting: Cardiovascular Disease

## 2020-03-01 NOTE — Telephone Encounter (Signed)
Patient returning Stanley Waters call from yesterday in regards to echo results.

## 2020-03-01 NOTE — Telephone Encounter (Signed)
Reviewed echo results with pt. Pt verbalized understanding with no other questions at this time.

## 2020-03-18 DIAGNOSIS — I480 Paroxysmal atrial fibrillation: Secondary | ICD-10-CM | POA: Diagnosis not present

## 2020-03-20 ENCOUNTER — Other Ambulatory Visit: Payer: Self-pay

## 2020-03-20 ENCOUNTER — Ambulatory Visit: Payer: Medicare HMO | Admitting: Cardiovascular Disease

## 2020-03-20 ENCOUNTER — Encounter: Payer: Self-pay | Admitting: Cardiovascular Disease

## 2020-03-20 DIAGNOSIS — I480 Paroxysmal atrial fibrillation: Secondary | ICD-10-CM

## 2020-03-20 NOTE — Progress Notes (Signed)
03/20/2020 Stanley Waters   [birth year]  [id number]  Primary Physician Deland Pretty, MD Primary Cardiologist: Lorretta Harp MD FACP, [place], [place], [place]  HPI:  Stanley Waters is a 85 y.o.  mildly overweight widowed Caucasian male father of 64, grandfather of 2 grandchildren who I last saw3/01/2020.He has a history of normal coronary arteries by cath in 2002. He has PAF and has undergone a transesophageal echo, most recently by Dr. [name] July 07, 2012. His other problems include hypertension and hyperlipidemia. I adjusted his medications because he was bradycardic when I last saw him and changed his Eliquis to Xarelto at his request because of cost. He apparently has developed a rash since that time which he attributes to the Xarelto. He was admitted to the hospital on 06/29/13 with A. Fib with RVR. Ultimately underwent bedside DC cardioversion by Dr. [name] successfully to sinus rhythm after being switched from Rythmol to amiodarone. Since discharge he had multiple episodes of right true PAF responsive to when necessary supplemental metoprolol. He wore an event monitor for 2 weeks but did show episodes of bradycardia.since I saw him last in September he has had no episodes of atrial fibrillation. His metoprolol was discontinued as was his verapamil. He has had heart rates in the 40s and is symptomatic from this with dizziness. He had a stress test performed9/15/16 which is low risk. Because of labile hypertension and bradycardia he saw Almyra Deforest PACin the office 08/14/16. Event monitor showed sinus rhythm with several episodes of sinus bradycardia in the 30s and 40s although he is for the most part asymptomatic from these.  I did refer him to Dr. Rayann Heman for consideration of A. fib ablation which occurred on 09/20/2018 successfully. Dr. Rayann Heman saw him back on5/29/2020at which time he was maintaining sinus rhythm and feeling clinically improved on Xarelto.   He did test  positive for COVID-19 10/26/2019 and had 2 weeks of fever and diffuse body aches which have since subsided.   Since I saw him 01/03/2020 he has had the Covid vaccine.  After the first dose he developed night responsive chest pain and episodes of PAF and had recurrent episodes of PAF after the second dose.  He did see Rosaria Ferries, PA-C in the office 02/07/2020 which time he was in sinus rhythm.  He said no recurrent A. fib since that time.  Current Meds  Medication Sig  . acetaminophen (TYLENOL) 500 MG tablet Take 1,000 mg by mouth daily as needed for moderate pain or headache.  . Cholecalciferol (VITAMIN D3) 2000 units TABS Take 2,000 Units by mouth daily.  . Cyanocobalamin (B-12) 2500 MCG SUBL Place 1 tablet under the tongue daily.  [name] doxazosin (CARDURA) 8 MG tablet Take 1 tablet (8 mg total) by mouth at bedtime.  . furosemide (LASIX) 20 MG tablet Take 20 mg by mouth daily as needed for fluid.   [name] lisinopril (PRINIVIL,ZESTRIL) 20 MG tablet Take 20 mg by mouth daily as needed (if systolic bp is 0000000).   . Multiple Vitamins-Minerals (ICAPS) CAPS Take 1 capsule by mouth daily.   [name] MYRBETRIQ 25 MG TB24 tablet Take 1 tablet by mouth daily.  . nitroGLYCERIN (NITROSTAT) 0.4 MG SL tablet PLACE 1 TABLET UNDER THE TONGUE EVERY 5 MINUTES AS NEEDED FOR CHEST PAIN  . Omega-3 Fatty Acids (FISH OIL) 1000 MG CAPS Take 1,000 mg by mouth at bedtime.  [name] [name] (SYSTANE OP) Place 1 drop into both eyes daily as needed (dry  eyes).  . polyethylene glycol (MIRALAX / GLYCOLAX) packet Take 17 g by mouth daily as needed for moderate constipation.  . rivaroxaban (XARELTO) 20 MG TABS tablet Take 1 tablet (20 mg total) by mouth daily with supper.  [name] rOPINIRole (REQUIP) 0.25 MG tablet Take 0.25 mg by mouth at bedtime.   . simvastatin (ZOCOR) 5 MG tablet Take 5 mg by mouth daily.   [name] triamcinolone cream (KENALOG) 0.1 % Apply 1 application topically 2 (two) times daily as needed (SKIN RASHES).       Allergies  Allergen Reactions  . Prednisone Swelling, Other (See Comments) and Hypertension    Tachycardia  . Doxycycline Rash    Social History   Socioeconomic History  . Marital status: Widowed    Spouse name: Not on file  . Number of children: 2  . Years of education: Not on file  . Highest education level: Not on file  Occupational History    Employer: RETIRED    Comment: Landscape architect  Tobacco Use  . Smoking status: Former Smoker    Types: Cigarettes    Quit date: 11/03/1963    Years since quitting: 56.4  . Smokeless tobacco: Never Used  Substance and Sexual Activity  . Alcohol use: No  . Drug use: No  . Sexual activity: Not on file  Other Topics Concern  . Not on file  Social History Narrative   Lives in [place], alone   Retired from Landscape architect   Social Determinants of Radio broadcast assistant Strain:   . Difficulty of Paying Living Expenses:   Food Insecurity:   . Worried About Charity fundraiser in the Last Year:   . Arboriculturist in the Last Year:   Transportation Needs:   . Film/video editor (Medical):   [name] Lack of Transportation (Non-Medical):   Physical Activity:   . Days of Exercise per Week:   . Minutes of Exercise per Session:   Stress:   . Feeling of Stress :   Social Connections:   . Frequency of Communication with Friends and Family:   . Frequency of Social Gatherings with Friends and Family:   . Attends Religious Services:   . Active Member of Clubs or Organizations:   . Attends Archivist Meetings:   [name] Marital Status:   Intimate Partner Violence:   . Fear of Current or Ex-Partner:   . Emotionally Abused:   [name] Physically Abused:   . Sexually Abused:      Review of Systems: General: negative for chills, fever, night sweats or weight changes.  Cardiovascular: negative for chest pain, dyspnea on exertion, edema, orthopnea, palpitations, paroxysmal nocturnal dyspnea or shortness of  breath Dermatological: negative for rash Respiratory: negative for cough or wheezing Urologic: negative for hematuria Abdominal: negative for nausea, vomiting, diarrhea, bright red blood per rectum, melena, or hematemesis Neurologic: negative for visual changes, syncope, or dizziness All other systems reviewed and are otherwise negative except as noted above.    Blood pressure (!) 164/80, pulse (!) 57, height 6' (1.829 m), weight 196 lb (88.9 kg), SpO2 99 %.  General appearance: alert and no distress Neck: no adenopathy, no carotid bruit, no JVD, supple, symmetrical, trachea midline and thyroid not enlarged, symmetric, no tenderness/mass/nodules Lungs: clear to auscultation bilaterally Heart: regular rate and rhythm, S1, S2 normal, no murmur, click, rub or gallop Extremities: extremities normal, atraumatic, no cyanosis or edema Pulses: 2+ and symmetric Skin: Skin color, texture, turgor normal. No  rashes or lesions Neurologic: Alert and oriented X 3, normal strength and tone. Normal symmetric reflexes. Normal coordination and gait  EKG not performed today  ASSESSMENT AND PLAN:   Paroxysmal atrial fibrillation History of paroxysmal atrial fibrillation status post A. fib ablation by Dr. Rayann Heman 09/20/2018 successfully.  Has been on Xarelto oral anticoagulation maintaining sinus rhythm.  Apparently he did have COVID-19 10/26/2019 with symptoms that lasted for 2 weeks.  He had the [place] Covid vaccine earlier this year after which she developed some chest pain and recurrent PAF both with the first and second vaccines.  Since that time he has had no recurrent symptoms.      Lorretta Harp MD FACP,FACC,FAHA, [facility] 03/20/2020 10:05 AM

## 2020-03-20 NOTE — Assessment & Plan Note (Signed)
History of paroxysmal atrial fibrillation status post A. fib ablation by Dr. Rayann Heman 09/20/2018 successfully.  Has been on Xarelto oral anticoagulation maintaining sinus rhythm.  Apparently he did have COVID-19 10/26/2019 with symptoms that lasted for 2 weeks.  He had the [place] Covid vaccine earlier this year after which she developed some chest pain and recurrent PAF both with the first and second vaccines.  Since that time he has had no recurrent symptoms.

## 2020-03-20 NOTE — Patient Instructions (Signed)
Medication Instructions:  The current medical regimen is effective;  continue present plan and medications.  *If you need a refill on your cardiac medications before your next appointment, please call your pharmacy*    Follow-Up: At [facility], you and your health needs are our priority.  As part of our continuing mission to provide you with exceptional heart care, we have created designated Provider Care Teams.  These Care Teams include your primary Cardiologist (physician) and Advanced Practice Providers (APPs -  Physician Assistants and Nurse Practitioners) who all work together to provide you with the care you need, when you need it.  We recommend signing up for the patient portal called "MyChart".  Sign up information is provided on this After Visit Summary.  MyChart is used to connect with patients for Virtual Visits (Telemedicine).  Patients are able to view lab/test results, encounter notes, upcoming appointments, etc.  Non-urgent messages can be sent to your provider as well.   To learn more about what you can do with MyChart, go to [email or web address].    Your next appointment:   6 months with Rosaria Ferries, PA-C 12 months with Quay Burow, MD

## 2020-05-17 DIAGNOSIS — M2041 Other hammer toe(s) (acquired), right foot: Secondary | ICD-10-CM | POA: Diagnosis not present

## 2020-05-17 DIAGNOSIS — M79672 Pain in left foot: Secondary | ICD-10-CM | POA: Diagnosis not present

## 2020-05-17 DIAGNOSIS — M2042 Other hammer toe(s) (acquired), left foot: Secondary | ICD-10-CM | POA: Diagnosis not present

## 2020-05-17 DIAGNOSIS — M79671 Pain in right foot: Secondary | ICD-10-CM | POA: Diagnosis not present

## 2020-05-17 DIAGNOSIS — L97522 Non-pressure chronic ulcer of other part of left foot with fat layer exposed: Secondary | ICD-10-CM | POA: Diagnosis not present

## 2020-05-29 ENCOUNTER — Telehealth: Payer: Self-pay | Admitting: Cardiovascular Disease

## 2020-05-29 NOTE — Telephone Encounter (Signed)
Spoke with patient to try schedule follow up with Barrett from recall list patient stated he didn't want appointment at the moment

## 2020-07-12 DIAGNOSIS — M25512 Pain in left shoulder: Secondary | ICD-10-CM | POA: Diagnosis not present

## 2020-07-12 DIAGNOSIS — I10 Essential (primary) hypertension: Secondary | ICD-10-CM | POA: Diagnosis not present

## 2020-07-12 DIAGNOSIS — M8580 Other specified disorders of bone density and structure, unspecified site: Secondary | ICD-10-CM | POA: Diagnosis not present

## 2020-07-12 DIAGNOSIS — I2510 Atherosclerotic heart disease of native coronary artery without angina pectoris: Secondary | ICD-10-CM | POA: Diagnosis not present

## 2020-07-12 DIAGNOSIS — M25569 Pain in unspecified knee: Secondary | ICD-10-CM | POA: Diagnosis not present

## 2020-07-12 DIAGNOSIS — M25561 Pain in right knee: Secondary | ICD-10-CM | POA: Diagnosis not present

## 2020-07-12 DIAGNOSIS — M1990 Unspecified osteoarthritis, unspecified site: Secondary | ICD-10-CM | POA: Diagnosis not present

## 2020-07-12 DIAGNOSIS — M25562 Pain in left knee: Secondary | ICD-10-CM | POA: Diagnosis not present

## 2020-07-12 DIAGNOSIS — M25462 Effusion, left knee: Secondary | ICD-10-CM | POA: Diagnosis not present

## 2020-07-12 DIAGNOSIS — M109 Gout, unspecified: Secondary | ICD-10-CM | POA: Diagnosis not present

## 2020-07-12 DIAGNOSIS — D696 Thrombocytopenia, unspecified: Secondary | ICD-10-CM | POA: Diagnosis not present

## 2020-07-12 DIAGNOSIS — M25511 Pain in right shoulder: Secondary | ICD-10-CM | POA: Diagnosis not present

## 2020-07-12 DIAGNOSIS — M25519 Pain in unspecified shoulder: Secondary | ICD-10-CM | POA: Diagnosis not present

## 2020-07-25 DIAGNOSIS — M25462 Effusion, left knee: Secondary | ICD-10-CM | POA: Diagnosis not present

## 2020-07-25 DIAGNOSIS — I2510 Atherosclerotic heart disease of native coronary artery without angina pectoris: Secondary | ICD-10-CM | POA: Diagnosis not present

## 2020-07-25 DIAGNOSIS — M8580 Other specified disorders of bone density and structure, unspecified site: Secondary | ICD-10-CM | POA: Diagnosis not present

## 2020-07-25 DIAGNOSIS — M1990 Unspecified osteoarthritis, unspecified site: Secondary | ICD-10-CM | POA: Diagnosis not present

## 2020-07-25 DIAGNOSIS — M25569 Pain in unspecified knee: Secondary | ICD-10-CM | POA: Diagnosis not present

## 2020-07-25 DIAGNOSIS — M25519 Pain in unspecified shoulder: Secondary | ICD-10-CM | POA: Diagnosis not present

## 2020-07-25 DIAGNOSIS — M109 Gout, unspecified: Secondary | ICD-10-CM | POA: Diagnosis not present

## 2020-07-25 DIAGNOSIS — M791 Myalgia, unspecified site: Secondary | ICD-10-CM | POA: Diagnosis not present

## 2020-07-25 DIAGNOSIS — K219 Gastro-esophageal reflux disease without esophagitis: Secondary | ICD-10-CM | POA: Diagnosis not present

## 2020-08-07 DIAGNOSIS — L97522 Non-pressure chronic ulcer of other part of left foot with fat layer exposed: Secondary | ICD-10-CM | POA: Diagnosis not present

## 2020-08-07 DIAGNOSIS — M2041 Other hammer toe(s) (acquired), right foot: Secondary | ICD-10-CM | POA: Diagnosis not present

## 2020-08-07 DIAGNOSIS — L97511 Non-pressure chronic ulcer of other part of right foot limited to breakdown of skin: Secondary | ICD-10-CM | POA: Diagnosis not present

## 2020-08-07 DIAGNOSIS — Z20828 Contact with and (suspected) exposure to other viral communicable diseases: Secondary | ICD-10-CM | POA: Diagnosis not present

## 2020-08-07 DIAGNOSIS — M2042 Other hammer toe(s) (acquired), left foot: Secondary | ICD-10-CM | POA: Diagnosis not present

## 2020-08-13 DIAGNOSIS — M8580 Other specified disorders of bone density and structure, unspecified site: Secondary | ICD-10-CM | POA: Diagnosis not present

## 2020-08-13 DIAGNOSIS — M791 Myalgia, unspecified site: Secondary | ICD-10-CM | POA: Diagnosis not present

## 2020-08-13 DIAGNOSIS — M25462 Effusion, left knee: Secondary | ICD-10-CM | POA: Diagnosis not present

## 2020-08-13 DIAGNOSIS — M109 Gout, unspecified: Secondary | ICD-10-CM | POA: Diagnosis not present

## 2020-08-13 DIAGNOSIS — M25569 Pain in unspecified knee: Secondary | ICD-10-CM | POA: Diagnosis not present

## 2020-08-13 DIAGNOSIS — D696 Thrombocytopenia, unspecified: Secondary | ICD-10-CM | POA: Diagnosis not present

## 2020-08-13 DIAGNOSIS — I2510 Atherosclerotic heart disease of native coronary artery without angina pectoris: Secondary | ICD-10-CM | POA: Diagnosis not present

## 2020-08-13 DIAGNOSIS — M1990 Unspecified osteoarthritis, unspecified site: Secondary | ICD-10-CM | POA: Diagnosis not present

## 2020-08-13 DIAGNOSIS — K219 Gastro-esophageal reflux disease without esophagitis: Secondary | ICD-10-CM | POA: Diagnosis not present

## 2020-09-20 DIAGNOSIS — R972 Elevated prostate specific antigen [PSA]: Secondary | ICD-10-CM | POA: Diagnosis not present

## 2020-09-20 DIAGNOSIS — R350 Frequency of micturition: Secondary | ICD-10-CM | POA: Diagnosis not present

## 2020-09-20 DIAGNOSIS — R3912 Poor urinary stream: Secondary | ICD-10-CM | POA: Diagnosis not present

## 2020-09-20 DIAGNOSIS — N401 Enlarged prostate with lower urinary tract symptoms: Secondary | ICD-10-CM | POA: Diagnosis not present

## 2020-09-30 DIAGNOSIS — M25562 Pain in left knee: Secondary | ICD-10-CM | POA: Diagnosis not present

## 2020-10-16 DIAGNOSIS — L97522 Non-pressure chronic ulcer of other part of left foot with fat layer exposed: Secondary | ICD-10-CM | POA: Diagnosis not present

## 2020-10-23 DIAGNOSIS — M25562 Pain in left knee: Secondary | ICD-10-CM | POA: Diagnosis not present

## 2020-10-28 DIAGNOSIS — M25562 Pain in left knee: Secondary | ICD-10-CM | POA: Diagnosis not present

## 2021-01-01 ENCOUNTER — Other Ambulatory Visit: Payer: Self-pay | Admitting: Internal Medicine

## 2021-01-01 DIAGNOSIS — R1030 Lower abdominal pain, unspecified: Secondary | ICD-10-CM | POA: Diagnosis not present

## 2021-01-01 DIAGNOSIS — R1032 Left lower quadrant pain: Secondary | ICD-10-CM | POA: Diagnosis not present

## 2021-01-27 ENCOUNTER — Other Ambulatory Visit: Payer: Self-pay | Admitting: Surgery

## 2021-01-27 DIAGNOSIS — R1032 Left lower quadrant pain: Secondary | ICD-10-CM | POA: Diagnosis not present

## 2021-01-27 DIAGNOSIS — R1031 Right lower quadrant pain: Secondary | ICD-10-CM | POA: Diagnosis not present

## 2021-01-28 ENCOUNTER — Other Ambulatory Visit: Payer: Self-pay | Admitting: Surgery

## 2021-01-28 ENCOUNTER — Other Ambulatory Visit: Payer: Medicare HMO | Admitting: Surgery

## 2021-01-28 DIAGNOSIS — R1032 Left lower quadrant pain: Secondary | ICD-10-CM

## 2021-01-28 DIAGNOSIS — R1031 Right lower quadrant pain: Secondary | ICD-10-CM

## 2021-02-13 ENCOUNTER — Ambulatory Visit
Admission: RE | Admit: 2021-02-13 | Discharge: 2021-02-13 | Disposition: A | Discharge status: Home / Self Care | Payer: Medicare HMO | Source: Ambulatory Visit | Attending: Surgery | Admitting: Surgery

## 2021-02-13 ENCOUNTER — Other Ambulatory Visit: Payer: Self-pay

## 2021-02-13 DIAGNOSIS — R1032 Left lower quadrant pain: Secondary | ICD-10-CM

## 2021-02-13 DIAGNOSIS — R1031 Right lower quadrant pain: Secondary | ICD-10-CM

## 2021-02-13 DIAGNOSIS — K5730 Diverticulosis of large intestine without perforation or abscess without bleeding: Secondary | ICD-10-CM | POA: Diagnosis not present

## 2021-02-13 MED ORDER — IOPAMIDOL (ISOVUE-300) INJECTION 61%
100.0000 mL | Freq: Once | INTRAVENOUS | Status: AC | PRN
  Administered 2021-02-13: 100 mL via INTRAVENOUS

## 2021-03-03 DIAGNOSIS — G629 Polyneuropathy, unspecified: Secondary | ICD-10-CM | POA: Diagnosis not present

## 2021-03-03 DIAGNOSIS — I10 Essential (primary) hypertension: Secondary | ICD-10-CM | POA: Diagnosis not present

## 2021-03-03 DIAGNOSIS — E785 Hyperlipidemia, unspecified: Secondary | ICD-10-CM | POA: Diagnosis not present

## 2021-03-03 DIAGNOSIS — E559 Vitamin D deficiency, unspecified: Secondary | ICD-10-CM | POA: Diagnosis not present

## 2021-03-06 DIAGNOSIS — I10 Essential (primary) hypertension: Secondary | ICD-10-CM | POA: Diagnosis not present

## 2021-03-06 DIAGNOSIS — R1030 Lower abdominal pain, unspecified: Secondary | ICD-10-CM | POA: Diagnosis not present

## 2021-03-06 DIAGNOSIS — I4891 Unspecified atrial fibrillation: Secondary | ICD-10-CM | POA: Diagnosis not present

## 2021-03-06 DIAGNOSIS — K5900 Constipation, unspecified: Secondary | ICD-10-CM | POA: Diagnosis not present

## 2021-03-06 DIAGNOSIS — I2510 Atherosclerotic heart disease of native coronary artery without angina pectoris: Secondary | ICD-10-CM | POA: Diagnosis not present

## 2021-03-06 DIAGNOSIS — D696 Thrombocytopenia, unspecified: Secondary | ICD-10-CM | POA: Diagnosis not present

## 2021-03-06 DIAGNOSIS — M109 Gout, unspecified: Secondary | ICD-10-CM | POA: Diagnosis not present

## 2021-03-06 DIAGNOSIS — Z0001 Encounter for general adult medical examination with abnormal findings: Secondary | ICD-10-CM | POA: Diagnosis not present

## 2021-03-06 DIAGNOSIS — E785 Hyperlipidemia, unspecified: Secondary | ICD-10-CM | POA: Diagnosis not present

## 2021-03-06 DIAGNOSIS — D6869 Other thrombophilia: Secondary | ICD-10-CM | POA: Diagnosis not present

## 2021-03-21 ENCOUNTER — Ambulatory Visit: Payer: Medicare HMO | Admitting: Cardiovascular Disease

## 2021-03-21 ENCOUNTER — Encounter: Payer: Self-pay | Admitting: Cardiovascular Disease

## 2021-03-21 ENCOUNTER — Other Ambulatory Visit: Payer: Self-pay

## 2021-03-21 VITALS — BP 130/68 | HR 73 | Ht 72.0 in | Wt 202.4 lb

## 2021-03-21 DIAGNOSIS — I10 Essential (primary) hypertension: Secondary | ICD-10-CM | POA: Diagnosis not present

## 2021-03-21 DIAGNOSIS — I480 Paroxysmal atrial fibrillation: Secondary | ICD-10-CM

## 2021-03-21 DIAGNOSIS — E782 Mixed hyperlipidemia: Secondary | ICD-10-CM

## 2021-03-21 NOTE — Progress Notes (Signed)
03/21/2021 Stanley Waters   [birth year]  [id number]  Primary Physician Stanley Pretty, MD Primary Cardiologist: Stanley Harp MD FACP, [place], [place], [place]  HPI:  Stanley Waters is a 86 y.o.  mildly overweight widowed Caucasian male father of 51, grandfather of 2 grandchildren who I last 03/20/2020.He has a history of normal coronary arteries by cath in 2002. He has PAF and has undergone a transesophageal echo, most recently by Dr. Mali Waters July 07, 2012. His other problems include hypertension and hyperlipidemia. I adjusted his medications because he was bradycardic when I last saw him and changed his Eliquis to Xarelto at his request because of cost. He apparently has developed a rash since that time which he attributes to the Xarelto. He was admitted to the hospital on 06/29/13 with A. Fib with RVR. Ultimately underwent bedside DC cardioversion by Dr. Sallyanne Waters successfully to sinus rhythm after being switched from Rythmol to amiodarone. Since discharge he had multiple episodes of right true PAF responsive to when necessary supplemental metoprolol. He wore an event monitor for 2 weeks but did show episodes of bradycardia.since I saw him last in September he has had no episodes of atrial fibrillation. His metoprolol was discontinued as was his verapamil. He has had heart rates in the 40s and is symptomatic from this with dizziness. He had a stress test performed9/15/16 which is low risk. Because of labile hypertension and bradycardia he saw Stanley Waters PACin the office 08/14/16. Event monitor showed sinus rhythm with several episodes of sinus bradycardia in the 30s and 40s although he is for the most part asymptomatic from these.  I did refer him to Dr. Rayann Waters for consideration of A. fib ablation which occurred on 09/20/2018 successfully. Dr. Rayann Waters saw him back on5/29/2020at which time he was maintaining sinus rhythm and feeling clinically improved on Xarelto.   He did test positive  for COVID-19 10/26/2019 and had 2 weeks of fever and diffuse body aches which have since subsided.   He did have a COVID-vaccine prior to me seeing him a year ago..  After the first dose he developed night responsive chest pain and episodes of PAF and had recurrent episodes of PAF after the second dose.  He did see Stanley Ferries, PA-C in the office 02/07/2020 which time he was in sinus rhythm.  He said no recurrent A. fib since that time.  He had an event monitor that showed frequent PACs and PVCs, short runs of SVT and NSVT on 02/10/2020 and a 2D echo performed on 02/22/2020 that was essentially normal except he did have a thoracic aorta measuring 43 mm.  Since I saw him a year ago he continues to do well.  He denies chest pain or shortness of breath.    Current Meds  Medication Sig  . acetaminophen (TYLENOL) 500 MG tablet Take 1,000 mg by mouth daily as needed for moderate pain or headache.  . Cholecalciferol (VITAMIN D3) 2000 units TABS Take 2,000 Units by mouth daily.  . Cyanocobalamin (B-12) 2500 MCG SUBL Place 1 tablet under the tongue daily.  [name] doxazosin (CARDURA) 8 MG tablet Take 1 tablet (8 mg total) by mouth at bedtime.  . furosemide (LASIX) 20 MG tablet Take 20 mg by mouth daily as needed for fluid.   [name] lisinopril (PRINIVIL,ZESTRIL) 20 MG tablet Take 20 mg by mouth daily as needed (if systolic bp is 967).   . Multiple Vitamins-Minerals (ICAPS) CAPS Take 1 capsule by mouth daily.   [name]  nitroGLYCERIN (NITROSTAT) 0.4 MG SL tablet PLACE 1 TABLET UNDER THE TONGUE EVERY 5 MINUTES AS NEEDED FOR CHEST PAIN  . Omega-3 Fatty Acids (FISH OIL) 1000 MG CAPS Take 1,000 mg by mouth at bedtime.  [name] [name] (SYSTANE OP) Place 1 drop into both eyes daily as needed (dry eyes).  . polyethylene glycol (MIRALAX / GLYCOLAX) packet Take 17 g by mouth daily as needed for moderate constipation.  . rivaroxaban (XARELTO) 20 MG TABS tablet Take 1 tablet (20 mg total) by mouth daily with supper.   [name] rOPINIRole (REQUIP) 0.25 MG tablet Take 0.25 mg by mouth at bedtime.   . simvastatin (ZOCOR) 5 MG tablet Take 5 mg by mouth daily.   [name] triamcinolone cream (KENALOG) 0.1 % Apply 1 application topically 2 (two) times daily as needed (SKIN RASHES).   . [DISCONTINUED] MYRBETRIQ 25 MG TB24 tablet Take 1 tablet by mouth daily.     Allergies  Allergen Reactions  . Prednisone Swelling, Other (See Comments) and Hypertension    Tachycardia  . Doxycycline Rash    Social History   Socioeconomic History  . Marital status: Widowed    Spouse name: Not on file  . Number of children: 2  . Years of education: Not on file  . Highest education level: Not on file  Occupational History    Employer: RETIRED    Comment: Landscape architect  Tobacco Use  . Smoking status: Former Smoker    Types: Cigarettes    Quit date: 11/03/1963    Years since quitting: 57.4  . Smokeless tobacco: Never Used  Substance and Sexual Activity  . Alcohol use: No  . Drug use: No  . Sexual activity: Not on file  Other Topics Concern  . Not on file  Social History Narrative   Lives in [place], alone   Retired from Landscape architect   Social Determinants of Radio broadcast assistant Strain: Not on file  Food Insecurity: Not on file  Transportation Needs: Not on file  Physical Activity: Not on file  Stress: Not on file  Social Connections: Not on file  Intimate Partner Violence: Not on file     Review of Systems: General: negative for chills, fever, night sweats or weight changes.  Cardiovascular: negative for chest pain, dyspnea on exertion, edema, orthopnea, palpitations, paroxysmal nocturnal dyspnea or shortness of breath Dermatological: negative for rash Respiratory: negative for cough or wheezing Urologic: negative for hematuria Abdominal: negative for nausea, vomiting, diarrhea, bright red blood per rectum, melena, or hematemesis Neurologic: negative for visual changes, syncope, or dizziness All  other systems reviewed and are otherwise negative except as noted above.    Blood pressure 130/68, pulse 73, height 6' (1.829 m), weight 202 lb 6.4 oz (91.8 kg), SpO2 98 %.  General appearance: alert and no distress Neck: no adenopathy, no carotid bruit, no JVD, supple, symmetrical, trachea midline and thyroid not enlarged, symmetric, no tenderness/mass/nodules Lungs: clear to auscultation bilaterally Heart: regular rate and rhythm, S1, S2 normal, no murmur, click, rub or gallop Extremities: extremities normal, atraumatic, no cyanosis or edema Pulses: 2+ and symmetric Skin: Skin color, texture, turgor normal. No rashes or lesions Neurologic: Alert and oriented X 3, normal strength and tone. Normal symmetric reflexes. Normal coordination and gait  EKG sinus rhythm at 73 with occasional PVCs.  I personally reviewed this EKG.  ASSESSMENT AND PLAN:   Paroxysmal atrial fibrillation History of PAF status post A. fib ablation by Dr. Rayann Waters 09/20/2018 successfully.  He  said no recurrence.  He remains on Xarelto oral anticoagulation.  Hyperlipidemia History of hyperlipidemia on statin therapy with lipid profile performed 02/26/2020 revealing total cholesterol 138, LDL 63 and HDL 62, followed by his PCP.  Essential hypertension History of essential hypertension blood pressure measured today 130/68.  He is on lisinopril.      Stanley Harp MD FACP,FACC,FAHA, [facility] 03/21/2021 11:48 AM

## 2021-03-21 NOTE — Assessment & Plan Note (Signed)
History of hyperlipidemia on statin therapy with lipid profile performed 02/26/2020 revealing total cholesterol 138, LDL 63 and HDL 62, followed by his PCP.

## 2021-03-21 NOTE — Patient Instructions (Addendum)
Medication Instructions:  Your physician recommends that you continue on your current medications as directed. Please refer to the Current Medication list given to you today.  *If you need a refill on your cardiac medications before your next appointment, please call your pharmacy*   Testing/Procedures: Your physician has requested that you have an echocardiogram. Echocardiography is a painless test that uses sound waves to create images of your heart. It provides your doctor with information about the size and shape of your heart and how well your heart's chambers and valves are working. This procedure takes approximately one hour. There are no restrictions for this procedure. This procedure is done at [address]rd [address]. To be done April 2023.   Follow-Up: At [facility], you and your health needs are our priority.  As part of our continuing mission to provide you with exceptional heart care, we have created designated Provider Care Teams.  These Care Teams include your primary Cardiologist (physician) and Advanced Practice Providers (APPs -  Physician Assistants and Nurse Practitioners) who all work together to provide you with the care you need, when you need it.  We recommend signing up for the patient portal called "MyChart".  Sign up information is provided on this After Visit Summary.  MyChart is used to connect with patients for Virtual Visits (Telemedicine).  Patients are able to view lab/test results, encounter notes, upcoming appointments, etc.  Non-urgent messages can be sent to your provider as well.   To learn more about what you can do with MyChart, go to [email or web address].    Your next appointment:   12 month(s)  The format for your next appointment:   In Person  Provider:   Quay Burow, MD

## 2021-03-21 NOTE — Progress Notes (Signed)
Medication samples have been provided to the patient.  Drug name: Xarelto  Qty:1     LOT:  [id number] Exp.Date: 02/2023

## 2021-03-21 NOTE — Assessment & Plan Note (Signed)
History of essential hypertension blood pressure measured today 130/68.  He is on lisinopril.

## 2021-03-21 NOTE — Assessment & Plan Note (Signed)
History of PAF status post A. fib ablation by Dr. Rayann Heman 09/20/2018 successfully.  He said no recurrence.  He remains on Xarelto oral anticoagulation.

## 2021-03-25 DIAGNOSIS — E559 Vitamin D deficiency, unspecified: Secondary | ICD-10-CM | POA: Diagnosis not present

## 2021-03-25 DIAGNOSIS — R1030 Lower abdominal pain, unspecified: Secondary | ICD-10-CM | POA: Diagnosis not present

## 2021-03-27 ENCOUNTER — Telehealth: Payer: Self-pay | Admitting: Internal Medicine

## 2021-03-27 NOTE — Progress Notes (Deleted)
.   PCP:  Deland Pretty, MD Primary Cardiologist: Quay Burow, MD Electrophysiologist: Thompson Grayer, MD   Stanley Waters is a 86 y.o. male seen today for Thompson Grayer, MD for {Blank single:19197::"cardiac clearance","post hospital follow up","acute visit due to ***","routine electrophysiology followup"}.  Since {Blank single:19197::"last being seen in our clinic","discharge from hospital"} the patient reports doing ***.  he denies chest pain, palpitations, dyspnea, PND, orthopnea, nausea, vomiting, dizziness, syncope, edema, weight gain, or early satiety.  Past Medical History:  Diagnosis Date  . Atrial flutter ([place])   . Atypical chest pain    Myoview performed 07/23/11 was completely normal. Post stress EF 61%.  . Bruit    Left asymptomatic bruit. Carotid Duplex 12/13/12 =mildly abnormal. *BILATERAL BULB/PROXIMAL ICAs: Demonstrated a mild amount of fibrous plaque w/no evidence od significant diameter reduction, tortuosity or other vascular abnormality.  . Enlarged prostate    BPH - PSA of 6.7 this is consistant with his last PSA of 6.3  . Hyperlipemia   . Hypertension   . Inguinal hernia    Inguinal hernia repair (x) 2 1991, 2011  . Intestinal polyps 09/22/11   Colonoscopy removed a 2 mm sessile cecal polyp with a cold snare.  . Measles   . Mumps   . Normal coronary arteries 2002  . Peripheral neuropathy   . Persistent atrial fibrillation ([place])   . Seasonal allergies   . Skin cancer   . Thrombocytopenia ([place]) 2011   Very mild with platelets of 135,000.  [name] Tinnitus   . Umbilical hernia   . Vitamin D deficiency    Past Surgical History:  Procedure Laterality Date  . ATRIAL FIBRILLATION ABLATION  09/20/2018  . ATRIAL FIBRILLATION ABLATION N/A 09/20/2018   Procedure: ATRIAL FIBRILLATION ABLATION;  Surgeon: Thompson Grayer, MD;  Location: [place] [facility];  Service: Cardiovascular;  Laterality: N/A;  . CARDIAC CATHETERIZATION  2002   "Normal cardiac catheterization"  .  CARDIOVERSION  07/07/2012   A fib - Cardioversion successful.  [name] CARDIOVERSION N/A 06/30/2013   Procedure: CARDIOVERSION;  Surgeon: Sanda Klein, MD;  Location: [facility];  Service: Cardiovascular;  Laterality: N/A;  . CARDIOVERSION N/A 08/02/2018   Procedure: CARDIOVERSION;  Surgeon: Lelon Perla, MD;  Location: [facility]COPY;  Service: Cardiovascular;  Laterality: N/A;  . Carotid Duplex  12/13/12   For asymptomatic bruit. Mildly abnormal.  *BILATERAL BULB/PROXIMAL ICAs: Demonstrated a mild amount of fibrous plaque w/no evidence of significant diameter reduction, tortuosity or other vascular abnormality.   . COLONOSCOPY W/ POLYPECTOMY  09/22/11   2 mm sessile cecal polyp was removed with a cold snare.  [name] HAND SURGERY    . [employer] & 2011  . TEE WITHOUT CARDIOVERSION  07/07/2012   Procedure: TRANSESOPHAGEAL ECHOCARDIOGRAM (TEE);  Surgeon: Pixie Casino, MD;  Location: [facility] ENDOSCOPY;  Service: Cardiovascular;  Laterality: N/A;  . TEE WITHOUT CARDIOVERSION N/A 08/02/2018   Procedure: TRANSESOPHAGEAL ECHOCARDIOGRAM (TEE);  Surgeon: Lelon Perla, MD;  Location: [facility] ENDOSCOPY;  Service: Cardiovascular;  Laterality: N/A;    Current Outpatient Medications  Medication Sig Dispense Refill  . acetaminophen (TYLENOL) 500 MG tablet Take 1,000 mg by mouth daily as needed for moderate pain or headache.    . Cholecalciferol (VITAMIN D3) 2000 units TABS Take 2,000 Units by mouth daily.    . Cyanocobalamin (B-12) 2500 MCG SUBL Place 1 tablet under the tongue daily.    [name] doxazosin (CARDURA) 8 MG tablet Take 1 tablet (8 mg total) by  mouth at bedtime. 90 tablet 3  . furosemide (LASIX) 20 MG tablet Take 20 mg by mouth daily as needed for fluid.     [name] lisinopril (PRINIVIL,ZESTRIL) 20 MG tablet Take 20 mg by mouth daily as needed (if systolic bp is 678).     . Multiple Vitamins-Minerals (ICAPS) CAPS Take 1 capsule by mouth daily.     . nitroGLYCERIN (NITROSTAT) 0.4 MG SL tablet PLACE 1 TABLET  UNDER THE TONGUE EVERY 5 MINUTES AS NEEDED FOR CHEST PAIN 25 tablet 1  . Omega-3 Fatty Acids (FISH OIL) 1000 MG CAPS Take 1,000 mg by mouth at bedtime.    [name] [name] (SYSTANE OP) Place 1 drop into both eyes daily as needed (dry eyes).    . polyethylene glycol (MIRALAX / GLYCOLAX) packet Take 17 g by mouth daily as needed for moderate constipation.    . rivaroxaban (XARELTO) 20 MG TABS tablet Take 1 tablet (20 mg total) by mouth daily with supper. 90 tablet 2  . rOPINIRole (REQUIP) 0.25 MG tablet Take 0.25 mg by mouth at bedtime.     . simvastatin (ZOCOR) 5 MG tablet Take 5 mg by mouth daily.     [name] triamcinolone cream (KENALOG) 0.1 % Apply 1 application topically 2 (two) times daily as needed (SKIN RASHES).      No current facility-administered medications for this visit.    Allergies  Allergen Reactions  . Prednisone Swelling, Other (See Comments) and Hypertension    Tachycardia  . Doxycycline Rash    Social History   Socioeconomic History  . Marital status: Widowed    Spouse name: Not on file  . Number of children: 2  . Years of education: Not on file  . Highest education level: Not on file  Occupational History    Employer: RETIRED    Comment: Landscape architect  Tobacco Use  . Smoking status: Former Smoker    Types: Cigarettes    Quit date: 11/03/1963    Years since quitting: 57.4  . Smokeless tobacco: Never Used  Substance and Sexual Activity  . Alcohol use: No  . Drug use: No  . Sexual activity: Not on file  Other Topics Concern  . Not on file  Social History Narrative   Lives in [place], alone   Retired from Landscape architect   Social Determinants of Radio broadcast assistant Strain: Not on file  Food Insecurity: Not on file  Transportation Needs: Not on file  Physical Activity: Not on file  Stress: Not on file  Social Connections: Not on file  Intimate Partner Violence: Not on file     Review of Systems: General: No chills,  fever, night sweats or weight changes  Cardiovascular:  No chest pain, dyspnea on exertion, edema, orthopnea, palpitations, paroxysmal nocturnal dyspnea Dermatological: No rash, lesions or masses Respiratory: No cough, dyspnea Urologic: No hematuria, dysuria Abdominal: No nausea, vomiting, diarrhea, bright red blood per rectum, melena, or hematemesis Neurologic: No visual changes, weakness, changes in mental status All other systems reviewed and are otherwise negative except as noted above.  Physical Exam: There were no vitals filed for this visit.  GEN- The patient is well appearing, alert and oriented x 3 today.   HEENT: normocephalic, atraumatic; sclera clear, conjunctiva pink; hearing intact; oropharynx clear; neck supple, no JVP Lymph- no cervical lymphadenopathy Lungs- Clear to ausculation bilaterally, normal work of breathing.  No wheezes, rales, rhonchi Heart- Regular rate and rhythm, no murmurs, rubs or gallops, PMI not laterally  displaced GI- soft, non-tender, non-distended, bowel sounds present, no hepatosplenomegaly Extremities- no clubbing, cyanosis, or edema; DP/PT/radial pulses 2+ bilaterally MS- no significant deformity or atrophy Skin- warm and dry, no rash or lesion Psych- euthymic mood, full affect Neuro- strength and sensation are intact  EKG is not ordered. Personal review of EKG from 03/21/2021 shows NSR at 73 bpm with PVC and PAC  Additional studies reviewed include: Previous EP office notes Echo 02/2020 LVEF 60-65% Monitor 03/2020 showed "SR/SB/ST, frequent PACs/PVCs, occasional short NSVT/SVT"  Assessment and Plan:  1. Persistent atrial fibrillation / mitral annular flutter S/p ablation *** Currently off AAD therapy On Xarelto for CHA2DS2VASC of at least 4.    2. HTN Continue lisinopril  Shirley Friar, [place]  03/27/21 5:21 PM

## 2021-03-27 NOTE — Telephone Encounter (Signed)
Spoke with pt and went into a fib this am at 5:00 HR was 100 checked a little later and was 73 checked again around 11:30 and was 76 Per pt no symptoms has been in and out of afib for 20 years had ablation in 2019 with Dr Rayann Heman  Appt made with  Oda Kilts PA for tom at 12:35 Will forward to [facility] for review ./cy

## 2021-03-27 NOTE — Telephone Encounter (Signed)
Patient c/o Palpitations:  High priority if patient c/o lightheadedness, shortness of breath, or chest pain  1) How long have you had palpitations/irregular HR/ Afib? Are you having the symptoms now?  Patient states he went into afib this morning around 5:00 AM. He reports he is still currently out of rhythm He scheduled an appointment for tomorrow, 03/28/21 at 11:20 AM with Oda Kilts, PA.  2) Are you currently experiencing lightheadedness, SOB or CP?  No  3) Do you have a history of afib (atrial fibrillation) or irregular heart rhythm? Yes   4) Have you checked your BP or HR? (document readings if available):  111/63 100  76  5) Are you experiencing any other symptoms?  No

## 2021-03-28 ENCOUNTER — Ambulatory Visit: Payer: Medicare HMO | Admitting: Student

## 2021-04-01 ENCOUNTER — Other Ambulatory Visit: Payer: Self-pay

## 2021-04-01 DIAGNOSIS — I2510 Atherosclerotic heart disease of native coronary artery without angina pectoris: Secondary | ICD-10-CM | POA: Insufficient documentation

## 2021-04-01 DIAGNOSIS — E785 Hyperlipidemia, unspecified: Secondary | ICD-10-CM | POA: Diagnosis not present

## 2021-04-01 DIAGNOSIS — K5900 Constipation, unspecified: Secondary | ICD-10-CM | POA: Insufficient documentation

## 2021-04-01 DIAGNOSIS — N400 Enlarged prostate without lower urinary tract symptoms: Secondary | ICD-10-CM | POA: Insufficient documentation

## 2021-04-01 DIAGNOSIS — R1030 Lower abdominal pain, unspecified: Secondary | ICD-10-CM | POA: Insufficient documentation

## 2021-04-01 DIAGNOSIS — M109 Gout, unspecified: Secondary | ICD-10-CM | POA: Insufficient documentation

## 2021-04-01 DIAGNOSIS — M8580 Other specified disorders of bone density and structure, unspecified site: Secondary | ICD-10-CM | POA: Insufficient documentation

## 2021-04-01 DIAGNOSIS — I10 Essential (primary) hypertension: Secondary | ICD-10-CM | POA: Diagnosis not present

## 2021-04-01 DIAGNOSIS — M6208 Separation of muscle (nontraumatic), other site: Secondary | ICD-10-CM | POA: Insufficient documentation

## 2021-04-01 DIAGNOSIS — Z720 Tobacco use: Secondary | ICD-10-CM | POA: Insufficient documentation

## 2021-04-01 DIAGNOSIS — D696 Thrombocytopenia, unspecified: Secondary | ICD-10-CM | POA: Insufficient documentation

## 2021-04-01 DIAGNOSIS — J309 Allergic rhinitis, unspecified: Secondary | ICD-10-CM | POA: Insufficient documentation

## 2021-04-01 DIAGNOSIS — L602 Onychogryphosis: Secondary | ICD-10-CM | POA: Insufficient documentation

## 2021-04-01 DIAGNOSIS — G629 Polyneuropathy, unspecified: Secondary | ICD-10-CM | POA: Insufficient documentation

## 2021-04-01 DIAGNOSIS — G2581 Restless legs syndrome: Secondary | ICD-10-CM | POA: Diagnosis not present

## 2021-04-01 DIAGNOSIS — D6859 Other primary thrombophilia: Secondary | ICD-10-CM | POA: Insufficient documentation

## 2021-04-01 DIAGNOSIS — M1990 Unspecified osteoarthritis, unspecified site: Secondary | ICD-10-CM | POA: Insufficient documentation

## 2021-04-01 DIAGNOSIS — E559 Vitamin D deficiency, unspecified: Secondary | ICD-10-CM | POA: Insufficient documentation

## 2021-04-01 DIAGNOSIS — K219 Gastro-esophageal reflux disease without esophagitis: Secondary | ICD-10-CM | POA: Insufficient documentation

## 2021-04-04 ENCOUNTER — Encounter: Payer: Self-pay | Admitting: Nurse Practitioner

## 2021-04-04 ENCOUNTER — Ambulatory Visit: Payer: Medicare HMO | Admitting: Nurse Practitioner

## 2021-04-04 VITALS — BP 130/70 | HR 94 | Ht 72.0 in | Wt 200.6 lb

## 2021-04-04 DIAGNOSIS — R102 Pelvic and perineal pain: Secondary | ICD-10-CM | POA: Diagnosis not present

## 2021-04-04 DIAGNOSIS — R1030 Lower abdominal pain, unspecified: Secondary | ICD-10-CM | POA: Diagnosis not present

## 2021-04-04 NOTE — Progress Notes (Signed)
04/04/2021 Stanley Waters [id number] 1935/01/02   CHIEF COMPLAINT: Lower abdominal pain   HISTORY OF PRESENT ILLNESS:  Stanley Waters is an 86 year old male with a past medical history of hypertension, CAD, atrial flutter on Xarelto, peripheral neuropathy, thrombocytopenia and colon polyps. He presents to our office today as referred by Dr. Deland Waters for further evaluation for lower abdominal pain which started 6 months ago.  He stated his lower abdominal pain is hard to explain, it feels as if something is inside of him which should not be there.  He notices this lower abdominal pain mostly when he bends forward.  This pain occurs to the right and left lower abdomen and radiates across to the center lower abdomen at times.  Eating does not trigger this pain.  He is passing normal formed brown bowel movement daily as long as he takes MiraLAX.  His lower abdominal pain does not improve or worsen after defecation.  No rectal pain.  An abdominal/pelvic CT scan with contrast was done 02/13/2021 which identified extensive diverticulosis in the sigmoid colon without evidence of diverticulitis, surgical mesh to the left inguinal area consistent with past hernia surgery was noted and a small right inguinal hernia containing fat was identified.  A new low-density structure involving the inferior spleen, possibly a splenic cyst vs indeterminate lesion. Prostate was enlarged measuring 6.3 x 5.7 x 7.1 cm.  Labs done 03/03/2021 showed WBC 4.3.  Hemoglobin 15.5.  Hematocrit 45.6.  Platelet 118.  BUN 16.  Creatinine 0.99.  Alk phos 64.  Total bili 0.9.  AST 15.  ALT 14.  He underwent a colonoscopy 09/22/2011 which the patient reported was normal without polyps, however, epic records indicates a 2 mm sessile polyp was removed from the cecum.  I am unable to locate the actual colonoscopy procedure report in epic.  He underwent a colonoscopy 11/21/2003 by Dr. Jaymes Waters which showed diverticulosis in the sigmoid colon, no  polyps.  No known family history of colon cancer.  He has an enlarged prostate.  He typically urinates 3 times nightly, however, 2 weeks ago he urinated every 15 minutes x 15 1 night without recurrence.  No hematuria.  Last saw his urologist at least 6 months ago.  He underwent left and little hernia surgery in the past.  He stated he recently saw his surgeon who did not feel his lower abdominal pain was related to his past inguinal hernia surgeries.   Colonoscopy 09/22/2011: 72mm sessile polyp removed from the cecum Note procedure report not found in epic  Colonoscopy 11/21/2003 by Dr. Lajoyce Waters:  1. Thickened sigmoid colon with diverticulosis.  2. Otherwise, an unremarkable examination.  3. Await biopsy report.  4. The patient will call me for results and follow up with me as an     outpatient.  EGD 11/21/2003: Mild erythema of gastric antrum and body, decreased peristalsis.  CTAP 02/13/2021: 1. No acute abnormality in the abdomen or pelvis. 2. Postsurgical changes from left inguinal hernia repair. No complicating features at the surgical repair. 3. Small right inguinal hernia containing fat. 4. Prostate enlargement. 5. New low-density structure involving the inferior spleen with water attenuation. This could represent a splenic cyst but indeterminate. No inflammatory changes around this splenic low-density structure. 6. Small areas of nodularity and interstitial thickening at the lung bases. Findings are suggestive for post inflammatory changes 7.Extensive diverticulosis in the sigmoid colon.  ECHO 02/22/2020: 1. Left ventricular ejection fraction, by estimation, is 60 to  65%. The left ventricle has normal function. The left ventricle has no regional wall motion abnormalities. Left ventricular diastolic parameters were normal. 2. Right ventricular systolic function is normal. The right ventricular size is normal. 3. Left atrial size was moderately dilated. 4. The mitral valve is normal in  structure. No evidence of mitral valve regurgitation. No evidence of mitral stenosis. 5. The aortic valve is tricuspid. Aortic valve regurgitation is not visualized. Mild to moderate aortic valve sclerosis/calcification is present, without any evidence of aortic stenosis. 6. Dilated sinus and root 4.3 cm. Aortic dilatation noted. There is mild to moderate dilatation at the level of the sinuses of Valsalva measuring 43 mm. 7. The inferior vena cava is normal in size with greater than 50% respiratory variability, suggesting right atrial pressure of 3 mmHg.    Past Medical History:  Diagnosis Date  . Atrial flutter ([place])   . Atypical chest pain    Myoview performed 07/23/11 was completely normal. Post stress EF 61%.  . Bruit    Left asymptomatic bruit. Carotid Duplex 12/13/12 =mildly abnormal. *BILATERAL BULB/PROXIMAL ICAs: Demonstrated a mild amount of fibrous plaque w/no evidence od significant diameter reduction, tortuosity or other vascular abnormality.  . Enlarged prostate    BPH - PSA of 6.7 this is consistant with his last PSA of 6.3  . Hyperlipemia   . Hypertension   . Inguinal hernia    Inguinal hernia repair (x) 2 1991, 2011  . Intestinal polyps 09/22/11   Colonoscopy removed a 2 mm sessile cecal polyp with a cold snare.  . Measles   . Mumps   . Normal coronary arteries 2002  . Peripheral neuropathy   . Persistent atrial fibrillation ([place])   . Seasonal allergies   . Skin cancer   . Thrombocytopenia ([place]) 2011   Very mild with platelets of 135,000.  [name] Tinnitus   . Umbilical hernia   . Vitamin D deficiency    Past Surgical History:  Procedure Laterality Date  . ATRIAL FIBRILLATION ABLATION  09/20/2018  . ATRIAL FIBRILLATION ABLATION N/A 09/20/2018   Procedure: ATRIAL FIBRILLATION ABLATION;  Surgeon: Stanley Grayer, MD;  Location: [place] [facility];  Service: Cardiovascular;  Laterality: N/A;  . CARDIAC CATHETERIZATION  2002   "Normal cardiac catheterization"  .  CARDIOVERSION  07/07/2012   A fib - Cardioversion successful.  [name] CARDIOVERSION N/A 06/30/2013   Procedure: CARDIOVERSION;  Surgeon: Stanley Klein, MD;  Location: [facility];  Service: Cardiovascular;  Laterality: N/A;  . CARDIOVERSION N/A 08/02/2018   Procedure: CARDIOVERSION;  Surgeon: Lelon Perla, MD;  Location: [facility] ENDOSCOPY;  Service: Cardiovascular;  Laterality: N/A;  . Carotid Duplex  12/13/12   For asymptomatic bruit. Mildly abnormal.  *BILATERAL BULB/PROXIMAL ICAs: Demonstrated a mild amount of fibrous plaque w/no evidence of significant diameter reduction, tortuosity or other vascular abnormality.   . COLONOSCOPY W/ POLYPECTOMY  09/22/11   2 mm sessile cecal polyp was removed with a cold snare.  [name] HAND SURGERY    . [place] & 2011  . TEE WITHOUT CARDIOVERSION  07/07/2012   Procedure: TRANSESOPHAGEAL ECHOCARDIOGRAM (TEE);  Surgeon: Pixie Casino, MD;  Location: [facility] ENDOSCOPY;  Service: Cardiovascular;  Laterality: N/A;  . TEE WITHOUT CARDIOVERSION N/A 08/02/2018   Procedure: TRANSESOPHAGEAL ECHOCARDIOGRAM (TEE);  Surgeon: Lelon Perla, MD;  Location: [facility] ENDOSCOPY;  Service: Cardiovascular;  Laterality: N/A;   Social History: He is widowed. He has one son and one stepdaughter. He smoked cigarettes 1ppd x 10 to  12 years quit smoking 57 years ago. No alcohol x 57 years.   Family History: Brother with prostate cancer.  Brother with lung cancer.  Mother deceased at the age of 84 with history of cerebral hemorrhage.  Father deceased age 17.    Allergies  Allergen Reactions  . Prednisone Swelling, Other (See Comments) and Hypertension    Tachycardia  . Doxycycline Rash      Outpatient Encounter Medications as of 04/04/2021  Medication Sig  . acetaminophen (TYLENOL) 500 MG tablet Take 1,000 mg by mouth daily as needed for moderate pain or headache.  . Cholecalciferol (VITAMIN D3) 2000 units TABS Take 2,000 Units by mouth daily.  . Cyanocobalamin (B-12) 2500 MCG  SUBL Place 1 tablet under the tongue daily.  [name] doxazosin (CARDURA) 8 MG tablet Take 1 tablet (8 mg total) by mouth at bedtime.  . furosemide (LASIX) 20 MG tablet Take 20 mg by mouth daily as needed for fluid.   [name] lisinopril (PRINIVIL,ZESTRIL) 20 MG tablet Take 20 mg by mouth daily as needed (if systolic bp is 941).   . Multiple Vitamins-Minerals (ICAPS) CAPS Take 1 capsule by mouth daily.   . nitroGLYCERIN (NITROSTAT) 0.4 MG SL tablet PLACE 1 TABLET UNDER THE TONGUE EVERY 5 MINUTES AS NEEDED FOR CHEST PAIN  . Omega-3 Fatty Acids (FISH OIL) 1000 MG CAPS Take 1,000 mg by mouth at bedtime.  [name] [name] (SYSTANE OP) Place 1 drop into both eyes daily as needed (dry eyes).  . polyethylene glycol (MIRALAX / GLYCOLAX) packet Take 17 g by mouth daily as needed for moderate constipation.  . rivaroxaban (XARELTO) 20 MG TABS tablet Take 1 tablet (20 mg total) by mouth daily with supper.  [name] rOPINIRole (REQUIP) 0.25 MG tablet Take 0.25 mg by mouth at bedtime.   . simvastatin (ZOCOR) 5 MG tablet Take 5 mg by mouth daily.   [name] triamcinolone cream (KENALOG) 0.1 % Apply 1 application topically 2 (two) times daily as needed (SKIN RASHES).    No facility-administered encounter medications on file as of 04/04/2021.   REVIEW OF SYSTEMS: Gen: Denies fever, sweats or chills. No weight loss.  CV: Denies chest pain, palpitations or edema. Resp: Denies cough, shortness of breath of hemoptysis.  GI: + constipation well controlled on Miralax. Denies heartburn, dysphagia, stomach or lower abdominal pain.  GU:  See HPI.  MS: Denies joint pain, muscles aches or weakness. Derm: Denies rash, itchiness, skin lesions or unhealing ulcers. Psych: Denies depression, anxiety, memory loss, suicidal ideation and confusion. Heme: Denies bruising, bleeding. Neuro:  Denies headaches, dizziness or paresthesias. Endo:  Denies any problems with DM, thyroid or adrenal function.  PHYSICAL EXAM: BP 130/70   Pulse 94    Ht 6' (1.829 m)   Wt 200 lb 9.6 oz (91 kg)   SpO2 98%   BMI 27.21 kg/m   General: 86 year old male in no acute distress. Head: Normocephalic and atraumatic. Eyes:  Sclerae non-icteric, conjunctive pink. Ears: Normal auditory acuity. Mouth: Dentition intact. No ulcers or lesions.  Neck: Supple, no lymphadenopathy or thyromegaly.  Lungs: Clear bilaterally to auscultation without wheezes, crackles or rhonchi. Heart: Regular rate and rhythm. No murmur, rub or gallop appreciated.  Abdomen: Soft, protuberant, non distended. Very mild suprapubic tenderness when patient is standing and bends forward. No hernia appreciated. Mild left inguinal tenderness. No masses. No hepatosplenomegaly. Normoactive bowel sounds x 4 quadrants.  Rectal: Deferred.  Musculoskeletal: Symmetrical with no gross deformities. Skin: Warm and dry. No rash  or lesions on visible extremities. Extremities: No edema. Neurological: Alert oriented x 4, no focal deficits.  Psychological:  Alert and cooperative. Normal mood and affect.  ASSESSMENT AND PLAN:  1. Lower abdominal vs pelvic pain, positional. CTAP with contrast showed extensive diverticulosis to the sigmoid colon, a small right inguinal hernia containing fat may be contributing to some of his positional discomfort. On exam today, he had mild suprapubic tenderness and mild tenderness to the left groin area.  -Pelvic MRI w/wo contrast, patient declined MRI for now -Continue Miralax Q HS -Tylenol $RemoveBef'500mg'[id number]$  2 tabs po bid PRN -Heating pad PRN  2. Questionable history of colon polyps in 2012, patient denies having any known history of colon polyps -No plans for a diagnostic colonoscopy at this time   3. BPH. Patient reported urinating Q 15 minutes x 15 episodes during the night about 2 weeks ago. No dysuria or hematuria.  -Follow up with urologist   4. Chronic thrombocytopenia, CTAP 02/14/2021 showed a 3cm splenic lesion, splenic cyst vs indeterminate lesion -Consider  hematology evaluation, defer decision to Dr. Deland Waters    CC:  Stanley Pretty, MD

## 2021-04-04 NOTE — Patient Instructions (Addendum)
If you are age 84 or older, your body mass index should be between 23-30. Your Body mass index is 27.21 kg/m. If this is out of the aforementioned range listed, please consider follow up with your Primary Care Provider.  RECOMMENDATIONS: Please call our office when you are ready to schedule the pelvic MRI. Follow up with your Urologist. Take Tylenol 500 MG, 2 tablets twice a day. Use a heating pad as needed.  It was great seeing you today! Thank you for entrusting me with your care and choosing [facility].  Noralyn Pick, CRNP  The [facility] providers would like to encourage you to use [facility] to communicate with providers for non-urgent requests or questions.  Due to long hold times on the telephone, sending your provider a message by [facility] may be faster and more efficient way to get a response. Please allow 48 business hours for a response.  Please remember that this is for non-urgent requests/questions.

## 2021-04-07 NOTE — Progress Notes (Signed)
I agree with the above note, plan 

## 2021-04-09 ENCOUNTER — Ambulatory Visit: Payer: Medicare HMO | Admitting: Student

## 2021-05-14 DIAGNOSIS — D734 Cyst of spleen: Secondary | ICD-10-CM | POA: Diagnosis not present

## 2021-05-14 DIAGNOSIS — G4700 Insomnia, unspecified: Secondary | ICD-10-CM | POA: Diagnosis not present

## 2021-05-14 DIAGNOSIS — M25552 Pain in left hip: Secondary | ICD-10-CM | POA: Diagnosis not present

## 2021-05-22 DIAGNOSIS — G4700 Insomnia, unspecified: Secondary | ICD-10-CM | POA: Diagnosis not present

## 2021-05-22 DIAGNOSIS — M810 Age-related osteoporosis without current pathological fracture: Secondary | ICD-10-CM | POA: Diagnosis not present

## 2021-05-22 DIAGNOSIS — M8589 Other specified disorders of bone density and structure, multiple sites: Secondary | ICD-10-CM | POA: Diagnosis not present

## 2021-05-22 DIAGNOSIS — Z23 Encounter for immunization: Secondary | ICD-10-CM | POA: Diagnosis not present

## 2021-05-22 DIAGNOSIS — I10 Essential (primary) hypertension: Secondary | ICD-10-CM | POA: Diagnosis not present

## 2021-05-23 DIAGNOSIS — T881XXA Other complications following immunization, not elsewhere classified, initial encounter: Secondary | ICD-10-CM | POA: Diagnosis not present

## 2021-05-27 ENCOUNTER — Telehealth: Payer: Self-pay | Admitting: Hematology

## 2021-05-27 NOTE — Telephone Encounter (Signed)
Received a new pt referral from Dr. Shelia Media for splenic cyst and thrombocytopenia. Stanley Waters has been cld and scheduled to see Dr. Irene Limbo on 8/3 at 1:20pm.

## 2021-06-03 NOTE — Progress Notes (Signed)
HEMATOLOGY/ONCOLOGY CONSULTATION NOTE  Date of Service: 06/03/2021  Patient Care Team: Deland Pretty, MD as PCP - General (Internal Medicine) Lorretta Harp, MD as PCP - Cardiology (Cardiology) Thompson Grayer, MD as PCP - Electrophysiology (Cardiology) Barrett, Evelene Croon, PA-C as Physician Assistant (Cardiology)  CHIEF COMPLAINTS/PURPOSE OF CONSULTATION:  Splenic cyst and thrombocytopenia  HISTORY OF PRESENTING ILLNESS:   Stanley Waters is a wonderful 86 y.o. male who has been referred to [place] by Dr. Deland Pretty, MD for evaluation and management of splenic cyst and thrombocytopenia.  The pt has a history of atrial flutter,persistent atrial fibrillation, atypical chest pain, BPH, hypertension, dyslipidemia, chronic mild thrombocytopenia noted from 2011.  Patient had groin pain with a history of inguinal hernia repair and therefore had a CT of the abdomen and pelvis on 02/13/2021 which showed no acute intra-abdominal pathology but showed an incidental findings of Low-density structure involving the medial inferior spleen measures 3.0 cm. This has water attenuation. This splenic lesion was not present on the exam from 2008. This structure looks similar on the delayed images and this could represent a cyst. There is an additional hypodensity in the midportion of the spleen which was present in 2008. No perisplenic stranding or edema.  Lab results 03/03/2021 of CBC w/diff and CMP is as follows: all values are WNL except for Plt of 118K.  On review of systems, pt reports no fevers no chills no night sweats no unexpected weight loss no new fatigue.  No left upper quadrant abdominal pain.  No other acute new symptoms.. Notes he had COVID infection in jan 2020  Never had blood transfusions   MEDICAL HISTORY:  Past Medical History:  Diagnosis Date   Atrial flutter ([place])    Atypical chest pain    Myoview performed 07/23/11 was completely normal. Post stress EF 61%.   Bruit    Left  asymptomatic bruit. Carotid Duplex 12/13/12 =mildly abnormal. *BILATERAL BULB/PROXIMAL ICAs: Demonstrated a mild amount of fibrous plaque w/no evidence od significant diameter reduction, tortuosity or other vascular abnormality.   Enlarged prostate    BPH - PSA of 6.7 this is consistant with his last PSA of 6.3   Hyperlipemia    Hypertension    Inguinal hernia    Inguinal hernia repair (x) 2 1991, 2011   Intestinal polyps 09/22/11   Colonoscopy removed a 2 mm sessile cecal polyp with a cold snare.   Measles    Mumps    Normal coronary arteries 2002   Peripheral neuropathy    Persistent atrial fibrillation ([facility])    Seasonal allergies    Skin cancer    Thrombocytopenia ([place]) 2011   Very mild with platelets of 135,000.   Tinnitus    Umbilical hernia    Vitamin D deficiency     SURGICAL HISTORY: Past Surgical History:  Procedure Laterality Date   ATRIAL FIBRILLATION ABLATION  09/20/2018   ATRIAL FIBRILLATION ABLATION N/A 09/20/2018   Procedure: ATRIAL FIBRILLATION ABLATION;  Surgeon: Thompson Grayer, MD;  Location: [facility];  Service: Cardiovascular;  Laterality: N/A;   CARDIAC CATHETERIZATION  2002   "Normal cardiac catheterization"   CARDIOVERSION  07/07/2012   A fib - Cardioversion successful.   CARDIOVERSION N/A 06/30/2013   Procedure: CARDIOVERSION;  Surgeon: Sanda Klein, MD;  Location: [facility];  Service: Cardiovascular;  Laterality: N/A;   CARDIOVERSION N/A 08/02/2018   Procedure: CARDIOVERSION;  Surgeon: Lelon Perla, MD;  Location: [facility] ENDOSCOPY;  Service: Cardiovascular;  Laterality: N/A;  Carotid Duplex  12/13/12   For asymptomatic bruit. Mildly abnormal.  *BILATERAL BULB/PROXIMAL ICAs: Demonstrated a mild amount of fibrous plaque w/no evidence of significant diameter reduction, tortuosity or other vascular abnormality.    COLONOSCOPY W/ POLYPECTOMY  09/22/11   2 mm sessile cecal polyp was removed with a cold snare.   HAND SURGERY     INGUINAL HERNIA REPAIR   1991 & 2011   TEE WITHOUT CARDIOVERSION  07/07/2012   Procedure: TRANSESOPHAGEAL ECHOCARDIOGRAM (TEE);  Surgeon: Pixie Casino, MD;  Location: [facility] ENDOSCOPY;  Service: Cardiovascular;  Laterality: N/A;   TEE WITHOUT CARDIOVERSION N/A 08/02/2018   Procedure: TRANSESOPHAGEAL ECHOCARDIOGRAM (TEE);  Surgeon: Lelon Perla, MD;  Location: [facility] ENDOSCOPY;  Service: Cardiovascular;  Laterality: N/A;    SOCIAL HISTORY: Social History   Socioeconomic History   Marital status: Widowed    Spouse name: Not on file   Number of children: 2   Years of education: Not on file   Highest education level: Not on file  Occupational History    Employer: RETIRED    Comment: Road Architect  Tobacco Use   Smoking status: Former    Types: Cigarettes    Quit date: 11/03/1963    Years since quitting: 78.6   Smokeless tobacco: Never  Vaping Use   Vaping Use: Never used  Substance and Sexual Activity   Alcohol use: No   Drug use: No   Sexual activity: Not Currently  Other Topics Concern   Not on file  Social History Narrative   Lives in [place], alone   Retired from Landscape architect   Social Determinants of Radio broadcast assistant Strain: Not on file  Food Insecurity: Not on file  Transportation Needs: Not on file  Physical Activity: Not on file  Stress: Not on file  Social Connections: Not on file  Intimate Partner Violence: Not on file    FAMILY HISTORY: Family History  Problem Relation Age of Onset   Heart failure Brother     ALLERGIES:  is allergic to prednisone and doxycycline.  MEDICATIONS:  Current Outpatient Medications  Medication Sig Dispense Refill   acetaminophen (TYLENOL) 500 MG tablet Take 1,000 mg by mouth daily as needed for moderate pain or headache.     Cholecalciferol (VITAMIN D3) 2000 units TABS Take 2,000 Units by mouth daily.     Cyanocobalamin (B-12) 2500 MCG SUBL Place 1 tablet under the tongue daily.     doxazosin (CARDURA) 8 MG tablet Take 1 tablet  (8 mg total) by mouth at bedtime. 90 tablet 3   furosemide (LASIX) 20 MG tablet Take 20 mg by mouth daily as needed for fluid.      lisinopril (PRINIVIL,ZESTRIL) 20 MG tablet Take 20 mg by mouth daily as needed (if systolic bp is 0000000).      Multiple Vitamins-Minerals (ICAPS) CAPS Take 1 capsule by mouth daily.      nitroGLYCERIN (NITROSTAT) 0.4 MG SL tablet PLACE 1 TABLET UNDER THE TONGUE EVERY 5 MINUTES AS NEEDED FOR CHEST PAIN 25 tablet 1   Omega-3 Fatty Acids (FISH OIL) 1000 MG CAPS Take 1,000 mg by mouth at bedtime.     Polyethyl [name] (SYSTANE OP) Place 1 drop into both eyes daily as needed (dry eyes).     polyethylene glycol (MIRALAX / GLYCOLAX) packet Take 17 g by mouth daily as needed for moderate constipation.     rivaroxaban (XARELTO) 20 MG TABS tablet Take 1 tablet (20 mg total) by mouth daily  with supper. 90 tablet 2   rOPINIRole (REQUIP) 0.25 MG tablet Take 0.25 mg by mouth at bedtime.      simvastatin (ZOCOR) 5 MG tablet Take 5 mg by mouth daily.      triamcinolone cream (KENALOG) 0.1 % Apply 1 application topically 2 (two) times daily as needed (SKIN RASHES).      No current facility-administered medications for this visit.    REVIEW OF SYSTEMS:    10 Point review of Systems was done is negative except as noted above.  PHYSICAL EXAMINATION: ECOG PERFORMANCE STATUS: 1 - Symptomatic but completely ambulatory  . Vitals:   06/04/21 1304  BP: (!) 157/65  Pulse: 62  Resp: 17  Temp: 98.3 F (36.8 C)  SpO2: 97%   Filed Weights   06/04/21 1304  Weight: 201 lb 4.8 oz (91.3 kg)   .Body mass index is 27.3 kg/m.  NAD GENERAL:alert, in no acute distress and comfortable SKIN: no acute rashes, no significant lesions EYES: conjunctiva are pink and non-injected, sclera anicteric OROPHARYNX: MMM, no exudates, no oropharyngeal erythema or ulceration NECK: supple, no JVD LYMPH:  no palpable lymphadenopathy in the cervical, axillary or inguinal regions LUNGS:  clear to auscultation b/l with normal respiratory effort HEART: regular rate & rhythm ABDOMEN:  normoactive bowel sounds , non tender, not distended. Extremity: no pedal edema PSYCH: alert & oriented x 3 with fluent speech NEURO: no focal motor/sensory deficits  LABORATORY DATA:  I have reviewed the data as listed  . CBC Latest Ref Rng & Units 06/04/2021 08/24/2018 08/02/2018  WBC 4.0 - 10.5 K/uL 4.9 5.4 -  Hemoglobin 13.0 - 17.0 g/dL 15.2 15.1 13.6  Hematocrit 39.0 - 52.0 % 44.1 43.2 40.0  Platelets 150 - 400 K/uL 133(L) 161 -    . CMP Latest Ref Rng & Units 08/24/2018 08/02/2018 07/19/2018  Glucose 65 - 99 mg/dL 86 98 107(H)  BUN 8 - 27 mg/dL 17 - 15  Creatinine 0.76 - 1.27 mg/dL 1.14 - 1.13  Sodium 134 - 144 mmol/L 143 141 141  Potassium 3.5 - 5.2 mmol/L 3.6 3.6 3.6  Chloride 96 - 106 mmol/L 104 - 106  CO2 20 - 29 mmol/L 24 - 24  Calcium 8.6 - 10.2 mg/dL 9.2 - 8.9  Total Protein 6.0 - 8.5 g/dL - - -  Total Bilirubin 0.0 - 1.2 mg/dL - - -  Alkaline Phos 39 - 117 IU/L - - -  AST 0 - 40 IU/L - - -  ALT 0 - 44 IU/L - - -     RADIOGRAPHIC STUDIES: I have personally reviewed the radiological images as listed and agreed with the findings in the report. No results found.  ASSESSMENT & PLAN:   86 year old patient with multiple medical comorbidities with  #1 Chronic mild thrombocytopenia from at least 2011. Recent platelet with primary care physician or down to 118k On repeat labs today depressions platelets have improved to 133k Chronic nonprogressive thrombocytopenia suggests this could be due to one of his chronic medications. Unlikely to be related to his incidentally noted splenic cysts. PLAN: -Discussed her lab results and CT imaging findings in details. -Repeat labs done today show CBC with a very mild thrombocytopenia of 133k this would represent isolated thrombocytopenia of undetermined significance. -LDH within normal limits at 147 -Hepatitis C testing  negative. -No additional work-up recommended for his mild chronic thrombocytopenia at this time. -Continue monitoring labs every 6 months with primary care physician and consult [place] if platelets less than  75k -Watch for use of medications that could worsen thrombocytopenia.  #2 incidentally noted low-density splenic lesions consistent with splenic cysts.  One of the cysts was noted in 2008 and the other 1 is apparently new since 2008.  No inflammation or edema around the lesion to suggest splenic abscesses. No fevers chills or night sweats to suggest an acute infectious process or lymphoproliferative disorder. No overt splenomegaly. Given the patient's history of A. fib cannot rule out small old microinfarcts. Currently patient is having no pain and no symptoms associated with these findings. Plan -We will recommend repeat CT in 6 months with primary care physician to evaluate for any new changes unless there are new symptoms prior to this. -If CT scan in 6 months is stable would recommend getting another one 1 year after that.  If that is stable could probably stop scanning. -Please reconsult [place] if any significant changes in blood counts or changes noted on CT imaging.   -Will see back as needed  . Orders Placed This Encounter  Procedures   CBC with Differential/Platelet    Standing Status:   Future    Number of Occurrences:   1    Standing Expiration Date:   06/04/2022   Immature Platelet Fraction    Standing Status:   Future    Number of Occurrences:   1    Standing Expiration Date:   06/04/2022   Lactate dehydrogenase    Standing Status:   Future    Number of Occurrences:   1    Standing Expiration Date:   06/04/2022   Hepatitis C antibody    Standing Status:   Future    Number of Occurrences:   1    Standing Expiration Date:   06/04/2022     FOLLOW UP: Labs today Phone visit in 1 week with Dr Irene Limbo   All of the patients questions were answered with apparent satisfaction. The  patient knows to call the clinic with any problems, questions or concerns.  I spent 35 minutes counseling the patient face to face. The total time spent in the appointment was 50 minutes and more than 50% was on counseling and direct patient cares.    Sullivan Lone MD [place] [facility] Hematology/Oncology Physician [facility]  (Office):       [phone] (Work cell):  [phone] (Fax):           [phone]  06/03/2021 8:12 PM  I, Reinaldo Raddle, am acting as scribe for Dr. Sullivan Lone, MD.   .I have reviewed the above documentation for accuracy and completeness, and I agree with the above. Brunetta Genera MD

## 2021-06-04 ENCOUNTER — Other Ambulatory Visit: Payer: Self-pay

## 2021-06-04 ENCOUNTER — Inpatient Hospital Stay: Payer: Medicare HMO

## 2021-06-04 ENCOUNTER — Inpatient Hospital Stay: Payer: Medicare HMO | Attending: Hematology | Admitting: Hematology

## 2021-06-04 VITALS — BP 157/65 | HR 62 | Temp 98.3°F | Resp 17 | Ht 72.0 in | Wt 201.3 lb

## 2021-06-04 DIAGNOSIS — I4891 Unspecified atrial fibrillation: Secondary | ICD-10-CM | POA: Diagnosis not present

## 2021-06-04 DIAGNOSIS — Z87891 Personal history of nicotine dependence: Secondary | ICD-10-CM | POA: Insufficient documentation

## 2021-06-04 DIAGNOSIS — I10 Essential (primary) hypertension: Secondary | ICD-10-CM | POA: Diagnosis not present

## 2021-06-04 DIAGNOSIS — D696 Thrombocytopenia, unspecified: Secondary | ICD-10-CM

## 2021-06-04 DIAGNOSIS — D734 Cyst of spleen: Secondary | ICD-10-CM | POA: Insufficient documentation

## 2021-06-04 LAB — CBC WITH DIFFERENTIAL/PLATELET
Abs Immature Granulocytes: 0.03 10*3/uL (ref 0.00–0.07)
Basophils Absolute: 0 10*3/uL (ref 0.0–0.1)
Basophils Relative: 0 %
Eosinophils Absolute: 0.1 10*3/uL (ref 0.0–0.5)
Eosinophils Relative: 2 %
HCT: 44.1 % (ref 39.0–52.0)
Hemoglobin: 15.2 g/dL (ref 13.0–17.0)
Immature Granulocytes: 1 %
Lymphocytes Relative: 16 %
Lymphs Abs: 0.8 10*3/uL (ref 0.7–4.0)
MCH: 31.6 pg (ref 26.0–34.0)
MCHC: 34.5 g/dL (ref 30.0–36.0)
MCV: 91.7 fL (ref 80.0–100.0)
Monocytes Absolute: 0.4 10*3/uL (ref 0.1–1.0)
Monocytes Relative: 8 %
Neutro Abs: 3.6 10*3/uL (ref 1.7–7.7)
Neutrophils Relative %: 73 %
Platelets: 133 10*3/uL — ABNORMAL LOW (ref 150–400)
RBC: 4.81 MIL/uL (ref 4.22–5.81)
RDW: 12.6 % (ref 11.5–15.5)
WBC: 4.9 10*3/uL (ref 4.0–10.5)
nRBC: 0 % (ref 0.0–0.2)

## 2021-06-04 LAB — HEPATITIS C ANTIBODY: HCV Ab: NONREACTIVE

## 2021-06-04 LAB — LACTATE DEHYDROGENASE: LDH: 147 U/L (ref 98–192)

## 2021-06-04 LAB — IMMATURE PLATELET FRACTION: Immature Platelet Fraction: 4 % (ref 1.2–8.6)

## 2021-06-10 ENCOUNTER — Telehealth: Payer: Self-pay | Admitting: Hematology

## 2021-06-10 NOTE — Telephone Encounter (Signed)
Rescheduled upcoming appointment due to provider's emergency. Patient is aware of changes. 

## 2021-06-11 ENCOUNTER — Ambulatory Visit: Payer: Medicare HMO | Admitting: Hematology

## 2021-06-16 DIAGNOSIS — M810 Age-related osteoporosis without current pathological fracture: Secondary | ICD-10-CM | POA: Diagnosis not present

## 2021-06-16 DIAGNOSIS — I10 Essential (primary) hypertension: Secondary | ICD-10-CM | POA: Diagnosis not present

## 2021-06-16 DIAGNOSIS — G4700 Insomnia, unspecified: Secondary | ICD-10-CM | POA: Diagnosis not present

## 2021-06-20 ENCOUNTER — Telehealth: Payer: Self-pay

## 2021-06-20 NOTE — Telephone Encounter (Signed)
Contacted pt per Dr Irene Limbo:  let patient know -Repeat labs done today show CBC with a very mild thrombocytopenia of 133k  -LDH within normal limits at 147  -Hepatitis C testing negative.  -No additional work-up recommended for his mild chronic thrombocytopenia at this time.  -Continue monitoring labs every 6 months with primary care physician and consult [place] if platelets less than 75k  -Watch for use of medications that could worsen thrombocytopenia.  F/u with PCP to -We will recommend repeat CT in 6 months with primary care physician to evaluate for any new changes unless there are new symptoms prior to this.  -If CT scan in 6 months is stable would recommend getting another one 1 year after that.  If that is stable could probably stop scanning.  -Please reconsult [place] if any significant changes in blood counts or changes noted on CT imaging.  Pt had some questions about above, clarified thrombocytopenia for pt and also verified that pt has a phone appointment with  Dr Irene Limbo next week.

## 2021-06-25 ENCOUNTER — Inpatient Hospital Stay (HOSPITAL_BASED_OUTPATIENT_CLINIC_OR_DEPARTMENT_OTHER): Payer: Medicare HMO | Admitting: Hematology

## 2021-06-25 DIAGNOSIS — I4891 Unspecified atrial fibrillation: Secondary | ICD-10-CM | POA: Diagnosis not present

## 2021-06-25 DIAGNOSIS — I10 Essential (primary) hypertension: Secondary | ICD-10-CM | POA: Diagnosis not present

## 2021-06-25 DIAGNOSIS — D696 Thrombocytopenia, unspecified: Secondary | ICD-10-CM

## 2021-06-25 DIAGNOSIS — Z87891 Personal history of nicotine dependence: Secondary | ICD-10-CM | POA: Diagnosis not present

## 2021-06-25 DIAGNOSIS — D734 Cyst of spleen: Secondary | ICD-10-CM | POA: Diagnosis not present

## 2021-06-25 NOTE — Progress Notes (Signed)
HEMATOLOGY/ONCOLOGY CLINIC NOTE  Date of Service: 06/25/2021  Patient Care Team: Deland Pretty, MD as PCP - General (Internal Medicine) Lorretta Harp, MD as PCP - Cardiology (Cardiology) Thompson Grayer, MD as PCP - Electrophysiology (Cardiology) Barrett, Evelene Croon, PA-C as Physician Assistant (Cardiology)  CHIEF COMPLAINTS/PURPOSE OF CONSULTATION:  Splenic cyst and thrombocytopenia  HISTORY OF PRESENTING ILLNESS:   Stanley Waters is a wonderful 86 y.o. male who has been referred to [place] by Dr. Deland Pretty, MD for evaluation and management of splenic cyst and thrombocytopenia.  The pt has a history of atrial flutter,persistent atrial fibrillation, atypical chest pain, BPH, hypertension, dyslipidemia, chronic mild thrombocytopenia noted from 2011.  Patient had groin pain with a history of inguinal hernia repair and therefore had a CT of the abdomen and pelvis on 02/13/2021 which showed no acute intra-abdominal pathology but showed an incidental findings of Low-density structure involving the medial inferior spleen measures 3.0 cm. This has water attenuation. This splenic lesion was not present on the exam from 2008. This structure looks similar on the delayed images and this could represent a cyst. There is an additional hypodensity in the midportion of the spleen which was present in 2008. No perisplenic stranding or edema.  Lab results 03/03/2021 of CBC w/diff and CMP is as follows: all values are WNL except for Plt of 118K.  On review of systems, pt reports no fevers no chills no night sweats no unexpected weight loss no new fatigue.  No left upper quadrant abdominal pain.  No other acute new symptoms.. Notes he had COVID infection in jan 2020  Never had blood transfusions  INTERVAL HISTORY  I called Stanley Waters for his phone visit to discuss his lab results.  He notes no acute new concerns.  No issues with bleeding.  No abdominal pain or distention.  His platelets are improved to  133k and the rest of the CBC is unremarkable.  We discussed the presence of chronic fluctuating nonprogressive thrombocytopenia of undetermined significance. Splenic cysts were noted incidentally and are asymptomatic at this time.  One of them was present in 2008 and the other one has occurred since then.  Patient has no symptoms related to this.  The CT did not show any overt inflammation or edema around the lesions to suggest an acute infectious or inflammatory process or tumor or infarct.  MEDICAL HISTORY:  Past Medical History:  Diagnosis Date   Atrial flutter ([place])    Atypical chest pain    Myoview performed 07/23/11 was completely normal. Post stress EF 61%.   Bruit    Left asymptomatic bruit. Carotid Duplex 12/13/12 =mildly abnormal. *BILATERAL BULB/PROXIMAL ICAs: Demonstrated a mild amount of fibrous plaque w/no evidence od significant diameter reduction, tortuosity or other vascular abnormality.   Enlarged prostate    BPH - PSA of 6.7 this is consistant with his last PSA of 6.3   Hyperlipemia    Hypertension    Inguinal hernia    Inguinal hernia repair (x) 2 1991, 2011   Intestinal polyps 09/22/11   Colonoscopy removed a 2 mm sessile cecal polyp with a cold snare.   Measles    Mumps    Normal coronary arteries 2002   Peripheral neuropathy    Persistent atrial fibrillation (HCC)    Seasonal allergies    Skin cancer    Thrombocytopenia ([place]) 2011   Very mild with platelets of 135,000.   Tinnitus    Umbilical hernia    Vitamin D deficiency  SURGICAL HISTORY: Past Surgical History:  Procedure Laterality Date   ATRIAL FIBRILLATION ABLATION  09/20/2018   ATRIAL FIBRILLATION ABLATION N/A 09/20/2018   Procedure: ATRIAL FIBRILLATION ABLATION;  Surgeon: Thompson Grayer, MD;  Location: [place] [facility];  Service: Cardiovascular;  Laterality: N/A;   CARDIAC CATHETERIZATION  2002   "Normal cardiac catheterization"   CARDIOVERSION  07/07/2012   A fib - Cardioversion successful.    CARDIOVERSION N/A 06/30/2013   Procedure: CARDIOVERSION;  Surgeon: Sanda Klein, MD;  Location: [place];  Service: Cardiovascular;  Laterality: N/A;   CARDIOVERSION N/A 08/02/2018   Procedure: CARDIOVERSION;  Surgeon: Lelon Perla, MD;  Location: [facility] ENDOSCOPY;  Service: Cardiovascular;  Laterality: N/A;   Carotid Duplex  12/13/12   For asymptomatic bruit. Mildly abnormal.  *BILATERAL BULB/PROXIMAL ICAs: Demonstrated a mild amount of fibrous plaque w/no evidence of significant diameter reduction, tortuosity or other vascular abnormality.    COLONOSCOPY W/ POLYPECTOMY  09/22/11   2 mm sessile cecal polyp was removed with a cold snare.   HAND SURGERY     INGUINAL HERNIA REPAIR  1991 & 2011   TEE WITHOUT CARDIOVERSION  07/07/2012   Procedure: TRANSESOPHAGEAL ECHOCARDIOGRAM (TEE);  Surgeon: Pixie Casino, MD;  Location[facility] ENDOSCOPY;  Service: Cardiovascular;  Laterality: N/A;   TEE WITHOUT CARDIOVERSION N/A 08/02/2018   Procedure: TRANSESOPHAGEAL ECHOCARDIOGRAM (TEE);  Surgeon: Lelon Perla, MD;  Location: [facility] ENDOSCOPY;  Service: Cardiovascular;  Laterality: N/A;    SOCIAL HISTORY: Social History   Socioeconomic History   Marital status: Widowed    Spouse name: Not on file   Number of children: 2   Years of education: Not on file   Highest education level: Not on file  Occupational History    Employer: RETIRED    Comment: Road Architect  Tobacco Use   Smoking status: Former    Types: Cigarettes    Quit date: 11/03/1963    Years since quitting: 89.6   Smokeless tobacco: Never  Vaping Use   Vaping Use: Never used  Substance and Sexual Activity   Alcohol use: No   Drug use: No   Sexual activity: Not Currently  Other Topics Concern   Not on file  Social History Narrative   Lives in [place], alone   Retired from Landscape architect   Social Determinants of Radio broadcast assistant Strain: Not on file  Food Insecurity: Not on file  Transportation Needs: Not on file   Physical Activity: Not on file  Stress: Not on file  Social Connections: Not on file  Intimate Partner Violence: Not on file    FAMILY HISTORY: Family History  Problem Relation Age of Onset   Heart failure Brother     ALLERGIES:  is allergic to prednisone and doxycycline.  MEDICATIONS:  Current Outpatient Medications  Medication Sig Dispense Refill   acetaminophen (TYLENOL) 500 MG tablet Take 1,000 mg by mouth daily as needed for moderate pain or headache.     Cholecalciferol (VITAMIN D3) 2000 units TABS Take 2,000 Units by mouth daily.     Cyanocobalamin (B-12) 2500 MCG SUBL Place 1 tablet under the tongue daily.     doxazosin (CARDURA) 8 MG tablet Take 1 tablet (8 mg total) by mouth at bedtime. 90 tablet 3   furosemide (LASIX) 20 MG tablet Take 20 mg by mouth daily as needed for fluid.      lisinopril (PRINIVIL,ZESTRIL) 20 MG tablet Take 20 mg by mouth daily as needed (if systolic bp is 0000000).  Multiple Vitamins-Minerals (ICAPS) CAPS Take 1 capsule by mouth daily.      nitroGLYCERIN (NITROSTAT) 0.4 MG SL tablet PLACE 1 TABLET UNDER THE TONGUE EVERY 5 MINUTES AS NEEDED FOR CHEST PAIN 25 tablet 1   Omega-3 Fatty Acids (FISH OIL) 1000 MG CAPS Take 1,000 mg by mouth at bedtime.     Polyethyl [name] (SYSTANE OP) Place 1 drop into both eyes daily as needed (dry eyes).     polyethylene glycol (MIRALAX / GLYCOLAX) packet Take 17 g by mouth daily as needed for moderate constipation.     rivaroxaban (XARELTO) 20 MG TABS tablet Take 1 tablet (20 mg total) by mouth daily with supper. 90 tablet 2   rOPINIRole (REQUIP) 0.25 MG tablet Take 0.25 mg by mouth at bedtime.      simvastatin (ZOCOR) 5 MG tablet Take 5 mg by mouth daily.      triamcinolone cream (KENALOG) 0.1 % Apply 1 application topically 2 (two) times daily as needed (SKIN RASHES).      No current facility-administered medications for this visit.    REVIEW OF SYSTEMS:    10 Point review of Systems was done is  negative except as noted above.  PHYSICAL EXAMINATION: ECOG PERFORMANCE STATUS: 1 - Symptomatic but completely ambulatory  . Phone visit  LABORATORY DATA:  I have reviewed the data as listed  . CBC Latest Ref Rng & Units 06/04/2021 08/24/2018 08/02/2018  WBC 4.0 - 10.5 K/uL 4.9 5.4 -  Hemoglobin 13.0 - 17.0 g/dL 15.2 15.1 13.6  Hematocrit 39.0 - 52.0 % 44.1 43.2 40.0  Platelets 150 - 400 K/uL 133(L) 161 -    . CMP Latest Ref Rng & Units 08/24/2018 08/02/2018 07/19/2018  Glucose 65 - 99 mg/dL 86 98 107(H)  BUN 8 - 27 mg/dL 17 - 15  Creatinine 0.76 - 1.27 mg/dL 1.14 - 1.13  Sodium 134 - 144 mmol/L 143 141 141  Potassium 3.5 - 5.2 mmol/L 3.6 3.6 3.6  Chloride 96 - 106 mmol/L 104 - 106  CO2 20 - 29 mmol/L 24 - 24  Calcium 8.6 - 10.2 mg/dL 9.2 - 8.9  Total Protein 6.0 - 8.5 g/dL - - -  Total Bilirubin 0.0 - 1.2 mg/dL - - -  Alkaline Phos 39 - 117 IU/L - - -  AST 0 - 40 IU/L - - -  ALT 0 - 44 IU/L - - -     RADIOGRAPHIC STUDIES: I have personally reviewed the radiological images as listed and agreed with the findings in the report. No results found.  ASSESSMENT & PLAN:   86 year old patient with multiple medical comorbidities with  #1 Chronic mild thrombocytopenia from at least 2011. Recent platelet with primary care physician or down to 118k On repeat labs today depressions platelets have improved to 133k Chronic nonprogressive thrombocytopenia suggests this could be due to one of his chronic medications. Unlikely to be related to his incidentally noted splenic cysts. PLAN: -Discussed his lab results and CT imaging findings in details. -Repeat labs done today show CBC with a very mild thrombocytopenia of 133k this would represent isolated thrombocytopenia of undetermined significance. -LDH within normal limits at 147 -Hepatitis C testing negative. -No additional work-up recommended for his mild chronic thrombocytopenia at this time. -Continue monitoring labs every 6  months with primary care physician and consult [place] if platelets less than 75k -Watch for use of medications that could worsen thrombocytopenia.  #2 incidentally noted low-density splenic lesions consistent with splenic cysts.  One of the cysts was noted in 2008 and the other 1 is apparently new since 2008.  No inflammation or edema around the lesion to suggest splenic abscesses. No fevers chills or night sweats to suggest an acute infectious process or lymphoproliferative disorder. No overt splenomegaly. Given the patient's history of A. fib cannot rule out small old microinfarcts. Currently patient is having no pain and no symptoms associated with these findings. Plan -We will recommend repeat CT in 6 months with primary care physician to evaluate for any new changes unless there are new symptoms prior to this. -If CT scan in 6 months is stable would recommend getting another one 1 year after that.  If that is stable could probably stop scanning. -Please reconsult [place] if any significant changes in blood counts or changes noted on CT imaging.   -Will see back as needed  FOLLOW UP: Follow-up with Dr. Shelia Media. Return to clinic with Dr. Irene Limbo as needed  All of the patients questions were answered with apparent satisfaction. The patient knows to call the clinic with any problems, questions or concerns.   Sullivan Lone MD [place] AAHIVMS [facility] Hematology/Oncology Physician [facility]  (Office):       [phone] (Work cell):  [phone] (Fax):           [phone]

## 2021-07-02 DIAGNOSIS — I10 Essential (primary) hypertension: Secondary | ICD-10-CM | POA: Diagnosis not present

## 2021-07-02 DIAGNOSIS — G2581 Restless legs syndrome: Secondary | ICD-10-CM | POA: Diagnosis not present

## 2021-07-02 DIAGNOSIS — E785 Hyperlipidemia, unspecified: Secondary | ICD-10-CM | POA: Diagnosis not present

## 2021-07-08 DIAGNOSIS — G4700 Insomnia, unspecified: Secondary | ICD-10-CM | POA: Diagnosis not present

## 2021-07-08 DIAGNOSIS — I10 Essential (primary) hypertension: Secondary | ICD-10-CM | POA: Diagnosis not present

## 2021-07-18 DIAGNOSIS — H524 Presbyopia: Secondary | ICD-10-CM | POA: Diagnosis not present

## 2021-07-25 DIAGNOSIS — R55 Syncope and collapse: Secondary | ICD-10-CM | POA: Diagnosis not present

## 2021-08-05 DIAGNOSIS — R42 Dizziness and giddiness: Secondary | ICD-10-CM | POA: Diagnosis not present

## 2021-08-05 DIAGNOSIS — I4891 Unspecified atrial fibrillation: Secondary | ICD-10-CM | POA: Diagnosis not present

## 2021-08-05 DIAGNOSIS — I951 Orthostatic hypotension: Secondary | ICD-10-CM | POA: Diagnosis not present

## 2021-08-05 DIAGNOSIS — R55 Syncope and collapse: Secondary | ICD-10-CM | POA: Diagnosis not present

## 2021-08-06 ENCOUNTER — Telehealth: Payer: Self-pay | Admitting: Cardiovascular Disease

## 2021-08-06 NOTE — Telephone Encounter (Signed)
Spoke to patient he stated he has been feeling tired,dizzy,heart out of rhythm for the past couple of weeks.No chest pain.He has noticed only 1 time heart rate over 100.Appointment scheduled with Jory Sims DNP 10/14 at 2:45 pm.Advised if he worsens go to ED.

## 2021-08-06 NOTE — Telephone Encounter (Signed)
Patient c/o Palpitations:  High priority if patient c/o lightheadedness, shortness of breath, or chest pain  How long have you had palpitations/irregular HR/ Afib? Are you having the symptoms now?  Patient states his heart monitor notified him that his heart was out of rhythm yesterday and this morning when he woke up it was still out of rhythm  Are you currently experiencing lightheadedness, SOB or CP?   No   Do you have a history of afib (atrial fibrillation) or irregular heart rhythm?  Yes   Have you checked your BP or HR? (document readings if available):  10/05: 156/90  109/61  Are you experiencing any other symptoms?  No

## 2021-08-07 ENCOUNTER — Telehealth: Payer: Self-pay | Admitting: Internal Medicine

## 2021-08-07 NOTE — Telephone Encounter (Signed)
New message   Pt is requesting call from RN to discuss his heart rhythm.

## 2021-08-07 NOTE — Telephone Encounter (Signed)
BP 160/89 HR 69,  BP 158/81 HR 81 the patients PCP noted heart was out of rhythm on Tuesday. Having weakness. No missed doses of Xarelto. Appointment set for Monday (10/6) with Oda Kilts APP. ED precautions given.

## 2021-08-11 ENCOUNTER — Ambulatory Visit: Payer: Medicare HMO | Admitting: Student

## 2021-08-11 ENCOUNTER — Encounter: Payer: Self-pay | Admitting: Student

## 2021-08-11 ENCOUNTER — Other Ambulatory Visit: Payer: Self-pay

## 2021-08-11 VITALS — BP 150/90 | HR 72 | Ht 72.0 in | Wt 196.0 lb

## 2021-08-11 DIAGNOSIS — I480 Paroxysmal atrial fibrillation: Secondary | ICD-10-CM | POA: Diagnosis not present

## 2021-08-11 DIAGNOSIS — E782 Mixed hyperlipidemia: Secondary | ICD-10-CM

## 2021-08-11 DIAGNOSIS — Z7901 Long term (current) use of anticoagulants: Secondary | ICD-10-CM

## 2021-08-11 DIAGNOSIS — I10 Essential (primary) hypertension: Secondary | ICD-10-CM

## 2021-08-11 NOTE — Patient Instructions (Signed)
Medication Instructions:  Your physician recommends that you continue on your current medications as directed. Please refer to the Current Medication list given to you today.  *If you need a refill on your cardiac medications before your next appointment, please call your pharmacy*   Lab Work: TODAY: BMET, CBC  If you have labs (blood work) drawn today and your tests are completely normal, you will receive your results only by: [place] (if you have MyChart) OR A paper copy in the mail If you have any lab test that is abnormal or we need to change your treatment, we will call you to review the results.   Follow-Up: At [facility], you and your health needs are our priority.  As part of our continuing mission to provide you with exceptional heart care, we have created designated Provider Care Teams.  These Care Teams include your primary Cardiologist (physician) and Advanced Practice Providers (APPs -  Physician Assistants and Nurse Practitioners) who all work together to provide you with the care you need, when you need it.  We recommend signing up for the patient portal called "MyChart".  Sign up information is provided on this After Visit Summary.  MyChart is used to connect with patients for Virtual Visits (Telemedicine).  Patients are able to view lab/test results, encounter notes, upcoming appointments, etc.  Non-urgent messages can be sent to your provider as well.   To learn more about what you can do with MyChart, go to [email or web address].    Your next appointment:   6 month(s)  The format for your next appointment:   In Person  Provider:   You may see Thompson Grayer, MD or one of the following Advanced Practice Providers on your designated Care Team:   Tommye Standard, [place] [facility]" [place], [place]

## 2021-08-11 NOTE — Progress Notes (Signed)
PCP:  Deland Pretty, MD Primary Cardiologist: Quay Burow, MD Electrophysiologist: Thompson Grayer, MD   Stanley Waters is a 86 y.o. male seen today for Thompson Grayer, MD for acute visit due to irregular heart eat .  Since last being seen in our clinic the patient reports doing OK overall. He had a near syncopal episode about a week ago and says EMS came out and told him he was dehydrated. BP 80-90s at that time. Meds were adjusted and he hasn't felt "quite right" since then. He cannot explain to me how or why he doesn't feel well. He is able to weed eat and load a wood splitter without SOB. He has no SOB with ADLs. He denies CP or syncope. He denies overt fatigue, just "doesn't feel well" for approximately a week. He was told his HR was irregular at PCP (no EKG was done) and it has had him worried.    Past Medical History:  Diagnosis Date   Atrial flutter ([place])    Atypical chest pain    Myoview performed 07/23/11 was completely normal. Post stress EF 61%.   Bruit    Left asymptomatic bruit. Carotid Duplex 12/13/12 =mildly abnormal. *BILATERAL BULB/PROXIMAL ICAs: Demonstrated a mild amount of fibrous plaque w/no evidence od significant diameter reduction, tortuosity or other vascular abnormality.   Enlarged prostate    BPH - PSA of 6.7 this is consistant with his last PSA of 6.3   Hyperlipemia    Hypertension    Inguinal hernia    Inguinal hernia repair (x) 2 1991, 2011   Intestinal polyps 09/22/11   Colonoscopy removed a 2 mm sessile cecal polyp with a cold snare.   Measles    Mumps    Normal coronary arteries 2002   Peripheral neuropathy    Persistent atrial fibrillation (HCC)    Seasonal allergies    Skin cancer    Thrombocytopenia ([place]) 2011   Very mild with platelets of 135,000.   Tinnitus    Umbilical hernia    Vitamin D deficiency    Past Surgical History:  Procedure Laterality Date   ATRIAL FIBRILLATION ABLATION  09/20/2018   ATRIAL FIBRILLATION ABLATION N/A 09/20/2018    Procedure: ATRIAL FIBRILLATION ABLATION;  Surgeon: Thompson Grayer, MD;  Location: [place] [facility];  Service: Cardiovascular;  Laterality: N/A;   CARDIAC CATHETERIZATION  2002   "Normal cardiac catheterization"   CARDIOVERSION  07/07/2012   A fib - Cardioversion successful.   CARDIOVERSION N/A 06/30/2013   Procedure: CARDIOVERSION;  Surgeon: Sanda Klein, MD;  Location: [place];  Service: Cardiovascular;  Laterality: N/A;   CARDIOVERSION N/A 08/02/2018   Procedure: CARDIOVERSION;  Surgeon: Lelon Perla, MD;  Location: [facility] ENDOSCOPY;  Service: Cardiovascular;  Laterality: N/A;   Carotid Duplex  12/13/12   For asymptomatic bruit. Mildly abnormal.  *BILATERAL BULB/PROXIMAL ICAs: Demonstrated a mild amount of fibrous plaque w/no evidence of significant diameter reduction, tortuosity or other vascular abnormality.    COLONOSCOPY W/ POLYPECTOMY  09/22/11   2 mm sessile cecal polyp was removed with a cold snare.   HAND SURGERY     INGUINAL HERNIA REPAIR  1991 & 2011   TEE WITHOUT CARDIOVERSION  07/07/2012   Procedure: TRANSESOPHAGEAL ECHOCARDIOGRAM (TEE);  Surgeon: Pixie Casino, MD;  Location: [facility] ENDOSCOPY;  Service: Cardiovascular;  Laterality: N/A;   TEE WITHOUT CARDIOVERSION N/A 08/02/2018   Procedure: TRANSESOPHAGEAL ECHOCARDIOGRAM (TEE);  Surgeon: Lelon Perla, MD;  Location: [place];  Service: Cardiovascular;  Laterality: N/A;  Current Outpatient Medications  Medication Sig Dispense Refill   acetaminophen (TYLENOL) 500 MG tablet Take 1,000 mg by mouth daily as needed for moderate pain or headache.     Cholecalciferol (VITAMIN D3) 2000 units TABS Take 2,000 Units by mouth daily.     Cyanocobalamin (B-12) 2500 MCG SUBL Place 1 tablet under the tongue daily.     doxazosin (CARDURA) 8 MG tablet Take 1 tablet (8 mg total) by mouth at bedtime. 90 tablet 3   furosemide (LASIX) 20 MG tablet Take 20 mg by mouth daily as needed for fluid.      lisinopril (PRINIVIL,ZESTRIL) 20 MG  tablet Take 20 mg by mouth daily as needed (if systolic bp is 295).      Multiple Vitamins-Minerals (ICAPS) CAPS Take 1 capsule by mouth daily.      nitroGLYCERIN (NITROSTAT) 0.4 MG SL tablet PLACE 1 TABLET UNDER THE TONGUE EVERY 5 MINUTES AS NEEDED FOR CHEST PAIN 25 tablet 1   Omega-3 Fatty Acids (FISH OIL) 1000 MG CAPS Take 1,000 mg by mouth at bedtime.     Polyethyl [name] (SYSTANE OP) Place 1 drop into both eyes daily as needed (dry eyes).     polyethylene glycol (MIRALAX / GLYCOLAX) packet Take 17 g by mouth daily as needed for moderate constipation.     rivaroxaban (XARELTO) 20 MG TABS tablet Take 1 tablet (20 mg total) by mouth daily with supper. 90 tablet 2   rOPINIRole (REQUIP) 0.25 MG tablet Take 0.25 mg by mouth at bedtime.      simvastatin (ZOCOR) 5 MG tablet Take 5 mg by mouth daily.      triamcinolone cream (KENALOG) 0.1 % Apply 1 application topically 2 (two) times daily as needed (SKIN RASHES).      No current facility-administered medications for this visit.    Allergies  Allergen Reactions   Prednisone Swelling, Other (See Comments) and Hypertension    Tachycardia   Doxycycline Rash    Social History   Socioeconomic History   Marital status: Widowed    Spouse name: Not on file   Number of children: 2   Years of education: Not on file   Highest education level: Not on file  Occupational History    Employer: RETIRED    Comment: Road Architect  Tobacco Use   Smoking status: Former    Types: Cigarettes    Quit date: 11/03/1963    Years since quitting: 57.8   Smokeless tobacco: Never  Vaping Use   Vaping Use: Never used  Substance and Sexual Activity   Alcohol use: No   Drug use: No   Sexual activity: Not Currently  Other Topics Concern   Not on file  Social History Narrative   Lives in [place], alone   Retired from Landscape architect   Social Determinants of Radio broadcast assistant Strain: Not on file  Food Insecurity: Not on  file  Transportation Needs: Not on file  Physical Activity: Not on file  Stress: Not on file  Social Connections: Not on file  Intimate Partner Violence: Not on file     Review of Systems: All other systems reviewed and are otherwise negative except as noted above.  Physical Exam: Vitals:   08/11/21 1209  BP: (!) 150/90  Pulse: 72  SpO2: 98%  Weight: 196 lb (88.9 kg)  Height: 6' (1.829 m)    GEN- The patient is well appearing, alert and oriented x 3 today.   HEENT: normocephalic, atraumatic; sclera  clear, conjunctiva pink; hearing intact; oropharynx clear; neck supple, no JVP Lymph- no cervical lymphadenopathy Lungs- Clear to ausculation bilaterally, normal work of breathing.  No wheezes, rales, rhonchi Heart- Regular rate and rhythm, no murmurs, rubs or gallops, PMI not laterally displaced GI- soft, non-tender, non-distended, bowel sounds present, no hepatosplenomegaly Extremities- no clubbing, cyanosis, or edema; DP/PT/radial pulses 2+ bilaterally MS- no significant deformity or atrophy Skin- warm and dry, no rash or lesion Psych- euthymic mood, full affect Neuro- strength and sensation are intact  EKG is ordered. Personal review of EKG from today shows NSR and PAC at 72 bpm  Additional studies reviewed include: Previous EP office notes.   Assessment and Plan:  1. Persistent atrial fibrillation mitral annual flutter S/p ablation 09/2018 Continue Xarelto for CHA2DS2-VASc of at least 3. EKG today shows PACs, which are consistent with his monitor last year Was previously on amiodarone prior to ablation Declines Echo or re-do monitor.  Re-assurance given. NYHA I symptoms. He weed eats and loads wood without SOB. Unlikely his non specific symptoms are from ectopy. Offered update echo but he declines.   2. HTN Labile. [facility] is actively titrating medications, so I will not complicate.  Encouraged him to call them back. He states he started on lisinopril 2.5  mg daily last week and hasn't noticed a difference.   3. Malaise His complaints today are non-specific. He has no SOB, CP, or palpitations.  Will draw BMET and CBC and defer further work up to PCP.   Shirley Friar, PA-C  08/11/21 12:20 PM

## 2021-08-12 LAB — CBC
Hematocrit: 44.5 % (ref 37.5–51.0)
Hemoglobin: 15.4 g/dL (ref 13.0–17.7)
MCH: 31 pg (ref 26.6–33.0)
MCHC: 34.6 g/dL (ref 31.5–35.7)
MCV: 90 fL (ref 79–97)
Platelets: 114 10*3/uL — ABNORMAL LOW (ref 150–450)
RBC: 4.97 x10E6/uL (ref 4.14–5.80)
RDW: 12 % (ref 11.6–15.4)
WBC: 4.7 10*3/uL (ref 3.4–10.8)

## 2021-08-12 LAB — BASIC METABOLIC PANEL
BUN/Creatinine Ratio: 13 (ref 10–24)
BUN: 14 mg/dL (ref 8–27)
CO2: 23 mmol/L (ref 20–29)
Calcium: 9.8 mg/dL (ref 8.6–10.2)
Chloride: 103 mmol/L (ref 96–106)
Creatinine, Ser: 1.12 mg/dL (ref 0.76–1.27)
Glucose: 85 mg/dL (ref 70–99)
Potassium: 4.1 mmol/L (ref 3.5–5.2)
Sodium: 144 mmol/L (ref 134–144)
eGFR: 65 mL/min/{1.73_m2} (ref >59)

## 2021-08-13 DIAGNOSIS — I10 Essential (primary) hypertension: Secondary | ICD-10-CM | POA: Diagnosis not present

## 2021-08-15 ENCOUNTER — Ambulatory Visit: Payer: Medicare HMO | Admitting: Adult Health

## 2021-08-22 DIAGNOSIS — I10 Essential (primary) hypertension: Secondary | ICD-10-CM | POA: Diagnosis not present

## 2021-09-01 DIAGNOSIS — G2581 Restless legs syndrome: Secondary | ICD-10-CM | POA: Diagnosis not present

## 2021-09-01 DIAGNOSIS — I10 Essential (primary) hypertension: Secondary | ICD-10-CM | POA: Diagnosis not present

## 2021-09-01 DIAGNOSIS — E785 Hyperlipidemia, unspecified: Secondary | ICD-10-CM | POA: Diagnosis not present

## 2021-09-01 DIAGNOSIS — I4891 Unspecified atrial fibrillation: Secondary | ICD-10-CM | POA: Diagnosis not present

## 2021-09-18 ENCOUNTER — Encounter: Payer: Self-pay | Admitting: *Deleted

## 2021-09-19 ENCOUNTER — Ambulatory Visit: Payer: Medicare HMO | Admitting: Neurology

## 2021-09-19 ENCOUNTER — Encounter: Payer: Self-pay | Admitting: Neurology

## 2021-09-19 VITALS — BP 149/73 | HR 63 | Ht 72.0 in | Wt 199.0 lb

## 2021-09-19 DIAGNOSIS — R55 Syncope and collapse: Secondary | ICD-10-CM

## 2021-09-19 NOTE — Patient Instructions (Signed)
Continue your current medications  Increase physical activity at least 20 min a day for 5 days  Follow up with your primary care physician

## 2021-09-19 NOTE — Progress Notes (Signed)
[facility]  PATIENT: Stanley Waters DOB: [birth year]  REQUESTING CLINICIAN: Deland Pretty, MD HISTORY FROM: Patient  REASON FOR VISIT: Near syncope    HISTORICAL  CHIEF COMPLAINT:  Chief Complaint  Patient presents with   New Patient (Initial Visit)    Pt alone, rm 12. Presents today ongoing concerns for dizziness for several months. The past 3 days he has been ok. Pt states that the first time he walked into his home and all of sudden came over him and felt like he was going to pass out. EMS came had told him he was dehydrated. He c/o of blurry vision but thinks that was associated to trazadone that he was started on. He stopped and that is better. He f/u with cardio and wk up was negative, PCP r/o orthrostatics at that time    HISTORY OF PRESENT ILLNESS:  This is a 86 year old gentleman with past medical history of BPH, uncontrolled hypertension, atrial fibrillation, restless leg syndrome and hyperlipidemia who is presenting with near syncope episode.  Patient reported history of BPH, because of his BPH he has to wake up multiple times during the night to urinate and he was unable to fall back asleep.  Because of this, his primary care doctor prescribed him trazodone but after starting trazodone he was feeling dizzy, had blurry vision and was feeling weak.  He also did have episode of near syncope.  He spoke with his primary care doctor, they discontinue the trazodone, the daytime sleepiness improved but he was still having episode of near syncope.  He reported at one point he felt so weak, felt like he was going to pass out that he called EMS.  When EMS arrived, they noted that his blood pressure was low in the 80s and they told him he was dehydrated and advised him to increase his water intake.  Patient reports since increase his water intake he has been doing better, still having slight dizziness but no syncope and no falls.  He has a bedside commode that he uses for  urination at night. He reported in the past 3 days he has been feeling well.  Also during this time his primary care doctor had made some change in his blood pressure medication, right now he is taking lisinopril 20 mg in the evening and 10 mg in the morning but stated blood pressure is still labile, going from 80 systolic to 975 holosystolic.  During this time also he did follow-up with cardiologist and he was told that his near syncope episode were not cardiac related.     OTHER MEDICAL CONDITIONS: BPH, HTN uncontrolled,  Atrial fibrillation, RLS, HLD     REVIEW OF SYSTEMS: Full 14 system review of systems performed and negative with exception of: as noted in the HPI   ALLERGIES: Allergies  Allergen Reactions   Prednisone Swelling, Other (See Comments) and Hypertension    Tachycardia   Doxycycline Rash    HOME MEDICATIONS: Outpatient Medications Prior to Visit  Medication Sig Dispense Refill   acetaminophen (TYLENOL) 500 MG tablet Take 1,000 mg by mouth daily as needed for moderate pain or headache.     Cholecalciferol (VITAMIN D3) 2000 units TABS Take 2,000 Units by mouth daily.     Cyanocobalamin (B-12) 2500 MCG SUBL Place 1 tablet under the tongue daily.     doxazosin (CARDURA) 8 MG tablet Take 1 tablet (8 mg total) by mouth at bedtime. 90 tablet 3   furosemide (LASIX) 20 MG tablet  Take 20 mg by mouth daily as needed for fluid.      lisinopril (PRINIVIL,ZESTRIL) 20 MG tablet Take 20 mg by mouth daily as needed (if systolic bp is 010).      Multiple Vitamins-Minerals (ICAPS) CAPS Take 1 capsule by mouth daily.      nitroGLYCERIN (NITROSTAT) 0.4 MG SL tablet PLACE 1 TABLET UNDER THE TONGUE EVERY 5 MINUTES AS NEEDED FOR CHEST PAIN 25 tablet 1   Omega-3 Fatty Acids (FISH OIL) 1000 MG CAPS Take 1,000 mg by mouth at bedtime.     Polyethyl [name] (SYSTANE OP) Place 1 drop into both eyes daily as needed (dry eyes).     polyethylene glycol (MIRALAX / GLYCOLAX) packet Take 17  g by mouth daily as needed for moderate constipation.     rivaroxaban (XARELTO) 20 MG TABS tablet Take 1 tablet (20 mg total) by mouth daily with supper. 90 tablet 2   rOPINIRole (REQUIP) 0.25 MG tablet Take 0.25 mg by mouth at bedtime.      simvastatin (ZOCOR) 5 MG tablet Take 5 mg by mouth daily.      triamcinolone cream (KENALOG) 0.1 % Apply 1 application topically 2 (two) times daily as needed (SKIN RASHES).      No facility-administered medications prior to visit.    PAST MEDICAL HISTORY: Past Medical History:  Diagnosis Date   Atrial flutter ([place])    Atypical chest pain    Myoview performed 07/23/11 was completely normal. Post stress EF 61%.   Bruit    Left asymptomatic bruit. Carotid Duplex 12/13/12 =mildly abnormal. *BILATERAL BULB/PROXIMAL ICAs: Demonstrated a mild amount of fibrous plaque w/no evidence od significant diameter reduction, tortuosity or other vascular abnormality.   Enlarged prostate    BPH - PSA of 6.7 this is consistant with his last PSA of 6.3   Hyperlipemia    Hypertension    Inguinal hernia    Inguinal hernia repair (x) 2 1991, 2011   Intestinal polyps 09/22/11   Colonoscopy removed a 2 mm sessile cecal polyp with a cold snare.   Measles    Mumps    Normal coronary arteries 2002   Peripheral neuropathy    Persistent atrial fibrillation (HCC)    Seasonal allergies    Skin cancer    Thrombocytopenia ([place]) 2011   Very mild with platelets of 135,000.   Tinnitus    Umbilical hernia    Vitamin D deficiency     PAST SURGICAL HISTORY: Past Surgical History:  Procedure Laterality Date   ATRIAL FIBRILLATION ABLATION  09/20/2018   ATRIAL FIBRILLATION ABLATION N/A 09/20/2018   Procedure: ATRIAL FIBRILLATION ABLATION;  Surgeon: Thompson Grayer, MD;  Location: [facility];  Service: Cardiovascular;  Laterality: N/A;   CARDIAC CATHETERIZATION  2002   "Normal cardiac catheterization"   CARDIOVERSION  07/07/2012   A fib - Cardioversion successful.    CARDIOVERSION N/A 06/30/2013   Procedure: CARDIOVERSION;  Surgeon: Sanda Klein, MD;  Location: [facility];  Service: Cardiovascular;  Laterality: N/A;   CARDIOVERSION N/A 08/02/2018   Procedure: CARDIOVERSION;  Surgeon: Lelon Perla, MD;  Location: [facility] ENDOSCOPY;  Service: Cardiovascular;  Laterality: N/A;   Carotid Duplex  12/13/12   For asymptomatic bruit. Mildly abnormal.  *BILATERAL BULB/PROXIMAL ICAs: Demonstrated a mild amount of fibrous plaque w/no evidence of significant diameter reduction, tortuosity or other vascular abnormality.    COLONOSCOPY W/ POLYPECTOMY  09/22/11   2 mm sessile cecal polyp was removed with a cold snare.  HAND SURGERY     INGUINAL HERNIA REPAIR  1991 & 2011   TEE WITHOUT CARDIOVERSION  07/07/2012   Procedure: TRANSESOPHAGEAL ECHOCARDIOGRAM (TEE);  Surgeon: Pixie Casino, MD;  Location: [facility] ENDOSCOPY;  Service: Cardiovascular;  Laterality: N/A;   TEE WITHOUT CARDIOVERSION N/A 08/02/2018   Procedure: TRANSESOPHAGEAL ECHOCARDIOGRAM (TEE);  Surgeon: Lelon Perla, MD;  Location: [facility]COPY;  Service: Cardiovascular;  Laterality: N/A;    FAMILY HISTORY: Family History  Problem Relation Age of Onset   Heart failure Brother     SOCIAL HISTORY: Social History   Socioeconomic History   Marital status: Widowed    Spouse name: Not on file   Number of children: 2   Years of education: Not on file   Highest education level: Not on file  Occupational History    Employer: RETIRED    Comment: Road Architect  Tobacco Use   Smoking status: Former    Types: Cigarettes    Quit date: 11/03/1963    Years since quitting: 57.9   Smokeless tobacco: Never  Vaping Use   Vaping Use: Never used  Substance and Sexual Activity   Alcohol use: No   Drug use: No   Sexual activity: Not Currently  Other Topics Concern   Not on file  Social History Narrative   Lives in [place], alone   Retired from Landscape architect   Social Determinants of Systems developer Strain: Not on file  Food Insecurity: Not on file  Transportation Needs: Not on file  Physical Activity: Not on file  Stress: Not on file  Social Connections: Not on file  Intimate Partner Violence: Not on file    PHYSICAL EXAM  GENERAL EXAM/CONSTITUTIONAL: Vitals:  Vitals:   09/19/21 0957  BP: (!) 149/73  Pulse: 63  Weight: 199 lb (90.3 kg)  Height: 6' (1.829 m)   Body mass index is 26.99 kg/m. Wt Readings from Last 3 Encounters:  09/19/21 199 lb (90.3 kg)  08/11/21 196 lb (88.9 kg)  06/04/21 201 lb 4.8 oz (91.3 kg)   Patient is in no distress; well developed, nourished and groomed; neck is supple  CARDIOVASCULAR: Examination of carotid arteries is normal; no carotid bruits Regular rate and rhythm, no murmurs Examination of peripheral vascular system by observation and palpation is normal  EYES: Pupils round and reactive to light, Visual fields full to confrontation, Extraocular movements intacts,   MUSCULOSKELETAL: Gait, strength, tone, movements noted in Neurologic exam below  NEUROLOGIC: MENTAL STATUS:  No flowsheet data found. awake, alert, oriented to person, place and time recent and remote memory intact normal attention and concentration language fluent, comprehension intact, naming intact fund of knowledge appropriate  CRANIAL NERVE:  2nd, 3rd, 4th, 6th - pupils equal and reactive to light, visual fields full to confrontation, extraocular muscles intact, no nystagmus 5th - facial sensation symmetric 7th - facial strength symmetric 8th - hearing intact 9th - palate elevates symmetrically, uvula midline 11th - shoulder shrug symmetric 12th - tongue protrusion midline  MOTOR:  normal bulk and tone, full strength in the BUE, BLE  SENSORY:  normal and symmetric to light touch, pinprick, temperature, vibration  COORDINATION:  finger-nose-finger, fine finger movements normal  REFLEXES:  deep tendon reflexes present and  symmetric  GAIT/STATION:  normal   DIAGNOSTIC DATA (LABS, IMAGING, TESTING) - I reviewed patient records, labs, notes, testing and imaging myself where available.  Lab Results  Component Value Date   WBC 4.7 08/11/2021   HGB 15.4  08/11/2021   HCT 44.5 08/11/2021   MCV 90 08/11/2021   PLT 114 (L) 08/11/2021      Component Value Date/Time   NA 144 08/11/2021 1243   K 4.1 08/11/2021 1243   CL 103 08/11/2021 1243   CO2 23 08/11/2021 1243   GLUCOSE 85 08/11/2021 1243   GLUCOSE 98 08/02/2018 1200   BUN 14 08/11/2021 1243   CREATININE 1.12 08/11/2021 1243   CREATININE 1.05 06/20/2013 1212   CALCIUM 9.8 08/11/2021 1243   PROT 6.6 07/13/2018 1513   ALBUMIN 4.4 07/13/2018 1513   AST 16 07/13/2018 1513   ALT 10 07/13/2018 1513   ALKPHOS 67 07/13/2018 1513   BILITOT 0.6 07/13/2018 1513   GFRNONAA 60 08/24/2018 0950   GFRAA 69 08/24/2018 0950   No results found for: CHOL, HDL, LDLCALC, LDLDIRECT, TRIG, CHOLHDL No results found for: HGBA1C No results found for: VITAMINB12 Lab Results  Component Value Date   TSH 3.190 07/13/2018     ASSESSMENT AND PLAN  86 y.o. year old male with  BPH, uncontrolled hypertension, atrial fibrillation, restless leg syndrome and hyperlipidemia who is presenting with near syncope episodes.  Episode started after starting trazodone for sleep related issue, since discontinued his trazodone, symptoms improved but still present.  He also have labile blood pressure, report that when he has a near syncopal episode, usually his blood pressure is low.  He increased his water intake, episodes are improving.  His neurological examination is intact, there is no evidence of any neurological etiology of his near syncope.  Denies any falls, denies any syncopal episode.  At this point I recommended patient to follow-up with his doctor, continue current medication and also to increase his activity he reported he has a treadmill and workout machine at home.  I  recommended 20-minutes of exercise 5 days a week.  Return if worse.   1. Near syncope     PLAN: Continue your current medications  Increase physical activity at least 20 min a day for 5 days  Follow up with your primary care physician   No orders of the defined types were placed in this encounter.   No orders of the defined types were placed in this encounter.   Return if symptoms worsen or fail to improve.    Alric Ran, MD 09/19/2021, 11:11 AM  [facility] [address] [place], [place] [address] [phone]

## 2021-09-23 DIAGNOSIS — R42 Dizziness and giddiness: Secondary | ICD-10-CM | POA: Diagnosis not present

## 2021-09-23 DIAGNOSIS — I10 Essential (primary) hypertension: Secondary | ICD-10-CM | POA: Diagnosis not present

## 2021-09-29 DIAGNOSIS — R972 Elevated prostate specific antigen [PSA]: Secondary | ICD-10-CM | POA: Diagnosis not present

## 2021-09-29 DIAGNOSIS — N401 Enlarged prostate with lower urinary tract symptoms: Secondary | ICD-10-CM | POA: Diagnosis not present

## 2021-09-29 DIAGNOSIS — R351 Nocturia: Secondary | ICD-10-CM | POA: Diagnosis not present

## 2021-09-30 DIAGNOSIS — D696 Thrombocytopenia, unspecified: Secondary | ICD-10-CM | POA: Diagnosis not present

## 2021-09-30 DIAGNOSIS — I10 Essential (primary) hypertension: Secondary | ICD-10-CM | POA: Diagnosis not present

## 2021-09-30 DIAGNOSIS — R55 Syncope and collapse: Secondary | ICD-10-CM | POA: Diagnosis not present

## 2021-09-30 DIAGNOSIS — I4891 Unspecified atrial fibrillation: Secondary | ICD-10-CM | POA: Diagnosis not present

## 2021-10-01 DIAGNOSIS — I4891 Unspecified atrial fibrillation: Secondary | ICD-10-CM | POA: Diagnosis not present

## 2021-10-01 DIAGNOSIS — I10 Essential (primary) hypertension: Secondary | ICD-10-CM | POA: Diagnosis not present

## 2021-10-01 DIAGNOSIS — E785 Hyperlipidemia, unspecified: Secondary | ICD-10-CM | POA: Diagnosis not present

## 2021-10-01 DIAGNOSIS — G2581 Restless legs syndrome: Secondary | ICD-10-CM | POA: Diagnosis not present

## 2021-10-06 DIAGNOSIS — I4891 Unspecified atrial fibrillation: Secondary | ICD-10-CM | POA: Diagnosis not present

## 2021-10-06 DIAGNOSIS — R42 Dizziness and giddiness: Secondary | ICD-10-CM | POA: Diagnosis not present

## 2021-10-06 DIAGNOSIS — I10 Essential (primary) hypertension: Secondary | ICD-10-CM | POA: Diagnosis not present

## 2021-10-21 DIAGNOSIS — I4891 Unspecified atrial fibrillation: Secondary | ICD-10-CM | POA: Diagnosis not present

## 2021-10-24 DIAGNOSIS — I4891 Unspecified atrial fibrillation: Secondary | ICD-10-CM | POA: Diagnosis not present

## 2021-10-28 DIAGNOSIS — S83241D Other tear of medial meniscus, current injury, right knee, subsequent encounter: Secondary | ICD-10-CM | POA: Diagnosis not present

## 2021-10-28 DIAGNOSIS — M1712 Unilateral primary osteoarthritis, left knee: Secondary | ICD-10-CM | POA: Diagnosis not present

## 2021-10-31 DIAGNOSIS — I10 Essential (primary) hypertension: Secondary | ICD-10-CM | POA: Diagnosis not present

## 2021-10-31 DIAGNOSIS — G2581 Restless legs syndrome: Secondary | ICD-10-CM | POA: Diagnosis not present

## 2021-10-31 DIAGNOSIS — I4891 Unspecified atrial fibrillation: Secondary | ICD-10-CM | POA: Diagnosis not present

## 2021-10-31 DIAGNOSIS — E785 Hyperlipidemia, unspecified: Secondary | ICD-10-CM | POA: Diagnosis not present

## 2021-11-05 DIAGNOSIS — H26492 Other secondary cataract, left eye: Secondary | ICD-10-CM | POA: Diagnosis not present

## 2021-11-05 DIAGNOSIS — H02132 Senile ectropion of right lower eyelid: Secondary | ICD-10-CM | POA: Diagnosis not present

## 2021-11-05 DIAGNOSIS — H04123 Dry eye syndrome of bilateral lacrimal glands: Secondary | ICD-10-CM | POA: Diagnosis not present

## 2021-11-05 DIAGNOSIS — H353131 Nonexudative age-related macular degeneration, bilateral, early dry stage: Secondary | ICD-10-CM | POA: Diagnosis not present

## 2021-11-06 DIAGNOSIS — I10 Essential (primary) hypertension: Secondary | ICD-10-CM | POA: Diagnosis not present

## 2021-11-06 DIAGNOSIS — Z7901 Long term (current) use of anticoagulants: Secondary | ICD-10-CM | POA: Diagnosis not present

## 2021-11-06 DIAGNOSIS — I4891 Unspecified atrial fibrillation: Secondary | ICD-10-CM | POA: Diagnosis not present

## 2021-11-06 DIAGNOSIS — R42 Dizziness and giddiness: Secondary | ICD-10-CM | POA: Diagnosis not present

## 2021-11-10 ENCOUNTER — Telehealth: Payer: Self-pay | Admitting: Internal Medicine

## 2021-11-10 NOTE — Telephone Encounter (Signed)
Patient c/o Palpitations:  High priority if patient c/o lightheadedness, shortness of breath, or chest pain  How long have you had palpitations/irregular HR/ Afib? Are you having the symptoms now? Started this morning, yes  Are you currently experiencing lightheadedness, SOB or CP? no  Do you have a history of afib (atrial fibrillation) or irregular heart rhythm? yes  Have you checked your BP or HR? (document readings if available): states BP is okay, HR went up to 103 jumping up and down  Are you experiencing any other symptoms? Feels a little weak   Patient states his heart is out of rhythm again and it just started this morning.

## 2021-11-10 NOTE — Telephone Encounter (Signed)
Outreach made to Stanley Waters.  He states he is in afib today.  HR ranging from 65-103 BPM.  States he "just feels a little weak".  He states Dr. Shelia Media had him wear a heart monitor recently.  Will contact PCP office to get report.  Stanley Waters unsure when he had his last ECHO.  Will follow up.  Left detailed message for Medical Records at Dr. Pennie Banter office requesting heart monitor results and last echo results (if any-Stanley Waters was not sure)

## 2021-11-11 NOTE — Telephone Encounter (Signed)
HM results received.  Will offer f/u with Dr. Rayann Heman to discuss afib.

## 2021-11-12 DIAGNOSIS — H26492 Other secondary cataract, left eye: Secondary | ICD-10-CM | POA: Diagnosis not present

## 2021-11-12 NOTE — Telephone Encounter (Signed)
Scheduler made outreach to Pt to schedule follow up with dr. Rayann Heman.  Per Pt he is no longer having symptoms and does not want appt.  No action needed.

## 2021-11-25 DIAGNOSIS — H52223 Regular astigmatism, bilateral: Secondary | ICD-10-CM | POA: Diagnosis not present

## 2021-11-25 DIAGNOSIS — H524 Presbyopia: Secondary | ICD-10-CM | POA: Diagnosis not present

## 2021-11-26 DIAGNOSIS — M2042 Other hammer toe(s) (acquired), left foot: Secondary | ICD-10-CM | POA: Diagnosis not present

## 2021-11-26 DIAGNOSIS — I739 Peripheral vascular disease, unspecified: Secondary | ICD-10-CM | POA: Diagnosis not present

## 2021-11-26 DIAGNOSIS — M2041 Other hammer toe(s) (acquired), right foot: Secondary | ICD-10-CM | POA: Diagnosis not present

## 2021-11-26 DIAGNOSIS — L97512 Non-pressure chronic ulcer of other part of right foot with fat layer exposed: Secondary | ICD-10-CM | POA: Diagnosis not present

## 2021-11-26 DIAGNOSIS — L84 Corns and callosities: Secondary | ICD-10-CM | POA: Diagnosis not present

## 2021-11-26 DIAGNOSIS — L97522 Non-pressure chronic ulcer of other part of left foot with fat layer exposed: Secondary | ICD-10-CM | POA: Diagnosis not present

## 2021-11-26 DIAGNOSIS — L603 Nail dystrophy: Secondary | ICD-10-CM | POA: Diagnosis not present

## 2021-11-26 IMAGING — CT CT ABD-PELV W/ CM
1 of 3 series · 12 of 32 positions shown, 18 images · IV contrast (iopamidol)
Comparison: 11/03/2006 and coronary CTA 09/14/2018

CLINICAL DATA: 86-year-old with groin pain for 3 months. History of
inguinal hernia repair.

EXAM:
CT ABDOMEN AND PELVIS WITH CONTRAST
TECHNIQUE: Multidetector CT imaging of the abdomen and pelvis was performed
using the standard protocol following bolus administration of
intravenous contrast.
Creatinine was obtained on site at [HOSPITAL] at [HOSPITAL].
Results: Creatinine 1.1 mg/dL.
CONTRAST:  100mL B6ZXOY-HTT IOPAMIDOL (B6ZXOY-HTT) INJECTION 61%

[Series 2: abd/pelvis w/cm · axial · 0.90mm/px · z∈[-479,-84]mm · 12 of 93 slices shown, 18 images]
[im 7/93  soft-tissue]
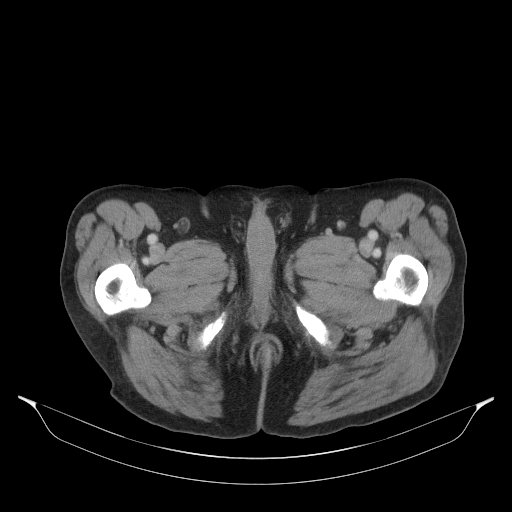
[im 7/93  bone]
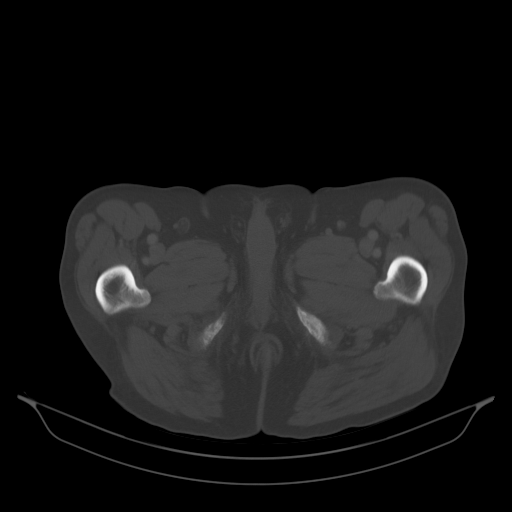
[im 14/93  soft-tissue]
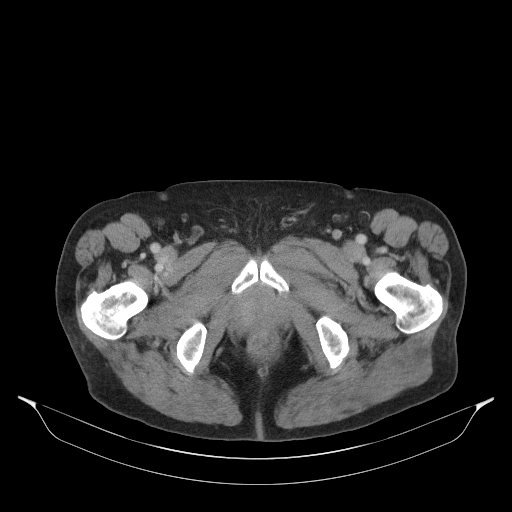
[im 20/93  soft-tissue]
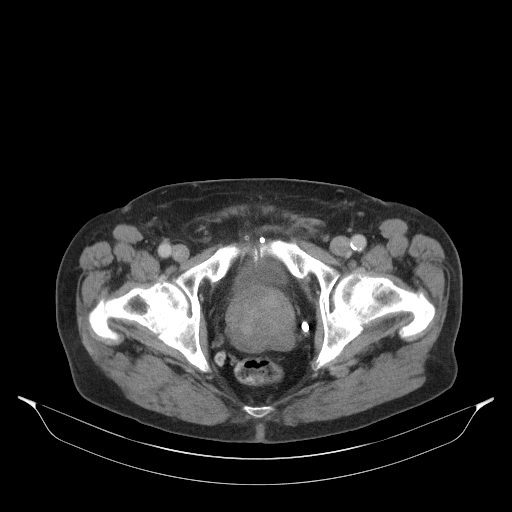
[im 27/93  soft-tissue]
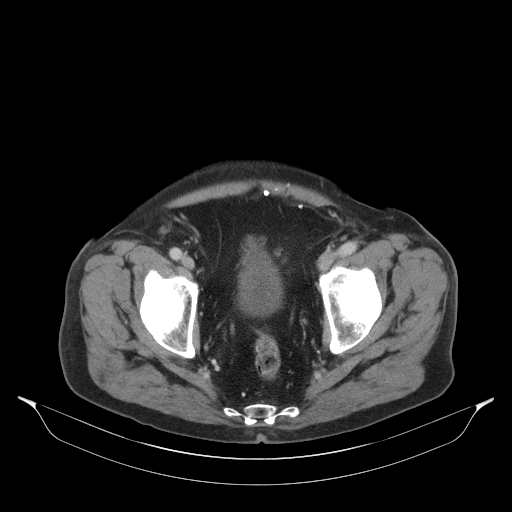
[im 33/93  soft-tissue]
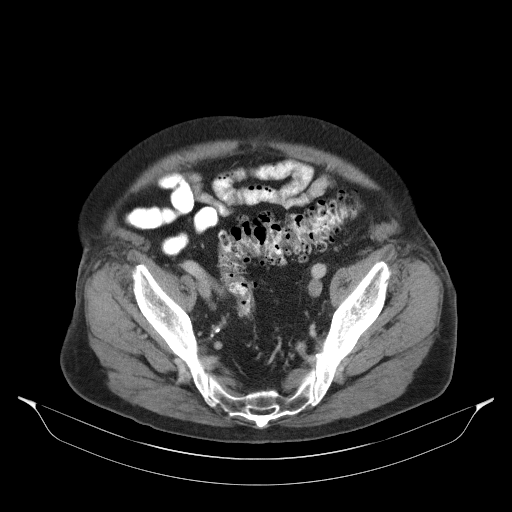
[im 40/93  soft-tissue]
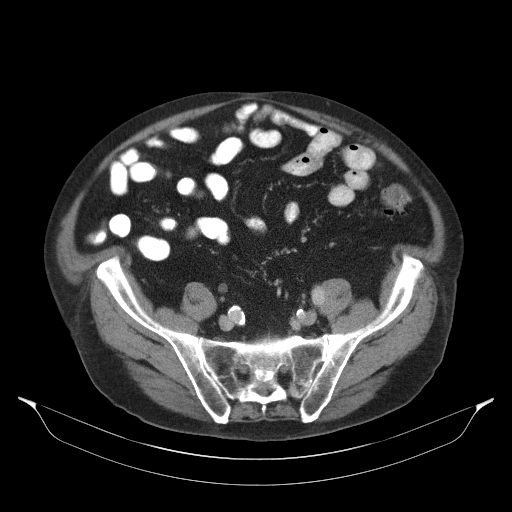
[im 53/93  soft-tissue]
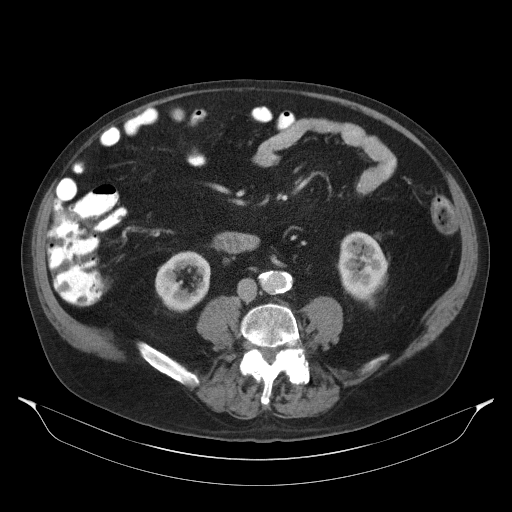
[im 60/93  soft-tissue]
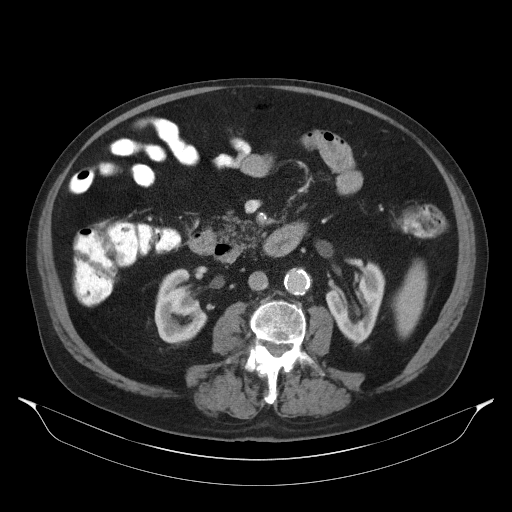
[im 66/93  soft-tissue]
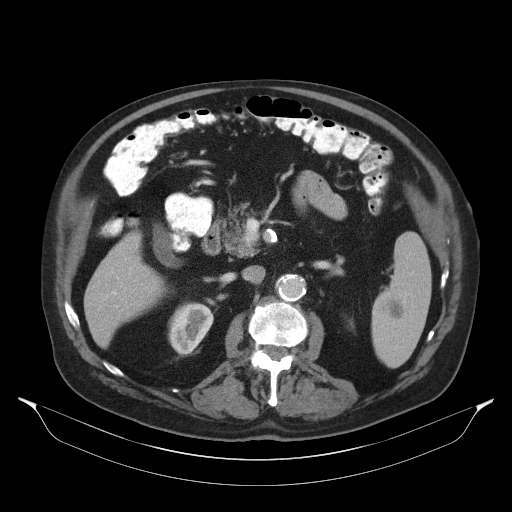
[im 66/93  lung]
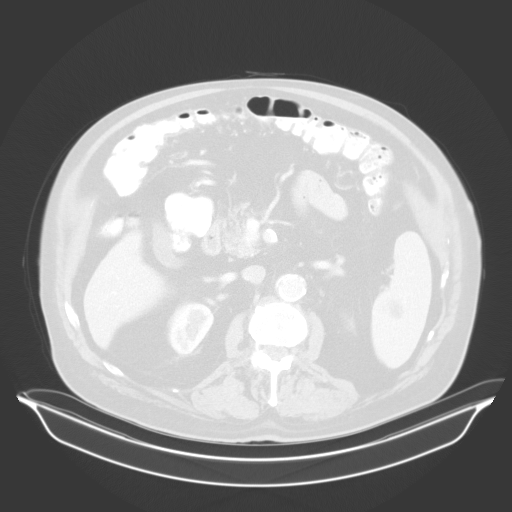
[im 66/93  bone]
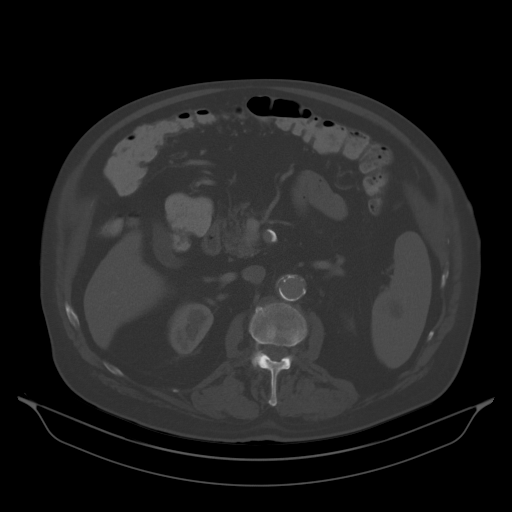
[im 73/93  soft-tissue]
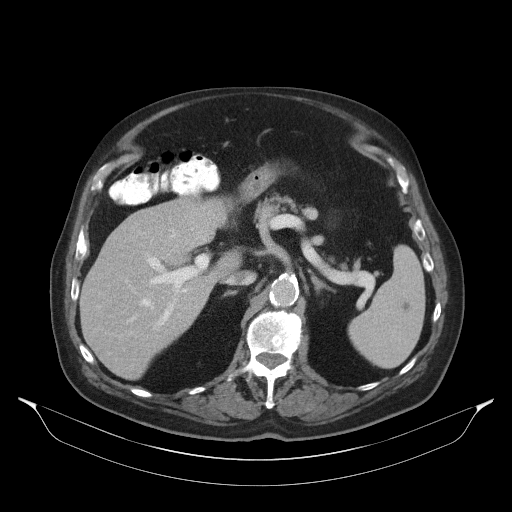
[im 73/93  lung]
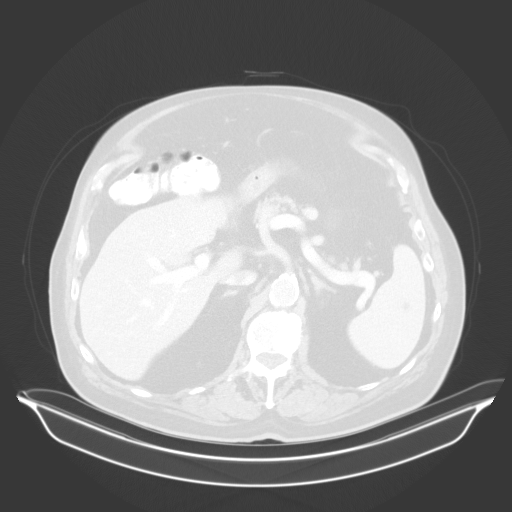
[im 79/93  soft-tissue]
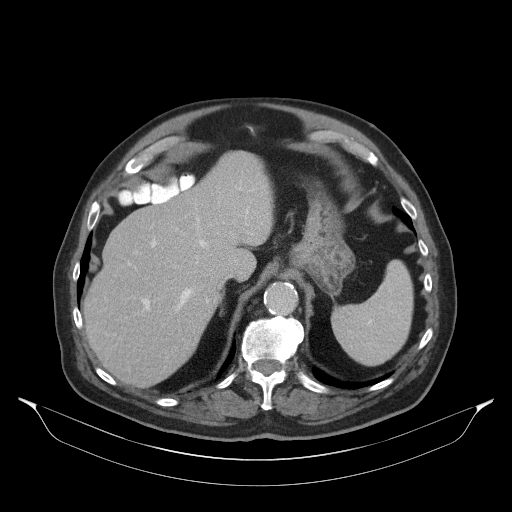
[im 79/93  lung]
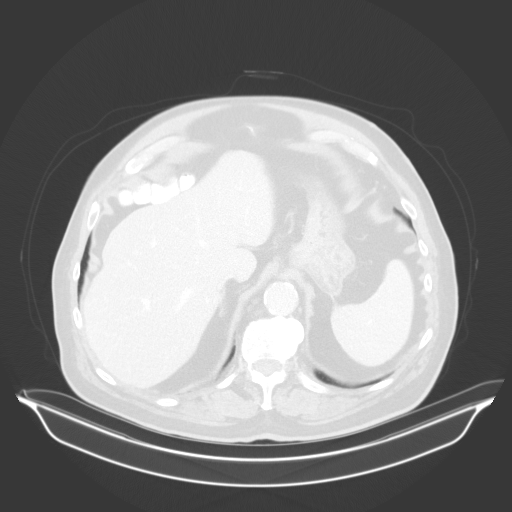
[im 86/93  soft-tissue]
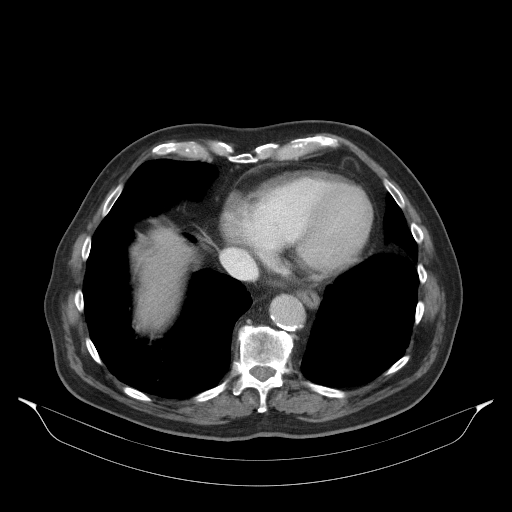
[im 86/93  lung]
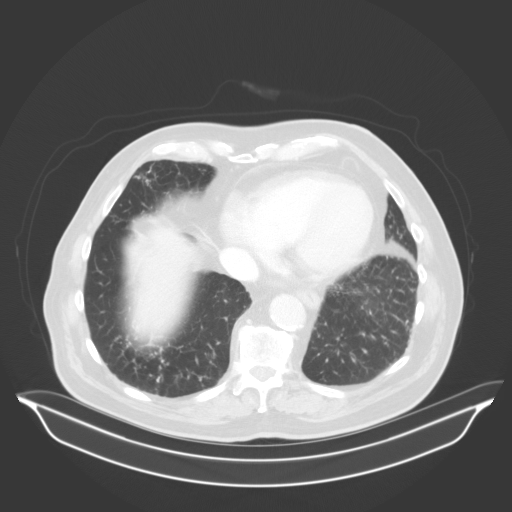

[12 of 32 positions shown; findings below may reference images not displayed]

FINDINGS: Lower chest: Tiny nodular densities at the lung bases are similar to
the prior coronary CTA and the configuration is suggestive for post
inflammatory changes. No large pleural effusions.

Hepatobiliary: No focal liver abnormality is seen. No gallstones,
gallbladder wall thickening, or biliary dilatation.

Pancreas: Duodenal diverticulum near the pancreatic head. No
evidence for pancreatic inflammation or pancreatic duct dilatation.

Spleen: Low-density structure involving the medial inferior spleen
measures 3.0 cm. This has water attenuation. This splenic lesion was
not present on the exam from 4115. This structure looks similar on
the delayed images and this could represent a cyst. There is an
additional hypodensity in the midportion of the spleen which was
present in 4115. No perisplenic stranding or edema.

Adrenals/Urinary Tract: Normal adrenal glands. Normal appearance of
both kidneys. Small amount of fluid in the urinary bladder. Evidence
for small bladder diverticula. No suspicious renal lesions. Probable
small parapelvic right renal cyst.

Stomach/Bowel: Stomach is within normal limits. No evidence of bowel
wall thickening, distention, or inflammatory changes. Extensive
diverticulosis in the sigmoid colon.

Vascular/Lymphatic: Diffuse atherosclerotic calcifications in the
abdominal aorta without aneurysm. No abdominal or pelvic lymph node
enlargement.

Reproductive: Prostate is enlarged measuring 6.3 x 5.7 x 7.1 cm.

Other: Surgical mesh in the anterior pelvis and left upper groin
region. Findings are compatible with previous inguinal hernia repair
and there is no evidence for a recurrent hernia on the left side.
Small right inguinal hernia containing fat. Negative for ascites.
Negative for free air.

Musculoskeletal: Disc space narrowing at L5-S1. Degenerative facet
disease in the lumbar spine.
IMPRESSION: 1. No acute abnormality in the abdomen or pelvis.
2. Postsurgical changes from left inguinal hernia repair. No
complicating features at the surgical repair.
3. Small right inguinal hernia containing fat.
4. Prostate enlargement.
5. New low-density structure involving the inferior spleen with
water attenuation. This could represent a splenic cyst but
indeterminate. No inflammatory changes around this splenic
low-density structure.
6. Small areas of nodularity and interstitial thickening at the lung
bases. Findings are suggestive for post inflammatory changes.

## 2021-12-02 DIAGNOSIS — I4891 Unspecified atrial fibrillation: Secondary | ICD-10-CM | POA: Diagnosis not present

## 2021-12-02 DIAGNOSIS — E785 Hyperlipidemia, unspecified: Secondary | ICD-10-CM | POA: Diagnosis not present

## 2021-12-02 DIAGNOSIS — G2581 Restless legs syndrome: Secondary | ICD-10-CM | POA: Diagnosis not present

## 2021-12-02 DIAGNOSIS — I10 Essential (primary) hypertension: Secondary | ICD-10-CM | POA: Diagnosis not present

## 2021-12-10 ENCOUNTER — Other Ambulatory Visit: Payer: Self-pay | Admitting: Internal Medicine

## 2021-12-10 DIAGNOSIS — D696 Thrombocytopenia, unspecified: Secondary | ICD-10-CM

## 2021-12-25 ENCOUNTER — Other Ambulatory Visit: Payer: Medicare HMO

## 2021-12-30 DIAGNOSIS — E785 Hyperlipidemia, unspecified: Secondary | ICD-10-CM | POA: Diagnosis not present

## 2021-12-30 DIAGNOSIS — G2581 Restless legs syndrome: Secondary | ICD-10-CM | POA: Diagnosis not present

## 2021-12-30 DIAGNOSIS — I4891 Unspecified atrial fibrillation: Secondary | ICD-10-CM | POA: Diagnosis not present

## 2021-12-30 DIAGNOSIS — I10 Essential (primary) hypertension: Secondary | ICD-10-CM | POA: Diagnosis not present

## 2022-01-07 ENCOUNTER — Ambulatory Visit
Admission: RE | Admit: 2022-01-07 | Discharge: 2022-01-07 | Disposition: A | Discharge status: Home / Self Care | Payer: Medicare HMO | Source: Ambulatory Visit | Attending: Internal Medicine | Admitting: Internal Medicine

## 2022-01-07 DIAGNOSIS — D7389 Other diseases of spleen: Secondary | ICD-10-CM | POA: Diagnosis not present

## 2022-01-07 DIAGNOSIS — D696 Thrombocytopenia, unspecified: Secondary | ICD-10-CM

## 2022-01-07 DIAGNOSIS — K5730 Diverticulosis of large intestine without perforation or abscess without bleeding: Secondary | ICD-10-CM | POA: Diagnosis not present

## 2022-01-07 DIAGNOSIS — K8689 Other specified diseases of pancreas: Secondary | ICD-10-CM | POA: Diagnosis not present

## 2022-01-07 DIAGNOSIS — K6389 Other specified diseases of intestine: Secondary | ICD-10-CM | POA: Diagnosis not present

## 2022-01-09 ENCOUNTER — Other Ambulatory Visit: Payer: Medicare HMO

## 2022-01-20 DIAGNOSIS — D696 Thrombocytopenia, unspecified: Secondary | ICD-10-CM | POA: Diagnosis not present

## 2022-01-20 DIAGNOSIS — R161 Splenomegaly, not elsewhere classified: Secondary | ICD-10-CM | POA: Diagnosis not present

## 2022-01-30 DIAGNOSIS — I10 Essential (primary) hypertension: Secondary | ICD-10-CM | POA: Diagnosis not present

## 2022-01-30 DIAGNOSIS — I4891 Unspecified atrial fibrillation: Secondary | ICD-10-CM | POA: Diagnosis not present

## 2022-01-30 DIAGNOSIS — E785 Hyperlipidemia, unspecified: Secondary | ICD-10-CM | POA: Diagnosis not present

## 2022-01-30 DIAGNOSIS — G2581 Restless legs syndrome: Secondary | ICD-10-CM | POA: Diagnosis not present

## 2022-02-04 DIAGNOSIS — I10 Essential (primary) hypertension: Secondary | ICD-10-CM | POA: Diagnosis not present

## 2022-02-04 DIAGNOSIS — I4891 Unspecified atrial fibrillation: Secondary | ICD-10-CM | POA: Diagnosis not present

## 2022-02-04 DIAGNOSIS — R051 Acute cough: Secondary | ICD-10-CM | POA: Diagnosis not present

## 2022-02-04 DIAGNOSIS — Z7901 Long term (current) use of anticoagulants: Secondary | ICD-10-CM | POA: Diagnosis not present

## 2022-02-04 DIAGNOSIS — R42 Dizziness and giddiness: Secondary | ICD-10-CM | POA: Diagnosis not present

## 2022-02-05 ENCOUNTER — Telehealth: Payer: Self-pay | Admitting: Pharmacist

## 2022-02-05 NOTE — Telephone Encounter (Signed)
[facility] is currently conducting an anticoagulation clinical trial for atrial fibrillation. The trial is sponsored by [employer] and is called Atrial Fibrillation Stroke & Embolism Prevention Trial (OCEANIC-AF) The trial is comparing the new Factor Pollyann Samples inhibitor Cecille Rubin) to Eliquis and assessing outcomes with bleeding and stroke risk (similar to ARISTOTLE and ROCKET AF, but their direct comparator was warfarin).  We are doing the study with [place] and Dr Adrian Prows, MD, [facility] is the principal investigator. Mr. Mehringer has expressed interest in participating in the trial and I wanted to get your thoughts before we proceed. ?

## 2022-02-25 DIAGNOSIS — L84 Corns and callosities: Secondary | ICD-10-CM | POA: Diagnosis not present

## 2022-02-25 DIAGNOSIS — L603 Nail dystrophy: Secondary | ICD-10-CM | POA: Diagnosis not present

## 2022-02-25 DIAGNOSIS — L97522 Non-pressure chronic ulcer of other part of left foot with fat layer exposed: Secondary | ICD-10-CM | POA: Diagnosis not present

## 2022-02-25 DIAGNOSIS — L97512 Non-pressure chronic ulcer of other part of right foot with fat layer exposed: Secondary | ICD-10-CM | POA: Diagnosis not present

## 2022-02-25 DIAGNOSIS — I739 Peripheral vascular disease, unspecified: Secondary | ICD-10-CM | POA: Diagnosis not present

## 2022-03-01 DIAGNOSIS — E785 Hyperlipidemia, unspecified: Secondary | ICD-10-CM | POA: Diagnosis not present

## 2022-03-01 DIAGNOSIS — G2581 Restless legs syndrome: Secondary | ICD-10-CM | POA: Diagnosis not present

## 2022-03-01 DIAGNOSIS — I4891 Unspecified atrial fibrillation: Secondary | ICD-10-CM | POA: Diagnosis not present

## 2022-03-01 DIAGNOSIS — I10 Essential (primary) hypertension: Secondary | ICD-10-CM | POA: Diagnosis not present

## 2022-03-06 DIAGNOSIS — E785 Hyperlipidemia, unspecified: Secondary | ICD-10-CM | POA: Diagnosis not present

## 2022-03-06 DIAGNOSIS — I10 Essential (primary) hypertension: Secondary | ICD-10-CM | POA: Diagnosis not present

## 2022-03-06 DIAGNOSIS — D696 Thrombocytopenia, unspecified: Secondary | ICD-10-CM | POA: Diagnosis not present

## 2022-03-20 DIAGNOSIS — I4891 Unspecified atrial fibrillation: Secondary | ICD-10-CM | POA: Diagnosis not present

## 2022-03-20 DIAGNOSIS — I10 Essential (primary) hypertension: Secondary | ICD-10-CM | POA: Diagnosis not present

## 2022-03-20 DIAGNOSIS — K219 Gastro-esophageal reflux disease without esophagitis: Secondary | ICD-10-CM | POA: Diagnosis not present

## 2022-03-20 DIAGNOSIS — Z0000 Encounter for general adult medical examination without abnormal findings: Secondary | ICD-10-CM | POA: Diagnosis not present

## 2022-03-20 DIAGNOSIS — M810 Age-related osteoporosis without current pathological fracture: Secondary | ICD-10-CM | POA: Diagnosis not present

## 2022-03-20 DIAGNOSIS — D696 Thrombocytopenia, unspecified: Secondary | ICD-10-CM | POA: Diagnosis not present

## 2022-03-20 DIAGNOSIS — I2510 Atherosclerotic heart disease of native coronary artery without angina pectoris: Secondary | ICD-10-CM | POA: Diagnosis not present

## 2022-03-20 DIAGNOSIS — M109 Gout, unspecified: Secondary | ICD-10-CM | POA: Diagnosis not present

## 2022-03-20 DIAGNOSIS — G4700 Insomnia, unspecified: Secondary | ICD-10-CM | POA: Diagnosis not present

## 2022-03-24 ENCOUNTER — Ambulatory Visit: Payer: Medicare HMO | Admitting: Cardiovascular Disease

## 2022-03-24 ENCOUNTER — Encounter: Payer: Self-pay | Admitting: Cardiovascular Disease

## 2022-03-24 DIAGNOSIS — I10 Essential (primary) hypertension: Secondary | ICD-10-CM

## 2022-03-24 DIAGNOSIS — E782 Mixed hyperlipidemia: Secondary | ICD-10-CM

## 2022-03-24 DIAGNOSIS — I480 Paroxysmal atrial fibrillation: Secondary | ICD-10-CM

## 2022-03-24 DIAGNOSIS — I7121 Aneurysm of the ascending aorta, without rupture: Secondary | ICD-10-CM

## 2022-03-24 DIAGNOSIS — I712 Thoracic aortic aneurysm, without rupture, unspecified: Secondary | ICD-10-CM | POA: Insufficient documentation

## 2022-03-24 NOTE — Patient Instructions (Signed)
Medication Instructions:  Your physician recommends that you continue on your current medications as directed. Please refer to the Current Medication list given to you today.  *If you need a refill on your cardiac medications before your next appointment, please call your pharmacy*   Testing/Procedures: Your physician has requested that you have an echocardiogram. Echocardiography is a painless test that uses sound waves to create images of your heart. It provides your doctor with information about the size and shape of your heart and how well your heart's chambers and valves are working. This procedure takes approximately one hour. There are no restrictions for this procedure. To be done in April 2024. This procedure will be done at [address]    Follow-Up: At [facility], you and your health needs are our priority.  As part of our continuing mission to provide you with exceptional heart care, we have created designated Provider Care Teams.  These Care Teams include your primary Cardiologist (physician) and Advanced Practice Providers (APPs -  Physician Assistants and Nurse Practitioners) who all work together to provide you with the care you need, when you need it.  We recommend signing up for the patient portal called "MyChart".  Sign up information is provided on this After Visit Summary.  MyChart is used to connect with patients for Virtual Visits (Telemedicine).  Patients are able to view lab/test results, encounter notes, upcoming appointments, etc.  Non-urgent messages can be sent to your provider as well.   To learn more about what you can do with MyChart, go to [email or web address].    Your next appointment:   12 month(s)  The format for your next appointment:   In Person  Provider:   Quay Burow, MD

## 2022-03-24 NOTE — Assessment & Plan Note (Signed)
History of PAF status post A-fib ablation by Dr. Rayann Heman 09/20/2018 maintaining sinus rhythm on Xarelto oral anticoagulation.

## 2022-03-24 NOTE — Assessment & Plan Note (Signed)
History of essential hypertension a blood pressure measured today at 142/80.  He is on Maxide.

## 2022-03-24 NOTE — Assessment & Plan Note (Signed)
Small ascending thoracic aortic aneurysm measuring 43 mm by 2D echo/22/21.  This will be repeated

## 2022-03-24 NOTE — Assessment & Plan Note (Signed)
History of hyperlipidemia on statin therapy with lipid profile performed 03/03/2021 revealing total cholesterol 131, LDL 63 and HDL of 51.

## 2022-03-24 NOTE — Progress Notes (Signed)
03/24/2022 Stanley Waters   01-Nov-1935  [id number]  Primary Physician Deland Pretty, MD Primary Cardiologist: Lorretta Harp MD FACP, [place], [place], [place]  HPI:  Stanley Waters is a 87 y.o.  mildly overweight widowed Caucasian male father of 51, grandfather of 2 grandchildren who I last saw in the office 03/21/2021.Marland KitchenHe has a history of normal coronary arteries by cath in 2002. He has PAF and has undergone a transesophageal echo, most recently by Dr. [name] July 07, 2012. His other problems include hypertension and hyperlipidemia. I adjusted his medications because he was bradycardic when I last saw him and changed his Eliquis to Xarelto at his request because of cost. He apparently has developed a rash since that time which he attributes to the Xarelto. He was admitted to the hospital on 06/29/13 with A. Fib with RVR. Ultimately underwent bedside DC cardioversion by Dr. [name] successfully to sinus rhythm after being switched from Rythmol to amiodarone. Since discharge he had multiple episodes of right true PAF responsive to when necessary supplemental metoprolol. He wore an event monitor for 2 weeks but did show episodes of bradycardia.since I saw him last in September he has had no episodes of atrial fibrillation.  His metoprolol was discontinued as was his verapamil. He has had heart rates in the 40s and is symptomatic from this with dizziness. He had a stress test performed 07/18/15 which is low risk. Because of labile hypertension and bradycardia he saw [facility]  in the office 08/14/16. Event monitor showed sinus rhythm with several episodes of sinus bradycardia in the 30s and 40s although he is for the most part asymptomatic from these.     I did refer him to Dr. Rayann Heman for consideration of A. fib ablation which occurred on 09/20/2018 successfully.  Dr. Rayann Heman saw him back on 03/31/2019 at which time he was maintaining sinus rhythm and feeling clinically improved on Xarelto.    He  did test positive for COVID-19 10/26/2019 and had 2 weeks of fever and diffuse body aches which have since subsided.    He did have a COVID-vaccine prior to me seeing him a year ago..  After the first dose he developed night responsive chest pain and episodes of PAF and had recurrent episodes of PAF after the second dose.  He did see Rosaria Ferries, PA-C in the office 02/07/2020 which time he was in sinus rhythm.  He said no recurrent A. fib since that time.  He had an event monitor that showed frequent PACs and PVCs, short runs of SVT and NSVT on 02/10/2020 and a 2D echo performed on 02/22/2020 that was essentially normal except he did have a thoracic aorta measuring 43 mm.   Since I saw him a year ago he continues to do well.  He denies chest pain or shortness of breath.   Current Meds  Medication Sig   acetaminophen (TYLENOL) 500 MG tablet Take 1,000 mg by mouth daily as needed for moderate pain or headache.   Cholecalciferol (VITAMIN D3) 2000 units TABS Take 2,000 Units by mouth daily.   Cyanocobalamin (B-12) 2500 MCG SUBL Place 1 tablet under the tongue daily.   doxazosin (CARDURA) 8 MG tablet Take 1 tablet (8 mg total) by mouth at bedtime.   furosemide (LASIX) 20 MG tablet Take 20 mg by mouth daily as needed for fluid.    Multiple Vitamins-Minerals (ICAPS) CAPS Take 1 capsule by mouth daily.    nitroGLYCERIN (NITROSTAT) 0.4 MG SL  tablet PLACE 1 TABLET UNDER THE TONGUE EVERY 5 MINUTES AS NEEDED FOR CHEST PAIN   Omega-3 Fatty Acids (FISH OIL) 1000 MG CAPS Take 1,000 mg by mouth at bedtime.   Polyethyl [name] (SYSTANE OP) Place 1 drop into both eyes daily as needed (dry eyes).   polyethylene glycol (MIRALAX / GLYCOLAX) packet Take 17 g by mouth daily as needed for moderate constipation.   rivaroxaban (XARELTO) 20 MG TABS tablet Take 1 tablet (20 mg total) by mouth daily with supper.   rOPINIRole (REQUIP) 0.25 MG tablet Take 0.25 mg by mouth at bedtime.    simvastatin (ZOCOR) 5 MG  tablet Take 5 mg by mouth daily.    triamcinolone cream (KENALOG) 0.1 % Apply 1 application topically 2 (two) times daily as needed (SKIN RASHES).      Allergies  Allergen Reactions   Prednisone Swelling, Other (See Comments) and Hypertension    Tachycardia   Doxycycline Rash    Social History   Socioeconomic History   Marital status: Widowed    Spouse name: Not on file   Number of children: 2   Years of education: Not on file   Highest education level: Not on file  Occupational History    Employer: RETIRED    Comment: Road Architect  Tobacco Use   Smoking status: Former    Types: Cigarettes    Quit date: 11/03/1963    Years since quitting: 58.4   Smokeless tobacco: Never  Vaping Use   Vaping Use: Never used  Substance and Sexual Activity   Alcohol use: No   Drug use: No   Sexual activity: Not Currently  Other Topics Concern   Not on file  Social History Narrative   Lives in [place], alone   Retired from Landscape architect   Social Determinants of Radio broadcast assistant Strain: Not on file  Food Insecurity: Not on file  Transportation Needs: Not on file  Physical Activity: Not on file  Stress: Not on file  Social Connections: Not on file  Intimate Partner Violence: Not on file     Review of Systems: General: negative for chills, fever, night sweats or weight changes.  Cardiovascular: negative for chest pain, dyspnea on exertion, edema, orthopnea, palpitations, paroxysmal nocturnal dyspnea or shortness of breath Dermatological: negative for rash Respiratory: negative for cough or wheezing Urologic: negative for hematuria Abdominal: negative for nausea, vomiting, diarrhea, bright red blood per rectum, melena, or hematemesis Neurologic: negative for visual changes, syncope, or dizziness All other systems reviewed and are otherwise negative except as noted above.    Blood pressure (!) 142/80, pulse 70, height 6' (1.829 m), weight 204 lb (92.5 kg),  SpO2 99 %.  General appearance: alert and no distress Neck: no adenopathy, no carotid bruit, no JVD, supple, symmetrical, trachea midline, and thyroid not enlarged, symmetric, no tenderness/mass/nodules Lungs: clear to auscultation bilaterally Heart: regular rate and rhythm, S1, S2 normal, no murmur, click, rub or gallop Extremities: extremities normal, atraumatic, no cyanosis or edema Pulses: 2+ and symmetric Skin: Skin color, texture, turgor normal. No rashes or lesions Neurologic: Grossly normal  EKG sinus rhythm at 70 with sinus arrhythmia.  I personally reviewed this EKG.  ASSESSMENT AND PLAN:   Paroxysmal atrial fibrillation (HCC) History of PAF status post A-fib ablation by Dr. Rayann Heman 09/20/2018 maintaining sinus rhythm on Xarelto oral anticoagulation.  Hyperlipidemia History of hyperlipidemia on statin therapy with lipid profile performed 03/03/2021 revealing total cholesterol 131, LDL 63 and HDL of 51.  Essential hypertension History of essential hypertension a blood pressure measured today at 142/80.  He is on Maxide.  Thoracic aortic aneurysm (HCC) Small ascending thoracic aortic aneurysm measuring 43 mm by 2D echo/22/21.  This will be repeated     Lorretta Harp MD [facility], [facility] 03/24/2022 10:35 AM

## 2022-03-24 NOTE — Progress Notes (Signed)
Medication samples have been provided to the patient.  Drug name: Xarelto '20mg'$  Qty: 3 bottles LOT: '22MG'$ [id number] Exp.Date: 01/2024

## 2022-04-01 DIAGNOSIS — I4891 Unspecified atrial fibrillation: Secondary | ICD-10-CM | POA: Diagnosis not present

## 2022-04-01 DIAGNOSIS — E785 Hyperlipidemia, unspecified: Secondary | ICD-10-CM | POA: Diagnosis not present

## 2022-04-01 DIAGNOSIS — I10 Essential (primary) hypertension: Secondary | ICD-10-CM | POA: Diagnosis not present

## 2022-04-01 DIAGNOSIS — G2581 Restless legs syndrome: Secondary | ICD-10-CM | POA: Diagnosis not present

## 2022-04-08 ENCOUNTER — Other Ambulatory Visit: Payer: Self-pay | Admitting: Pharmacy Technician

## 2022-04-10 DIAGNOSIS — H11433 Conjunctival hyperemia, bilateral: Secondary | ICD-10-CM | POA: Diagnosis not present

## 2022-04-22 DIAGNOSIS — I10 Essential (primary) hypertension: Secondary | ICD-10-CM | POA: Diagnosis not present

## 2022-04-22 DIAGNOSIS — L309 Dermatitis, unspecified: Secondary | ICD-10-CM | POA: Diagnosis not present

## 2022-04-23 DIAGNOSIS — B309 Viral conjunctivitis, unspecified: Secondary | ICD-10-CM | POA: Diagnosis not present

## 2022-04-23 DIAGNOSIS — H11433 Conjunctival hyperemia, bilateral: Secondary | ICD-10-CM | POA: Diagnosis not present

## 2022-05-01 DIAGNOSIS — E785 Hyperlipidemia, unspecified: Secondary | ICD-10-CM | POA: Diagnosis not present

## 2022-05-01 DIAGNOSIS — I10 Essential (primary) hypertension: Secondary | ICD-10-CM | POA: Diagnosis not present

## 2022-05-01 DIAGNOSIS — I4891 Unspecified atrial fibrillation: Secondary | ICD-10-CM | POA: Diagnosis not present

## 2022-05-19 DIAGNOSIS — H11433 Conjunctival hyperemia, bilateral: Secondary | ICD-10-CM | POA: Diagnosis not present

## 2022-05-19 DIAGNOSIS — H0288A Meibomian gland dysfunction right eye, upper and lower eyelids: Secondary | ICD-10-CM | POA: Diagnosis not present

## 2022-05-19 DIAGNOSIS — H0288B Meibomian gland dysfunction left eye, upper and lower eyelids: Secondary | ICD-10-CM | POA: Diagnosis not present

## 2022-05-19 DIAGNOSIS — B309 Viral conjunctivitis, unspecified: Secondary | ICD-10-CM | POA: Diagnosis not present

## 2022-06-01 DIAGNOSIS — E785 Hyperlipidemia, unspecified: Secondary | ICD-10-CM | POA: Diagnosis not present

## 2022-06-01 DIAGNOSIS — I10 Essential (primary) hypertension: Secondary | ICD-10-CM | POA: Diagnosis not present

## 2022-06-01 DIAGNOSIS — I4891 Unspecified atrial fibrillation: Secondary | ICD-10-CM | POA: Diagnosis not present

## 2022-06-15 DIAGNOSIS — L820 Inflamed seborrheic keratosis: Secondary | ICD-10-CM | POA: Diagnosis not present

## 2022-06-15 DIAGNOSIS — D485 Neoplasm of uncertain behavior of skin: Secondary | ICD-10-CM | POA: Diagnosis not present

## 2022-06-15 DIAGNOSIS — L2089 Other atopic dermatitis: Secondary | ICD-10-CM | POA: Diagnosis not present

## 2022-06-15 DIAGNOSIS — Z85828 Personal history of other malignant neoplasm of skin: Secondary | ICD-10-CM | POA: Diagnosis not present

## 2022-06-15 DIAGNOSIS — C4441 Basal cell carcinoma of skin of scalp and neck: Secondary | ICD-10-CM | POA: Diagnosis not present

## 2022-06-18 DIAGNOSIS — H0288A Meibomian gland dysfunction right eye, upper and lower eyelids: Secondary | ICD-10-CM | POA: Diagnosis not present

## 2022-06-18 DIAGNOSIS — H0102B Squamous blepharitis left eye, upper and lower eyelids: Secondary | ICD-10-CM | POA: Diagnosis not present

## 2022-06-18 DIAGNOSIS — Z961 Presence of intraocular lens: Secondary | ICD-10-CM | POA: Diagnosis not present

## 2022-06-18 DIAGNOSIS — H0288B Meibomian gland dysfunction left eye, upper and lower eyelids: Secondary | ICD-10-CM | POA: Diagnosis not present

## 2022-06-18 DIAGNOSIS — H0102A Squamous blepharitis right eye, upper and lower eyelids: Secondary | ICD-10-CM | POA: Diagnosis not present

## 2022-06-24 ENCOUNTER — Ambulatory Visit (INDEPENDENT_AMBULATORY_CARE_PROVIDER_SITE_OTHER): Payer: Medicare HMO

## 2022-06-24 ENCOUNTER — Encounter: Payer: Self-pay | Admitting: Podiatry

## 2022-06-24 ENCOUNTER — Ambulatory Visit: Payer: Medicare HMO | Admitting: Podiatry

## 2022-06-24 DIAGNOSIS — M2041 Other hammer toe(s) (acquired), right foot: Secondary | ICD-10-CM

## 2022-06-24 DIAGNOSIS — D72819 Decreased white blood cell count, unspecified: Secondary | ICD-10-CM | POA: Insufficient documentation

## 2022-06-24 DIAGNOSIS — D689 Coagulation defect, unspecified: Secondary | ICD-10-CM | POA: Diagnosis not present

## 2022-06-24 DIAGNOSIS — M79674 Pain in right toe(s): Secondary | ICD-10-CM | POA: Diagnosis not present

## 2022-06-24 DIAGNOSIS — M810 Age-related osteoporosis without current pathological fracture: Secondary | ICD-10-CM | POA: Insufficient documentation

## 2022-06-24 DIAGNOSIS — G4700 Insomnia, unspecified: Secondary | ICD-10-CM | POA: Insufficient documentation

## 2022-06-24 DIAGNOSIS — M79675 Pain in left toe(s): Secondary | ICD-10-CM

## 2022-06-24 DIAGNOSIS — D692 Other nonthrombocytopenic purpura: Secondary | ICD-10-CM | POA: Insufficient documentation

## 2022-06-24 DIAGNOSIS — L84 Corns and callosities: Secondary | ICD-10-CM | POA: Diagnosis not present

## 2022-06-24 DIAGNOSIS — M2042 Other hammer toe(s) (acquired), left foot: Secondary | ICD-10-CM

## 2022-06-24 DIAGNOSIS — B351 Tinea unguium: Secondary | ICD-10-CM

## 2022-06-24 DIAGNOSIS — M25552 Pain in left hip: Secondary | ICD-10-CM | POA: Insufficient documentation

## 2022-06-24 DIAGNOSIS — R161 Splenomegaly, not elsewhere classified: Secondary | ICD-10-CM | POA: Insufficient documentation

## 2022-06-24 DIAGNOSIS — R42 Dizziness and giddiness: Secondary | ICD-10-CM | POA: Insufficient documentation

## 2022-06-24 DIAGNOSIS — D734 Cyst of spleen: Secondary | ICD-10-CM | POA: Insufficient documentation

## 2022-06-25 NOTE — Progress Notes (Signed)
Subjective:   Patient ID: Stanley Waters, male   DOB: 87 y.o.   MRN: [id number]   HPI Patient presents with severe digital deformities of both feet with elongated nailbeds and severe keratotic lesion digit 2 bilateral and plantar aspect around the metatarsal third bilateral.  Patient is also on blood thinner patient does not smoke likes to be active and is looking for some kind of solution if possible   Review of Systems  All other systems reviewed and are negative.       Objective:  Physical Exam Vitals and nursing note reviewed.  Constitutional:      Appearance: He is well-developed.  Pulmonary:     Effort: Pulmonary effort is normal.  Musculoskeletal:        General: Normal range of motion.  Skin:    General: Skin is warm.  Neurological:     Mental Status: He is alert.     Neurovascular status was found to be slightly reduced but within normal limits with patient noted to have significant digital deformities of the lesser digits with rigid contracture of digits 2 structural bunion deformity severe nail disease and keratotic lesions head of proximal phalanx digit 2 bilateral medial side digit to left and underneath both feet around the third metatarsal.  Patient has good digital perfusion well oriented x3 and has high risk due to blood thinner that is on     Assessment:  Difficult condition with structural deformity moderate advancement and age and chronic keratotic lesion formation     Plan:  H&P x-rays reviewed discussed the structural deformity and the difficulty of correction and that it may need to be done someday but at this point organ to try conservative and using sterile instrumentation I debrided all lesions no angiogenic bleeding and I debrided nailbeds 1-5 both feet no angiogenic bleeding discussed wider shoes discussed cushioning of the digits with pads dispensed today and I want to see him back again in several months see the response and decide whether or not  more aggressive treatments will be necessary  X-rays indicate significant deformity of both feet with rigid contracture of the digits and structural bunion deformity

## 2022-06-27 ENCOUNTER — Other Ambulatory Visit: Payer: Self-pay

## 2022-06-27 ENCOUNTER — Encounter (HOSPITAL_BASED_OUTPATIENT_CLINIC_OR_DEPARTMENT_OTHER): Payer: Self-pay | Admitting: Emergency Medicine

## 2022-06-27 ENCOUNTER — Emergency Department (HOSPITAL_BASED_OUTPATIENT_CLINIC_OR_DEPARTMENT_OTHER)
Admission: EM | Admit: 2022-06-27 | Discharge: 2022-06-27 | Disposition: A | Discharge status: Home / Self Care | Payer: Medicare HMO | Attending: Emergency Medicine | Admitting: Emergency Medicine

## 2022-06-27 DIAGNOSIS — Z7901 Long term (current) use of anticoagulants: Secondary | ICD-10-CM | POA: Insufficient documentation

## 2022-06-27 DIAGNOSIS — I4891 Unspecified atrial fibrillation: Secondary | ICD-10-CM | POA: Diagnosis not present

## 2022-06-27 DIAGNOSIS — Z79899 Other long term (current) drug therapy: Secondary | ICD-10-CM | POA: Diagnosis not present

## 2022-06-27 DIAGNOSIS — B342 Coronavirus infection, unspecified: Secondary | ICD-10-CM

## 2022-06-27 DIAGNOSIS — U071 COVID-19: Secondary | ICD-10-CM | POA: Diagnosis not present

## 2022-06-27 DIAGNOSIS — I10 Essential (primary) hypertension: Secondary | ICD-10-CM | POA: Diagnosis not present

## 2022-06-27 DIAGNOSIS — I2510 Atherosclerotic heart disease of native coronary artery without angina pectoris: Secondary | ICD-10-CM | POA: Insufficient documentation

## 2022-06-27 DIAGNOSIS — R5383 Other fatigue: Secondary | ICD-10-CM | POA: Diagnosis present

## 2022-06-27 LAB — SARS CORONAVIRUS 2 BY RT PCR: SARS Coronavirus 2 by RT PCR: POSITIVE — AB

## 2022-06-27 MED ORDER — MOLNUPIRAVIR 200 MG PO CAPS
4.0000 | ORAL_CAPSULE | Freq: Two times a day (BID) | ORAL | 0 refills | Status: AC
Start: 2022-06-27 — End: 2022-07-02

## 2022-06-27 NOTE — Discharge Instructions (Addendum)
Note the work-up today was overall consistent with coronavirus infection.  We will treat this with a antiviral agent called molnupiravir.  It is medicines to take 4 capsules twice daily for 5 days.  Attached are quarantine guidelines.  Please do not hesitate to return to the emergency department if the worrisome signs and symptoms we discussed become apparent.

## 2022-06-27 NOTE — ED Provider Notes (Signed)
[facility] EMERGENCY DEPARTMENT Provider Note   CSN: [id number] Arrival date & time: 06/27/22  1242     History  Chief Complaint  Patient presents with   Fatigue    Stanley Waters is a 87 y.o. male.  HPI   87 year old male presents emergency department with complaints of fatigue/"feeling tired."  Patient states that symptoms began insidiously this morning.  He states his symptoms feel like when he had COVID 1 to 2 years ago.  He denies fever, chills, night sweats, sore throat, cough, chest pain, shortness of breath, abdominal pain, nausea, vomiting, urinary symptoms, change in bowel habits, hematochezia, melena.  He denies any known tick bite.  He is currently refusing blood work against recommendation and requests just to be tested for COVID to see whether or not he is positive so he does not spread it.  Past medical history significant for atrial flutter, hypertension, hyperlipidemia, atrial fibrillation on Xarelto, thoracic aortic aneurysm, coronary artery disease  Home Medications Prior to Admission medications   Medication Sig Start Date End Date Taking? Authorizing Provider  molnupiravir EUA (LAGEVRIO) 200 MG CAPS capsule Take 4 capsules (800 mg total) by mouth 2 (two) times daily for 5 days. 06/27/22 07/02/22 Yes Dion Saucier A, PA  acetaminophen (TYLENOL) 500 MG tablet Take 1,000 mg by mouth daily as needed for moderate pain or headache.    [provider]  amLODipine (NORVASC) 5 MG tablet Take 5 mg by mouth daily. 03/26/22   [provider]  Cholecalciferol (VITAMIN D3) 2000 units TABS Take 2,000 Units by mouth daily.    [provider]  Cyanocobalamin (B-12) 2500 MCG SUBL Place 1 tablet under the tongue daily.    [provider]  doxazosin (CARDURA) 8 MG tablet Take 1 tablet (8 mg total) by mouth at bedtime. 10/04/13   Lorretta Harp, MD  erythromycin ophthalmic ointment SMARTSIG:sparingly In Eye(s) Every Night 06/18/22    [provider]  ketorolac (ACULAR) 0.5 % ophthalmic solution 1 drop 4 (four) times daily. 06/18/22   [provider]  Multiple Vitamins-Minerals (ICAPS) CAPS Take 1 capsule by mouth daily.     [provider]  mupirocin ointment (BACTROBAN) 2 % Apply topically. 02/25/22   [provider]  nitroGLYCERIN (NITROSTAT) 0.4 MG SL tablet PLACE 1 TABLET UNDER THE TONGUE EVERY 5 MINUTES AS NEEDED FOR CHEST PAIN 02/01/20   Allred, Jeneen Rinks, MD  Omega-3 Fatty Acids (FISH OIL) 1000 MG CAPS Take 1,000 mg by mouth at bedtime.    [provider]  Polyethyl [name] (SYSTANE OP) Place 1 drop into both eyes daily as needed (dry eyes).    [provider]  polyethylene glycol (MIRALAX / GLYCOLAX) packet Take 17 g by mouth daily as needed for moderate constipation.    [provider]  prednisoLONE acetate (PRED FORTE) 1 % ophthalmic suspension 1 drop daily. 04/23/22   [provider]  rivaroxaban (XARELTO) 20 MG TABS tablet Take 1 tablet (20 mg total) by mouth daily with supper. 12/21/18   Allred, Jeneen Rinks, MD  rOPINIRole (REQUIP) 0.25 MG tablet Take 0.25 mg by mouth at bedtime.  06/06/18   [provider]  simvastatin (ZOCOR) 5 MG tablet Take 5 mg by mouth daily.     [provider]  triamcinolone cream (KENALOG) 0.1 % Apply 1 application topically 2 (two) times daily as needed (SKIN RASHES).  01/02/14   [provider]  triamterene-hydrochlorothiazide (MAXZIDE-25) 37.5-25 MG tablet Take 0.5 tablets by mouth  daily. 03/20/22   [provider]      Allergies    Prednisone, Trazodone hcl, and Doxycycline    Review of Systems   Review of Systems  All other systems reviewed and are negative.   Physical Exam Updated Vital Signs BP (!) 146/79   Pulse 77   Temp 98.6 F (37 C) (Oral)   Resp 16   Ht 6' (1.829 m)   Wt 90.7 kg   SpO2 97%   BMI 27.12 kg/m  Physical Exam Vitals and nursing note reviewed.   Constitutional:      General: He is not in acute distress.    Appearance: He is well-developed.  HENT:     Head: Normocephalic and atraumatic.     Right Ear: Tympanic membrane normal.     Left Ear: Tympanic membrane normal.     Nose: Nose normal. No congestion or rhinorrhea.     Mouth/Throat:     Mouth: Mucous membranes are moist.     Pharynx: Oropharynx is clear. No posterior oropharyngeal erythema.  Eyes:     Conjunctiva/sclera: Conjunctivae normal.  Cardiovascular:     Rate and Rhythm: Normal rate and regular rhythm.     Pulses: Normal pulses.     Heart sounds: No murmur heard. Pulmonary:     Effort: Pulmonary effort is normal. No respiratory distress.     Breath sounds: Normal breath sounds. No wheezing or rales.  Abdominal:     Palpations: Abdomen is soft.     Tenderness: There is no abdominal tenderness. There is no right CVA tenderness or left CVA tenderness.  Musculoskeletal:        General: No swelling.     Cervical back: Normal range of motion and neck supple. No rigidity or tenderness.     Right lower leg: No edema.     Left lower leg: No edema.  Skin:    General: Skin is warm and dry.     Capillary Refill: Capillary refill takes less than 2 seconds.  Neurological:     General: No focal deficit present.     Mental Status: He is alert and oriented to person, place, and time.  Psychiatric:        Mood and Affect: Mood normal.     ED Results / Procedures / Treatments   Labs (all labs ordered are listed, but only abnormal results are displayed) Labs Reviewed  SARS CORONAVIRUS 2 BY RT PCR - Abnormal; Notable for the following components:      Result Value   SARS Coronavirus 2 by RT PCR POSITIVE (*)    All other components within normal limits    EKG EKG Interpretation  Date/Time:  Saturday June 27 2022 13:01:05 EDT Ventricular Rate:  78 PR Interval:  237 QRS Duration: 95 QT Interval:  384 QTC Calculation: 438 R Axis:   20 Text Interpretation: Sinus  rhythm Prolonged PR interval No significant change since last tracing Confirmed by Isla Pence [phone]) on 06/27/2022 1:05:48 PM  Radiology No results found.  Procedures Procedures    Medications Ordered in ED Medications - No data to display  ED Course/ Medical Decision Making/ A&P                           Medical Decision Making  This patient presents to the ED for concern of fatigue, this involves an extensive number of treatment options, and is a complaint that carries with it a  high risk of complications and morbidity.  The differential diagnosis includes anemia, influenza, tickborne illness, coronavirus, influenza, sepsis   Co morbidities that complicate the patient evaluation  See HPI   Additional history obtained:  Additional history obtained from EMR   Lab Tests:  I Ordered, and personally interpreted labs.  The pertinent results include: Positive coronavirus   Imaging Studies ordered:  N/a   Cardiac Monitoring: / EKG:  The patient was maintained on a cardiac monitor.  I personally viewed and interpreted the cardiac monitored which showed an underlying rhythm of: Sinus rhythm   Consultations Obtained:  N/a   Problem List / ED Course / Critical interventions / Medication management  Coronavirus Reevaluation of the patient showed that the patient stayed the same I have reviewed the patients home medicines and have made adjustments as needed   Social Determinants of Health:  Denies current tobacco or illicit drug use.   Test / Admission - Considered:  Vitals signs significant for mild hypertension with a blood pressure 146/79.  Recommend close follow-up with PCP regarding elevation of blood pressure. Otherwise within normal range and stable throughout visit. Laboratory studies significant for: See above Patient symptoms likely secondary to positive coronavirus.  Patient was educated regarding proper quarantine protocols.  He was prescribed  molnupiravir as he is high risk for hospitalization secondary to coronavirus.  Patient nonhypoxic and saturating 97 on room air and in no acute respiratory distress.  Treatment plan was discussed at length with patient he acknowledged understanding was agreeable to said plan. Worrisome signs and symptoms were discussed with the patient, and the patient acknowledged understanding to return to the ED if noticed. Patient was stable upon discharge.          Final Clinical Impression(s) / ED Diagnoses Final diagnoses:  Coronavirus infection    Rx / DC Orders ED Discharge Orders          Ordered    molnupiravir EUA (LAGEVRIO) 200 MG CAPS capsule  2 times daily        06/27/22 1358              Wilnette Kales, [place] 06/27/22 1401    Isla Pence, MD 06/27/22 1454

## 2022-06-27 NOTE — ED Triage Notes (Signed)
Pt reports fatigue and not feeling well since this morning; sts he felt this way when he had Covid before

## 2022-06-27 NOTE — ED Notes (Signed)
Reports fatigues since this am . Denies n/v  some coughing  states it a dry cough.denies nasal drainage. Alert x4

## 2022-07-02 DIAGNOSIS — I4891 Unspecified atrial fibrillation: Secondary | ICD-10-CM | POA: Diagnosis not present

## 2022-07-02 DIAGNOSIS — E785 Hyperlipidemia, unspecified: Secondary | ICD-10-CM | POA: Diagnosis not present

## 2022-07-02 DIAGNOSIS — I10 Essential (primary) hypertension: Secondary | ICD-10-CM | POA: Diagnosis not present

## 2022-07-08 DIAGNOSIS — L309 Dermatitis, unspecified: Secondary | ICD-10-CM | POA: Diagnosis not present

## 2022-07-08 DIAGNOSIS — M25511 Pain in right shoulder: Secondary | ICD-10-CM | POA: Diagnosis not present

## 2022-07-17 ENCOUNTER — Encounter (HOSPITAL_COMMUNITY): Payer: Self-pay | Admitting: Emergency Medicine

## 2022-07-17 ENCOUNTER — Emergency Department (HOSPITAL_COMMUNITY)
Admission: EM | Admit: 2022-07-17 | Discharge: 2022-07-18 | Disposition: A | Discharge status: Home / Self Care | Payer: Medicare HMO | Attending: Emergency Medicine | Admitting: Emergency Medicine

## 2022-07-17 ENCOUNTER — Emergency Department (HOSPITAL_COMMUNITY): Payer: Medicare HMO

## 2022-07-17 DIAGNOSIS — R079 Chest pain, unspecified: Secondary | ICD-10-CM | POA: Diagnosis not present

## 2022-07-17 DIAGNOSIS — I10 Essential (primary) hypertension: Secondary | ICD-10-CM | POA: Insufficient documentation

## 2022-07-17 DIAGNOSIS — I2510 Atherosclerotic heart disease of native coronary artery without angina pectoris: Secondary | ICD-10-CM | POA: Insufficient documentation

## 2022-07-17 DIAGNOSIS — R5383 Other fatigue: Secondary | ICD-10-CM | POA: Insufficient documentation

## 2022-07-17 DIAGNOSIS — Z79899 Other long term (current) drug therapy: Secondary | ICD-10-CM | POA: Diagnosis not present

## 2022-07-17 DIAGNOSIS — I480 Paroxysmal atrial fibrillation: Secondary | ICD-10-CM | POA: Diagnosis not present

## 2022-07-17 DIAGNOSIS — Z7901 Long term (current) use of anticoagulants: Secondary | ICD-10-CM | POA: Diagnosis not present

## 2022-07-17 DIAGNOSIS — H538 Other visual disturbances: Secondary | ICD-10-CM | POA: Diagnosis not present

## 2022-07-17 DIAGNOSIS — R011 Cardiac murmur, unspecified: Secondary | ICD-10-CM | POA: Diagnosis not present

## 2022-07-17 DIAGNOSIS — R7989 Other specified abnormal findings of blood chemistry: Secondary | ICD-10-CM | POA: Diagnosis not present

## 2022-07-17 DIAGNOSIS — R0789 Other chest pain: Secondary | ICD-10-CM | POA: Insufficient documentation

## 2022-07-17 LAB — BASIC METABOLIC PANEL WITH GFR
Anion gap: 11 (ref 5–15)
BUN: 24 mg/dL — ABNORMAL HIGH (ref 8–23)
CO2: 25 mmol/L (ref 22–32)
Calcium: 9.6 mg/dL (ref 8.9–10.3)
Chloride: 100 mmol/L (ref 98–111)
Creatinine, Ser: 1.14 mg/dL (ref 0.61–1.24)
GFR, Estimated: >60 mL/min (ref >60)
Glucose, Bld: 102 mg/dL — ABNORMAL HIGH (ref 70–99)
Potassium: 3.9 mmol/L (ref 3.5–5.1)
Sodium: 136 mmol/L (ref 135–145)

## 2022-07-17 LAB — CBC
HCT: 47.4 % (ref 39.0–52.0)
Hemoglobin: 16.2 g/dL (ref 13.0–17.0)
MCH: 32 pg (ref 26.0–34.0)
MCHC: 34.2 g/dL (ref 30.0–36.0)
MCV: 93.5 fL (ref 80.0–100.0)
Platelets: 122 10*3/uL — ABNORMAL LOW (ref 150–400)
RBC: 5.07 MIL/uL (ref 4.22–5.81)
RDW: 12.6 % (ref 11.5–15.5)
WBC: 7.6 10*3/uL (ref 4.0–10.5)
nRBC: 0 % (ref 0.0–0.2)

## 2022-07-17 LAB — TROPONIN I (HIGH SENSITIVITY)
Troponin I (High Sensitivity): 6 ng/L (ref <18)
Troponin I (High Sensitivity): 6 ng/L (ref <18)

## 2022-07-17 NOTE — ED Triage Notes (Signed)
Patient BIB [facility] from home for evaluation of 8/10 chest pressure and blurred vision that started while driving. Patient continued driving home and took two SL NTG which did not improve pain, but states pain improved after sitting down several minutes later and resting. History of afib. 20g saline lock in left AC. Patient is alert, oriented, and in no apparent distres at this time.  20g saline lock in left AC  BP 158/96 96%

## 2022-07-17 NOTE — ED Provider Triage Note (Signed)
Emergency Medicine Provider Triage Evaluation Note  BROWNIE NEHME , a 87 y.o. male  was evaluated in triage.  Pt complains of chest pressure that started at 1100 and lasted till around 1200. He reports that he took two SL nitro and didn't improve until he sat down and rested. Denies any SOB. Reports he feels fine now. Denies any n/v. [name]  Review of Systems  Positive:  Negative:   Physical Exam  BP (!) 153/88 (BP Location: Right Arm)   Pulse 64   Temp 98.1 F (36.7 C) (Oral)   Resp 16   SpO2 97%  Gen:   Awake, no distress   Resp:  Normal effort  MSK:   Moves extremities without difficulty  Other:  RRR.  Medical Decision Making  Medically screening exam initiated at 1:23 PM.  Appropriate orders placed.  [name] was informed that the remainder of the evaluation will be completed by another provider, this initial triage assessment does not replace that evaluation, and the importance of remaining in the ED until their evaluation is complete.  Labs and CXR ordered. Patient well appearing.    Sherrell Puller, PA-C 07/17/22 1325

## 2022-07-18 NOTE — Discharge Instructions (Addendum)
You are seen here today for your chest pain.  Your physical exam, vital signs, blood work, chest x-ray and EKG were very reassuring.  Please follow up with your cardiologist in the outpatient setting and return to the ER with any new severe symptoms.

## 2022-07-18 NOTE — ED Provider Notes (Signed)
[facility] [place] EMERGENCY DEPARTMENT Provider Note   CSN: [id number] Arrival date & time: 07/17/22  1239     History  Chief Complaint  Patient presents with   Chest Pain    Stanley Waters is a 87 y.o. male who presents to the ED for onset of central chest tightness and feeling fatigued while he was out shopping today.  There was no chest pain or shortness of breath associated with the symptoms.  He said he returned home, less than 1 mile away from the store via car, and took a nitroglycerin.  This did not help with his symptoms.  If you consider took another 1 and called EMS.  Still no improvement with nitroglycerin, patient laid flat awaiting for EMS and by the time they arrived his symptoms had resolved.  He states in total it lasted less than 30 minutes.  Still presented to the EMS concern given history of Cardiac presentations in the past.  States he has been asymptomatic since his arrival to the emergency department 13 hours prior to my initial evaluation.  States he feels well and is ready to be discharged home.  I personally reviewed his medical records previous history of CAD, paroxysmal A-fib, hypertension, anticoagulation on Xarelto.  HPI     Home Medications Prior to Admission medications   Medication Sig Start Date End Date Taking? Authorizing Provider  acetaminophen (TYLENOL) 500 MG tablet Take 1,000 mg by mouth daily as needed for moderate pain or headache.    [provider]  amLODipine (NORVASC) 5 MG tablet Take 5 mg by mouth daily. 03/26/22   [provider]  Cholecalciferol (VITAMIN D3) 2000 units TABS Take 2,000 Units by mouth daily.    [provider]  Cyanocobalamin (B-12) 2500 MCG SUBL Place 1 tablet under the tongue daily.    [provider]  doxazosin (CARDURA) 8 MG tablet Take 1 tablet (8 mg total) by mouth at bedtime. 10/04/13   Lorretta Harp, MD  erythromycin ophthalmic ointment SMARTSIG:sparingly In  Eye(s) Every Night 06/18/22   [provider]  ketorolac (ACULAR) 0.5 % ophthalmic solution 1 drop 4 (four) times daily. 06/18/22   [provider]  Multiple Vitamins-Minerals (ICAPS) CAPS Take 1 capsule by mouth daily.     [provider]  mupirocin ointment (BACTROBAN) 2 % Apply topically. 02/25/22   [provider]  nitroGLYCERIN (NITROSTAT) 0.4 MG SL tablet PLACE 1 TABLET UNDER THE TONGUE EVERY 5 MINUTES AS NEEDED FOR CHEST PAIN 02/01/20   Allred, Jeneen Rinks, MD  Omega-3 Fatty Acids (FISH OIL) 1000 MG CAPS Take 1,000 mg by mouth at bedtime.    [provider]  Polyethyl [name] (SYSTANE OP) Place 1 drop into both eyes daily as needed (dry eyes).    [provider]  polyethylene glycol (MIRALAX / GLYCOLAX) packet Take 17 g by mouth daily as needed for moderate constipation.    [provider]  prednisoLONE acetate (PRED FORTE) 1 % ophthalmic suspension 1 drop daily. 04/23/22   [provider]  rivaroxaban (XARELTO) 20 MG TABS tablet Take 1 tablet (20 mg total) by mouth daily with supper. 12/21/18   Allred, Jeneen Rinks, MD  rOPINIRole (REQUIP) 0.25 MG tablet Take 0.25 mg by mouth at bedtime.  06/06/18   [provider]  simvastatin (ZOCOR) 5 MG tablet Take 5 mg by mouth daily.     [provider]  triamcinolone cream (KENALOG) 0.1 % Apply 1 application topically 2 (two) times daily  as needed (SKIN RASHES).  01/02/14   [provider]  triamterene-hydrochlorothiazide (MAXZIDE-25) 37.5-25 MG tablet Take 0.5 tablets by mouth daily. 03/20/22   [provider]      Allergies    Prednisone, Trazodone hcl, and Doxycycline    Review of Systems   Review of Systems  Constitutional:  Positive for fatigue. Negative for activity change, appetite change and fever.  HENT: Negative.    Respiratory:  Positive for chest tightness. Negative for shortness of breath.   Cardiovascular: Negative.   Genitourinary:  Negative.   Neurological: Negative.     Physical Exam Updated Vital Signs BP (!) 146/100   Pulse 67   Temp 97.9 F (36.6 C) (Oral)   Resp 16   SpO2 99%  Physical Exam Vitals and nursing note reviewed.  Constitutional:      Appearance: He is not ill-appearing or toxic-appearing.  HENT:     Head: Normocephalic and atraumatic.     Mouth/Throat:     Mouth: Mucous membranes are moist.     Pharynx: No oropharyngeal exudate or posterior oropharyngeal erythema.  Eyes:     General:        Right eye: No discharge.        Left eye: No discharge.     Conjunctiva/sclera: Conjunctivae normal.  Cardiovascular:     Rate and Rhythm: Normal rate and regular rhythm.     Pulses: Normal pulses.     Heart sounds: Murmur heard.     Systolic murmur is present.  Pulmonary:     Effort: Pulmonary effort is normal. No respiratory distress.     Breath sounds: Normal breath sounds. No wheezing or rales.  Chest:     Chest wall: No tenderness or edema.  Abdominal:     General: Bowel sounds are normal. There is no distension.     Palpations: Abdomen is soft.     Tenderness: There is no abdominal tenderness.  Musculoskeletal:        General: No deformity.     Cervical back: Neck supple.     Right lower leg: No tenderness. No edema.     Left lower leg: No tenderness. No edema.  Skin:    General: Skin is warm and dry.     Capillary Refill: Capillary refill takes less than 2 seconds.  Neurological:     General: No focal deficit present.     Mental Status: He is alert and oriented to person, place, and time. Mental status is at baseline.  Psychiatric:        Mood and Affect: Mood normal.     ED Results / Procedures / Treatments   Labs (all labs ordered are listed, but only abnormal results are displayed) Labs Reviewed  BASIC METABOLIC PANEL - Abnormal; Notable for the following components:      Result Value   Glucose, Bld 102 (*)    BUN 24 (*)    All other components within normal limits   CBC - Abnormal; Notable for the following components:   Platelets 122 (*)    All other components within normal limits  TROPONIN I (HIGH SENSITIVITY)  TROPONIN I (HIGH SENSITIVITY)    EKG EKG Interpretation  Date/Time:  Friday July 17 2022 12:44:54 EDT Ventricular Rate:  65 PR Interval:  226 QRS Duration: 76 QT Interval:  398 QTC Calculation: 413 R Axis:   4 Text Interpretation: Sinus rhythm with 1st degree A-V block with Premature atrial complexes in a pattern of bigeminy  Low voltage QRS Septal infarct , age undetermined Abnormal ECG Confirmed by Quintella Reichert [phone]) on 07/18/2022 1:13:36 AM  Radiology DG Chest 2 View  Result Date: 07/17/2022 CLINICAL DATA:  Chest tightness for 1 day. EXAM: CHEST - 2 VIEW COMPARISON:  Chest radiograph dated 07/19/2018. FINDINGS: The heart size and mediastinal contours are within normal limits. Vascular calcifications are seen in the aortic arch. The lungs are clear. Degenerative changes are seen in the spine. IMPRESSION: No active cardiopulmonary disease. Aortic Atherosclerosis (ICD10-I70.0). Electronically Signed   By: Zerita Boers M.D.   On: 07/17/2022 13:52    Procedures Procedures    Medications Ordered in ED Medications - No data to display  ED Course/ Medical Decision Making/ A&P                           Medical Decision Making 87 year old male with episode of chest tightness lasting approximate 30 minutes earlier in the evening.  Asymptomatic at this time.  Hypertensive on intake, vitals otherwise normal.  Pulmonary exam is normal, abdominal exam is benign.  Neurovascular intact in extremities.  Differential diagnosis includes not limited to ACS, PE, pleural effusion, dysrhythmia, pneumothorax, pneumonia, metabolic derangement, GERD.  Amount and/or Complexity of Data Reviewed Labs: ordered.    Details: CBC without leukocytosis or anemia, BMP with mildly elevated BUN but otherwise unremarkable.  Troponin negative x2, 6.   Radiology:     Details:  Chest x-ray visualized with provider negative for acute cardiopulmonary disease ECG/medicine tests:     Details: EKG as above with bigeminy but no dangerous dysrhythmia or ST changes to suggest ischemia.   Patient continues to be asymptomatic.  Well-appearing with reassuring vital signs at this time.  No further work-up warranted in the ER this evening.  Question possible episode of A-fib with RVR earlier in the day spontaneously resolved, however given this was unwitnessed no clear etiology of his symptomatology can be discerned.  There is no appear to be any emergent finding on his work-up this evening.  Recommend close outpatient follow-up with cardiology.  [facility]  voiced understanding of his medical evaluation and treatment plan. Each of their questions answered to their expressed satisfaction.  Return precautions were given.  Patient is well-appearing, stable, and was discharged in good condition.    This chart was dictated using voice recognition software, Dragon. Despite the best efforts of this provider to proofread and correct errors, errors may still occur which can change documentation meaning.   Final Clinical Impression(s) / ED Diagnoses Final diagnoses:  Chest pain, unspecified type    Rx / DC Orders ED Discharge Orders     None         Aura Dials 07/18/22 0412    Quintella Reichert, MD 07/18/22 [phone]

## 2022-07-20 ENCOUNTER — Telehealth: Payer: Self-pay | Admitting: Cardiovascular Disease

## 2022-07-20 MED ORDER — NITROGLYCERIN 0.4 MG SL SUBL: SUBLINGUAL_TABLET | SUBLINGUAL | 0 refills | Status: AC

## 2022-07-20 NOTE — Telephone Encounter (Signed)
Pt's medication was sent to pt's pharmacy as requested. Confirmation received.  °

## 2022-07-20 NOTE — Telephone Encounter (Signed)
 *  STAT* If patient is at the pharmacy, call can be transferred to refill team.   1. Which medications need to be refilled? (please list name of each medication and dose if known) nitroGLYCERIN (NITROSTAT) 0.4 MG SL tablet  2. Which pharmacy/location (including street and city if local pharmacy) is medication to be sent to? [place], [place].  3. Do they need a 30 day or 90 day supply? 1 bottle

## 2022-07-21 DIAGNOSIS — Z85828 Personal history of other malignant neoplasm of skin: Secondary | ICD-10-CM | POA: Diagnosis not present

## 2022-07-21 DIAGNOSIS — L308 Other specified dermatitis: Secondary | ICD-10-CM | POA: Diagnosis not present

## 2022-07-21 DIAGNOSIS — L720 Epidermal cyst: Secondary | ICD-10-CM | POA: Diagnosis not present

## 2022-07-21 DIAGNOSIS — C4441 Basal cell carcinoma of skin of scalp and neck: Secondary | ICD-10-CM | POA: Diagnosis not present

## 2022-08-01 DIAGNOSIS — I10 Essential (primary) hypertension: Secondary | ICD-10-CM | POA: Diagnosis not present

## 2022-08-01 DIAGNOSIS — I4891 Unspecified atrial fibrillation: Secondary | ICD-10-CM | POA: Diagnosis not present

## 2022-08-01 DIAGNOSIS — E785 Hyperlipidemia, unspecified: Secondary | ICD-10-CM | POA: Diagnosis not present

## 2022-08-16 NOTE — Progress Notes (Signed)
PCP:  Deland Pretty, MD Primary Cardiologist: Quay Burow, MD Electrophysiologist: Melida Quitter, MD   Stanley Waters is a 87 y.o. male seen today for Melida Quitter, MD for acute visit due to irregular heart eat .  Since last being seen in our clinic the patient reports doing OK overall. He had a near syncopal episode about a week ago and says EMS came out and told him he was dehydrated. BP 80-90s at that time. Meds were adjusted and he hasn't felt "quite right" since then. He cannot explain to me how or why he doesn't feel well. He is able to weed eat and load a wood splitter without SOB. He has no SOB with ADLs. He denies CP or syncope. He denies overt fatigue, just "doesn't feel well" for approximately a week. He was told his HR was irregular at PCP (no EKG was done) and it has had him worried.    Past Medical History:  Diagnosis Date   Atrial flutter ([place])    Atypical chest pain    Myoview performed 07/23/11 was completely normal. Post stress EF 61%.   Bruit    Left asymptomatic bruit. Carotid Duplex 12/13/12 =mildly abnormal. *BILATERAL BULB/PROXIMAL ICAs: Demonstrated a mild amount of fibrous plaque w/no evidence od significant diameter reduction, tortuosity or other vascular abnormality.   Enlarged prostate    BPH - PSA of 6.7 this is consistant with his last PSA of 6.3   Hyperlipemia    Hypertension    Inguinal hernia    Inguinal hernia repair (x) 2 1991, 2011   Intestinal polyps 09/22/11   Colonoscopy removed a 2 mm sessile cecal polyp with a cold snare.   Measles    Mumps    Normal coronary arteries 2002   Peripheral neuropathy    Persistent atrial fibrillation (HCC)    Seasonal allergies    Skin cancer    Thrombocytopenia ([place]) 2011   Very mild with platelets of 135,000.   Tinnitus    Umbilical hernia    Vitamin D deficiency    Past Surgical History:  Procedure Laterality Date   ATRIAL FIBRILLATION ABLATION  09/20/2018   ATRIAL FIBRILLATION ABLATION N/A  09/20/2018   Procedure: ATRIAL FIBRILLATION ABLATION;  Surgeon: Thompson Grayer, MD;  Location: [facility];  Service: Cardiovascular;  Laterality: N/A;   CARDIAC CATHETERIZATION  2002   "Normal cardiac catheterization"   CARDIOVERSION  07/07/2012   A fib - Cardioversion successful.   CARDIOVERSION N/A 06/30/2013   Procedure: CARDIOVERSION;  Surgeon: Sanda Klein, MD;  Location: [facility];  Service: Cardiovascular;  Laterality: N/A;   CARDIOVERSION N/A 08/02/2018   Procedure: CARDIOVERSION;  Surgeon: Lelon Perla, MD;  Location: [facility] [place] ENDOS[facility];  Service: Cardiovascular;  Laterality: N/A;   Carotid Duplex  12/13/12   For asymptomatic bruit. Mildly abnormal.  *BILATERAL BULB/PROXIMAL ICAs: Demonstrated a mild amount of fibrous plaque w/no evidence of significant diameter reduction, tortuosity or other vascular abnormality.    COLONOSCOPY W/ POLYPECTOMY  09/22/11   2 mm sessile cecal polyp was removed with a cold snare.   HAND SURGERY     INGUINAL HERNIA REPAIR  1991 & 2011   TEE WITHOUT CARDIOVERSION  07/07/2012   Procedure: TRANSESOPHAGEAL ECHOCARDIOGRAM (TEE);  Surgeon: Pixie Casino, MD;  Location: [facility] ENDOSCOPY;  Service: Cardiovascular;  Laterality: N/A;   TEE WITHOUT CARDIOVERSION N/A 08/02/2018   Procedure: TRANSESOPHAGEAL ECHOCARDIOGRAM (TEE);  Surgeon: Lelon Perla, MD;  Location: [place];  Service: Cardiovascular;  Laterality: N/A;    Current Outpatient Medications  Medication Sig Dispense Refill   acetaminophen (TYLENOL) 500 MG tablet Take 1,000 mg by mouth daily as needed for moderate pain or headache.     amLODipine (NORVASC) 5 MG tablet Take 5 mg by mouth daily.     Cholecalciferol (VITAMIN D3) 2000 units TABS Take 2,000 Units by mouth daily.     Cyanocobalamin (B-12) 2500 MCG SUBL Place 1 tablet under the tongue daily.     doxazosin (CARDURA) 8 MG tablet Take 1 tablet (8 mg total) by mouth at bedtime. 90 tablet 3   erythromycin ophthalmic ointment  SMARTSIG:sparingly In Eye(s) Every Night     ketorolac (ACULAR) 0.5 % ophthalmic solution 1 drop 4 (four) times daily.     Multiple Vitamins-Minerals (ICAPS) CAPS Take 1 capsule by mouth daily.      mupirocin ointment (BACTROBAN) 2 % Apply topically.     nitroGLYCERIN (NITROSTAT) 0.4 MG SL tablet PLACE 1 TABLET UNDER THE TONGUE EVERY 5 MINUTES AS NEEDED FOR CHEST PAIN 25 tablet 0   Omega-3 Fatty Acids (FISH OIL) 1000 MG CAPS Take 1,000 mg by mouth at bedtime.     Polyethyl [name] (SYSTANE OP) Place 1 drop into both eyes daily as needed (dry eyes).     polyethylene glycol (MIRALAX / GLYCOLAX) packet Take 17 g by mouth daily as needed for moderate constipation.     prednisoLONE acetate (PRED FORTE) 1 % ophthalmic suspension 1 drop daily.     rivaroxaban (XARELTO) 20 MG TABS tablet Take 1 tablet (20 mg total) by mouth daily with supper. 90 tablet 2   rOPINIRole (REQUIP) 0.25 MG tablet Take 0.25 mg by mouth at bedtime.      simvastatin (ZOCOR) 5 MG tablet Take 5 mg by mouth daily.      triamcinolone cream (KENALOG) 0.1 % Apply 1 application topically 2 (two) times daily as needed (SKIN RASHES).      triamterene-hydrochlorothiazide (MAXZIDE-25) 37.5-25 MG tablet Take 0.5 tablets by mouth daily.     No current facility-administered medications for this visit.    Allergies  Allergen Reactions   Prednisone Swelling, Other (See Comments) and Hypertension    Tachycardia   Trazodone Hcl Other (See Comments)   Doxycycline Rash    Social History   Socioeconomic History   Marital status: Widowed    Spouse name: Not on file   Number of children: 2   Years of education: Not on file   Highest education level: Not on file  Occupational History    Employer: RETIRED    Comment: Road Architect  Tobacco Use   Smoking status: Former    Types: Cigarettes    Quit date: 11/03/1963    Years since quitting: 58.8   Smokeless tobacco: Never  Vaping Use   Vaping Use: Never used   Substance and Sexual Activity   Alcohol use: No   Drug use: No   Sexual activity: Not Currently  Other Topics Concern   Not on file  Social History Narrative   Lives in [place], alone   Retired from Landscape architect   Social Determinants of Radio broadcast assistant Strain: Not on file  Food Insecurity: Not on file  Transportation Needs: Not on file  Physical Activity: Not on file  Stress: Not on file  Social Connections: Not on file  Intimate Partner Violence: Not on file     Review of Systems: All other systems reviewed and are otherwise negative except  as noted above.  Physical Exam: There were no vitals filed for this visit.   GEN- The patient is well appearing, alert and oriented x 3 today.   HEENT: normocephalic, atraumatic; sclera clear, conjunctiva pink; hearing intact; oropharynx clear; neck supple, no JVP Lymph- no cervical lymphadenopathy Lungs- Clear to ausculation bilaterally, normal work of breathing.  No wheezes, rales, rhonchi Heart- Regular rate and rhythm, no murmurs, rubs or gallops, PMI not laterally displaced GI- soft, non-tender, non-distended, bowel sounds present, no hepatosplenomegaly Extremities- no clubbing, cyanosis, or edema; DP/PT/radial pulses 2+ bilaterally MS- no significant deformity or atrophy Skin- warm and dry, no rash or lesion Psych- euthymic mood, full affect Neuro- strength and sensation are intact  EKG is ordered. Personal review of EKG from today shows NSR and PAC at 72 bpm  Additional studies reviewed include: Previous EP office notes.   Assessment and Plan:  1. Persistent atrial fibrillation mitral annual flutter S/p ablation 09/2018 Continue Xarelto for CHA2DS2-VASc of at least 3. EKG today shows PACs, which are consistent with his monitor last year Was previously on amiodarone prior to ablation Declines Echo or re-do monitor.  Re-assurance given. NYHA I symptoms. He weed eats and loads wood without SOB.  Unlikely his non specific symptoms are from ectopy. Offered update echo but he declines.   2. HTN Labile. [facility] is actively titrating medications, so I will not complicate.  Encouraged him to call them back. He states he started on lisinopril 2.5 mg daily last week and hasn't noticed a difference.   3. Malaise His complaints today are non-specific. He has no SOB, CP, or palpitations.  Will draw BMET and CBC and defer further work up to PCP.   Melida Quitter, MD  08/16/22 7:41 PM

## 2022-08-17 ENCOUNTER — Ambulatory Visit: Payer: Medicare HMO | Attending: Cardiovascular Disease | Admitting: Cardiovascular Disease

## 2022-08-17 ENCOUNTER — Encounter: Payer: Self-pay | Admitting: Cardiovascular Disease

## 2022-08-17 VITALS — BP 136/82 | HR 89 | Ht 72.0 in | Wt 199.4 lb

## 2022-08-17 DIAGNOSIS — I480 Paroxysmal atrial fibrillation: Secondary | ICD-10-CM | POA: Diagnosis not present

## 2022-08-17 DIAGNOSIS — I4819 Other persistent atrial fibrillation: Secondary | ICD-10-CM

## 2022-08-17 NOTE — Patient Instructions (Signed)
Medication Instructions:  Your physician recommends that you continue on your current medications as directed. Please refer to the Current Medication list given to you today.  *If you need a refill on your cardiac medications before your next appointment, please call your pharmacy*   Follow-Up: At [facility], you and your health needs are our priority.  As part of our continuing mission to provide you with exceptional heart care, we have created designated Provider Care Teams.  These Care Teams include your primary Cardiologist (physician) and Advanced Practice Providers (APPs -  Physician Assistants and Nurse Practitioners) who all work together to provide you with the care you need, when you need it.  Your next appointment:   1 year(s)  The format for your next appointment:   In Person  Provider:   You may see Augustus E Mealor, MD or one of the following Advanced Practice Providers on your designated Care Team:   Renee Ursuy, PA-C Michael "Andy" Tillery, PA-C    Important Information About Sugar       

## 2022-08-20 ENCOUNTER — Ambulatory Visit: Payer: Medicare HMO | Admitting: Cardiovascular Disease

## 2022-08-25 DIAGNOSIS — M25511 Pain in right shoulder: Secondary | ICD-10-CM | POA: Diagnosis not present

## 2022-08-25 DIAGNOSIS — I10 Essential (primary) hypertension: Secondary | ICD-10-CM | POA: Diagnosis not present

## 2022-08-25 DIAGNOSIS — L309 Dermatitis, unspecified: Secondary | ICD-10-CM | POA: Diagnosis not present

## 2022-09-14 DIAGNOSIS — L820 Inflamed seborrheic keratosis: Secondary | ICD-10-CM | POA: Diagnosis not present

## 2022-09-14 DIAGNOSIS — L309 Dermatitis, unspecified: Secondary | ICD-10-CM | POA: Diagnosis not present

## 2022-09-22 DIAGNOSIS — L820 Inflamed seborrheic keratosis: Secondary | ICD-10-CM | POA: Diagnosis not present

## 2022-09-23 ENCOUNTER — Ambulatory Visit: Payer: Medicare HMO | Admitting: Podiatry

## 2022-09-27 DIAGNOSIS — I700 Atherosclerosis of aorta: Secondary | ICD-10-CM | POA: Diagnosis not present

## 2022-09-27 DIAGNOSIS — I517 Cardiomegaly: Secondary | ICD-10-CM | POA: Diagnosis not present

## 2022-09-27 DIAGNOSIS — R0600 Dyspnea, unspecified: Secondary | ICD-10-CM | POA: Diagnosis not present

## 2022-09-27 DIAGNOSIS — Z79899 Other long term (current) drug therapy: Secondary | ICD-10-CM | POA: Diagnosis not present

## 2022-09-27 DIAGNOSIS — J210 Acute bronchiolitis due to respiratory syncytial virus: Secondary | ICD-10-CM | POA: Diagnosis not present

## 2022-09-27 DIAGNOSIS — R0602 Shortness of breath: Secondary | ICD-10-CM | POA: Diagnosis not present

## 2022-09-27 DIAGNOSIS — Z87891 Personal history of nicotine dependence: Secondary | ICD-10-CM | POA: Diagnosis not present

## 2022-09-29 ENCOUNTER — Ambulatory Visit: Payer: Medicare HMO | Admitting: Internal Medicine

## 2022-10-01 DIAGNOSIS — E785 Hyperlipidemia, unspecified: Secondary | ICD-10-CM | POA: Diagnosis not present

## 2022-10-01 DIAGNOSIS — I10 Essential (primary) hypertension: Secondary | ICD-10-CM | POA: Diagnosis not present

## 2022-10-01 DIAGNOSIS — I4891 Unspecified atrial fibrillation: Secondary | ICD-10-CM | POA: Diagnosis not present

## 2022-10-08 ENCOUNTER — Telehealth: Payer: Self-pay | Admitting: Cardiovascular Disease

## 2022-10-08 NOTE — Telephone Encounter (Signed)
Patient c/o Palpitations:  High priority if patient c/o lightheadedness, shortness of breath, or chest pain  How long have you had palpitations/irregular HR/ Afib? Are you having the symptoms now? Afib started about 2 weeks ago   Are you currently experiencing lightheadedness, SOB or CP? No   Do you have a history of afib (atrial fibrillation) or irregular heart rhythm? Yes  Have you checked your BP or HR? (document readings if available): HR Ranging normally in the 80's has gotten up to 125  Are you experiencing any other symptoms? No     States heart is out of rhythm and is requesting to see Dr. Myles Gip regarding it.

## 2022-10-08 NOTE — Telephone Encounter (Signed)
Patient states that his heart has been out of rhythm for the past couple of weeks. He states that he has just gotten over RSV. He reports heart rates normally in the 80s but has gotten up to 125. He reports that he can feel his heart beating irregularly and his BP cuff is also showing irregular rhythm. He has an appointment next week for evaluation.

## 2022-10-13 ENCOUNTER — Ambulatory Visit: Payer: Medicare HMO | Attending: Cardiovascular Disease | Admitting: Cardiovascular Disease

## 2022-10-13 ENCOUNTER — Encounter: Payer: Self-pay | Admitting: Cardiovascular Disease

## 2022-10-13 VITALS — BP 136/80 | HR 75 | Ht 72.0 in | Wt 193.4 lb

## 2022-10-13 DIAGNOSIS — I4891 Unspecified atrial fibrillation: Secondary | ICD-10-CM

## 2022-10-13 NOTE — Patient Instructions (Signed)
Medication Instructions:  Your physician recommends that you continue on your current medications as directed. Please refer to the Current Medication list given to you today.  *If you need a refill on your cardiac medications before your next appointment, please call your pharmacy*   Follow-Up: At [facility], you and your health needs are our priority.  As part of our continuing mission to provide you with exceptional heart care, we have created designated Provider Care Teams.  These Care Teams include your primary Cardiologist (physician) and Advanced Practice Providers (APPs -  Physician Assistants and Nurse Practitioners) who all work together to provide you with the care you need, when you need it.   Your next appointment:   6 month(s)  The format for your next appointment:   In Person  Provider:   You may see Augustus E Mealor, MD or one of the following Advanced Practice Providers on your designated Care Team:   Renee Ursuy, PA-C Michael "Andy" Tillery, PA-C    Important Information About Sugar       

## 2022-10-13 NOTE — Progress Notes (Signed)
PCP:  Deland Pretty, MD Primary Cardiologist: Quay Burow, MD Electrophysiologist: Melida Quitter, MD   Stanley Waters is a 87 y.o. male seen today for follow-up regarding AF.  He had a transient episode of chest pain a few weeks ago.  It did not improve immediately with nitroglycerin.  The symptoms resolved abruptly prior to EMS arrival.  The symptoms were very distinct from his atrial fibrillation symptoms which include palpitations, fatigue, and weakness. He has not had any palpitations recently.  He returns today after having an AF exacerbation in the setting of RSV pneumonia. He had a severe cough, fevers, and could not sleep for about 48 hours. He sought treatment at the ER. In the wake of this respiratory infection, he noticed irregular palpitations and a slight increase in his heart rate with readings running predominantly 80-100 bpm. There was one outlier with HR of 125 bpm.  Past Medical History:  Diagnosis Date   Atrial flutter ([place])    Atypical chest pain    Myoview performed 07/23/11 was completely normal. Post stress EF 61%.   Bruit    Left asymptomatic bruit. Carotid Duplex 12/13/12 =mildly abnormal. *BILATERAL BULB/PROXIMAL ICAs: Demonstrated a mild amount of fibrous plaque w/no evidence od significant diameter reduction, tortuosity or other vascular abnormality.   Enlarged prostate    BPH - PSA of 6.7 this is consistant with his last PSA of 6.3   Hyperlipemia    Hypertension    Inguinal hernia    Inguinal hernia repair (x) 2 1991, 2011   Intestinal polyps 09/22/11   Colonoscopy removed a 2 mm sessile cecal polyp with a cold snare.   Measles    Mumps    Normal coronary arteries 2002   Peripheral neuropathy    Persistent atrial fibrillation (HCC)    Seasonal allergies    Skin cancer    Thrombocytopenia ([place]) 2011   Very mild with platelets of 135,000.   Tinnitus    Umbilical hernia    Vitamin D deficiency    Past Surgical History:  Procedure Laterality  Date   ATRIAL FIBRILLATION ABLATION  09/20/2018   ATRIAL FIBRILLATION ABLATION N/A 09/20/2018   Procedure: ATRIAL FIBRILLATION ABLATION;  Surgeon: Thompson Grayer, MD;  Location: [facility];  Service: Cardiovascular;  Laterality: N/A;   CARDIAC CATHETERIZATION  2002   "Normal cardiac catheterization"   CARDIOVERSION  07/07/2012   A fib - Cardioversion successful.   CARDIOVERSION N/A 06/30/2013   Procedure: CARDIOVERSION;  Surgeon: Sanda Klein, MD;  Location: [facility];  Service: Cardiovascular;  Laterality: N/A;   CARDIOVERSION N/A 08/02/2018   Procedure: CARDIOVERSION;  Surgeon: Lelon Perla, MD;  Location: [facility] ENDOSCOPY;  Service: Cardiovascular;  Laterality: N/A;   Carotid Duplex  12/13/12   For asymptomatic bruit. Mildly abnormal.  *BILATERAL BULB/PROXIMAL ICAs: Demonstrated a mild amount of fibrous plaque w/no evidence of significant diameter reduction, tortuosity or other vascular abnormality.    COLONOSCOPY W/ POLYPECTOMY  09/22/11   2 mm sessile cecal polyp was removed with a cold snare.   HAND SURGERY     INGUINAL HERNIA REPAIR  1991 & 2011   TEE WITHOUT CARDIOVERSION  07/07/2012   Procedure: TRANSESOPHAGEAL ECHOCARDIOGRAM (TEE);  Surgeon: Pixie Casino, MD;  Location: [facility] ENDOSCOPY;  Service: Cardiovascular;  Laterality: N/A;   TEE WITHOUT CARDIOVERSION N/A 08/02/2018   Procedure: TRANSESOPHAGEAL ECHOCARDIOGRAM (TEE);  Surgeon: Lelon Perla, MD;  Location: [facility] ENDOSCOPY;  Service: Cardiovascular;  Laterality: N/A;    Current Outpatient  Medications  Medication Sig Dispense Refill   acetaminophen (TYLENOL) 500 MG tablet Take 1,000 mg by mouth daily as needed for moderate pain or headache.     amLODipine (NORVASC) 5 MG tablet Take 5 mg by mouth daily.     Cholecalciferol (VITAMIN D3) 2000 units TABS Take 2,000 Units by mouth daily.     Cyanocobalamin (B-12) 2500 MCG SUBL Place 1 tablet under the tongue daily.     doxazosin (CARDURA) 8 MG tablet Take 1 tablet (8 mg total)  by mouth at bedtime. 90 tablet 3   erythromycin ophthalmic ointment SMARTSIG:sparingly In Eye(s) Every Night     ketorolac (ACULAR) 0.5 % ophthalmic solution 1 drop 4 (four) times daily.     Multiple Vitamins-Minerals (ICAPS) CAPS Take 1 capsule by mouth daily.      mupirocin ointment (BACTROBAN) 2 % Apply topically.     nitroGLYCERIN (NITROSTAT) 0.4 MG SL tablet PLACE 1 TABLET UNDER THE TONGUE EVERY 5 MINUTES AS NEEDED FOR CHEST PAIN 25 tablet 0   Omega-3 Fatty Acids (FISH OIL) 1000 MG CAPS Take 1,000 mg by mouth at bedtime.     Polyethyl [name] (SYSTANE OP) Place 1 drop into both eyes daily as needed (dry eyes).     polyethylene glycol (MIRALAX / GLYCOLAX) packet Take 17 g by mouth daily as needed for moderate constipation.     prednisoLONE acetate (PRED FORTE) 1 % ophthalmic suspension 1 drop daily.     rivaroxaban (XARELTO) 20 MG TABS tablet Take 1 tablet (20 mg total) by mouth daily with supper. 90 tablet 2   rOPINIRole (REQUIP) 0.25 MG tablet Take 0.25 mg by mouth at bedtime.      simvastatin (ZOCOR) 5 MG tablet Take 5 mg by mouth daily.      triamcinolone cream (KENALOG) 0.1 % Apply 1 application topically 2 (two) times daily as needed (SKIN RASHES).      triamterene-hydrochlorothiazide (MAXZIDE-25) 37.5-25 MG tablet Take 0.5 tablets by mouth daily.     No current facility-administered medications for this visit.    Allergies  Allergen Reactions   Prednisone Swelling, Other (See Comments) and Hypertension    Tachycardia   Trazodone Hcl Other (See Comments)   Doxycycline Rash    Social History   Socioeconomic History   Marital status: Widowed    Spouse name: Not on file   Number of children: 2   Years of education: Not on file   Highest education level: Not on file  Occupational History    Employer: RETIRED    Comment: Road Architect  Tobacco Use   Smoking status: Former    Types: Cigarettes    Quit date: 11/03/1963    Years since quitting: 58.9    Smokeless tobacco: Never  Vaping Use   Vaping Use: Never used  Substance and Sexual Activity   Alcohol use: No   Drug use: No   Sexual activity: Not Currently  Other Topics Concern   Not on file  Social History Narrative   Lives in [place], alone   Retired from Landscape architect   Social Determinants of Radio broadcast assistant Strain: Not on file  Food Insecurity: Not on file  Transportation Needs: Not on file  Physical Activity: Not on file  Stress: Not on file  Social Connections: Not on file  Intimate Partner Violence: Not on file     Review of Systems: All other systems reviewed and are otherwise negative except as noted above.  Physical Exam: Vitals:  10/13/22 1552  BP: 136/80  Pulse: 75  SpO2: 99%  Weight: 193 lb 6.4 oz (87.7 kg)  Height: 6' (1.829 m)     GEN- The patient is well appearing, alert and oriented x 3 today.   HEENT: normocephalic, atraumatic; sclera clear, conjunctiva pink; hearing intact; oropharynx clear; neck supple, no JVP Lymph- no cervical lymphadenopathy Lungs- Clear to ausculation bilaterally, normal work of breathing.  No wheezes, rales, rhonchi Heart- Regular rate and rhythm, no murmurs, rubs or gallops, PMI not laterally displaced GI- soft, non-tender, non-distended, bowel sounds present, no hepatosplenomegaly Extremities- no clubbing, cyanosis, or edema; DP/PT/radial pulses 2+ bilaterally MS- no significant deformity or atrophy Skin- warm and dry, no rash or lesion Psych- euthymic mood, full affect Neuro- strength and sensation are intact  EKG is ordered. Personal review of EKG from today shows NSR and PAC at 72 bpm  Additional studies reviewed include: Previous EP office notes.   Assessment and Plan:  1. Paroxysmal atrial fibrillation mitral annual flutter S/p ablation 09/2018 - had one recurrence recently in the setting of RSV infection. Will continue to monitor. If he has recurrence, Tikosyn will probably be the best  option Continue Xarelto for CHA2DS2-VASc of at least 3. EKG today shows frequent  PACs, which are consistent with his monitor last year Was previously on amiodarone prior to ablation Re-assurance given. NYHA I symptoms. He "weed eats" and loads wood without SOB. Unlikely his non specific symptoms are from ectopy.   2. HTN Home Bps look good today, typically 120's/70's. I am not going to make any changes.    Melida Quitter, MD  10/13/22 4:35 PM

## 2022-10-20 IMAGING — CT CT ABDOMEN W/O CM
2 of 4 series · 12 of 46 positions shown, 14 images · non-contrast
Comparison: CT Abdomen and Pelvis 02/13/2021 and earlier.

CLINICAL DATA: 87-year-old male with thrombocytopenia. History of
hernia repair.

EXAM:
CT ABDOMEN WITHOUT CONTRAST
TECHNIQUE: Multidetector CT imaging of the abdomen was performed following the
standard protocol without IV contrast.
RADIATION DOSE REDUCTION: This exam was performed according to the
departmental dose-optimization program which includes automated
exposure control, adjustment of the mA and/or kV according to
patient size and/or use of iterative reconstruction technique.

[Series 2: abd without 5.00 br40 s3 axial · axial · non-contrast · 0.82mm/px · z∈[+1369,+1629]mm · 9 of 66 slices shown, 11 images]
[im 7/66  soft-tissue]
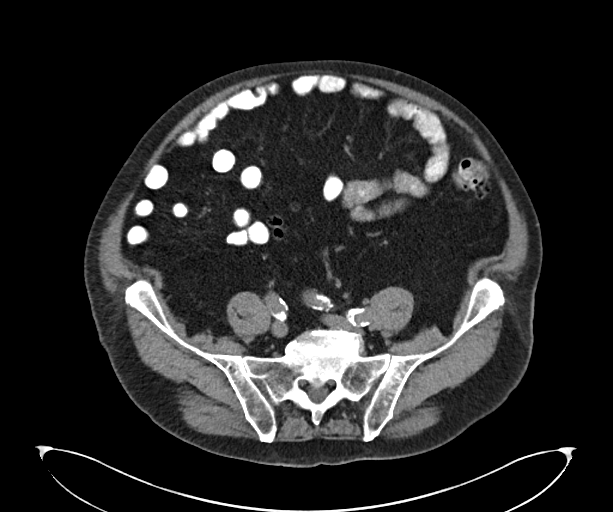
[im 7/66  bone]
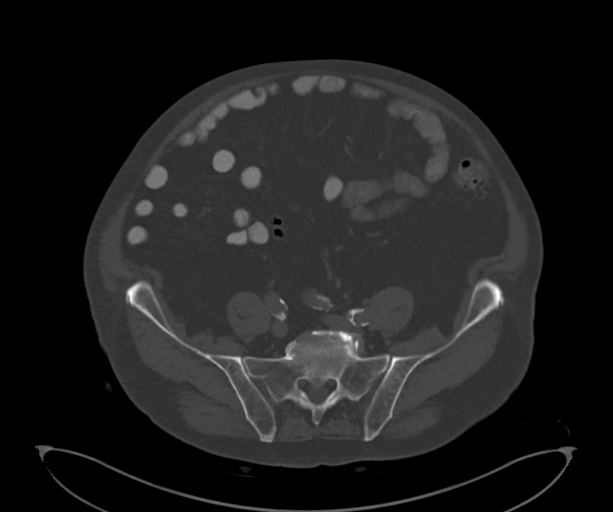
[im 14/66  soft-tissue]
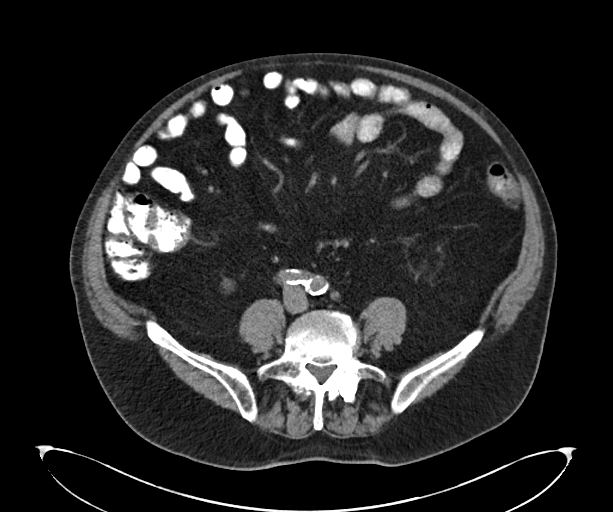
[im 20/66  soft-tissue]
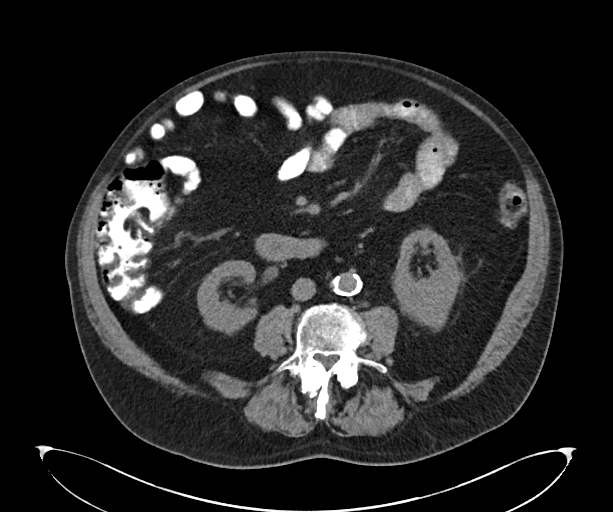
[im 27/66  soft-tissue]
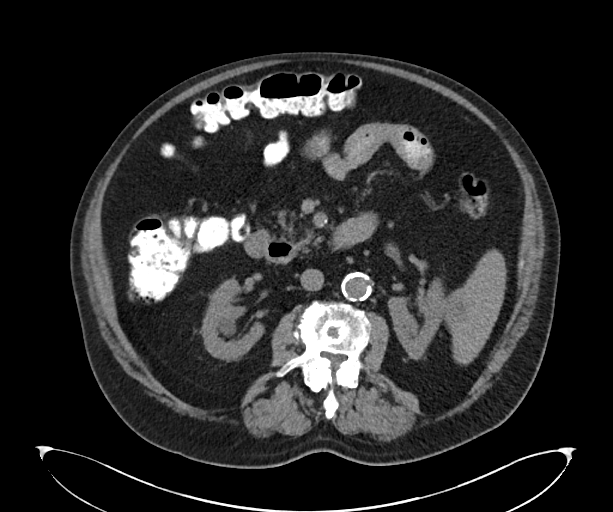
[im 33/66  soft-tissue]
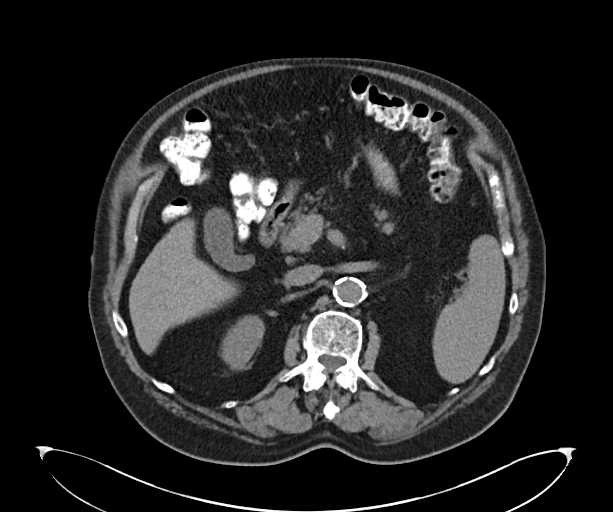
[im 40/66  soft-tissue]
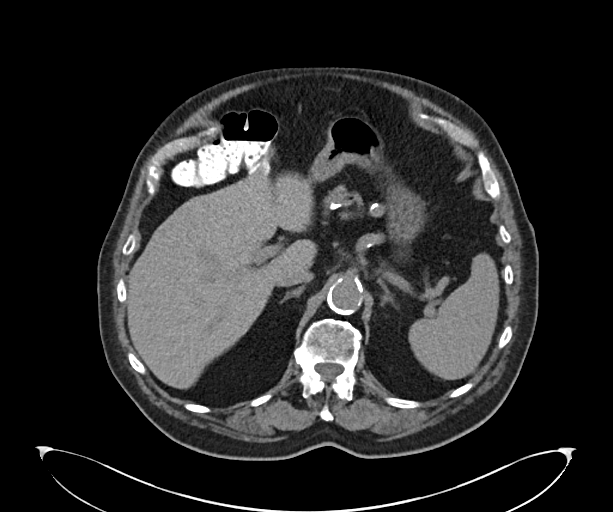
[im 46/66  soft-tissue]
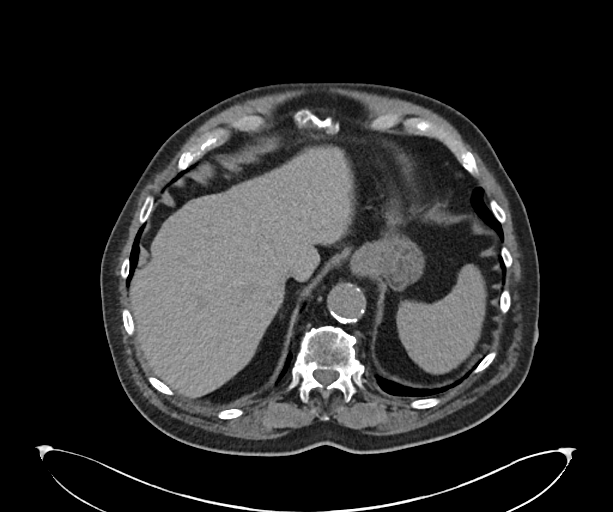
[im 53/66  soft-tissue]
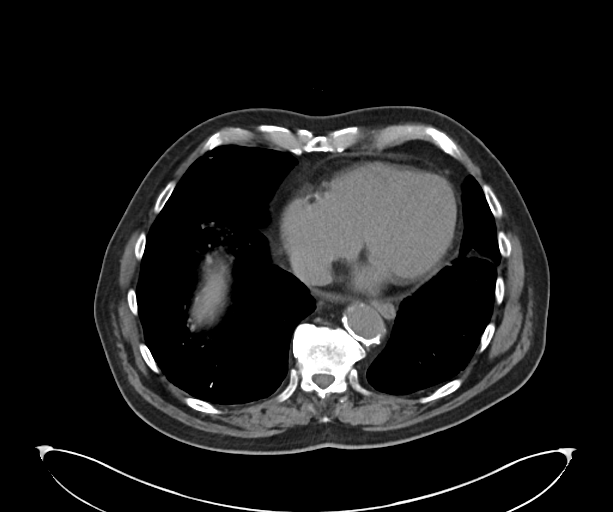
[im 59/66  soft-tissue]
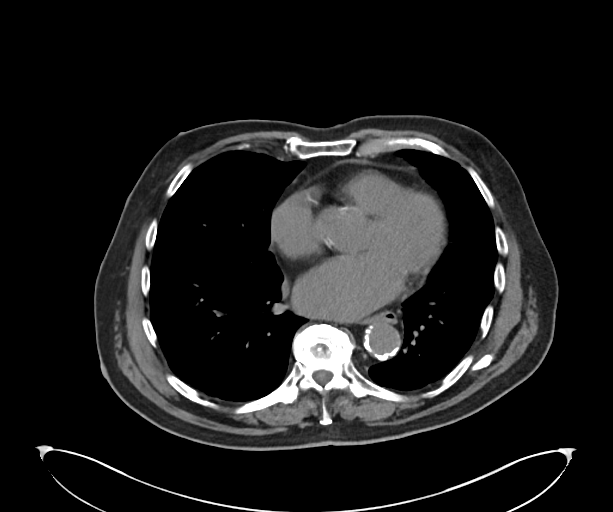
[im 59/66  bone]
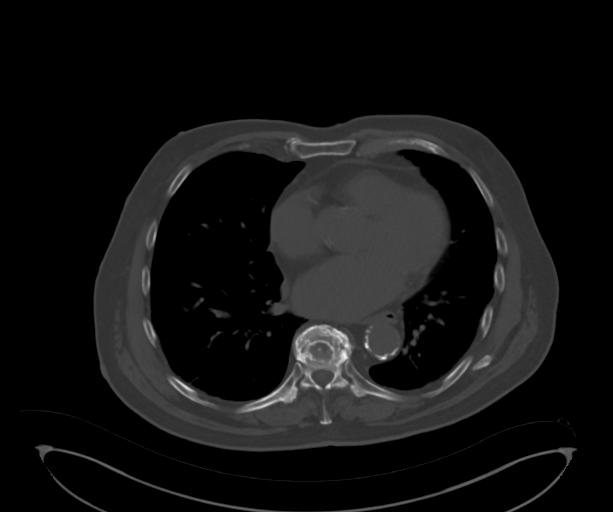

[Series 8: abd without 2.00 br40 s3 cor · coronal · non-contrast · 0.65mm/px · 3 of 224 slices shown]
[im 75/224  soft-tissue]
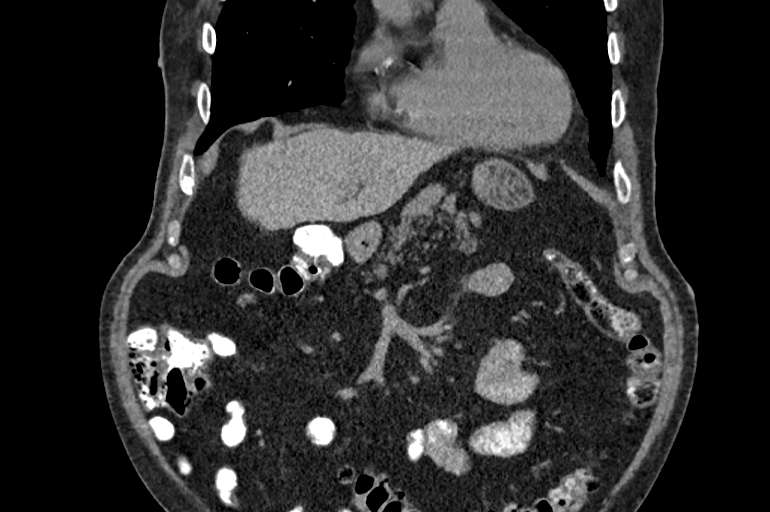
[im 100/224  soft-tissue]
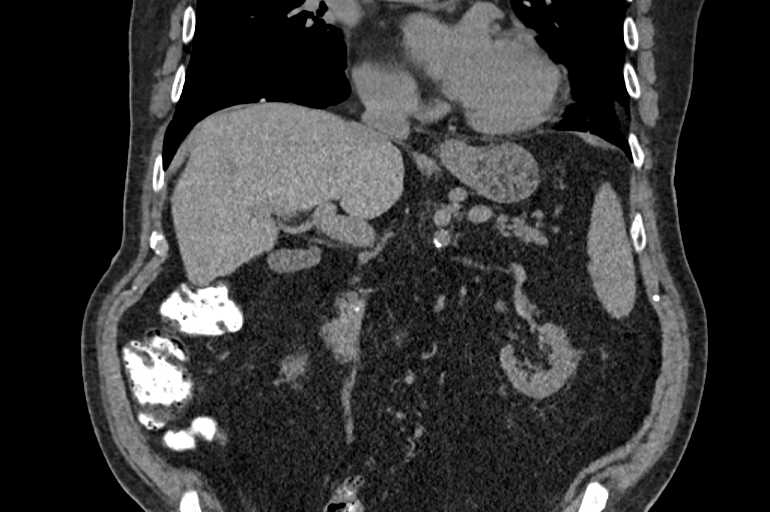
[im 124/224  soft-tissue]
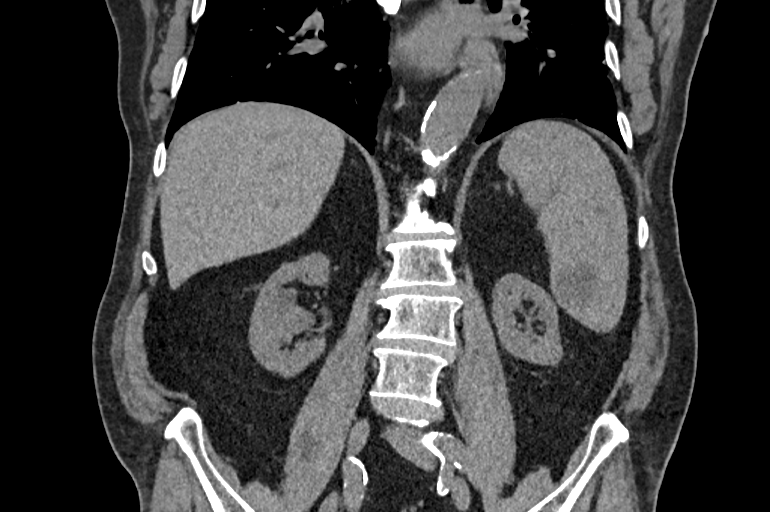

[12 of 46 positions shown; findings below may reference images not displayed]

FINDINGS: Lower chest: Chronic postinflammatory lower lung scarring appears
stable since last year. Mild CardioMEMS *CRASH* that borderline to
mild cardiomegaly. Calcified coronary artery and aortic
atherosclerosis. No pericardial or pleural effusion.

Hepatobiliary: Liver and gallbladder are stable and within normal
limits.

Pancreas: Chronic fatty atrophy but otherwise negative.

Spleen: Low-density area in the medial spleen is stable and size but
more isodense and indistinct compared to last year when the lesion
has simple fluid density. See series 2, image 38 and compare to
series 2, image 31. And a similar transformation has occurred in a
smaller more superior splenic lesion which has been present since a
3445 CT (series 2, image 28 today). Chronic mild splenomegaly.
Estimated overall splenic volume 573 mL (normal splenic volume range
83 - 412 mL) stable since last year. No perisplenic fluid or
inflammation.

Adrenals/Urinary Tract: Normal adrenal glands. Nonobstructed
kidneys. Right side column of burr 10 redemonstrated and stable. No
nephrolithiasis. No hydroureter or acute pararenal inflammation.
Bladder not included.

Stomach/Bowel: Chronic severe diverticulosis of the distal
descending and sigmoid colon partially visible. No visible active
inflammation. Oral contrast has reached the splenic flexure. Visible
large and small bowel loops are nondilated. Decompressed stomach.
Chronic duodenal diverticulum is stable on series 2, image 39 with
no active inflammation.

Vascular/Lymphatic: Extensive Aortoiliac calcified atherosclerosis.
Normal caliber visible abdominal aorta.

No lymphadenopathy in the abdomen.

Other: No free air or free fluid in the abdomen.

Musculoskeletal: Flowing endplate osteophytes in the lower thoracic
spine resulting in ankylosis through to the upper lumbar spine.
Prominent chronic mid lumbar degenerative Schmorl's node. Moderate
to severe lower lumbar facet degeneration. No acute or suspicious
osseous lesion.
IMPRESSION: 1. Mild splenomegaly is nonspecific but stable since last year.
However, two small (3 cm or less) splenic lesions no longer resemble
simple cysts as they did by CT last year. But there is no splenic
inflammation. And no lymphadenopathy in the abdomen.
Still, thrombocytopenia due to splenic sequestration or a
lymphoproliferative disorder isolated to the spleen is difficult to
exclude.

2. No other acute or inflammatory process in the non-contrast
abdomen. Chronic severe diverticulosis of the distal colon.

3. Calcified coronary artery and Aortic Atherosclerosis
([id number]).

## 2022-10-23 DIAGNOSIS — Z87891 Personal history of nicotine dependence: Secondary | ICD-10-CM | POA: Diagnosis not present

## 2022-10-23 DIAGNOSIS — J1000 Influenza due to other identified influenza virus with unspecified type of pneumonia: Secondary | ICD-10-CM | POA: Diagnosis not present

## 2022-10-23 DIAGNOSIS — I4891 Unspecified atrial fibrillation: Secondary | ICD-10-CM | POA: Diagnosis not present

## 2022-10-23 DIAGNOSIS — J189 Pneumonia, unspecified organism: Secondary | ICD-10-CM | POA: Diagnosis not present

## 2022-10-23 DIAGNOSIS — J101 Influenza due to other identified influenza virus with other respiratory manifestations: Secondary | ICD-10-CM | POA: Diagnosis not present

## 2022-10-23 DIAGNOSIS — R0602 Shortness of breath: Secondary | ICD-10-CM | POA: Diagnosis not present

## 2022-10-23 DIAGNOSIS — R062 Wheezing: Secondary | ICD-10-CM | POA: Diagnosis not present

## 2022-10-23 DIAGNOSIS — J168 Pneumonia due to other specified infectious organisms: Secondary | ICD-10-CM | POA: Diagnosis not present

## 2022-10-23 DIAGNOSIS — Z79899 Other long term (current) drug therapy: Secondary | ICD-10-CM | POA: Diagnosis not present

## 2022-10-23 DIAGNOSIS — Z1152 Encounter for screening for COVID-19: Secondary | ICD-10-CM | POA: Diagnosis not present

## 2022-10-30 DIAGNOSIS — Z09 Encounter for follow-up examination after completed treatment for conditions other than malignant neoplasm: Secondary | ICD-10-CM | POA: Diagnosis not present

## 2022-10-30 DIAGNOSIS — J101 Influenza due to other identified influenza virus with other respiratory manifestations: Secondary | ICD-10-CM | POA: Diagnosis not present

## 2022-10-30 DIAGNOSIS — N3281 Overactive bladder: Secondary | ICD-10-CM | POA: Diagnosis not present

## 2022-10-30 DIAGNOSIS — J189 Pneumonia, unspecified organism: Secondary | ICD-10-CM | POA: Diagnosis not present

## 2022-11-01 DIAGNOSIS — I10 Essential (primary) hypertension: Secondary | ICD-10-CM | POA: Diagnosis not present

## 2022-11-01 DIAGNOSIS — I4891 Unspecified atrial fibrillation: Secondary | ICD-10-CM | POA: Diagnosis not present

## 2022-11-01 DIAGNOSIS — E785 Hyperlipidemia, unspecified: Secondary | ICD-10-CM | POA: Diagnosis not present

## 2022-11-06 ENCOUNTER — Telehealth: Payer: Self-pay | Admitting: Cardiovascular Disease

## 2022-11-06 NOTE — Telephone Encounter (Signed)
Patient c/o Palpitations:  High priority if patient c/o lightheadedness, shortness of breath, or chest pain  How long have you had palpitations/irregular HR/ Afib? Are you having the symptoms now? Few days Are you currently experiencing lightheadedness, SOB or CP? No   Do you have a history of afib (atrial fibrillation) or irregular heart rhythm?   Have you checked your BP or HR? (document readings if available):  Hr this morning - 49  About an hour ago - 85   Are you experiencing any other symptoms? Pt called asking to see Dr. [name] because he feels like his heart is out of rhythm. Did not schedule an appt because unsure if this is an EP issue or not. Please advise.

## 2022-11-06 NOTE — Telephone Encounter (Signed)
Spoke with pt, he is very sure he is back out of rhythm. He reports he feels fine. Aware according to the last EP note if he has reoccurrence he would need tikosyn. Patient says he is aware of that. Offered the patient an appointment for the atrial fib clinic but he refused, he did not like them. Also offered for the patient to see the EP PA and he refused and asked for an appointment with dr berry. Follow up scheduled with dr berry 11/10/22.

## 2022-11-10 ENCOUNTER — Ambulatory Visit: Payer: Medicare HMO | Attending: Cardiovascular Disease | Admitting: Cardiovascular Disease

## 2022-11-10 ENCOUNTER — Encounter: Payer: Self-pay | Admitting: Cardiovascular Disease

## 2022-11-10 VITALS — BP 138/88 | HR 106 | Wt 197.6 lb

## 2022-11-10 DIAGNOSIS — I480 Paroxysmal atrial fibrillation: Secondary | ICD-10-CM

## 2022-11-10 DIAGNOSIS — I10 Essential (primary) hypertension: Secondary | ICD-10-CM

## 2022-11-10 DIAGNOSIS — E782 Mixed hyperlipidemia: Secondary | ICD-10-CM

## 2022-11-10 NOTE — Progress Notes (Unsigned)
Medication samples have been provided to the patient.  Drug name: Xarelto '20mg'$  Qty: 3 bottles LOT: [id number] Exp.Date: 08/2024  Samples left at front desk for patient pick-up. Patient notified.

## 2022-11-10 NOTE — Patient Instructions (Signed)
Medication Instructions:  Your physician recommends that you continue on your current medications as directed. Please refer to the Current Medication list given to you today.  *If you need a refill on your cardiac medications before your next appointment, please call your pharmacy*   Follow-Up: At [facility], you and your health needs are our priority.  As part of our continuing mission to provide you with exceptional heart care, we have created designated Provider Care Teams.  These Care Teams include your primary Cardiologist (physician) and Advanced Practice Providers (APPs -  Physician Assistants and Nurse Practitioners) who all work together to provide you with the care you need, when you need it.  We recommend signing up for the patient portal called "MyChart".  Sign up information is provided on this After Visit Summary.  MyChart is used to connect with patients for Virtual Visits (Telemedicine).  Patients are able to view lab/test results, encounter notes, upcoming appointments, etc.  Non-urgent messages can be sent to your provider as well.   To learn more about what you can do with MyChart, go to [email or web address].    Your next appointment:   4 week(s)  The format for your next appointment:   In Person  Provider:   Fabian Sharp, PA-C, Sande Rives, PA-C, Caron Presume, PA-C, Jory Sims, DNP, ANP, Almyra Deforest, PA-C, or Diona Browner, NP      Then, Quay Burow, MD will plan to see you again in 6 month(s).

## 2022-11-10 NOTE — Progress Notes (Unsigned)
11/10/2022 Stanley Waters   February 20, 1935  [id number]  Primary Physician Deland Pretty, MD Primary Cardiologist: Lorretta Harp MD FACP, [place], [place], [place]  HPI:  Stanley Waters is a 88 y.o.  mildly overweight widowed Caucasian male father of 93, grandfather of 2 grandchildren who I last saw in the office 03/24/2022.Marland KitchenMarland KitchenHe has a history of normal coronary arteries by cath in 2002. He has PAF and has undergone a transesophageal echo, most recently by Dr. [name] July 07, 2012. His other problems include hypertension and hyperlipidemia. I adjusted his medications because he was bradycardic when I last saw him and changed his Eliquis to Xarelto at his request because of cost. He apparently has developed a rash since that time which he attributes to the Xarelto. He was admitted to the hospital on 06/29/13 with A. Fib with RVR. Ultimately underwent bedside DC cardioversion by Dr. [name] successfully to sinus rhythm after being switched from Rythmol to amiodarone. Since discharge he had multiple episodes of right true PAF responsive to when necessary supplemental metoprolol. He wore an event monitor for 2 weeks but did show episodes of bradycardia.since I saw him last in September he has had no episodes of atrial fibrillation.  His metoprolol was discontinued as was his verapamil. He has had heart rates in the 40s and is symptomatic from this with dizziness. He had a stress test performed 07/18/15 which is low risk. Because of labile hypertension and bradycardia he saw Almyra Deforest [facility]  in the office 08/14/16. Event monitor showed sinus rhythm with several episodes of sinus bradycardia in the 30s and 40s although he is for the most part asymptomatic from these.     I did refer him to Dr. Rayann Heman for consideration of A. fib ablation which occurred on 09/20/2018 successfully.  Dr. Rayann Heman saw him back on 03/31/2019 at which time he was maintaining sinus rhythm and feeling clinically improved on Xarelto.    He  did test positive for COVID-19 10/26/2019 and had 2 weeks of fever and diffuse body aches which have since subsided.    He did have a COVID-vaccine prior to me seeing him a year ago..  After the first dose he developed night responsive chest pain and episodes of PAF and had recurrent episodes of PAF after the second dose.  He did see Rosaria Ferries, PA-C in the office 02/07/2020 which time he was in sinus rhythm.  He said no recurrent A. fib since that time.  He had an event monitor that showed frequent PACs and PVCs, short runs of SVT and NSVT on 02/10/2020 and a 2D echo performed on 02/22/2020 that was essentially normal except he did have a thoracic aorta measuring 43 mm.   Since I saw him in the office 6 months ago he did see Dr. Myles Gip in the office 10/13/2022 at which time he was in sinus rhythm with PACs.  He had just had RSV and subsequent influenza.  He denies chest pain but does have some dyspnea.  He is in A-fib with a ventricular sponsor 106 today on Xarelto.  Current Meds  Medication Sig   acetaminophen (TYLENOL) 500 MG tablet Take 1,000 mg by mouth daily as needed for moderate pain or headache.   amLODipine (NORVASC) 5 MG tablet Take 5 mg by mouth daily.   Cholecalciferol (VITAMIN D3) 2000 units TABS Take 2,000 Units by mouth daily.   Cyanocobalamin (B-12) 2500 MCG SUBL Place 1 tablet under the tongue daily.   doxazosin (  CARDURA) 8 MG tablet Take 1 tablet (8 mg total) by mouth at bedtime.   erythromycin ophthalmic ointment SMARTSIG:sparingly In Eye(s) Every Night   ketorolac (ACULAR) 0.5 % ophthalmic solution 1 drop 4 (four) times daily.   Multiple Vitamins-Minerals (ICAPS) CAPS Take 1 capsule by mouth daily.    mupirocin ointment (BACTROBAN) 2 % Apply topically.   nitroGLYCERIN (NITROSTAT) 0.4 MG SL tablet PLACE 1 TABLET UNDER THE TONGUE EVERY 5 MINUTES AS NEEDED FOR CHEST PAIN   Omega-3 Fatty Acids (FISH OIL) 1000 MG CAPS Take 1,000 mg by mouth at bedtime.   Polyethyl Glycol-Propyl  Glycol (SYSTANE OP) Place 1 drop into both eyes daily as needed (dry eyes).   polyethylene glycol (MIRALAX / GLYCOLAX) packet Take 17 g by mouth daily as needed for moderate constipation.   prednisoLONE acetate (PRED FORTE) 1 % ophthalmic suspension 1 drop daily.   predniSONE (DELTASONE) 10 MG tablet Take 10 mg by mouth daily.   rivaroxaban (XARELTO) 20 MG TABS tablet Take 1 tablet (20 mg total) by mouth daily with supper.   rOPINIRole (REQUIP) 0.25 MG tablet Take 0.25 mg by mouth at bedtime.    simvastatin (ZOCOR) 5 MG tablet Take 5 mg by mouth daily.    triamcinolone cream (KENALOG) 0.1 % Apply 1 application topically 2 (two) times daily as needed (SKIN RASHES).      Allergies  Allergen Reactions   Prednisone Swelling, Other (See Comments) and Hypertension    Tachycardia   Trazodone Hcl Other (See Comments)   Doxycycline Rash    Social History   Socioeconomic History   Marital status: Widowed    Spouse name: Not on file   Number of children: 2   Years of education: Not on file   Highest education level: Not on file  Occupational History    Employer: RETIRED    Comment: Road Architect  Tobacco Use   Smoking status: Former    Types: Cigarettes    Quit date: 11/03/1963    Years since quitting: 59.0   Smokeless tobacco: Never  Vaping Use   Vaping Use: Never used  Substance and Sexual Activity   Alcohol use: No   Drug use: No   Sexual activity: Not Currently  Other Topics Concern   Not on file  Social History Narrative   Lives in [place], alone   Retired from Landscape architect   Social Determinants of Radio broadcast assistant Strain: Not on file  Food Insecurity: Not on file  Transportation Needs: Not on file  Physical Activity: Not on file  Stress: Not on file  Social Connections: Not on file  Intimate Partner Violence: Not on file     Review of Systems: General: negative for chills, fever, night sweats or weight changes.  Cardiovascular: negative for  chest pain, dyspnea on exertion, edema, orthopnea, palpitations, paroxysmal nocturnal dyspnea or shortness of breath Dermatological: negative for rash Respiratory: negative for cough or wheezing Urologic: negative for hematuria Abdominal: negative for nausea, vomiting, diarrhea, bright red blood per rectum, melena, or hematemesis Neurologic: negative for visual changes, syncope, or dizziness All other systems reviewed and are otherwise negative except as noted above.    Blood pressure 138/88, pulse (!) 106, weight 197 lb 9.6 oz (89.6 kg), SpO2 99 %.  General appearance: alert and no distress Neck: no adenopathy, no carotid bruit, no JVD, supple, symmetrical, trachea midline, and thyroid not enlarged, symmetric, no tenderness/mass/nodules Lungs: clear to auscultation bilaterally Heart: irregularly irregular rhythm Extremities: extremities normal, atraumatic,  no cyanosis or edema Pulses: 2+ and symmetric Skin: Skin color, texture, turgor normal. No rashes or lesions Neurologic: Grossly normal  EKG atrial fibrillation with a ventricular sponsor 106, and occasional aberrantly conducted beats.  I personally reviewed this EKG.  ASSESSMENT AND PLAN:   Normal coronary arteries 2002 History of normal coronary arteries by cardiac catheterization with which I performed in 2002.  Paroxysmal atrial fibrillation (HCC) History of PAF status post ablation by Dr. Rayann Heman 09/21/2018.  He did have some recurrence in the setting of RSV.  He saw Dr. Myles Gip  in the office 10/13/2022 which time he was in sinus rhythm with PACs.  Today he is in atrial fibrillation on Xarelto.  I suspect this was exacerbated by his recent RSV infection with subsequent influenza.  He can tell that he is in A-fib.  Dr. Myles Gip mentioned Tikosyn when he saw him last.  Hopefully he will convert back to sinus rhythm spontaneously.  I will have him see an APP in 4 weeks.  If he still in A-fib I will refer him back to Dr. Myles Gip  for  discussion regarding antiarrhythmic medication versus cardioversion.  Hyperlipidemia History of hyperlipidemia on statin therapy with lipid profile performed 03/03/2021 revealing total cholesterol of 131, LDL 63 and HDL 51.  Essential hypertension History of essential hypertension blood pressure measured today at 138/88.  He is on amlodipine and Maxide.  I am hesitant to put him on a beta-blocker since he reviewing his blood pressure and heart rate log does get heart rates in the 50s.     Lorretta Harp MD FACP,FACC,FAHA, [facility] 11/10/2022 9:59 AM

## 2022-11-10 NOTE — Assessment & Plan Note (Signed)
History of essential hypertension blood pressure measured today at 138/88.  He is on amlodipine and Maxide.  I am hesitant to put him on a beta-blocker since he reviewing his blood pressure and heart rate log does get heart rates in the 50s.

## 2022-11-10 NOTE — Assessment & Plan Note (Signed)
History of normal coronary arteries by cardiac catheterization with which I performed in 2002.

## 2022-11-10 NOTE — Assessment & Plan Note (Signed)
History of hyperlipidemia on statin therapy with lipid profile performed 03/03/2021 revealing total cholesterol of 131, LDL 63 and HDL 51.

## 2022-11-10 NOTE — Assessment & Plan Note (Signed)
History of PAF status post ablation by Dr. Rayann Heman 09/21/2018.  He did have some recurrence in the setting of RSV.  He saw Dr. Myles Gip  in the office 10/13/2022 which time he was in sinus rhythm with PACs.  Today he is in atrial fibrillation on Xarelto.  I suspect this was exacerbated by his recent RSV infection with subsequent influenza.  He can tell that he is in A-fib.  Dr. Myles Gip mentioned Tikosyn when he saw him last.  Hopefully he will convert back to sinus rhythm spontaneously.  I will have him see an APP in 4 weeks.  If he still in A-fib I will refer him back to Dr. Myles Gip  for discussion regarding antiarrhythmic medication versus cardioversion.

## 2022-11-19 ENCOUNTER — Telehealth: Payer: Self-pay | Admitting: Cardiovascular Disease

## 2022-11-19 NOTE — Telephone Encounter (Signed)
Spoke with the patient who states that he has been in and out of Afib. He recently saw D. [name] and it was advised to wait several weeks to see if he spontaneously converts as he was recently sick with viral illness. Patient states that he would prefer to follow up with Dr. Myles Gip rather than the PA. I have scheduled the patient to see Dr. Myles Gip in a few weeks to discuss Tikosyn.

## 2022-11-19 NOTE — Telephone Encounter (Signed)
Patient c/o Palpitations:  High priority if patient c/o lightheadedness, shortness of breath, or chest pain  How long have you had palpitations/irregular HR/ Afib? Are you having the symptoms now? Afib, "been going on since the last month"   Are you currently experiencing lightheadedness, SOB or CP? No  Do you have a history of afib (atrial fibrillation) or irregular heart rhythm? No   Have you checked your BP or HR? (document readings if available): "my hr is up sometimes and then back down, checked this morning and it was 67 and back up to 95"   Are you experiencing any other symptoms? No

## 2022-11-20 DIAGNOSIS — L299 Pruritus, unspecified: Secondary | ICD-10-CM | POA: Diagnosis not present

## 2022-11-20 DIAGNOSIS — R059 Cough, unspecified: Secondary | ICD-10-CM | POA: Diagnosis not present

## 2022-11-30 ENCOUNTER — Other Ambulatory Visit (HOSPITAL_COMMUNITY): Payer: Self-pay | Admitting: Internal Medicine

## 2022-11-30 DIAGNOSIS — R161 Splenomegaly, not elsewhere classified: Secondary | ICD-10-CM

## 2022-12-02 DIAGNOSIS — I4891 Unspecified atrial fibrillation: Secondary | ICD-10-CM | POA: Diagnosis not present

## 2022-12-02 DIAGNOSIS — I10 Essential (primary) hypertension: Secondary | ICD-10-CM | POA: Diagnosis not present

## 2022-12-02 DIAGNOSIS — E785 Hyperlipidemia, unspecified: Secondary | ICD-10-CM | POA: Diagnosis not present

## 2022-12-08 ENCOUNTER — Ambulatory Visit: Payer: Medicare HMO | Admitting: Physician Assistant

## 2022-12-08 ENCOUNTER — Ambulatory Visit: Payer: Medicare HMO | Attending: Cardiovascular Disease | Admitting: Cardiovascular Disease

## 2022-12-08 ENCOUNTER — Encounter: Payer: Self-pay | Admitting: Cardiovascular Disease

## 2022-12-08 VITALS — BP 138/70 | HR 78 | Ht 72.0 in | Wt 203.0 lb

## 2022-12-08 DIAGNOSIS — I480 Paroxysmal atrial fibrillation: Secondary | ICD-10-CM | POA: Diagnosis not present

## 2022-12-08 NOTE — Patient Instructions (Signed)
Medication Instructions:  Your physician recommends that you continue on your current medications as directed. Please refer to the Current Medication list given to you today.  *If you need a refill on your cardiac medications before your next appointment, please call your pharmacy*  Lab Work: None ordered  Testing/Procedures: None ordered  Follow-Up: Keep follow-up as scheduled.

## 2022-12-08 NOTE — Progress Notes (Signed)
PCP:  Deland Pretty, MD Primary Cardiologist: Quay Burow, MD Electrophysiologist: Melida Quitter, MD   Stanley Waters is a 88 y.o. male seen today for follow-up regarding AF.  He had a transient episode of chest pain a few weeks ago.  It did not improve immediately with nitroglycerin.  The symptoms resolved abruptly prior to EMS arrival.  The symptoms were very distinct from his atrial fibrillation symptoms which include palpitations, fatigue, and weakness. He has not had any palpitations recently.  He returns today after having an AF exacerbation in the setting of RSV pneumonia. He had a severe cough, fevers, and could not sleep for about 48 hours. He sought treatment at the ER. In the wake of this respiratory infection, he noticed irregular palpitations and a slight increase in his heart rate with readings running predominantly 80-100 bpm. There was one outlier with HR of 125 bpm.  12/08/2022 Since last seeing me, he was seen in clinic by Dr. [name]. He was noted to be in AF at that time and symptomatic with fatigue.  Past Medical History:  Diagnosis Date   Atrial flutter ([place])    Atypical chest pain    Myoview performed 07/23/11 was completely normal. Post stress EF 61%.   Bruit    Left asymptomatic bruit. Carotid Duplex 12/13/12 =mildly abnormal. *BILATERAL BULB/PROXIMAL ICAs: Demonstrated a mild amount of fibrous plaque w/no evidence od significant diameter reduction, tortuosity or other vascular abnormality.   Enlarged prostate    BPH - PSA of 6.7 this is consistant with his last PSA of 6.3   Hyperlipemia    Hypertension    Inguinal hernia    Inguinal hernia repair (x) 2 1991, 2011   Intestinal polyps 09/22/11   Colonoscopy removed a 2 mm sessile cecal polyp with a cold snare.   Measles    Mumps    Normal coronary arteries 2002   Peripheral neuropathy    Persistent atrial fibrillation (HCC)    Seasonal allergies    Skin cancer    Thrombocytopenia ([place]) 2011   Very  mild with platelets of 135,000.   Tinnitus    Umbilical hernia    Vitamin D deficiency    Past Surgical History:  Procedure Laterality Date   ATRIAL FIBRILLATION ABLATION  09/20/2018   ATRIAL FIBRILLATION ABLATION N/A 09/20/2018   Procedure: ATRIAL FIBRILLATION ABLATION;  Surgeon: Thompson Grayer, MD;  Location: [place] [facility];  Service: Cardiovascular;  Laterality: N/A;   CARDIAC CATHETERIZATION  2002   "Normal cardiac catheterization"   CARDIOVERSION  07/07/2012   A fib - Cardioversion successful.   CARDIOVERSION N/A 06/30/2013   Procedure: CARDIOVERSION;  Surgeon: Sanda Klein, MD;  Location: [facility];  Service: Cardiovascular;  Laterality: N/A;   CARDIOVERSION N/A 08/02/2018   Procedure: CARDIOVERSION;  Surgeon: Lelon Perla, MD;  Location: [facility] ENDOSCOPY;  Service: Cardiovascular;  Laterality: N/A;   Carotid Duplex  12/13/12   For asymptomatic bruit. Mildly abnormal.  *BILATERAL BULB/PROXIMAL ICAs: Demonstrated a mild amount of fibrous plaque w/no evidence of significant diameter reduction, tortuosity or other vascular abnormality.    COLONOSCOPY W/ POLYPECTOMY  09/22/11   2 mm sessile cecal polyp was removed with a cold snare.   HAND SURGERY     INGUINAL HERNIA REPAIR  1991 & 2011   TEE WITHOUT CARDIOVERSION  07/07/2012   Procedure: TRANSESOPHAGEAL ECHOCARDIOGRAM (TEE);  Surgeon: Pixie Casino, MD;  Location: [facility] ENDOSCOPY;  Service: Cardiovascular;  Laterality: N/A;   TEE WITHOUT CARDIOVERSION N/A  08/02/2018   Procedure: TRANSESOPHAGEAL ECHOCARDIOGRAM (TEE);  Surgeon: Lelon Perla, MD;  Location: [facility] ENDOSCOPY;  Service: Cardiovascular;  Laterality: N/A;    Current Outpatient Medications  Medication Sig Dispense Refill   acetaminophen (TYLENOL) 500 MG tablet Take 1,000 mg by mouth daily as needed for moderate pain or headache.     amLODipine (NORVASC) 5 MG tablet Take 5 mg by mouth daily.     Cholecalciferol (VITAMIN D3) 2000 units TABS Take 2,000 Units by mouth daily.      Cyanocobalamin (B-12) 2500 MCG SUBL Place 1 tablet under the tongue daily.     doxazosin (CARDURA) 8 MG tablet Take 1 tablet (8 mg total) by mouth at bedtime. 90 tablet 3   erythromycin ophthalmic ointment SMARTSIG:sparingly In Eye(s) Every Night     ketorolac (ACULAR) 0.5 % ophthalmic solution 1 drop 4 (four) times daily.     Multiple Vitamins-Minerals (ICAPS) CAPS Take 1 capsule by mouth daily.      mupirocin ointment (BACTROBAN) 2 % Apply topically.     nitroGLYCERIN (NITROSTAT) 0.4 MG SL tablet PLACE 1 TABLET UNDER THE TONGUE EVERY 5 MINUTES AS NEEDED FOR CHEST PAIN 25 tablet 0   Polyethyl [name] (SYSTANE OP) Place 1 drop into both eyes daily as needed (dry eyes).     polyethylene glycol (MIRALAX / GLYCOLAX) packet Take 17 g by mouth daily as needed for moderate constipation.     prednisoLONE acetate (PRED FORTE) 1 % ophthalmic suspension 1 drop daily.     rivaroxaban (XARELTO) 20 MG TABS tablet Take 1 tablet (20 mg total) by mouth daily with supper. 90 tablet 2   rOPINIRole (REQUIP) 0.25 MG tablet Take 0.25 mg by mouth at bedtime.      simvastatin (ZOCOR) 5 MG tablet Take 5 mg by mouth daily.      triamcinolone cream (KENALOG) 0.1 % Apply 1 application topically 2 (two) times daily as needed (SKIN RASHES).      triamterene-hydrochlorothiazide (MAXZIDE-25) 37.5-25 MG tablet Take 0.5 tablets by mouth daily.     Omega-3 Fatty Acids (FISH OIL) 1000 MG CAPS Take 1,000 mg by mouth at bedtime. (Patient not taking: Reported on 12/08/2022)     predniSONE (DELTASONE) 10 MG tablet Take 10 mg by mouth daily. (Patient not taking: Reported on 12/08/2022)     No current facility-administered medications for this visit.    Allergies  Allergen Reactions   Prednisone Swelling, Other (See Comments) and Hypertension    Tachycardia   Trazodone Hcl Other (See Comments)   Doxycycline Rash    Social History   Socioeconomic History   Marital status: Widowed    Spouse name: Not on file    Number of children: 2   Years of education: Not on file   Highest education level: Not on file  Occupational History    Employer: RETIRED    Comment: Road Architect  Tobacco Use   Smoking status: Former    Types: Cigarettes    Quit date: 11/03/1963    Years since quitting: 59.1   Smokeless tobacco: Never  Vaping Use   Vaping Use: Never used  Substance and Sexual Activity   Alcohol use: No   Drug use: No   Sexual activity: Not Currently  Other Topics Concern   Not on file  Social History Narrative   Lives in [place], alone   Retired from Landscape architect   Social Determinants of Radio broadcast assistant Strain: Not on file  Food Insecurity: Not  on file  Transportation Needs: Not on file  Physical Activity: Not on file  Stress: Not on file  Social Connections: Not on file  Intimate Partner Violence: Not on file     Review of Systems: All other systems reviewed and are otherwise negative except as noted above.  Physical Exam: Vitals:   12/08/22 1421  BP: 138/70  Pulse: 78  SpO2: 96%  Weight: 203 lb (92.1 kg)  Height: 6' (1.829 m)     Gen: Appears comfortable, well-nourished CV: RRR, no dependent edema Pulm: breathing easily   EKG is ordered. Personal review of EKG from today shows NSR   ECG 1/9 shows atrial fibrillation  Assessment and Plan:  1. Paroxysmal atrial fibrillation mitral annual flutter S/p ablation 09/2018 - with documented recurrence corresponding with symptoms. I think rhythm control is reasonable and recommended Tikosyn. I explained the process of loading the drug. He wants to check pricing and think about it. Continue Xarelto for CHA2DS2-VASc of at least 3. EKG today shows frequent  PACs, which are consistent with his monitor last year Was previously on amiodarone prior to ablation Re-assurance given. NYHA I symptoms. He "weed eats" and loads wood without SOB. Unlikely his non specific symptoms are from ectopy.   2.  HTN Controlled. I am not going to make any changes.    Melida Quitter, MD  12/08/22 2:36 PM

## 2022-12-10 ENCOUNTER — Ambulatory Visit (HOSPITAL_COMMUNITY)
Admission: RE | Admit: 2022-12-10 | Discharge: 2022-12-10 | Disposition: A | Discharge status: Home / Self Care | Payer: Medicare HMO | Source: Ambulatory Visit | Attending: Internal Medicine | Admitting: Internal Medicine

## 2022-12-10 DIAGNOSIS — I700 Atherosclerosis of aorta: Secondary | ICD-10-CM | POA: Diagnosis not present

## 2022-12-10 DIAGNOSIS — R161 Splenomegaly, not elsewhere classified: Secondary | ICD-10-CM | POA: Diagnosis not present

## 2022-12-10 DIAGNOSIS — N281 Cyst of kidney, acquired: Secondary | ICD-10-CM | POA: Diagnosis not present

## 2022-12-10 DIAGNOSIS — N261 Atrophy of kidney (terminal): Secondary | ICD-10-CM | POA: Diagnosis not present

## 2022-12-15 ENCOUNTER — Other Ambulatory Visit (HOSPITAL_COMMUNITY): Payer: Self-pay | Admitting: *Deleted

## 2022-12-15 DIAGNOSIS — I480 Paroxysmal atrial fibrillation: Secondary | ICD-10-CM | POA: Diagnosis not present

## 2022-12-15 DIAGNOSIS — R42 Dizziness and giddiness: Secondary | ICD-10-CM | POA: Diagnosis not present

## 2022-12-16 ENCOUNTER — Telehealth: Payer: Self-pay | Admitting: Cardiovascular Disease

## 2022-12-16 ENCOUNTER — Telehealth: Payer: Self-pay | Admitting: Pharmacist

## 2022-12-16 NOTE — Telephone Encounter (Signed)
Received phone call from [facility] and they are sending in a STAT for patient going in and out of afib daily, volitale BP, and constant dizzy spells

## 2022-12-16 NOTE — Telephone Encounter (Signed)
Medication list reviewed in anticipation of upcoming Tikosyn initiation. Patient is not taking any contraindicated or QTc prolonging medications.   Patient is anticoagulated on Xarelto 39m daily on the appropriate dose. Please ensure that patient has not missed any anticoagulation doses in the 3 weeks prior to Tikosyn initiation.   Patient will need to be counseled to avoid use of Benadryl while on Tikosyn and in the 2-3 days prior to Tikosyn initiation.

## 2022-12-16 NOTE — Telephone Encounter (Signed)
Patient PCP called to see if we recived referral and if was scheduled. Confirmed scheduled with a.fib clinic next week.

## 2022-12-18 ENCOUNTER — Encounter (HOSPITAL_COMMUNITY): Payer: Self-pay

## 2022-12-21 ENCOUNTER — Ambulatory Visit (HOSPITAL_COMMUNITY)
Admission: RE | Admit: 2022-12-21 | Discharge: 2022-12-21 | Disposition: A | Discharge status: Home / Self Care | Payer: Medicare HMO | Source: Ambulatory Visit | Attending: Physician Assistant | Admitting: Physician Assistant

## 2022-12-21 ENCOUNTER — Other Ambulatory Visit (HOSPITAL_COMMUNITY): Payer: Self-pay

## 2022-12-21 ENCOUNTER — Other Ambulatory Visit: Payer: Self-pay

## 2022-12-21 ENCOUNTER — Encounter (HOSPITAL_COMMUNITY): Payer: Self-pay | Admitting: Physician Assistant

## 2022-12-21 ENCOUNTER — Inpatient Hospital Stay (HOSPITAL_COMMUNITY)
Admission: RE | Admit: 2022-12-21 | Discharge: 2022-12-24 | DRG: 309 | Disposition: A | Discharge status: Home / Self Care | Payer: Medicare HMO | Source: Ambulatory Visit | Attending: Cardiovascular Disease | Admitting: Cardiovascular Disease

## 2022-12-21 VITALS — BP 146/78 | HR 68 | Ht 72.0 in | Wt 200.4 lb

## 2022-12-21 DIAGNOSIS — I480 Paroxysmal atrial fibrillation: Principal | ICD-10-CM

## 2022-12-21 DIAGNOSIS — Z888 Allergy status to other drugs, medicaments and biological substances status: Secondary | ICD-10-CM

## 2022-12-21 DIAGNOSIS — Z7901 Long term (current) use of anticoagulants: Secondary | ICD-10-CM | POA: Diagnosis not present

## 2022-12-21 DIAGNOSIS — I2510 Atherosclerotic heart disease of native coronary artery without angina pectoris: Secondary | ICD-10-CM | POA: Diagnosis present

## 2022-12-21 DIAGNOSIS — Z8249 Family history of ischemic heart disease and other diseases of the circulatory system: Secondary | ICD-10-CM

## 2022-12-21 DIAGNOSIS — D6869 Other thrombophilia: Secondary | ICD-10-CM

## 2022-12-21 DIAGNOSIS — I10 Essential (primary) hypertension: Secondary | ICD-10-CM | POA: Diagnosis not present

## 2022-12-21 DIAGNOSIS — Z85828 Personal history of other malignant neoplasm of skin: Secondary | ICD-10-CM

## 2022-12-21 DIAGNOSIS — E785 Hyperlipidemia, unspecified: Secondary | ICD-10-CM | POA: Diagnosis not present

## 2022-12-21 DIAGNOSIS — I4892 Unspecified atrial flutter: Secondary | ICD-10-CM | POA: Diagnosis not present

## 2022-12-21 DIAGNOSIS — N400 Enlarged prostate without lower urinary tract symptoms: Secondary | ICD-10-CM | POA: Diagnosis present

## 2022-12-21 DIAGNOSIS — I700 Atherosclerosis of aorta: Secondary | ICD-10-CM | POA: Diagnosis present

## 2022-12-21 DIAGNOSIS — G629 Polyneuropathy, unspecified: Secondary | ICD-10-CM | POA: Diagnosis present

## 2022-12-21 DIAGNOSIS — Z87891 Personal history of nicotine dependence: Secondary | ICD-10-CM | POA: Diagnosis not present

## 2022-12-21 LAB — BASIC METABOLIC PANEL
Anion gap: 9 (ref 5–15)
BUN: 10 mg/dL (ref 8–23)
CO2: 24 mmol/L (ref 22–32)
Calcium: 8.8 mg/dL — ABNORMAL LOW (ref 8.9–10.3)
Chloride: 101 mmol/L (ref 98–111)
Creatinine, Ser: 0.97 mg/dL (ref 0.61–1.24)
GFR, Estimated: >60 mL/min (ref >60)
Glucose, Bld: 105 mg/dL — ABNORMAL HIGH (ref 70–99)
Potassium: 3.8 mmol/L (ref 3.5–5.1)
Sodium: 134 mmol/L — ABNORMAL LOW (ref 135–145)

## 2022-12-21 LAB — MAGNESIUM: Magnesium: 2.1 mg/dL (ref 1.7–2.4)

## 2022-12-21 MED ORDER — SODIUM CHLORIDE 0.9% FLUSH: 3.0000 mL | INTRAVENOUS | Status: DC | PRN

## 2022-12-21 MED ORDER — POTASSIUM CHLORIDE CRYS ER 20 MEQ PO TBCR
40.0000 meq | EXTENDED_RELEASE_TABLET | Freq: Once | ORAL | Status: AC
  Administered 2022-12-21: 40 meq via ORAL
  Filled 2022-12-21: qty 2

## 2022-12-21 MED ORDER — POLYETHYLENE GLYCOL 3350 17 G PO PACK
17.0000 g | PACK | Freq: Every day | ORAL | Status: DC
  Administered 2022-12-22 – 2022-12-23 (×2): 17 g via ORAL
  Filled 2022-12-21 (×4): qty 1

## 2022-12-21 MED ORDER — ROPINIROLE HCL 0.5 MG PO TABS
0.2500 mg | ORAL_TABLET | Freq: Every day | ORAL | Status: DC
  Administered 2022-12-21 – 2022-12-23 (×3): 0.25 mg via ORAL
  Filled 2022-12-21 (×3): qty 1

## 2022-12-21 MED ORDER — VITAMIN D 25 MCG (1000 UNIT) PO TABS
2000.0000 [IU] | ORAL_TABLET | Freq: Every day | ORAL | Status: DC
  Administered 2022-12-22 – 2022-12-24 (×3): 2000 [IU] via ORAL
  Filled 2022-12-21 (×3): qty 2

## 2022-12-21 MED ORDER — DOFETILIDE 500 MCG PO CAPS
500.0000 ug | ORAL_CAPSULE | Freq: Two times a day (BID) | ORAL | Status: DC
  Administered 2022-12-21 – 2022-12-22 (×2): 500 ug via ORAL
  Filled 2022-12-21 (×2): qty 1

## 2022-12-21 MED ORDER — B-12 2500 MCG SL SUBL: 1.0000 | SUBLINGUAL_TABLET | Freq: Every day | SUBLINGUAL | Status: DC

## 2022-12-21 MED ORDER — SODIUM CHLORIDE 0.9% FLUSH
3.0000 mL | Freq: Two times a day (BID) | INTRAVENOUS | Status: DC
  Administered 2022-12-21 – 2022-12-24 (×7): 3 mL via INTRAVENOUS

## 2022-12-21 MED ORDER — NITROGLYCERIN 0.4 MG SL SUBL: 0.4000 mg | SUBLINGUAL_TABLET | SUBLINGUAL | Status: DC | PRN

## 2022-12-21 MED ORDER — DOXAZOSIN MESYLATE 8 MG PO TABS
8.0000 mg | ORAL_TABLET | Freq: Every day | ORAL | Status: DC
  Administered 2022-12-21 – 2022-12-23 (×3): 8 mg via ORAL
  Filled 2022-12-21 (×4): qty 1

## 2022-12-21 MED ORDER — AMLODIPINE BESYLATE 5 MG PO TABS
5.0000 mg | ORAL_TABLET | Freq: Every day | ORAL | Status: DC
  Filled 2022-12-21 (×3): qty 1

## 2022-12-21 MED ORDER — BRIMONIDINE TARTRATE 0.15 % OP SOLN: 1.0000 [drp] | Freq: Three times a day (TID) | OPHTHALMIC | Status: DC | PRN

## 2022-12-21 MED ORDER — SIMVASTATIN 5 MG PO TABS
5.0000 mg | ORAL_TABLET | Freq: Every day | ORAL | Status: DC
  Administered 2022-12-22 – 2022-12-24 (×3): 5 mg via ORAL
  Filled 2022-12-21 (×3): qty 1

## 2022-12-21 MED ORDER — SODIUM CHLORIDE 0.9 % IV SOLN: 250.0000 mL | INTRAVENOUS | Status: DC | PRN

## 2022-12-21 MED ORDER — RIVAROXABAN 20 MG PO TABS
20.0000 mg | ORAL_TABLET | Freq: Every day | ORAL | Status: DC
  Administered 2022-12-21 – 2022-12-23 (×3): 20 mg via ORAL
  Filled 2022-12-21 (×3): qty 1

## 2022-12-21 MED ORDER — ACETAMINOPHEN 500 MG PO TABS: 1000.0000 mg | ORAL_TABLET | Freq: Every day | ORAL | Status: DC | PRN

## 2022-12-21 NOTE — Progress Notes (Signed)
Mobility Specialist Progress Note:   12/21/22 1450  Mobility  Activity Ambulated independently in hallway  Level of Assistance Independent  Assistive Device None  Distance Ambulated (ft) 500 ft  Activity Response Tolerated well  Mobility Referral Yes  $Mobility charge 1 Mobility   Pt agreeable to mobility session. Required no physical assistance throughout session. HR in and out of Afib 80s-110s, pt asx. Left pt with all needs met.  Nelta Numbers Mobility Specialist Please contact via SecureChat or  Rehab office at [phone]

## 2022-12-21 NOTE — Progress Notes (Signed)
Primary Care Physician: Deland Pretty, MD Primary Cardiologist: Dr [name] Primary Electrophysiologist: Dr Myles Gip Referring Physician: Dr Myles Gip   Stanley Waters is a 88 y.o. male with a history of HTN, HLD, CAD/aortic atherosclerosis, atrial flutter, atrial fibrillation who presents for follow up in the [facility]. He is s/p ablation 09/2018. He did have recurrence in the setting of RSV infection. He had more documented episodes at his visit with Dr [name] on 11/10/22. He was seen by Dr Myles Gip who recommended dofetilide loading. Patient is on Xarelto for a CHADS2VASC score of 4.  Patient presents today for dofetilide loading. He denies any missed doses of anticoagulation in the past 3 weeks. He has been in SR for the past 5 days per patient.   Today, he denies symptoms of palpitations, chest pain, shortness of breath, orthopnea, PND, lower extremity edema, dizziness, presyncope, syncope, snoring, daytime somnolence, bleeding, or neurologic sequela. The patient is tolerating medications without difficulties and is otherwise without complaint today.    Atrial Fibrillation Risk Factors:  he does not have symptoms or diagnosis of sleep apnea. he does not have a history of rheumatic fever.   he has a BMI of Body mass index is 27.18 kg/m.[name] Filed Weights   12/21/22 0936  Weight: 90.9 kg    Family History  Problem Relation Age of Onset   Heart failure Brother     Atrial Fibrillation Management history:  Previous antiarrhythmic drugs: amiodarone  Previous cardioversions: 2013, 2014, 2019 Previous ablations: 09/2018 CHADS2VASC score: 4 Anticoagulation history: Xarelto   Past Medical History:  Diagnosis Date   Atrial flutter ([place])    Atypical chest pain    Myoview performed 07/23/11 was completely normal. Post stress EF 61%.   Bruit    Left asymptomatic bruit. Carotid Duplex 12/13/12 =mildly abnormal. *BILATERAL BULB/PROXIMAL ICAs: Demonstrated a mild  amount of fibrous plaque w/no evidence od significant diameter reduction, tortuosity or other vascular abnormality.   Enlarged prostate    BPH - PSA of 6.7 this is consistant with his last PSA of 6.3   Hyperlipemia    Hypertension    Inguinal hernia    Inguinal hernia repair (x) 2 1991, 2011   Intestinal polyps 09/22/11   Colonoscopy removed a 2 mm sessile cecal polyp with a cold snare.   Measles    Mumps    Normal coronary arteries 2002   Peripheral neuropathy    Persistent atrial fibrillation (HCC)    Seasonal allergies    Skin cancer    Thrombocytopenia ([place]) 2011   Very mild with platelets of 135,000.   Tinnitus    Umbilical hernia    Vitamin D deficiency    Past Surgical History:  Procedure Laterality Date   ATRIAL FIBRILLATION ABLATION  09/20/2018   ATRIAL FIBRILLATION ABLATION N/A 09/20/2018   Procedure: ATRIAL FIBRILLATION ABLATION;  Surgeon: Thompson Grayer, MD;  Location: [place] [facility];  Service: Cardiovascular;  Laterality: N/A;   CARDIAC CATHETERIZATION  2002   "Normal cardiac catheterization"   CARDIOVERSION  07/07/2012   A fib - Cardioversion successful.   CARDIOVERSION N/A 06/30/2013   Procedure: CARDIOVERSION;  Surgeon: Sanda Klein, MD;  Location: [facility];  Service: Cardiovascular;  Laterality: N/A;   CARDIOVERSION N/A 08/02/2018   Procedure: CARDIOVERSION;  Surgeon: Lelon Perla, MD;  Location: [facility] ENDOSCOPY;  Service: Cardiovascular;  Laterality: N/A;   Carotid Duplex  12/13/12   For asymptomatic bruit. Mildly abnormal.  *BILATERAL BULB/PROXIMAL ICAs: Demonstrated a mild  amount of fibrous plaque w/no evidence of significant diameter reduction, tortuosity or other vascular abnormality.    COLONOSCOPY W/ POLYPECTOMY  09/22/11   2 mm sessile cecal polyp was removed with a cold snare.   HAND SURGERY     INGUINAL HERNIA REPAIR  1991 & 2011   TEE WITHOUT CARDIOVERSION  07/07/2012   Procedure: TRANSESOPHAGEAL ECHOCARDIOGRAM (TEE);  Surgeon: Pixie Casino, MD;   Location: [facility] ENDOSCOPY;  Service: Cardiovascular;  Laterality: N/A;   TEE WITHOUT CARDIOVERSION N/A 08/02/2018   Procedure: TRANSESOPHAGEAL ECHOCARDIOGRAM (TEE);  Surgeon: Lelon Perla, MD;  Location: [facility] ENDOSCOPY;  Service: Cardiovascular;  Laterality: N/A;    No current outpatient medications on file.   No current facility-administered medications for this encounter.    Allergies  Allergen Reactions   Prednisone Swelling, Other (See Comments) and Hypertension    Tachycardia   Trazodone Hcl Other (See Comments)   Doxycycline Rash    Social History   Socioeconomic History   Marital status: Widowed    Spouse name: Not on file   Number of children: 2   Years of education: Not on file   Highest education level: Not on file  Occupational History    Employer: RETIRED    Comment: Road Architect  Tobacco Use   Smoking status: Former    Types: Cigarettes    Quit date: 11/03/1963    Years since quitting: 59.1   Smokeless tobacco: Never   Tobacco comments:    Former smoker 12/21/22  Vaping Use   Vaping Use: Never used  Substance and Sexual Activity   Alcohol use: No   Drug use: No   Sexual activity: Not Currently  Other Topics Concern   Not on file  Social History Narrative   Lives in [place], alone   Retired from Landscape architect   Social Determinants of Radio broadcast assistant Strain: Not on file  Food Insecurity: Not on file  Transportation Needs: Not on file  Physical Activity: Not on file  Stress: Not on file  Social Connections: Not on file  Intimate Partner Violence: Not on file     ROS- All systems are reviewed and negative except as per the HPI above.  Physical Exam: Vitals:   12/21/22 0936  BP: (!) 146/78  Pulse: 68  Weight: 90.9 kg  Height: 6' (1.829 m)    GEN- The patient is a well appearing elderly male, alert and oriented x 3 today.   Head- normocephalic, atraumatic Eyes-  Sclera clear, conjunctiva pink Ears- hearing  intact Oropharynx- clear Neck- supple  Lungs- Clear to ausculation bilaterally, normal work of breathing Heart- Regular rate and rhythm, no murmurs, rubs or gallops  GI- soft, NT, ND, + BS Extremities- no clubbing, cyanosis, or edema MS- no significant deformity or atrophy Skin- no rash or lesion Psych- euthymic mood, full affect Neuro- strength and sensation are intact  Wt Readings from Last 3 Encounters:  12/21/22 90.9 kg  12/08/22 92.1 kg  11/10/22 89.6 kg    EKG today demonstrates  SR, 1st degree AV block, blocked PAC Vent. rate 68 BPM PR interval 236 ms QRS duration 86 ms QT/QTcB 374/397 ms  Echo 02/22/20 demonstrated   1. Left ventricular ejection fraction, by estimation, is 60 to 65%. The  left ventricle has normal function. The left ventricle has no regional  wall motion abnormalities. Left ventricular diastolic parameters were  normal.   2. Right ventricular systolic function is normal. The right ventricular  size is  normal.   3. Left atrial size was moderately dilated.   4. The mitral valve is normal in structure. No evidence of mitral valve  regurgitation. No evidence of mitral stenosis.   5. The aortic valve is tricuspid. Aortic valve regurgitation is not  visualized. Mild to moderate aortic valve sclerosis/calcification is  present, without any evidence of aortic stenosis.   6. Dilated sinus and root 4.3 cm. Aortic dilatation noted. There is mild  to moderate dilatation at the level of the sinuses of Valsalva measuring  43 mm.   7. The inferior vena cava is normal in size with greater than 50%  respiratory variability, suggesting right atrial pressure of 3 mmHg.   Epic records are reviewed at length today  CHA2DS2-VASc Score = 4  The patient's score is based upon: CHF History: 0 HTN History: 1 Diabetes History: 0 Stroke History: 0 Vascular Disease History: 1 Age Score: 2 Gender Score: 0       ASSESSMENT AND PLAN: 1. Paroxysmal Atrial  Fibrillation/atrial flutter The patient's CHA2DS2-VASc score is 4, indicating a 4.8% annual risk of stroke.   S/p afib and flutter ablation 09/2018 Patient presents for dofetilide admission. Continue Xarelto 20 mg daily, states no missed doses in the last 3 weeks. No recent benadryl use PharmD has screened medications QTc in SR 397 ms Labs today show creatinine at 0.97, K+ 3.8 and mag 2.1, CrCl calculated at 70 mL/min  2. Secondary Hypercoagulable State (ICD10:  D68.69) The patient is at significant risk for stroke/thromboembolism based upon his CHA2DS2-VASc Score of 4.  Continue Rivaroxaban (Xarelto).   3. HTN Stable, no changes today.  4. CAD/aortic atherosclerosis On statin No anginal symptoms.   To be admitted later today once a bed becomes available.   [facility] [address] [place], [place] [address] [phone] 12/21/2022 10:10 AM

## 2022-12-21 NOTE — Care Management (Signed)
  Transition of Care [facility]) Screening Note   Patient Details  Name: Stanley Waters Date of Birth: [birth year]   Transition of Care [facility]) CM/SW Contact:    Bethena Roys, RN Phone Number: 12/21/2022, 1:58 PM    Transition of Care Department [facility]) has reviewed the patient. Patient presented for Tikosyn Load. Benefits check submitted for cost. Case Manager will discuss cost and pharmacy of choice as the patient progresses.

## 2022-12-21 NOTE — Progress Notes (Signed)
Pharmacy: Dofetilide (Tikosyn) - Initial Consult Assessment and Electrolyte Replacement  Pharmacy consulted to assist in monitoring and replacing electrolytes in this 88 y.o. male admitted on 12/21/2022 undergoing dofetilide initiation. First dofetilide dose: 2/19@2000$ .  Assessment:  Patient Exclusion Criteria: If any screening criteria checked as "Yes", then  patient  should NOT receive dofetilide until criteria item is corrected.  If "Yes" please indicate correction plan.  YES  NO Patient  Exclusion Criteria Correction Plan   []$   [x]$   Baseline QTc interval is greater than or equal to 440 msec. IF above YES box checked dofetilide contraindicated unless patient has ICD; then may proceed if QTc 500-550 msec or with known ventricular conduction abnormalities may proceed with QTc 550-600 msec. QTc = 0.36  > Qtc 397 on EKG    []$   [x]$   Patient is known or suspected to have a digoxin level greater than 2 ng/ml: No results found for: "DIGOXIN"     []$   [x]$   Creatinine clearance less than 20 ml/min (calculated using Cockcroft-Gault, actual body weight and serum creatinine): Estimated Creatinine Clearance: 60 mL/min (by C-G formula based on SCr of 0.97 mg/dL).     []$   [x]$  Patient has received drugs known to prolong the QT intervals within the last 48 hours (phenothiazines, tricyclics or tetracyclic antidepressants, erythromycin, H-1 antihistamines, cisapride, fluoroquinolones, azithromycin, ondansetron).   Updated information on QT prolonging agents is available to be searched on the following database:QT prolonging agents     []$   [x]$   Patient received a dose of hydrochlorothiazide (Oretic) alone or in any combination including triamterene (Dyazide, Maxzide) in the last 48 hours.    []$   [x]$  Patient received a medication known to increase dofetilide plasma concentrations prior to initial dofetilide dose:  Trimethoprim (Primsol, Proloprim) in the last 36 hours Verapamil (Calan, Verelan)  in the last 36 hours or a sustained release dose in the last 72 hours Megestrol (Megace) in the last 5 days  Cimetidine (Tagamet) in the last 6 hours Ketoconazole (Nizoral) in the last 24 hours Itraconazole (Sporanox) in the last 48 hours  Prochlorperazine (Compazine) in the last 36 hours     []$   [x]$   Patient is known to have a history of torsades de pointes; congenital or acquired long QT syndromes.    []$   [x]$   Patient has received a Class 1 antiarrhythmic with less than 2 half-lives since last dose. (Disopyramide, Quinidine, Procainamide, Lidocaine, Mexiletine, Flecainide, Propafenone)    []$   [x]$   Patient has received amiodarone therapy in the past 3 months or amiodarone level is greater than 0.3 ng/ml.    Labs:    Component Value Date/Time   K 3.8 12/21/2022 0955   MG 2.1 12/21/2022 0955     Plan: Select One Calculated CrCl  Dose q12h  [x]$  > 60 ml/min 500 mcg  []$  40-60 ml/min 250 mcg  []$  20-40 ml/min 125 mcg   [x]$   Physician selected initial dose within range recommended for patients level of renal function - will monitor for response.  []$   Physician selected initial dose outside of range recommended for patients level of renal function - will discuss if the dose should be altered at this time.   Patient has been appropriately anticoagulated with Xarelto.  Potassium: K 3.8-3.9:  Hold Tikosyn initiation and give KCl 40 mEq po x1 then begin Tikosyn at least 2hr after KCl dose - do not need to recheck K   Magnesium: Mg >2: Appropriate to initiate Tikosyn, no replacement needed  Thank you for allowing pharmacy to participate in this patient's care   Antonietta Jewel, PharmD, [place] Pharmacist  Phone: [phone] 12/21/2022 10:41 AM  Please check AMION for all [place] phone numbers After 10:00 PM, call [place] [phone]

## 2022-12-21 NOTE — TOC Benefit Eligibility Note (Signed)
Patient Teacher, English as a foreign language completed.    The patient is currently admitted and upon discharge could be taking dofetilide (Tikosyn) 500 mcg capusules.  The current 30 day co-pay is $41.39.   The patient is insured through [place], [place] Patient Advocate Specialist [place] Patient Advocate Team Direct Number: [phone]  Fax: [phone]

## 2022-12-21 NOTE — Progress Notes (Signed)
EKG performed 2 hours after administration of Tikosyn. QTc is 486

## 2022-12-22 DIAGNOSIS — I480 Paroxysmal atrial fibrillation: Secondary | ICD-10-CM | POA: Diagnosis not present

## 2022-12-22 LAB — BASIC METABOLIC PANEL
Anion gap: 6 (ref 5–15)
BUN: 11 mg/dL (ref 8–23)
CO2: 28 mmol/L (ref 22–32)
Calcium: 8.8 mg/dL — ABNORMAL LOW (ref 8.9–10.3)
Chloride: 100 mmol/L (ref 98–111)
Creatinine, Ser: 0.94 mg/dL (ref 0.61–1.24)
GFR, Estimated: >60 mL/min (ref >60)
Glucose, Bld: 98 mg/dL (ref 70–99)
Potassium: 4.4 mmol/L (ref 3.5–5.1)
Sodium: 134 mmol/L — ABNORMAL LOW (ref 135–145)

## 2022-12-22 LAB — MAGNESIUM: Magnesium: 2.2 mg/dL (ref 1.7–2.4)

## 2022-12-22 MED ORDER — DOFETILIDE 250 MCG PO CAPS
250.0000 ug | ORAL_CAPSULE | Freq: Two times a day (BID) | ORAL | Status: DC
  Administered 2022-12-22 – 2022-12-24 (×4): 250 ug via ORAL
  Filled 2022-12-22 (×4): qty 1

## 2022-12-22 NOTE — Progress Notes (Signed)
Patient declined norvasc his blood pressure was 108/54 afraid that it would drop his blood pressure.   Larita Fife Student nurse [employer]

## 2022-12-22 NOTE — Progress Notes (Signed)
Mobility Specialist Progress Note:   12/22/22 0900  Mobility  Activity Ambulated independently in hallway  Level of Assistance Independent  Assistive Device None  Distance Ambulated (ft) 500 ft  Activity Response Tolerated well  Mobility Referral Yes  $Mobility charge 1 Mobility   Pt agreeable to mobility session. No physical assistance required. HR 80s-90s throughout ambulation. Pt left sitting EOB with all needs met.  Nelta Numbers Mobility Specialist Please contact via SecureChat or  Rehab office at [phone]

## 2022-12-22 NOTE — Progress Notes (Signed)
Rounding Note    Patient Name: Stanley Waters Date of Encounter: 12/22/2022  [place] Cardiologist: Quay Burow, MD   Subjective   Doing OK, hard to sleep here  Inpatient Medications    Scheduled Meds:  amLODipine  5 mg Oral Daily   cholecalciferol  2,000 Units Oral Daily   dofetilide  500 mcg Oral BID   doxazosin  8 mg Oral QHS   polyethylene glycol  17 g Oral Daily   rivaroxaban  20 mg Oral Q supper   rOPINIRole  0.25 mg Oral QHS   simvastatin  5 mg Oral Daily   sodium chloride flush  3 mL Intravenous Q12H   Continuous Infusions:  sodium chloride     PRN Meds: sodium chloride, acetaminophen, brimonidine, nitroGLYCERIN, sodium chloride flush   Vital Signs    Vitals:   12/21/22 1022 12/21/22 1950 12/21/22 2310 12/22/22 0505  BP:  (!) 159/65 124/69 (!) 168/89  Pulse:  70 (!) 56 71  Resp:  18 16 18  $ Temp: 97.7 F (36.5 C) 97.8 F (36.6 C) 97.7 F (36.5 C) 97.7 F (36.5 C)  TempSrc: Oral Oral Oral Oral  SpO2:  100% 98% 98%  Weight:    88.1 kg  Height:        Intake/Output Summary (Last 24 hours) at 12/22/2022 0719 Last data filed at 12/22/2022 0500 Gross per 24 hour  Intake 237 ml  Output 250 ml  Net -13 ml      12/22/2022    5:05 AM 12/21/2022   10:19 AM 12/21/2022    9:36 AM  Last 3 Weights  Weight (lbs) 194 lb 3.6 oz 196 lb 8 oz 200 lb 6.4 oz  Weight (kg) 88.1 kg 89.132 kg 90.901 kg      Telemetry    SB/SR 50's-60's - Personally Reviewed  ECG    Post dose, SB 56bpm, 1st degree AVBlock, 241m, QTc 4669mThis AM SR 62bpm, 1st degree AVblock 25020m QTc 456m107mboth Personally Reviewed  Physical Exam   GEN: No acute distress.   Neck: No JVD Cardiac: RRR, no murmurs, rubs, or gallops.  Respiratory: CTA b/l. GI: Soft, nontender, non-distended  MS: No edema; No deformity. Neuro:  Nonfocal  Psych: Normal affect   Labs    High Sensitivity Troponin:  No results for input(s): "TROPONINIHS" in the last 720 hours.    Chemistry Recent Labs  Lab 12/21/22 0955 12/22/22 0222  NA 134* 134*  K 3.8 4.4  CL 101 100  CO2 24 28  GLUCOSE 105* 98  BUN 10 11  CREATININE 0.97 0.94  CALCIUM 8.8* 8.8*  MG 2.1 2.2  GFRNONAA >60 >60  ANIONGAP 9 6    Lipids No results for input(s): "CHOL", "TRIG", "HDL", "LABVLDL", "LDLCALC", "CHOLHDL" in the last 168 hours.  HematologyNo results for input(s): "WBC", "RBC", "HGB", "HCT", "MCV", "MCH", "MCHC", "RDW", "PLT" in the last 168 hours. Thyroid No results for input(s): "TSH", "FREET4" in the last 168 hours.  BNPNo results for input(s): "BNP", "PROBNP" in the last 168 hours.  DDimer No results for input(s): "DDIMER" in the last 168 hours.   Radiology    No results found.  Cardiac Studies   02/22/2020: TTE 1. Left ventricular ejection fraction, by estimation, is 60 to 65%. The  left ventricle has normal function. The left ventricle has no regional  wall motion abnormalities. Left ventricular diastolic parameters were  normal.   2. Right ventricular systolic function is normal. The  right ventricular  size is normal.   3. Left atrial size was moderately dilated.   4. The mitral valve is normal in structure. No evidence of mitral valve  regurgitation. No evidence of mitral stenosis.   5. The aortic valve is tricuspid. Aortic valve regurgitation is not  visualized. Mild to moderate aortic valve sclerosis/calcification is  present, without any evidence of aortic stenosis.   6. Dilated sinus and root 4.3 cm. Aortic dilatation noted. There is mild  to moderate dilatation at the level of the sinuses of Valsalva measuring  43 mm.   7. The inferior vena cava is normal in size with greater than 50%  respiratory variability, suggesting right atrial pressure of 3 mmHg.    09/20/2018: EPS/ablation CONCLUSIONS: 1. Mitral annular clockwise atrial flutter upon presentation successfully ablated along the mitral annulus.   2. Intracardiac echo reveals a large sized left  atrium. 3. Successful electrical isolation and anatomical encircling of all four pulmonary veins with radiofrequency current. 4. Additional mapping and ablation within the left atrium due to persistence of atrial fibrillation with a posterior wall box demonstrated  5. Isthmus dependant right atrial flutter also observed and successfully ablated along the cavo-tricuspid isthmus with complete bidirectional isthmus block achieved 6. No inducible arrhythmias following ablation 7. No early apparent complications.  Patient Profile     88 y.o. male w/PMHx of HTN, HLD, AFib/AFlutter admitted for Tikosyn initiation  Prior AAD/arrhythmia hx Amiodarone Propafenone S/p PVI/CTI ablation 09/20/2018  Assessment & Plan    Paroxysmal AFib AFlutter (mitral annular) CHA2DS2Vasc is 3, on xarelto Tikosyn load is in progress K+ 4.4 Mag 2.2. Creat 0.94 (stable) QTc stable   HTN Home meds  For questions or updates, please contact [place] Please consult [email or web address] for contact info under        Signed, Baldwin Jamaica, PA-C  12/22/2022, 7:19 AM

## 2022-12-22 NOTE — H&P (Signed)
Primary Care Physician: Deland Pretty, MD Primary Cardiologist: Dr [name] Primary Electrophysiologist: Dr Myles Gip Referring Physician: Dr Myles Gip     Stanley Waters is a 88 y.o. male with a history of HTN, HLD, CAD/aortic atherosclerosis, atrial flutter, atrial fibrillation who presents for follow up in the [facility]. He is s/p ablation 09/2018. He did have recurrence in the setting of RSV infection. He had more documented episodes at his visit with Dr [name] on 11/10/22. He was seen by Dr Myles Gip who recommended dofetilide loading. Patient is on Xarelto for a CHADS2VASC score of 4.   Patient presents today for dofetilide loading. He denies any missed doses of anticoagulation in the past 3 weeks. He has been in SR for the past 5 days per patient.    Today, he denies symptoms of palpitations, chest pain, shortness of breath, orthopnea, PND, lower extremity edema, dizziness, presyncope, syncope, snoring, daytime somnolence, bleeding, or neurologic sequela. The patient is tolerating medications without difficulties and is otherwise without complaint today.      Atrial Fibrillation Risk Factors:   he does not have symptoms or diagnosis of sleep apnea. he does not have a history of rheumatic fever.     he has a BMI of Body mass index is 27.18 kg/m.[name]    Filed Weights    12/21/22 0936  Weight: 90.9 kg           Family History  Problem Relation Age of Onset   Heart failure Brother        Atrial Fibrillation Management history:   Previous antiarrhythmic drugs: amiodarone  Previous cardioversions: 2013, 2014, 2019 Previous ablations: 09/2018 CHADS2VASC score: 4 Anticoagulation history: Xarelto         Past Medical History:  Diagnosis Date   Atrial flutter ([place])     Atypical chest pain      Myoview performed 07/23/11 was completely normal. Post stress EF 61%.   Bruit      Left asymptomatic bruit. Carotid Duplex 12/13/12 =mildly abnormal. *BILATERAL  BULB/PROXIMAL ICAs: Demonstrated a mild amount of fibrous plaque w/no evidence od significant diameter reduction, tortuosity or other vascular abnormality.   Enlarged prostate      BPH - PSA of 6.7 this is consistant with his last PSA of 6.3   Hyperlipemia     Hypertension     Inguinal hernia      Inguinal hernia repair (x) 2 1991, 2011   Intestinal polyps 09/22/11    Colonoscopy removed a 2 mm sessile cecal polyp with a cold snare.   Measles     Mumps     Normal coronary arteries 2002   Peripheral neuropathy     Persistent atrial fibrillation (HCC)     Seasonal allergies     Skin cancer     Thrombocytopenia ([place]) 2011    Very mild with platelets of 135,000.   Tinnitus     Umbilical hernia     Vitamin D deficiency           Past Surgical History:  Procedure Laterality Date   ATRIAL FIBRILLATION ABLATION   09/20/2018   ATRIAL FIBRILLATION ABLATION N/A 09/20/2018    Procedure: ATRIAL FIBRILLATION ABLATION;  Surgeon: Thompson Grayer, MD;  Location: [facility];  Service: Cardiovascular;  Laterality: N/A;   CARDIAC CATHETERIZATION   2002    "Normal cardiac catheterization"   CARDIOVERSION   07/07/2012    A fib - Cardioversion successful.   CARDIOVERSION  N/A 06/30/2013    Procedure: CARDIOVERSION;  Surgeon: Sanda Klein, MD;  Location: [facility];  Service: Cardiovascular;  Laterality: N/A;   CARDIOVERSION N/A 08/02/2018    Procedure: CARDIOVERSION;  Surgeon: Lelon Perla, MD;  Location: [facility] ENDOSCOPY;  Service: Cardiovascular;  Laterality: N/A;   Carotid Duplex   12/13/12    For asymptomatic bruit. Mildly abnormal.  *BILATERAL BULB/PROXIMAL ICAs: Demonstrated a mild amount of fibrous plaque w/no evidence of significant diameter reduction, tortuosity or other vascular abnormality.    COLONOSCOPY W/ POLYPECTOMY   09/22/11    2 mm sessile cecal polyp was removed with a cold snare.   HAND SURGERY       INGUINAL HERNIA REPAIR   1991 & 2011   TEE WITHOUT CARDIOVERSION   07/07/2012     Procedure: TRANSESOPHAGEAL ECHOCARDIOGRAM (TEE);  Surgeon: Pixie Casino, MD;  Location: [facility] ENDOSCOPY;  Service: Cardiovascular;  Laterality: N/A;   TEE WITHOUT CARDIOVERSION N/A 08/02/2018    Procedure: TRANSESOPHAGEAL ECHOCARDIOGRAM (TEE);  Surgeon: Lelon Perla, MD;  Location: [facility] ENDOSCOPY;  Service: Cardiovascular;  Laterality: N/A;      No current outpatient medications on file.    No current facility-administered medications for this encounter.           Allergies  Allergen Reactions   Prednisone Swelling, Other (See Comments) and Hypertension      Tachycardia   Trazodone Hcl Other (See Comments)   Doxycycline Rash      Social History         Socioeconomic History   Marital status: Widowed      Spouse name: Not on file   Number of children: 2   Years of education: Not on file   Highest education level: Not on file  Occupational History      Employer: RETIRED      Comment: Road Architect  Tobacco Use   Smoking status: Former      Types: Cigarettes      Quit date: 11/03/1963      Years since quitting: 59.1   Smokeless tobacco: Never   Tobacco comments:      Former smoker 12/21/22  Vaping Use   Vaping Use: Never used  Substance and Sexual Activity   Alcohol use: No   Drug use: No   Sexual activity: Not Currently  Other Topics Concern   Not on file  Social History Narrative    Lives in [place], alone    Retired from Landscape architect    Social Determinants of Adult nurse Strain: Not on file  Food Insecurity: Not on file  Transportation Needs: Not on file  Physical Activity: Not on file  Stress: Not on file  Social Connections: Not on file  Intimate Partner Violence: Not on file        ROS- All systems are reviewed and negative except as per the HPI above.   Physical Exam:    Vitals:    12/21/22 0936  BP: (!) 146/78  Pulse: 68  Weight: 90.9 kg  Height: 6' (1.829 m)      GEN- The patient is a well appearing elderly  male, alert and oriented x 3 today.   Head- normocephalic, atraumatic Eyes-  Sclera clear, conjunctiva pink Ears- hearing intact Oropharynx- clear Neck- supple  Lungs- Clear to ausculation bilaterally, normal work of breathing Heart- Regular rate and rhythm, no murmurs, rubs or gallops  GI- soft, NT, ND, + BS Extremities- no clubbing, cyanosis, or edema  MS- no significant deformity or atrophy Skin- no rash or lesion Psych- euthymic mood, full affect Neuro- strength and sensation are intact      Wt Readings from Last 3 Encounters:  12/21/22 90.9 kg  12/08/22 92.1 kg  11/10/22 89.6 kg      EKG today demonstrates  SR, 1st degree AV block, blocked PAC Vent. rate 68 BPM PR interval 236 ms QRS duration 86 ms QT/QTcB 374/397 ms   Echo 02/22/20 demonstrated   1. Left ventricular ejection fraction, by estimation, is 60 to 65%. The  left ventricle has normal function. The left ventricle has no regional  wall motion abnormalities. Left ventricular diastolic parameters were  normal.   2. Right ventricular systolic function is normal. The right ventricular  size is normal.   3. Left atrial size was moderately dilated.   4. The mitral valve is normal in structure. No evidence of mitral valve  regurgitation. No evidence of mitral stenosis.   5. The aortic valve is tricuspid. Aortic valve regurgitation is not  visualized. Mild to moderate aortic valve sclerosis/calcification is  present, without any evidence of aortic stenosis.   6. Dilated sinus and root 4.3 cm. Aortic dilatation noted. There is mild  to moderate dilatation at the level of the sinuses of Valsalva measuring  43 mm.   7. The inferior vena cava is normal in size with greater than 50%  respiratory variability, suggesting right atrial pressure of 3 mmHg.    Epic records are reviewed at length today   CHA2DS2-VASc Score = 4  The patient's score is based upon: CHF History: 0 HTN History: 1 Diabetes History: 0 Stroke  History: 0 Vascular Disease History: 1 Age Score: 2 Gender Score: 0         ASSESSMENT AND PLAN: 1. Paroxysmal Atrial Fibrillation/atrial flutter The patient's CHA2DS2-VASc score is 4, indicating a 4.8% annual risk of stroke.   S/p afib and flutter ablation 09/2018 Patient presents for dofetilide admission. Continue Xarelto 20 mg daily, states no missed doses in the last 3 weeks. No recent benadryl use PharmD has screened medications QTc in SR 397 ms Labs today show creatinine at 0.97, K+ 3.8 and mag 2.1, CrCl calculated at 70 mL/min   2. Secondary Hypercoagulable State (ICD10:  D68.69) The patient is at significant risk for stroke/thromboembolism based upon his CHA2DS2-VASc Score of 4.  Continue Rivaroxaban (Xarelto).    3. HTN Stable, no changes today.   4. CAD/aortic atherosclerosis On statin No anginal symptoms.     To be admitted later today once a bed becomes available.     [facility] [address] [place], [place] [address] [phone] 12/21/2022 10:10 AM    ----------------------------------------------  I have seen, examined the patient, and reviewed the above assessment and plan.    Presents for Dofetilide loading for his symptomatic paroxysmal atrial fibrillation and flutter. No complaints this evening.  GEN: No acute distress.   Cardiac: RRR, no murmurs, rubs, or gallops.  Respiratory: Clear to auscultation bilaterally.  ECG reviewed and shows QTc 453m. Cr 0.97.  #Symptomatic paroxysmal atrial fibrillation and flutter #High risk medication - dofetilide Start dofetilide this evening. Continue xarelto for stroke ppx.  #CAD/Aortic Athero Continue statin  #HTN Controlled  CVickie Epley MD 12/22/2022 1:36 PM

## 2022-12-22 NOTE — Progress Notes (Signed)
Post dose EKG reviewed and d/w Dr. Myles Gip. He has had some QT prolongation and will reduce his dose to 262mg  RTommye Standard PA-C

## 2022-12-23 ENCOUNTER — Other Ambulatory Visit (HOSPITAL_COMMUNITY): Payer: Self-pay

## 2022-12-23 DIAGNOSIS — I480 Paroxysmal atrial fibrillation: Secondary | ICD-10-CM | POA: Diagnosis not present

## 2022-12-23 LAB — BASIC METABOLIC PANEL
Anion gap: 5 (ref 5–15)
BUN: 13 mg/dL (ref 8–23)
CO2: 28 mmol/L (ref 22–32)
Calcium: 8.5 mg/dL — ABNORMAL LOW (ref 8.9–10.3)
Chloride: 98 mmol/L (ref 98–111)
Creatinine, Ser: 0.92 mg/dL (ref 0.61–1.24)
GFR, Estimated: >60 mL/min (ref >60)
Glucose, Bld: 100 mg/dL — ABNORMAL HIGH (ref 70–99)
Potassium: 4 mmol/L (ref 3.5–5.1)
Sodium: 131 mmol/L — ABNORMAL LOW (ref 135–145)

## 2022-12-23 LAB — MAGNESIUM: Magnesium: 2.1 mg/dL (ref 1.7–2.4)

## 2022-12-23 NOTE — Progress Notes (Signed)
Post dose EKG is reviewed SR 61bpm Manually measured QT 440-483m, QTc 444-4656mContinue Tikosyn  ReTommye StandardPA-C

## 2022-12-23 NOTE — Progress Notes (Signed)
Pharmacy: Dofetilide (Tikosyn) - Follow Up Assessment and Electrolyte Replacement  Pharmacy consulted to assist in monitoring and replacing electrolytes in this 88 y.o. male admitted on 12/21/2022 undergoing dofetilide initiation. First dofetilide dose: 2/19@2020$ .  Labs:    Component Value Date/Time   K 4.0 12/23/2022 0323   MG 2.1 12/23/2022 0323     Plan: Potassium: K >/= 4: No additional supplementation needed  Magnesium: Mg > 2: No additional supplementation needed   Thank you for allowing pharmacy to participate in this patient's care   Antonietta Jewel, PharmD, [place] Pharmacist  Phone: [phone] 12/23/2022 7:27 AM  Please check AMION for all [place] phone numbers After 10:00 PM, call [place] [phone]

## 2022-12-23 NOTE — Progress Notes (Signed)
Rounding Note    Patient Name: Stanley Waters Date of Encounter: 12/23/2022  [place] Cardiologist: Quay Burow, MD   Subjective   Doing OK, mentions long hx of orthostatic dizziness.  None here  Inpatient Medications    Scheduled Meds:  amLODipine  5 mg Oral Daily   cholecalciferol  2,000 Units Oral Daily   dofetilide  250 mcg Oral BID   doxazosin  8 mg Oral QHS   polyethylene glycol  17 g Oral Daily   rivaroxaban  20 mg Oral Q supper   rOPINIRole  0.25 mg Oral QHS   simvastatin  5 mg Oral Daily   sodium chloride flush  3 mL Intravenous Q12H   Continuous Infusions:  sodium chloride     PRN Meds: sodium chloride, acetaminophen, brimonidine, nitroGLYCERIN, sodium chloride flush   Vital Signs    Vitals:   12/22/22 0823 12/22/22 1010 12/22/22 1138 12/22/22 2025  BP: 134/73 (!) 108/54 129/65 134/71  Pulse: 76  60 62  Resp: 16  18 19  $ Temp: 98.1 F (36.7 C)  97.7 F (36.5 C) 97.6 F (36.4 C)  TempSrc: Oral   Oral  SpO2: 98%  98%   Weight:      Height:       No intake or output data in the 24 hours ending 12/23/22 0716     12/22/2022    5:05 AM 12/21/2022   10:19 AM 12/21/2022    9:36 AM  Last 3 Weights  Weight (lbs) 194 lb 3.6 oz 196 lb 8 oz 200 lb 6.4 oz  Weight (kg) 88.1 kg 89.132 kg 90.901 kg      Telemetry    SB/SR 50's-60's, occasionally appears an ectopic atrial rhythm, no advanced heart block or significant bradycardia - Personally Reviewed  ECG    SB 54bpm, 1st degree AVBlock 255m, QTc 4620m- both Personally Reviewed  Physical Exam   Pt seen by Dr. MeMyles Giphis morning, exam remains essentially unchanged GEN: No acute distress.   Neck: No JVD Cardiac: RRR, no murmurs, rubs, or gallops.  Respiratory: CTA b/l. GI: Soft, nontender, non-distended  MS: No edema; No deformity. Neuro:  Nonfocal  Psych: Normal affect   Labs    High Sensitivity Troponin:  No results for input(s): "TROPONINIHS" in the last 720 hours.    Chemistry Recent Labs  Lab 12/21/22 0955 12/22/22 0222 12/23/22 0323  NA 134* 134* 131*  K 3.8 4.4 4.0  CL 101 100 98  CO2 24 28 28  $ GLUCOSE 105* 98 100*  BUN 10 11 13  $ CREATININE 0.97 0.94 0.92  CALCIUM 8.8* 8.8* 8.5*  MG 2.1 2.2 2.1  GFRNONAA >60 >60 >60  ANIONGAP 9 6 5    $ Lipids No results for input(s): "CHOL", "TRIG", "HDL", "LABVLDL", "LDLCALC", "CHOLHDL" in the last 168 hours.  HematologyNo results for input(s): "WBC", "RBC", "HGB", "HCT", "MCV", "MCH", "MCHC", "RDW", "PLT" in the last 168 hours. Thyroid No results for input(s): "TSH", "FREET4" in the last 168 hours.  BNPNo results for input(s): "BNP", "PROBNP" in the last 168 hours.  DDimer No results for input(s): "DDIMER" in the last 168 hours.   Radiology    No results found.  Cardiac Studies   02/22/2020: TTE 1. Left ventricular ejection fraction, by estimation, is 60 to 65%. The  left ventricle has normal function. The left ventricle has no regional  wall motion abnormalities. Left ventricular diastolic parameters were  normal.   2. Right ventricular systolic function  is normal. The right ventricular  size is normal.   3. Left atrial size was moderately dilated.   4. The mitral valve is normal in structure. No evidence of mitral valve  regurgitation. No evidence of mitral stenosis.   5. The aortic valve is tricuspid. Aortic valve regurgitation is not  visualized. Mild to moderate aortic valve sclerosis/calcification is  present, without any evidence of aortic stenosis.   6. Dilated sinus and root 4.3 cm. Aortic dilatation noted. There is mild  to moderate dilatation at the level of the sinuses of Valsalva measuring  43 mm.   7. The inferior vena cava is normal in size with greater than 50%  respiratory variability, suggesting right atrial pressure of 3 mmHg.    09/20/2018: EPS/ablation CONCLUSIONS: 1. Mitral annular clockwise atrial flutter upon presentation successfully ablated along the mitral  annulus.   2. Intracardiac echo reveals a large sized left atrium. 3. Successful electrical isolation and anatomical encircling of all four pulmonary veins with radiofrequency current. 4. Additional mapping and ablation within the left atrium due to persistence of atrial fibrillation with a posterior wall box demonstrated  5. Isthmus dependant right atrial flutter also observed and successfully ablated along the cavo-tricuspid isthmus with complete bidirectional isthmus block achieved 6. No inducible arrhythmias following ablation 7. No early apparent complications.  Patient Profile     88 y.o. male w/PMHx of HTN, HLD, AFib/AFlutter admitted for Tikosyn initiation  Prior AAD/arrhythmia hx Amiodarone Propafenone S/p PVI/CTI ablation 09/20/2018 Tikosyn Feb 2024  Assessment & Plan    Paroxysmal AFib AFlutter (mitral annular) CHA2DS2Vasc is 3, on xarelto Tikosyn load is in progress K+ 4.0 Mag 2.1 Creat 0.92 (stable) QTc stable after dose reduction  Anticipated discharge tomorrow  HTN Home meds  For questions or updates, please contact [place] Please consult [email or web address] for contact info under        Signed, Baldwin [place], PA-C  12/23/2022, 7:16 AM

## 2022-12-23 NOTE — Progress Notes (Signed)
Mobility Specialist Progress Note:   12/23/22 0900  Mobility  Activity Ambulated independently in hallway  Level of Assistance Independent  Assistive Device None  Distance Ambulated (ft) 500 ft  Activity Response Tolerated well  Mobility Referral Yes  $Mobility charge 1 Mobility   Pt eager for mobility session. Required no physical assistance throughout. HR 80s-90s throughout ambulation. Back in chair with all needs met.  Nelta Numbers Mobility Specialist Please contact via SecureChat or  Rehab office at [phone]

## 2022-12-23 NOTE — Care Management (Signed)
12-23-22 1119 Case Manager spoke with patient regarding Dofetilide cost of $41.39. Patient states that after filling a 3 month supply- he will be in the (donut hole) coverage gap. Patient reports that he has to pay for Xarelto as well and that drug is expensive. Case Manager did discuss with the patient regarding patient assistance via the company. Case Manager asked the unit pharmacists if she could assist with the application. Patient is undecided on how to proceed at this time. Initial Rx can be filled via [facility]. Case Manager will follow back up with the patient regarding decision on refills.

## 2022-12-24 ENCOUNTER — Telehealth: Payer: Self-pay | Admitting: Cardiovascular Disease

## 2022-12-24 ENCOUNTER — Other Ambulatory Visit: Payer: Self-pay

## 2022-12-24 ENCOUNTER — Encounter (HOSPITAL_COMMUNITY): Payer: Self-pay | Admitting: Cardiology

## 2022-12-24 ENCOUNTER — Other Ambulatory Visit (HOSPITAL_COMMUNITY): Payer: Self-pay

## 2022-12-24 DIAGNOSIS — I480 Paroxysmal atrial fibrillation: Secondary | ICD-10-CM | POA: Diagnosis not present

## 2022-12-24 LAB — BASIC METABOLIC PANEL
Anion gap: 10 (ref 5–15)
BUN: 19 mg/dL (ref 8–23)
CO2: 25 mmol/L (ref 22–32)
Calcium: 8.9 mg/dL (ref 8.9–10.3)
Chloride: 100 mmol/L (ref 98–111)
Creatinine, Ser: 0.91 mg/dL (ref 0.61–1.24)
GFR, Estimated: >60 mL/min (ref >60)
Glucose, Bld: 99 mg/dL (ref 70–99)
Potassium: 4.2 mmol/L (ref 3.5–5.1)
Sodium: 135 mmol/L (ref 135–145)

## 2022-12-24 LAB — MAGNESIUM: Magnesium: 2.1 mg/dL (ref 1.7–2.4)

## 2022-12-24 MED ORDER — DOFETILIDE 250 MCG PO CAPS
250.0000 ug | ORAL_CAPSULE | Freq: Two times a day (BID) | ORAL | 5 refills | Status: DC
  Filled 2022-12-24: qty 60, 30d supply, fill #0

## 2022-12-24 NOTE — Progress Notes (Signed)
AVS given and reviewed with pt. Medications discussed. All questions answered to satisfaction. Pt verbalized understanding of information given. Pt to be escorted off the unit with all belongings via wheelchair by volunteer services to pick up TOC meds.

## 2022-12-24 NOTE — Discharge Summary (Signed)
ELECTROPHYSIOLOGY PROCEDURE DISCHARGE SUMMARY    Patient ID: Stanley Waters,  MRN: [id number], DOB/AGE: 01-03-1935 88 y.o.  Admit date: 12/21/2022 Discharge date: 12/24/2022  Primary Care Physician: Deland Pretty, MD  Primary Cardiologist: Dr. [name] Electrophysiologist: Dr. Myles Gip  Primary Discharge Diagnosis:  1.  Paroxysmal atrial fibrillation  2.  AFlutter status post Tikosyn loading this admission      CHA2DS2Vasc is 3, on Xarelto  Secondary Discharge Diagnosis:  HTN   Allergies  Allergen Reactions   Prednisone Swelling, Other (See Comments) and Hypertension    Tachycardia   Trazodone Hcl Other (See Comments)   Doxycycline Rash     Procedures This Admission:  1.  Tikosyn loading    Brief HPI: Stanley Waters is a 88 y.o. male with a past medical history as noted above.  They were referred to EP in the outpatient setting for treatment options of atrial fibrillation.  Risks, benefits, and alternatives to Tikosyn were reviewed with the patient who wished to proceed.    Hospital Course:  The patient was admitted and Tikosyn was initiated.  Renal function and electrolytes were followed during the hospitalization.  The patient's QTc did lngthen requiring a dose reduction though then remained stable.  He arrived in SR, did not require DCCV.  He was monitored until discharge on telemetry which demonstrated SB/SR, 1st degree ABVblock.  On the day of discharge, he feels well, was examined by Dr Myles Gip who considered the patient stable for discharge to home.  Follow-up has been arranged with the AFib clinic in 1 week and with EP team in 4 weeks.   Tikosyn teaching was completed No new or additional electrolyte replacement for home  Amlodipine appeared on his PTA list of medications, he declined this medication here, tells me he has via his PMD lisinopril '20mg'$  that he uses only when his BP gets to 160 or higher. This has been his regime for quite a long time. He was not  taking amlodipine at home. I have updated his medication list  Dofetilide and Xarelto will get him to his doughnut hole rapidly.  With the pharmacy team help, financial assistance paperwork for Xarelto was completed/sent in.  Physical Exam: Vitals:   12/23/22 1839 12/23/22 2017 12/24/22 0510 12/24/22 1000  BP: (!) 156/81 129/63 (!) 164/88 (!) 144/71  Pulse:  63 68   Resp:  18 18   Temp:  97.9 F (36.6 C)    TempSrc:  Oral    SpO2:  96% 92%   Weight:      Height:         GEN- The patient is well appearing, alert and oriented x 3 today.   HEENT: normocephalic, atraumatic; sclera clear, conjunctiva pink; hearing intact; oropharynx clear; neck supple, no JVP Lymph- no cervical lymphadenopathy Lungs-  CTA b/l, normal work of breathing.  No wheezes, rales, rhonchi Heart-  RRR, no murmurs, rubs or gallops, PMI not laterally displaced GI- soft, non-tender, non-distended Extremities- no clubbing, cyanosis, or edema MS- no significant deformity or atrophy Skin- warm and dry, no rash or lesion Psych- euthymic mood, full affect Neuro- strength and sensation are intact   Labs:   Lab Results  Component Value Date   WBC 7.6 07/17/2022   HGB 16.2 07/17/2022   HCT 47.4 07/17/2022   MCV 93.5 07/17/2022   PLT 122 (L) 07/17/2022    Recent Labs  Lab 12/24/22 0248  NA 135  K 4.2  CL 100  CO2  25  BUN 19  CREATININE 0.91  CALCIUM 8.9  GLUCOSE 99     Discharge Medications:  Allergies as of 12/24/2022       Reactions   Prednisone Swelling, Other (See Comments), Hypertension   Tachycardia   Trazodone Hcl Other (See Comments)   Doxycycline Rash        Medication List     TAKE these medications    B-12 2500 MCG Subl Place 1 tablet under the tongue daily.   dofetilide 250 MCG capsule Commonly known as: TIKOSYN Take 1 capsule (250 mcg total) by mouth 2 (two) times daily.   doxazosin 8 MG tablet Commonly known as: CARDURA Take 1 tablet (8 mg total) by mouth at  bedtime.   lisinopril 20 MG tablet Commonly known as: ZESTRIL Take 20 mg by mouth daily as needed (Blood pressure greater then 160).   nitroGLYCERIN 0.4 MG SL tablet Commonly known as: NITROSTAT PLACE 1 TABLET UNDER THE TONGUE EVERY 5 MINUTES AS NEEDED FOR CHEST PAIN What changed:  how much to take how to take this when to take this reasons to take this additional instructions   polyethylene glycol 17 g packet Commonly known as: MIRALAX / GLYCOLAX Take 17 g by mouth daily.   rivaroxaban 20 MG Tabs tablet Commonly known as: Xarelto Take 1 tablet (20 mg total) by mouth daily with supper.   rOPINIRole 0.25 MG tablet Commonly known as: REQUIP Take 0.25 mg by mouth at bedtime.   simvastatin 5 MG tablet Commonly known as: ZOCOR Take 5 mg by mouth daily.   Vitamin D3 50 MCG (2000 UT) Tabs Generic drug: Cholecalciferol Take 2,000 Units by mouth daily.        Disposition: home Discharge Instructions     Diet - low sodium heart healthy   Complete by: As directed    Increase activity slowly   Complete by: As directed         Duration of Discharge Encounter: Greater than 30 minutes including physician time.  Venetia Night, PA-C 12/24/2022 12:29 PM

## 2022-12-24 NOTE — Progress Notes (Addendum)
Pharmacy: Dofetilide (Tikosyn) - Follow Up Assessment and Electrolyte Replacement  Pharmacy consulted to assist in monitoring and replacing electrolytes in this 88 y.o. male admitted on 12/21/2022 undergoing dofetilide initiation. First dofetilide dose: 2/19@2020$ .  Labs:    Component Value Date/Time   K 4.2 12/24/2022 0248   MG 2.1 12/24/2022 0248     Plan: Potassium: K >/= 4: No additional supplementation needed  Magnesium: Mg > 2: No additional supplementation needed  As patient has required on average 10 mEq of potassium replacement every day, no supplementation recommended at discharge.   Thank you for allowing pharmacy to participate in this patient's care   Antonietta Jewel, PharmD, [place] Pharmacist  Phone: [phone] 12/24/2022 7:29 AM  Please check AMION for all [place] phone numbers After 10:00 PM, call [place] [phone]

## 2022-12-24 NOTE — Telephone Encounter (Signed)
Pt c/o medication issue:  1. Name of Medication: dofetilide (TIKOSYN) 250 MCG capsule   2. How are you currently taking this medication (dosage and times per day)?    3. Are you having a reaction (difficulty breathing--STAT)? no  4. What is your medication issue? Patient calling in to talk bout the side effects of this medication. Please advise

## 2022-12-24 NOTE — Progress Notes (Signed)
EKG and telemetry reviewed Dr. Myles Gip has seen the patient. QTc and rhythm stable Anticipate d/c this afternoon  Tommye Standard, PA-C

## 2022-12-24 NOTE — Care Management (Signed)
1026 12-24-22 Case Manager spoke with the patient this morning. Patient would like to have the initial Rx filled via [place] and the Rx refills 90 day supply escribed to [facility]. No further needs identified at this time.

## 2022-12-24 NOTE — Care Management Important Message (Signed)
Important Message  Patient Details  Name: MANKIRAT IGLESIAS MRN: [id number] Date of Birth: [birth year]   Medicare Important Message Given:  Yes     Shelda Altes 12/24/2022, 9:28 AM

## 2022-12-25 ENCOUNTER — Other Ambulatory Visit (HOSPITAL_COMMUNITY): Payer: Self-pay | Admitting: *Deleted

## 2022-12-25 MED ORDER — CALCIUM CITRATE 250 MG PO TABS: 1.0000 | ORAL_TABLET | Freq: Every day | ORAL | 0 refills | Status: AC

## 2022-12-25 MED ORDER — ONE-A-DAY MENS PO TABS: 1.0000 | ORAL_TABLET | Freq: Every day | ORAL | 0 refills | Status: AC

## 2022-12-25 MED ORDER — VITAMIN C 250 MG PO TABS: 250.0000 mg | ORAL_TABLET | Freq: Every day | ORAL | Status: AC

## 2022-12-25 NOTE — Telephone Encounter (Signed)
Pt states he has had a rash for 6 months and noted on patient education to report rash after starting tikosyn. Reassured patient is ok to continue tikosyn. Report if rash worsens. Pt in agreement.

## 2022-12-30 ENCOUNTER — Ambulatory Visit (HOSPITAL_COMMUNITY)
Admit: 2022-12-30 | Discharge: 2022-12-30 | Disposition: A | Discharge status: Home / Self Care | Payer: Medicare HMO | Attending: Physician Assistant | Admitting: Physician Assistant

## 2022-12-30 ENCOUNTER — Encounter (HOSPITAL_COMMUNITY): Payer: Self-pay | Admitting: Physician Assistant

## 2022-12-30 ENCOUNTER — Other Ambulatory Visit (HOSPITAL_COMMUNITY): Payer: Self-pay

## 2022-12-30 ENCOUNTER — Telehealth: Payer: Self-pay | Admitting: Hematology

## 2022-12-30 VITALS — BP 144/82 | HR 75 | Ht 72.0 in | Wt 202.4 lb

## 2022-12-30 DIAGNOSIS — I480 Paroxysmal atrial fibrillation: Secondary | ICD-10-CM | POA: Insufficient documentation

## 2022-12-30 DIAGNOSIS — I2510 Atherosclerotic heart disease of native coronary artery without angina pectoris: Secondary | ICD-10-CM | POA: Diagnosis not present

## 2022-12-30 DIAGNOSIS — D6869 Other thrombophilia: Secondary | ICD-10-CM | POA: Diagnosis not present

## 2022-12-30 DIAGNOSIS — E785 Hyperlipidemia, unspecified: Secondary | ICD-10-CM | POA: Insufficient documentation

## 2022-12-30 DIAGNOSIS — I10 Essential (primary) hypertension: Secondary | ICD-10-CM | POA: Diagnosis not present

## 2022-12-30 DIAGNOSIS — I4892 Unspecified atrial flutter: Secondary | ICD-10-CM | POA: Insufficient documentation

## 2022-12-30 DIAGNOSIS — Z79899 Other long term (current) drug therapy: Secondary | ICD-10-CM | POA: Diagnosis not present

## 2022-12-30 DIAGNOSIS — Z5181 Encounter for therapeutic drug level monitoring: Secondary | ICD-10-CM | POA: Diagnosis not present

## 2022-12-30 DIAGNOSIS — Z7901 Long term (current) use of anticoagulants: Secondary | ICD-10-CM | POA: Insufficient documentation

## 2022-12-30 LAB — MAGNESIUM: Magnesium: 2.4 mg/dL (ref 1.7–2.4)

## 2022-12-30 LAB — BASIC METABOLIC PANEL
Anion gap: 13 (ref 5–15)
BUN: 8 mg/dL (ref 8–23)
CO2: 18 mmol/L — ABNORMAL LOW (ref 22–32)
Calcium: 8.7 mg/dL — ABNORMAL LOW (ref 8.9–10.3)
Chloride: 103 mmol/L (ref 98–111)
Creatinine, Ser: 0.9 mg/dL (ref 0.61–1.24)
GFR, Estimated: >60 mL/min (ref >60)
Glucose, Bld: 108 mg/dL — ABNORMAL HIGH (ref 70–99)
Potassium: 4 mmol/L (ref 3.5–5.1)
Sodium: 134 mmol/L — ABNORMAL LOW (ref 135–145)

## 2022-12-30 MED ORDER — DOFETILIDE 250 MCG PO CAPS: 250.0000 ug | ORAL_CAPSULE | Freq: Two times a day (BID) | ORAL | 1 refills | Status: DC

## 2022-12-30 MED ORDER — RIVAROXABAN 20 MG PO TABS: 20.0000 mg | ORAL_TABLET | Freq: Every day | ORAL | 2 refills | Status: AC

## 2022-12-30 NOTE — Progress Notes (Signed)
Primary Care Physician: Deland Pretty, MD Primary Cardiologist: Dr [name] Primary Electrophysiologist: Dr Myles Gip Referring Physician: Dr Myles Gip   Stanley Waters is a 88 y.o. male with a history of HTN, HLD, CAD/aortic atherosclerosis, atrial flutter, atrial fibrillation who presents for follow up in the [facility]. He is s/p ablation 09/2018. He did have recurrence in the setting of RSV infection. He had more documented episodes at his visit with Dr [name] on 11/10/22. He was seen by Dr Myles Gip who recommended dofetilide loading. Patient is on Xarelto for a CHADS2VASC score of 4.  Patient is s/p hospital admission for Tikosyn loading 2/19-22. The patient's QTc did lengthen requiring a dose reduction. He then remained stable after dose reduction on 250 mcg BID. He did not require DCCV.  He was monitored until discharge on telemetry which demonstrated SB/SR, 1st degree ABVblock.   Since hospital discharge on 2/22, he has been doing well and has no specific concerns. He has remained compliant with his tikosyn and xarelto. He has not missed any doses of either medication. His main questions today are whether a couple of over the counter medications are okay to take and medication cost.    Today, he denies symptoms of palpitations, chest pain, shortness of breath, orthopnea, PND, lower extremity edema, dizziness, presyncope, syncope, snoring, daytime somnolence, bleeding, or neurologic sequela. The patient is tolerating medications without difficulties and is otherwise without complaint today.    Atrial Fibrillation Risk Factors:  he does not have symptoms or diagnosis of sleep apnea. he does not have a history of rheumatic fever.   he has a BMI of Body mass index is 27.45 kg/m.[name] Filed Weights   12/30/22 1423  Weight: 91.8 kg    Family History  Problem Relation Age of Onset   Heart failure Brother     Atrial Fibrillation Management history:  Previous  antiarrhythmic drugs: amiodarone, dofetilide  Previous cardioversions: 2013, 2014, 2019 Previous ablations: 09/2018 CHADS2VASC score: 4 Anticoagulation history: Xarelto   Past Medical History:  Diagnosis Date   Atrial flutter ([place])    Atypical chest pain    Myoview performed 07/23/11 was completely normal. Post stress EF 61%.   Bruit    Left asymptomatic bruit. Carotid Duplex 12/13/12 =mildly abnormal. *BILATERAL BULB/PROXIMAL ICAs: Demonstrated a mild amount of fibrous plaque w/no evidence od significant diameter reduction, tortuosity or other vascular abnormality.   Enlarged prostate    BPH - PSA of 6.7 this is consistant with his last PSA of 6.3   Hyperlipemia    Hypertension    Inguinal hernia    Inguinal hernia repair (x) 2 1991, 2011   Intestinal polyps 09/22/11   Colonoscopy removed a 2 mm sessile cecal polyp with a cold snare.   Measles    Mumps    Normal coronary arteries 2002   Peripheral neuropathy    Persistent atrial fibrillation (HCC)    Seasonal allergies    Skin cancer    Thrombocytopenia ([place]) 2011   Very mild with platelets of 135,000.   Tinnitus    Umbilical hernia    Vitamin D deficiency    Past Surgical History:  Procedure Laterality Date   ATRIAL FIBRILLATION ABLATION  09/20/2018   ATRIAL FIBRILLATION ABLATION N/A 09/20/2018   Procedure: ATRIAL FIBRILLATION ABLATION;  Surgeon: Thompson Grayer, MD;  Location: [place] [facility];  Service: Cardiovascular;  Laterality: N/A;   CARDIAC CATHETERIZATION  2002   "Normal cardiac catheterization"   CARDIOVERSION  07/07/2012  A fib - Cardioversion successful.   CARDIOVERSION N/A 06/30/2013   Procedure: CARDIOVERSION;  Surgeon: Sanda Klein, MD;  Location: [place];  Service: Cardiovascular;  Laterality: N/A;   CARDIOVERSION N/A 08/02/2018   Procedure: CARDIOVERSION;  Surgeon: Lelon Perla, MD;  Location: [facility] ENDOSCOPY;  Service: Cardiovascular;  Laterality: N/A;   Carotid Duplex  12/13/12   For asymptomatic  bruit. Mildly abnormal.  *BILATERAL BULB/PROXIMAL ICAs: Demonstrated a mild amount of fibrous plaque w/no evidence of significant diameter reduction, tortuosity or other vascular abnormality.    COLONOSCOPY W/ POLYPECTOMY  09/22/11   2 mm sessile cecal polyp was removed with a cold snare.   HAND SURGERY     INGUINAL HERNIA REPAIR  1991 & 2011   TEE WITHOUT CARDIOVERSION  07/07/2012   Procedure: TRANSESOPHAGEAL ECHOCARDIOGRAM (TEE);  Surgeon: Pixie Casino, MD;  Location: [facility] ENDOSCOPY;  Service: Cardiovascular;  Laterality: N/A;   TEE WITHOUT CARDIOVERSION N/A 08/02/2018   Procedure: TRANSESOPHAGEAL ECHOCARDIOGRAM (TEE);  Surgeon: Lelon Perla, MD;  Location: [facility]COPY;  Service: Cardiovascular;  Laterality: N/A;    Current Outpatient Medications  Medication Sig Dispense Refill   Calcium Citrate 250 MG TABS Take 1 tablet (250 mg total) by mouth daily with breakfast.  0   Cholecalciferol (VITAMIN D3) 2000 units TABS Take 2,000 Units by mouth daily.     Cyanocobalamin (B-12) 2500 MCG SUBL Place 1 tablet under the tongue daily.     doxazosin (CARDURA) 8 MG tablet Take 1 tablet (8 mg total) by mouth at bedtime. 90 tablet 3   lisinopril (ZESTRIL) 20 MG tablet Take 20 mg by mouth daily as needed (Blood pressure greater then 160).     multivitamin (ONE-A-DAY MEN'S) TABS tablet Take 1 tablet by mouth daily.  0   nitroGLYCERIN (NITROSTAT) 0.4 MG SL tablet PLACE 1 TABLET UNDER THE TONGUE EVERY 5 MINUTES AS NEEDED FOR CHEST PAIN (Patient taking differently: Place 0.4 mg under the tongue every 5 (five) minutes as needed for chest pain.) 25 tablet 0   polyethylene glycol (MIRALAX / GLYCOLAX) packet Take 17 g by mouth daily.     rOPINIRole (REQUIP) 0.25 MG tablet Take 0.25 mg by mouth at bedtime.      simvastatin (ZOCOR) 5 MG tablet Take 5 mg by mouth daily.      vitamin C (ASCORBIC ACID) 250 MG tablet Take 1 tablet (250 mg total) by mouth daily.     dofetilide (TIKOSYN) 250 MCG capsule Take 1  capsule (250 mcg total) by mouth 2 (two) times daily. 180 capsule 1   rivaroxaban (XARELTO) 20 MG TABS tablet Take 1 tablet (20 mg total) by mouth daily with supper. 90 tablet 2   No current facility-administered medications for this encounter.    Allergies  Allergen Reactions   Prednisone Swelling, Other (See Comments) and Hypertension    Tachycardia   Trazodone Hcl Other (See Comments)   Doxycycline Rash    Social History   Socioeconomic History   Marital status: Widowed    Spouse name: Not on file   Number of children: 2   Years of education: Not on file   Highest education level: Not on file  Occupational History    Employer: RETIRED    Comment: Road Architect  Tobacco Use   Smoking status: Former    Types: Cigarettes    Quit date: 11/03/1963    Years since quitting: 59.1   Smokeless tobacco: Never   Tobacco comments:    Former smoker 12/21/22  Vaping Use   Vaping Use: Never used  Substance and Sexual Activity   Alcohol use: No   Drug use: No   Sexual activity: Not Currently  Other Topics Concern   Not on file  Social History Narrative   Lives in [place], alone   Retired from Landscape architect   Social Determinants of Health   Financial Resource Strain: Not on file  Food Insecurity: No Food Insecurity (12/23/2022)   Hunger Vital Sign    Worried About Running Out of Food in the Last Year: Never true    Ran Out of Food in the Last Year: Never true  Transportation Needs: No Transportation Needs (12/23/2022)   PRAPARE - Hydrologist (Medical): No    Lack of Transportation (Non-Medical): No  Physical Activity: Not on file  Stress: Not on file  Social Connections: Not on file  Intimate Partner Violence: Not At Risk (12/23/2022)   Humiliation, Afraid, Rape, and Kick questionnaire    Fear of Current or Ex-Partner: No    Emotionally Abused: No    Physically Abused: No    Sexually Abused: No     ROS- All systems are reviewed  and negative except as per the HPI above.  Physical Exam: Vitals:   12/30/22 1423  BP: (!) 144/82  Pulse: 75  Weight: 91.8 kg  Height: 6' (1.829 m)    GEN- The patient is a well appearing elderly male, alert and oriented x 3 today.   Head- normocephalic, atraumatic Eyes-  Sclera clear, conjunctiva pink Ears- hearing intact Oropharynx- clear Neck- supple  Lungs- Clear to ausculation bilaterally, normal work of breathing Heart- Regular rhythm with ectopy noted, no murmurs, rubs or gallops  GI- soft, NT, ND, + BS Extremities- no clubbing, cyanosis, or edema MS- no significant deformity or atrophy Skin- no rash or lesion Psych- euthymic mood, full affect Neuro- strength and sensation are intact  Wt Readings from Last 3 Encounters:  12/30/22 91.8 kg  12/22/22 88.1 kg  12/21/22 90.9 kg    EKG today demonstrates  SR with 1st degree AV block and premature supraventricular complexes. HR 75, QT 400 ms, Qtc 447 ms  Echo 02/22/20 demonstrated   1. Left ventricular ejection fraction, by estimation, is 60 to 65%. The  left ventricle has normal function. The left ventricle has no regional  wall motion abnormalities. Left ventricular diastolic parameters were  normal.   2. Right ventricular systolic function is normal. The right ventricular  size is normal.   3. Left atrial size was moderately dilated.   4. The mitral valve is normal in structure. No evidence of mitral valve  regurgitation. No evidence of mitral stenosis.   5. The aortic valve is tricuspid. Aortic valve regurgitation is not  visualized. Mild to moderate aortic valve sclerosis/calcification is  present, without any evidence of aortic stenosis.   6. Dilated sinus and root 4.3 cm. Aortic dilatation noted. There is mild  to moderate dilatation at the level of the sinuses of Valsalva measuring  43 mm.   7. The inferior vena cava is normal in size with greater than 50%  respiratory variability, suggesting right atrial  pressure of 3 mmHg.   Epic records are reviewed at length today  CHA2DS2-VASc Score = 4  The patient's score is based upon: CHF History: 0 HTN History: 1 Diabetes History: 0 Stroke History: 0 Vascular Disease History: 1 Age Score: 2 Gender Score: 0       ASSESSMENT AND  PLAN: 1. Paroxysmal Atrial Fibrillation/atrial flutter The patient's CHA2DS2-VASc score is 4, indicating a 4.8% annual risk of stroke.   S/p afib and flutter ablation 09/2018 S/p dofetilide admission. ECG today stable will continue medication regimen without change. Labs ordered.  Continue Xarelto 20 mg daily  Advised not to take nyquil since doxylamine can interact with tikosyn.  Advised to take Delsym for cough suppression since no interaction.  Will send medication refills to [facility] at patient's request to determine if lower cost.  2. Secondary Hypercoagulable State (ICD10:  D68.69) The patient is at significant risk for stroke/thromboembolism based upon his CHA2DS2-VASc Score of 4.  Continue Rivaroxaban (Xarelto).   3. HTN Stable, no changes today.  4. CAD/aortic atherosclerosis On statin No anginal symptoms.  Follow up as scheduled with Dr. Myles Gip.   [facility] [address] [place], [place] [address] [phone] 12/30/2022 3:06 PM

## 2022-12-30 NOTE — Telephone Encounter (Signed)
Scheduled appt per 2/28 referral. Pt is aware of appt date and time. Pt is aware to arrive 15 mins prior to appt time and to bring and updated insurance card. Pt is aware of appt location.   °

## 2023-01-28 ENCOUNTER — Ambulatory Visit: Payer: Medicare HMO | Attending: Cardiovascular Disease | Admitting: Cardiovascular Disease

## 2023-01-28 ENCOUNTER — Encounter: Payer: Self-pay | Admitting: Cardiovascular Disease

## 2023-01-28 VITALS — BP 130/78 | HR 62 | Ht 72.0 in | Wt 203.0 lb

## 2023-01-28 DIAGNOSIS — I480 Paroxysmal atrial fibrillation: Secondary | ICD-10-CM

## 2023-01-28 NOTE — Patient Instructions (Signed)
Medication Instructions:  Your physician recommends that you continue on your current medications as directed. Please refer to the Current Medication list given to you today.  *If you need a refill on your cardiac medications before your next appointment, please call your pharmacy*  Follow-Up: At [facility], you and your health needs are our priority.  As part of our continuing mission to provide you with exceptional heart care, we have created designated Provider Care Teams.  These Care Teams include your primary Cardiologist (physician) and Advanced Practice Providers (APPs -  Physician Assistants and Nurse Practitioners) who all work together to provide you with the care you need, when you need it.  Your next appointment:   6 month(s)  Provider:   You will see one of the following Advanced Practice Providers on your designated Care Team:   Renee Ursuy, PA-C Michael "Andy" Tillery, PA-C Suzann Riddle, NP   

## 2023-01-28 NOTE — Progress Notes (Signed)
PCP:  Deland Pretty, MD Primary Cardiologist: Quay Burow, MD Electrophysiologist: Melida Quitter, MD   Stanley Waters is a 88 y.o. male seen today for follow-up regarding AF.  He had a transient episode of chest pain a few weeks ago.  It did not improve immediately with nitroglycerin.  The symptoms resolved abruptly prior to EMS arrival.  The symptoms were very distinct from his atrial fibrillation symptoms which include palpitations, fatigue, and weakness. He has not had any palpitations recently.  He returns today after having an AF exacerbation in the setting of RSV pneumonia. He had a severe cough, fevers, and could not sleep for about 48 hours. He sought treatment at the ER. In the wake of this respiratory infection, he noticed irregular palpitations and a slight increase in his heart rate with readings running predominantly 80-100 bpm. There was one outlier with HR of 125 bpm.  12/08/2022 Since last seeing me, he was seen in clinic by Dr. [name]. He was noted to be in AF at that time and symptomatic with fatigue.  Past Medical History:  Diagnosis Date   Atrial flutter ([place])    Atypical chest pain    Myoview performed 07/23/11 was completely normal. Post stress EF 61%.   Bruit    Left asymptomatic bruit. Carotid Duplex 12/13/12 =mildly abnormal. *BILATERAL BULB/PROXIMAL ICAs: Demonstrated a mild amount of fibrous plaque w/no evidence od significant diameter reduction, tortuosity or other vascular abnormality.   Enlarged prostate    BPH - PSA of 6.7 this is consistant with his last PSA of 6.3   Hyperlipemia    Hypertension    Inguinal hernia    Inguinal hernia repair (x) 2 1991, 2011   Intestinal polyps 09/22/11   Colonoscopy removed a 2 mm sessile cecal polyp with a cold snare.   Measles    Mumps    Normal coronary arteries 2002   Peripheral neuropathy    Persistent atrial fibrillation (HCC)    Seasonal allergies    Skin cancer    Thrombocytopenia ([place]) 2011   Very  mild with platelets of 135,000.   Tinnitus    Umbilical hernia    Vitamin D deficiency    Past Surgical History:  Procedure Laterality Date   ATRIAL FIBRILLATION ABLATION  09/20/2018   ATRIAL FIBRILLATION ABLATION N/A 09/20/2018   Procedure: ATRIAL FIBRILLATION ABLATION;  Surgeon: Thompson Grayer, MD;  Location: [place] [facility];  Service: Cardiovascular;  Laterality: N/A;   CARDIAC CATHETERIZATION  2002   "Normal cardiac catheterization"   CARDIOVERSION  07/07/2012   A fib - Cardioversion successful.   CARDIOVERSION N/A 06/30/2013   Procedure: CARDIOVERSION;  Surgeon: Sanda Klein, MD;  Location: [place];  Service: Cardiovascular;  Laterality: N/A;   CARDIOVERSION N/A 08/02/2018   Procedure: CARDIOVERSION;  Surgeon: Lelon Perla, MD;  Location: [facility] ENDOSCOPY;  Service: Cardiovascular;  Laterality: N/A;   Carotid Duplex  12/13/12   For asymptomatic bruit. Mildly abnormal.  *BILATERAL BULB/PROXIMAL ICAs: Demonstrated a mild amount of fibrous plaque w/no evidence of significant diameter reduction, tortuosity or other vascular abnormality.    COLONOSCOPY W/ POLYPECTOMY  09/22/11   2 mm sessile cecal polyp was removed with a cold snare.   HAND SURGERY     INGUINAL HERNIA REPAIR  1991 & 2011   TEE WITHOUT CARDIOVERSION  07/07/2012   Procedure: TRANSESOPHAGEAL ECHOCARDIOGRAM (TEE);  Surgeon: Pixie Casino, MD;  Location: [facility] ENDOSCOPY;  Service: Cardiovascular;  Laterality: N/A;   TEE WITHOUT CARDIOVERSION N/A  08/02/2018   Procedure: TRANSESOPHAGEAL ECHOCARDIOGRAM (TEE);  Surgeon: Lelon Perla, MD;  Location: [facility] ENDOSCOPY;  Service: Cardiovascular;  Laterality: N/A;    Current Outpatient Medications  Medication Sig Dispense Refill   Calcium Citrate 250 MG TABS Take 1 tablet (250 mg total) by mouth daily with breakfast.  0   Cholecalciferol (VITAMIN D3) 2000 units TABS Take 2,000 Units by mouth daily.     Cyanocobalamin (B-12) 2500 MCG SUBL Place 1 tablet under the tongue daily.      dofetilide (TIKOSYN) 250 MCG capsule Take 1 capsule (250 mcg total) by mouth 2 (two) times daily. 180 capsule 1   doxazosin (CARDURA) 8 MG tablet Take 1 tablet (8 mg total) by mouth at bedtime. 90 tablet 3   lisinopril (ZESTRIL) 20 MG tablet Take 20 mg by mouth daily as needed (Blood pressure greater then 160).     multivitamin (ONE-A-DAY MEN'S) TABS tablet Take 1 tablet by mouth daily.  0   nitroGLYCERIN (NITROSTAT) 0.4 MG SL tablet PLACE 1 TABLET UNDER THE TONGUE EVERY 5 MINUTES AS NEEDED FOR CHEST PAIN (Patient taking differently: Place 0.4 mg under the tongue every 5 (five) minutes as needed for chest pain.) 25 tablet 0   polyethylene glycol (MIRALAX / GLYCOLAX) packet Take 17 g by mouth daily.     rivaroxaban (XARELTO) 20 MG TABS tablet Take 1 tablet (20 mg total) by mouth daily with supper. 90 tablet 2   rOPINIRole (REQUIP) 0.25 MG tablet Take 0.25 mg by mouth at bedtime.      simvastatin (ZOCOR) 5 MG tablet Take 5 mg by mouth daily.      vitamin C (ASCORBIC ACID) 250 MG tablet Take 1 tablet (250 mg total) by mouth daily.     No current facility-administered medications for this visit.    Allergies  Allergen Reactions   Prednisone Swelling, Other (See Comments) and Hypertension    Tachycardia   Trazodone Hcl Other (See Comments)   Doxycycline Rash    Social History   Socioeconomic History   Marital status: Widowed    Spouse name: Not on file   Number of children: 2   Years of education: Not on file   Highest education level: Not on file  Occupational History    Employer: RETIRED    Comment: Road Architect  Tobacco Use   Smoking status: Former    Types: Cigarettes    Quit date: 11/03/1963    Years since quitting: 59.2   Smokeless tobacco: Never   Tobacco comments:    Former smoker 12/21/22  Vaping Use   Vaping Use: Never used  Substance and Sexual Activity   Alcohol use: No   Drug use: No   Sexual activity: Not Currently  Other Topics Concern   Not on file   Social History Narrative   Lives in [place], alone   Retired from Landscape architect   Social Determinants of [place] Strain: Not on file  Food Insecurity: No [place] (12/23/2022)   Hunger Vital Sign    Worried About Running Out of Food in the Last Year: Never true    [place] in the Last Year: Never true  Transportation Needs: No Transportation Needs (12/23/2022)   PRAPARE - Hydrologist (Medical): No    Lack of Transportation (Non-Medical): No  Physical Activity: Not on file  Stress: Not on file  Social Connections: Not on file  Intimate Partner Violence: Not At  Risk (12/23/2022)   Humiliation, Afraid, Rape, and Kick questionnaire    Fear of Current or Ex-Partner: No    Emotionally Abused: No    Physically Abused: No    Sexually Abused: No     Review of Systems: All other systems reviewed and are otherwise negative except as noted above.  Physical Exam: Vitals:   01/28/23 1036  BP: 130/78  Pulse: 62  SpO2: 98%  Weight: 203 lb (92.1 kg)  Height: 6' (1.829 m)     Gen: Appears comfortable, well-nourished CV: RRR, no dependent edema Pulm: breathing easily   EKG is ordered. Personal review of EKG from today shows NSR   ECG 1/9 shows atrial fibrillation  Assessment and Plan:  1. Paroxysmal atrial fibrillation mitral annual flutter S/p ablation 09/2018 - with documented recurrence corresponding with symptoms. Now on Tikosyn Continue Xarelto for CHA2DS2-VASc of at least 3. EKG today shows frequent  PACs, which are consistent with his monitor last year Was previously on amiodarone prior to ablation  NYHA I symptoms. He "weed eats" and loads wood without SOB. Unlikely his non specific symptoms are from ectopy.   2. HTN Controlled. I am not going to make any changes.  3. High risk medication On Tikosyn, Qtc today 466 Follow-up biannually, can see APP in 6 months  Melida Quitter, MD   01/28/23 11:06 AM

## 2023-02-03 DIAGNOSIS — R351 Nocturia: Secondary | ICD-10-CM | POA: Diagnosis not present

## 2023-02-03 DIAGNOSIS — R3915 Urgency of urination: Secondary | ICD-10-CM | POA: Diagnosis not present

## 2023-02-03 DIAGNOSIS — N401 Enlarged prostate with lower urinary tract symptoms: Secondary | ICD-10-CM | POA: Diagnosis not present

## 2023-02-15 ENCOUNTER — Inpatient Hospital Stay: Payer: Medicare HMO | Attending: Hematology | Admitting: Hematology

## 2023-02-15 ENCOUNTER — Inpatient Hospital Stay: Payer: Medicare HMO

## 2023-02-15 ENCOUNTER — Other Ambulatory Visit: Payer: Self-pay

## 2023-02-15 VITALS — BP 138/66 | HR 64 | Temp 97.9°F | Resp 18 | Wt 202.1 lb

## 2023-02-15 DIAGNOSIS — D696 Thrombocytopenia, unspecified: Secondary | ICD-10-CM | POA: Insufficient documentation

## 2023-02-15 DIAGNOSIS — N400 Enlarged prostate without lower urinary tract symptoms: Secondary | ICD-10-CM | POA: Insufficient documentation

## 2023-02-15 DIAGNOSIS — D734 Cyst of spleen: Secondary | ICD-10-CM | POA: Insufficient documentation

## 2023-02-15 DIAGNOSIS — Z7901 Long term (current) use of anticoagulants: Secondary | ICD-10-CM | POA: Insufficient documentation

## 2023-02-15 DIAGNOSIS — R161 Splenomegaly, not elsewhere classified: Secondary | ICD-10-CM | POA: Diagnosis not present

## 2023-02-15 DIAGNOSIS — Z87891 Personal history of nicotine dependence: Secondary | ICD-10-CM | POA: Diagnosis not present

## 2023-02-15 DIAGNOSIS — I10 Essential (primary) hypertension: Secondary | ICD-10-CM | POA: Insufficient documentation

## 2023-02-15 DIAGNOSIS — I4891 Unspecified atrial fibrillation: Secondary | ICD-10-CM | POA: Insufficient documentation

## 2023-02-15 LAB — CBC WITH DIFFERENTIAL (CANCER CENTER ONLY)
Abs Immature Granulocytes: 0.02 10*3/uL (ref 0.00–0.07)
Basophils Absolute: 0 10*3/uL (ref 0.0–0.1)
Basophils Relative: 1 %
Eosinophils Absolute: 0.1 10*3/uL (ref 0.0–0.5)
Eosinophils Relative: 3 %
HCT: 42.4 % (ref 39.0–52.0)
Hemoglobin: 14.8 g/dL (ref 13.0–17.0)
Immature Granulocytes: 0 %
Lymphocytes Relative: 14 %
Lymphs Abs: 0.7 10*3/uL (ref 0.7–4.0)
MCH: 32.2 pg (ref 26.0–34.0)
MCHC: 34.9 g/dL (ref 30.0–36.0)
MCV: 92.4 fL (ref 80.0–100.0)
Monocytes Absolute: 0.4 10*3/uL (ref 0.1–1.0)
Monocytes Relative: 9 %
Neutro Abs: 3.3 10*3/uL (ref 1.7–7.7)
Neutrophils Relative %: 73 %
Platelet Count: 127 10*3/uL — ABNORMAL LOW (ref 150–400)
RBC: 4.59 MIL/uL (ref 4.22–5.81)
RDW: 12.2 % (ref 11.5–15.5)
WBC Count: 4.5 10*3/uL (ref 4.0–10.5)
nRBC: 0 % (ref 0.0–0.2)

## 2023-02-15 LAB — CMP (CANCER CENTER ONLY)
ALT: 9 U/L (ref 0–44)
AST: 16 U/L (ref 15–41)
Albumin: 4.1 g/dL (ref 3.5–5.0)
Alkaline Phosphatase: 48 U/L (ref 38–126)
Anion gap: 5 (ref 5–15)
BUN: 15 mg/dL (ref 8–23)
CO2: 28 mmol/L (ref 22–32)
Calcium: 9.3 mg/dL (ref 8.9–10.3)
Chloride: 103 mmol/L (ref 98–111)
Creatinine: 0.97 mg/dL (ref 0.61–1.24)
GFR, Estimated: >60 mL/min (ref >60)
Glucose, Bld: 93 mg/dL (ref 70–99)
Potassium: 4.4 mmol/L (ref 3.5–5.1)
Sodium: 136 mmol/L (ref 135–145)
Total Bilirubin: 0.7 mg/dL (ref 0.3–1.2)
Total Protein: 7 g/dL (ref 6.5–8.1)

## 2023-02-15 LAB — LACTATE DEHYDROGENASE: LDH: 116 U/L (ref 98–192)

## 2023-02-15 NOTE — Progress Notes (Signed)
HEMATOLOGY/ONCOLOGY CLINIC NOTE  Date of Service: 02/15/2023  Patient Care Team: Merri Brunette, MD as PCP - General (Internal Medicine) Runell Gess, MD as PCP - Cardiology (Cardiology) Mealor, Roberts Gaudy, MD as PCP - Electrophysiology (Cardiology) Barrett, Joline Salt, PA-C as Physician Assistant (Cardiology)  CHIEF COMPLAINTS/PURPOSE OF CONSULTATION:  Splenic cyst and thrombocytopenia  HISTORY OF PRESENTING ILLNESS:   Stanley Waters is a wonderful 88 y.o. male who has been referred to [place] by Dr. Merri Brunette, MD for evaluation and management of splenic cyst and thrombocytopenia.  The pt has a history of atrial flutter,persistent atrial fibrillation, atypical chest pain, BPH, hypertension, dyslipidemia, chronic mild thrombocytopenia noted from 2011.  Patient had groin pain with a history of inguinal hernia repair and therefore had a CT of the abdomen and pelvis on 02/13/2021 which showed no acute intra-abdominal pathology but showed an incidental findings of Low-density structure involving the medial inferior spleen measures 3.0 cm. This has water attenuation. This splenic lesion was not present on the exam from 2008. This structure looks similar on the delayed images and this could represent a cyst. There is an additional hypodensity in the midportion of the spleen which was present in 2008. No perisplenic stranding or edema.  Lab results 03/03/2021 of CBC w/diff and CMP is as follows: all values are WNL except for Plt of 118K.  On review of systems, pt reports no fevers no chills no night sweats no unexpected weight loss no new fatigue.  No left upper quadrant abdominal pain.  No other acute new symptoms.. Notes he had COVID infection in jan 2020  Never had blood transfusions  INTERVAL HISTORY:  Stanley Waters is a wonderful 88 y.o. male who has been referred to [place] by Dr. Merri Brunette, MD for evaluation and management of spleniomegaly and thrombocytopenia. I had a phone visit  with the patient on 06/25/2021 and he was doing well overall. He has been referred back to [place] due to splenomegaly on CT scan in February.   Patient notes he has not been doing well overall since our last visit. Patient reports he was diagnosed with influenza and RSV in December 2023 and January 2024. He notes he has atrial fibrillation and he regularly follows up with his cardiologist. He has been started on Tikosyn 250 mcg, which has been controlling his Afib. However, patient complains of increased fatigue since he started [place].   He reports that he gets bruised easily. He takes Xarelto 20 mg.   Patient denies drinking alcohol. Patient denies fever, chills, night sweats, abnormal bowel movement, unexpected weight loss, back pain, chest pain, abdominal pain, or leg swelling.   Patient reports he has enlarged prostate and he has been taking Doxazosin 8 mg.  He notes that hs enlarged prostate causes him to wake up more often in middle of night. He regularly follows up with his Urologist, Dr. Ezzard Standing.   Patient takes vitamin-D supplement, vitamin-C supplement, B-12 supplement, and multi-vitamin.    MEDICAL HISTORY:  Past Medical History:  Diagnosis Date   Atrial flutter (HCC)    Atypical chest pain    Myoview performed 07/23/11 was completely normal. Post stress EF 61%.   Bruit    Left asymptomatic bruit. Carotid Duplex 12/13/12 =mildly abnormal. *BILATERAL BULB/PROXIMAL ICAs: Demonstrated a mild amount of fibrous plaque w/no evidence od significant diameter reduction, tortuosity or other vascular abnormality.   Enlarged prostate    BPH - PSA of 6.7 this is consistant with his last PSA  of 6.3   Hyperlipemia    Hypertension    Inguinal hernia    Inguinal hernia repair (x) 2 1991, 2011   Intestinal polyps 09/22/11   Colonoscopy removed a 2 mm sessile cecal polyp with a cold snare.   Measles    Mumps    Normal coronary arteries 2002   Peripheral neuropathy    Persistent atrial  fibrillation (HCC)    Seasonal allergies    Skin cancer    Thrombocytopenia (HCC) 2011   Very mild with platelets of 135,000.   Tinnitus    Umbilical hernia    Vitamin D deficiency     SURGICAL HISTORY: Past Surgical History:  Procedure Laterality Date   ATRIAL FIBRILLATION ABLATION  09/20/2018   ATRIAL FIBRILLATION ABLATION N/A 09/20/2018   Procedure: ATRIAL FIBRILLATION ABLATION;  Surgeon: Hillis Range, MD;  Location: [facility] INVASIVE CV LAB;  Service: Cardiovascular;  Laterality: N/A;   CARDIAC CATHETERIZATION  2002   "Normal cardiac catheterization"   CARDIOVERSION  07/07/2012   A fib - Cardioversion successful.   CARDIOVERSION N/A 06/30/2013   Procedure: CARDIOVERSION;  Surgeon: Thurmon Fair, MD;  Location: [facility] OR;  Service: Cardiovascular;  Laterality: N/A;   CARDIOVERSION N/A 08/02/2018   Procedure: CARDIOVERSION;  Surgeon: Lewayne Bunting, MD;  Location: [facility] ENDOSCOPY;  Service: Cardiovascular;  Laterality: N/A;   Carotid Duplex  12/13/12   For asymptomatic bruit. Mildly abnormal.  *BILATERAL BULB/PROXIMAL ICAs: Demonstrated a mild amount of fibrous plaque w/no evidence of significant diameter reduction, tortuosity or other vascular abnormality.    COLONOSCOPY W/ POLYPECTOMY  09/22/11   2 mm sessile cecal polyp was removed with a cold snare.   HAND SURGERY     INGUINAL HERNIA REPAIR  1991 & 2011   TEE WITHOUT CARDIOVERSION  07/07/2012   Procedure: TRANSESOPHAGEAL ECHOCARDIOGRAM (TEE);  Surgeon: Chrystie Nose, MD;  [facility]COPY;  Service: Cardiovascular;  Laterality: N/A;   TEE WITHOUT CARDIOVERSION N/A 08/02/2018   Procedure: TRANSESOPHAGEAL ECHOCARDIOGRAM (TEE);  Surgeon: Lewayne Bunting, MD;  Location: [facility] ENDOSCOPY;  Service: Cardiovascular;  Laterality: N/A;    SOCIAL HISTORY: Social History   Socioeconomic History   Marital status: Widowed    Spouse name: Not on file   Number of children: 2   Years of education: Not on file   Highest education level: Not  on file  Occupational History    Employer: RETIRED    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  Tobacco Use   Smoking status: Former    Types: Cigarettes    Quit date: 11/03/1963    Years since quitting: 59.3   Smokeless tobacco: Never   Tobacco comments:    Former smoker 12/21/22  Vaping Use   Vaping Use: Never used  Substance and Sexual Activity   Alcohol use: No   Drug use: No   Sexual activity: Not Currently  Other Topics Concern   Not on file  Social History Narrative   Lives in [place], alone   Retired from Human resources officer   Social Determinants of Health   Financial Resource Strain: Not on file  Food Insecurity: No Food Insecurity (12/23/2022)   Hunger Vital Sign    Worried About Running Out of Food in the Last Year: Never true    Ran Out of Food in the Last Year: Never true  Transportation Needs: No Transportation Needs (12/23/2022)   PRAPARE - Administrator, Civil Service (Medical): No    Lack of Transportation (Non-Medical): No  Physical Activity:  Not on file  Stress: Not on file  Social Connections: Not on file  Intimate Partner Violence: Not At Risk (12/23/2022)   Humiliation, Afraid, Rape, and Kick questionnaire    Fear of Current or Ex-Partner: No    Emotionally Abused: No    Physically Abused: No    Sexually Abused: No    FAMILY HISTORY: Family History  Problem Relation Age of Onset   Heart failure Brother     ALLERGIES:  is allergic to prednisone, trazodone hcl, and doxycycline.  MEDICATIONS:  Current Outpatient Medications  Medication Sig Dispense Refill   Calcium Citrate 250 MG TABS Take 1 tablet (250 mg total) by mouth daily with breakfast.  0   Cholecalciferol (VITAMIN D3) 2000 units TABS Take 2,000 Units by mouth daily.     Cyanocobalamin (B-12) 2500 MCG SUBL Place 1 tablet under the tongue daily.     dofetilide (TIKOSYN) 250 MCG capsule Take 1 capsule (250 mcg total) by mouth 2 (two) times daily. 180 capsule 1   doxazosin (CARDURA) 8 MG  tablet Take 1 tablet (8 mg total) by mouth at bedtime. 90 tablet 3   lisinopril (ZESTRIL) 20 MG tablet Take 20 mg by mouth daily as needed (Blood pressure greater then 160).     multivitamin (ONE-A-DAY MEN'S) TABS tablet Take 1 tablet by mouth daily.  0   nitroGLYCERIN (NITROSTAT) 0.4 MG SL tablet PLACE 1 TABLET UNDER THE TONGUE EVERY 5 MINUTES AS NEEDED FOR CHEST PAIN (Patient taking differently: Place 0.4 mg under the tongue every 5 (five) minutes as needed for chest pain.) 25 tablet 0   polyethylene glycol (MIRALAX / GLYCOLAX) packet Take 17 g by mouth daily.     rivaroxaban (XARELTO) 20 MG TABS tablet Take 1 tablet (20 mg total) by mouth daily with supper. 90 tablet 2   rOPINIRole (REQUIP) 0.25 MG tablet Take 0.25 mg by mouth at bedtime.      simvastatin (ZOCOR) 5 MG tablet Take 5 mg by mouth daily.      vitamin C (ASCORBIC ACID) 250 MG tablet Take 1 tablet (250 mg total) by mouth daily.     No current facility-administered medications for this visit.    REVIEW OF SYSTEMS:    10 Point review of Systems was done is negative except as noted above.  PHYSICAL EXAMINATION: ECOG PERFORMANCE STATUS: 1 - Symptomatic but completely ambulatory .BP 138/66   Pulse 64   Temp 97.9 F (36.6 C)   Resp 18   Wt 202 lb 1.6 oz (91.7 kg)   SpO2 99%   BMI 27.41 kg/m  . GENERAL:alert, in no acute distress and comfortable SKIN: no acute rashes, no significant lesions EYES: conjunctiva are pink and non-injected, sclera anicteric OROPHARYNX: MMM, no exudates, no oropharyngeal erythema or ulceration NECK: supple, no JVD LYMPH:  no palpable lymphadenopathy in the cervical, axillary or inguinal regions LUNGS: clear to auscultation b/l with normal respiratory effort HEART: regular rate & rhythm ABDOMEN:  normoactive bowel sounds , non tender, splenomegaly 2-3 cms below left costal margin. Extremity: no pedal edema PSYCH: alert & oriented x 3 with fluent speech NEURO: no focal motor/sensory  deficits   LABORATORY DATA:  I have reviewed the data as listed .    Latest Ref Rng & Units 02/15/2023    1:13 PM 07/17/2022   12:51 PM 08/11/2021   12:43 PM  CBC  WBC 4.0 - 10.5 K/uL 4.5  7.6  4.7   Hemoglobin 13.0 - 17.0 g/dL 14.8  16.2  15.4   Hematocrit 39.0 - 52.0 % 42.4  47.4  44.5   Platelets 150 - 400 K/uL 127  122  114    .    Latest Ref Rng & Units 02/15/2023    1:13 PM 12/30/2022    2:20 PM 12/24/2022    2:48 AM  CMP  Glucose 70 - 99 mg/dL 93  244  99   BUN 8 - 23 mg/dL 15  8  19    Creatinine 0.61 - 1.24 mg/dL 0.10  2.72  5.36   Sodium 135 - 145 mmol/L 136  134  135   Potassium 3.5 - 5.1 mmol/L 4.4  4.0  4.2   Chloride 98 - 111 mmol/L 103  103  100   CO2 22 - 32 mmol/L 28  18  25    Calcium 8.9 - 10.3 mg/dL 9.3  8.7  8.9   Total Protein 6.5 - 8.1 g/dL 7.0     Total Bilirubin 0.3 - 1.2 mg/dL 0.7     Alkaline Phos 38 - 126 U/L 48     AST 15 - 41 U/L 16     ALT 0 - 44 U/L 9      . Lab Results  Component Value Date   LDH 116 02/15/2023     RADIOGRAPHIC STUDIES: I have personally reviewed the radiological images as listed and agreed with the findings in the report. No results found.  ASSESSMENT & PLAN:   88 year old patient with multiple medical comorbidities with  #1 Chronic mild thrombocytopenia from at least 2011. Recent platelet with primary care physician or down to 118k On repeat labs today depressions platelets have improved to 133k Chronic nonprogressive thrombocytopenia suggests this could be due to one of his chronic medications. Unlikely to be related to his incidentally noted splenic cysts. PLAN: -Discussed his lab results and CT imaging findings in details. -Repeat labs done today show CBC with a very mild thrombocytopenia of 133k this would represent isolated thrombocytopenia of undetermined significance. -LDH within normal limits at 147 -Hepatitis C testing negative. -No additional work-up recommended for his mild chronic thrombocytopenia  at this time. -Continue monitoring labs every 6 months with primary care physician and consult [place] if platelets less than 75k -Watch for use of medications that could worsen thrombocytopenia.  #2 incidentally noted Splenomegaly and  low-density splenic lesions consistent with splenic cysts.  One of the cysts was noted in 2008 and the other 1 is apparently new since 2008.  No inflammation or edema around the lesion to suggest splenic abscesses. No fevers chills or night sweats to suggest an acute infectious process or lymphoproliferative disorder. No overt splenomegaly. Given the patient's history of A. fib cannot rule out small old microinfarcts. Currently patient is having no pain and no symptoms associated with these findings.  PLAN: -Discussed that the CT scan in February did show Splenomegaly, but the spleen size has been stable.  -Discussed the next option of blood workup during this visit. Patient agrees to lab workup. LDH wnl -flow cytometry with no monoclonal B cell or abnormal T cell population, Labs today including MPN markers Phone visit with Dr Candise Che in 2 weeks  . Orders Placed This Encounter  Procedures   CBC with Differential (Cancer Center Only)    Standing Status:   Future    Number of Occurrences:   1    Standing Expiration Date:   02/15/2024   CMP (Cancer Center only)    Standing Status:  Future    Number of Occurrences:   1    Standing Expiration Date:   02/15/2024   Lactate dehydrogenase    Standing Status:   Future    Number of Occurrences:   1    Standing Expiration Date:   02/15/2024   Flow Cytometry, Peripheral Blood (Oncology)    Patient with splenomegaly evaluate for possible splenic marginal zone/splenic lymphoma.    Standing Status:   Future    Number of Occurrences:   1    Standing Expiration Date:   02/15/2024   JAK2 (including [id number] and Exon 12), MPL, and CALR-Next Generation Sequencing    Standing Status:   Future    Number of Occurrences:   1     Standing Expiration Date:   02/15/2024   BCR ABL1 FISH ([facility])    Standing Status:   Future    Number of Occurrences:   1    Standing Expiration Date:   02/15/2024    FOLLOW-UP:  Labs today Phone visit with Dr Candise Che in 2 weeks  The total time spent in the appointment was 25 minutes* .  All of the patient's questions were answered with apparent satisfaction. The patient knows to call the clinic with any problems, questions or concerns.   Wyvonnia Lora MD MS AAHIVMS [facility] Hematology/Oncology Physician [facility]  .*Total Encounter Time as defined by the Centers for Medicare and Medicaid Services includes, in addition to the face-to-face time of a patient visit (documented in the note above) non-face-to-face time: obtaining and reviewing outside history, ordering and reviewing medications, tests or procedures, care coordination (communications with other health care professionals or caregivers) and documentation in the medical record.   I, Ok Edwards, am acting as a Neurosurgeon for Wyvonnia Lora, MD. .I have reviewed the above documentation for accuracy and completeness, and I agree with the above. Johney Maine MD

## 2023-02-16 LAB — SURGICAL PATHOLOGY

## 2023-02-18 LAB — FLOW CYTOMETRY

## 2023-02-22 LAB — BCR ABL1 FISH (GENPATH)

## 2023-02-22 LAB — JAK2 (INCLUDING V617F AND EXON 12), MPL,& CALR-NEXT GEN SEQ

## 2023-02-25 ENCOUNTER — Ambulatory Visit (HOSPITAL_COMMUNITY): Payer: Medicare HMO | Attending: Internal Medicine

## 2023-02-25 DIAGNOSIS — I480 Paroxysmal atrial fibrillation: Secondary | ICD-10-CM

## 2023-02-25 DIAGNOSIS — I10 Essential (primary) hypertension: Secondary | ICD-10-CM

## 2023-02-25 DIAGNOSIS — E782 Mixed hyperlipidemia: Secondary | ICD-10-CM

## 2023-02-25 DIAGNOSIS — I7121 Aneurysm of the ascending aorta, without rupture: Secondary | ICD-10-CM | POA: Insufficient documentation

## 2023-02-26 LAB — ECHOCARDIOGRAM COMPLETE
Area-P 1/2: 3.45 cm2
S' Lateral: 2.2 cm

## 2023-03-01 ENCOUNTER — Other Ambulatory Visit: Payer: Self-pay

## 2023-03-01 DIAGNOSIS — I7121 Aneurysm of the ascending aorta, without rupture: Secondary | ICD-10-CM

## 2023-03-01 DIAGNOSIS — I10 Essential (primary) hypertension: Secondary | ICD-10-CM

## 2023-03-01 DIAGNOSIS — E782 Mixed hyperlipidemia: Secondary | ICD-10-CM

## 2023-03-02 DIAGNOSIS — E785 Hyperlipidemia, unspecified: Secondary | ICD-10-CM | POA: Diagnosis not present

## 2023-03-02 DIAGNOSIS — I10 Essential (primary) hypertension: Secondary | ICD-10-CM | POA: Diagnosis not present

## 2023-03-02 DIAGNOSIS — I4891 Unspecified atrial fibrillation: Secondary | ICD-10-CM | POA: Diagnosis not present

## 2023-03-02 NOTE — Progress Notes (Signed)
HEMATOLOGY/ONCOLOGY PHONE VISIT NOTE  Date of Service: 03/03/23  Patient Care Team: Merri Brunette, MD as PCP - General (Internal Medicine) Runell Gess, MD as PCP - Cardiology (Cardiology) Mealor, Roberts Gaudy, MD as PCP - Electrophysiology (Cardiology) Barrett, Joline Salt, PA-C as Physician Assistant (Cardiology)  CHIEF COMPLAINTS/PURPOSE OF CONSULTATION:  Evaluation and management of splenic cyst and thrombocytopenia  HISTORY OF PRESENTING ILLNESS:   Stanley Waters is a wonderful 88 y.o. male who has been referred to [place] by Dr. Merri Brunette, MD for evaluation and management of splenic cyst and thrombocytopenia.  The pt has a history of atrial flutter,persistent atrial fibrillation, atypical chest pain, BPH, hypertension, dyslipidemia, chronic mild thrombocytopenia noted from 2011.  Patient had groin pain with a history of inguinal hernia repair and therefore had a CT of the abdomen and pelvis on 02/13/2021 which showed no acute intra-abdominal pathology but showed an incidental findings of Low-density structure involving the medial inferior spleen measures 3.0 cm. This has water attenuation. This splenic lesion was not present on the exam from 2008. This structure looks similar on the delayed images and this could represent a cyst. There is an additional hypodensity in the midportion of the spleen which was present in 2008. No perisplenic stranding or edema.  Lab results 03/03/2021 of CBC w/diff and CMP is as follows: all values are WNL except for Plt of 118K.  On review of systems, pt reports no fevers no chills no night sweats no unexpected weight loss no new fatigue.  No left upper quadrant abdominal pain.  No other acute new symptoms.. Notes he had COVID infection in jan 2020  Never had blood transfusions  INTERVAL HISTORY:  Stanley Waters is a wonderful 88 y.o. male here for evaluation and management of spleniomegaly and thrombocytopenia.   Patient was last seen by me  on 02/15/2023 and reported being infected with influenza and RSV in December 2023 and January 2024. He complained of increased fatigue, easily bruising, and an enlarged prostate which causes sleep disturbances.   I connected with Stanley Waters on 03/03/2023 at  3:30 PM EDT by telephone visit and verified that I am speaking with the correct person using two identifiers.   I discussed the limitations, risks, security and privacy concerns of performing an evaluation and management service by telemedicine and the availability of in-person appointments. I also discussed with the patient that there may be a patient responsible charge related to this service. The patient expressed understanding and agreed to proceed.   Other persons participating in the visit and their role in the encounter: none   Patient's location: home  Provider's location: [facility]   Chief Complaint: Evaluation and management of splenic cyst and thrombocytopenia   Today, he complains of frequent fatigue. He endorses normal p.o. intake and reports a couple near-syncope episodes related to dehydration. He complains of restless leg symptoms as well as urinary frequency, needing to use the restroom up to 5-6 times a night.  MEDICAL HISTORY:  Past Medical History:  Diagnosis Date   Atrial flutter (HCC)    Atypical chest pain    Myoview performed 07/23/11 was completely normal. Post stress EF 61%.   Bruit    Left asymptomatic bruit. Carotid Duplex 12/13/12 =mildly abnormal. *BILATERAL BULB/PROXIMAL ICAs: Demonstrated a mild amount of fibrous plaque w/no evidence od significant diameter reduction, tortuosity or other vascular abnormality.   Enlarged prostate    BPH - PSA of 6.7 this is consistant with his last PSA  of 6.3   Hyperlipemia    Hypertension    Inguinal hernia    Inguinal hernia repair (x) 2 1991, 2011   Intestinal polyps 09/22/11   Colonoscopy removed a 2 mm sessile cecal polyp with a cold snare.   Measles    Mumps     Normal coronary arteries 2002   Peripheral neuropathy    Persistent atrial fibrillation (HCC)    Seasonal allergies    Skin cancer    Thrombocytopenia (HCC) 2011   Very mild with platelets of 135,000.   Tinnitus    Umbilical hernia    Vitamin D deficiency     SURGICAL HISTORY: Past Surgical History:  Procedure Laterality Date   ATRIAL FIBRILLATION ABLATION  09/20/2018   ATRIAL FIBRILLATION ABLATION N/A 09/20/2018   Procedure: ATRIAL FIBRILLATION ABLATION;  Surgeon: Hillis Range, MD;  Location: [facility] INVASIVE CV LAB;  Service: Cardiovascular;  Laterality: N/A;   CARDIAC CATHETERIZATION  2002   "Normal cardiac catheterization"   CARDIOVERSION  07/07/2012   A fib - Cardioversion successful.   CARDIOVERSION N/A 06/30/2013   Procedure: CARDIOVERSION;  Surgeon: Thurmon Fair, MD;  Location: [facility] OR;  Service: Cardiovascular;  Laterality: N/A;   CARDIOVERSION N/A 08/02/2018   Procedure: CARDIOVERSION;  Surgeon: Lewayne Bunting, MD;  Location: [facility] ENDOSCOPY;  Service: Cardiovascular;  Laterality: N/A;   Carotid Duplex  12/13/12   For asymptomatic bruit. Mildly abnormal.  *BILATERAL BULB/PROXIMAL ICAs: Demonstrated a mild amount of fibrous plaque w/no evidence of significant diameter reduction, tortuosity or other vascular abnormality.    COLONOSCOPY W/ POLYPECTOMY  09/22/11   2 mm sessile cecal polyp was removed with a cold snare.   HAND SURGERY     INGUINAL HERNIA REPAIR  1991 & 2011   TEE WITHOUT CARDIOVERSION  07/07/2012   Procedure: TRANSESOPHAGEAL ECHOCARDIOGRAM (TEE);  Surgeon: Chrystie Nose, MD;  Location: [facility] ENDOSCOPY;  Service: Cardiovascular;  Laterality: N/A;   TEE WITHOUT CARDIOVERSION N/A 08/02/2018   Procedure: TRANSESOPHAGEAL ECHOCARDIOGRAM (TEE);  Surgeon: Lewayne Bunting, MD;  Location[facility] ENDOSCOPY;  Service: Cardiovascular;  Laterality: N/A;    SOCIAL HISTORY: Social History   Socioeconomic History   Marital status: Widowed    Spouse name: Not on file   Number of  children: 2   Years of education: Not on file   Highest education level: Not on file  Occupational History    Employer: RETIRED    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  Tobacco Use   Smoking status: Former    Types: Cigarettes    Quit date: 11/03/1963    Years since quitting: 59.3   Smokeless tobacco: Never   Tobacco comments:    Former smoker 12/21/22  Vaping Use   Vaping Use: Never used  Substance and Sexual Activity   Alcohol use: No   Drug use: No   Sexual activity: Not Currently  Other Topics Concern   Not on file  Social History Narrative   Lives in [place], alone   Retired from Human resources officer   Social Determinants of Health   Financial Resource Strain: Not on file  Food Insecurity: No Food Insecurity (12/23/2022)   Hunger Vital Sign    Worried About Running Out of Food in the Last Year: Never true    Ran Out of Food in the Last Year: Never true  Transportation Needs: No Transportation Needs (12/23/2022)   PRAPARE - Administrator, Civil Service (Medical): No    Lack of Transportation (Non-Medical): No  Physical Activity:  Not on file  Stress: Not on file  Social Connections: Not on file  Intimate Partner Violence: Not At Risk (12/23/2022)   Humiliation, Afraid, Rape, and Kick questionnaire    Fear of Current or Ex-Partner: No    Emotionally Abused: No    Physically Abused: No    Sexually Abused: No    FAMILY HISTORY: Family History  Problem Relation Age of Onset   Heart failure Brother     ALLERGIES:  is allergic to prednisone, trazodone hcl, and doxycycline.  MEDICATIONS:  Current Outpatient Medications  Medication Sig Dispense Refill   Calcium Citrate 250 MG TABS Take 1 tablet (250 mg total) by mouth daily with breakfast.  0   Cholecalciferol (VITAMIN D3) 2000 units TABS Take 2,000 Units by mouth daily.     Cyanocobalamin (B-12) 2500 MCG SUBL Place 1 tablet under the tongue daily.     dofetilide (TIKOSYN) 250 MCG capsule Take 1 capsule (250 mcg  total) by mouth 2 (two) times daily. 180 capsule 1   doxazosin (CARDURA) 8 MG tablet Take 1 tablet (8 mg total) by mouth at bedtime. 90 tablet 3   lisinopril (ZESTRIL) 20 MG tablet Take 20 mg by mouth daily as needed (Blood pressure greater then 160).     multivitamin (ONE-A-DAY MEN'S) TABS tablet Take 1 tablet by mouth daily.  0   nitroGLYCERIN (NITROSTAT) 0.4 MG SL tablet PLACE 1 TABLET UNDER THE TONGUE EVERY 5 MINUTES AS NEEDED FOR CHEST PAIN (Patient taking differently: Place 0.4 mg under the tongue every 5 (five) minutes as needed for chest pain.) 25 tablet 0   polyethylene glycol (MIRALAX / GLYCOLAX) packet Take 17 g by mouth daily.     rivaroxaban (XARELTO) 20 MG TABS tablet Take 1 tablet (20 mg total) by mouth daily with supper. 90 tablet 2   rOPINIRole (REQUIP) 0.25 MG tablet Take 0.25 mg by mouth at bedtime.      simvastatin (ZOCOR) 5 MG tablet Take 5 mg by mouth daily.      vitamin C (ASCORBIC ACID) 250 MG tablet Take 1 tablet (250 mg total) by mouth daily.     No current facility-administered medications for this visit.    REVIEW OF SYSTEMS:    10 Point review of Systems was done is negative except as noted above.   PHYSICAL EXAMINATION: TELEMEDICINE VISIT  LABORATORY DATA:  I have reviewed the data as listed .    Latest Ref Rng & Units 02/15/2023    1:13 PM 07/17/2022   12:51 PM 08/11/2021   12:43 PM  CBC  WBC 4.0 - 10.5 K/uL 4.5  7.6  4.7   Hemoglobin 13.0 - 17.0 g/dL 16.1  09.6  04.5   Hematocrit 39.0 - 52.0 % 42.4  47.4  44.5   Platelets 150 - 400 K/uL 127  122  114    .    Latest Ref Rng & Units 02/15/2023    1:13 PM 12/30/2022    2:20 PM 12/24/2022    2:48 AM  CMP  Glucose 70 - 99 mg/dL 93  409  99   BUN 8 - 23 mg/dL 15  8  19    Creatinine 0.61 - 1.24 mg/dL 8.11  9.14  7.82   Sodium 135 - 145 mmol/L 136  134  135   Potassium 3.5 - 5.1 mmol/L 4.4  4.0  4.2   Chloride 98 - 111 mmol/L 103  103  100   CO2 22 - 32 mmol/L 28  18  25   Calcium 8.9 - 10.3 mg/dL  9.3  8.7  8.9   Total Protein 6.5 - 8.1 g/dL 7.0     Total Bilirubin 0.3 - 1.2 mg/dL 0.7     Alkaline Phos 38 - 126 U/L 48     AST 15 - 41 U/L 16     ALT 0 - 44 U/L 9      . Lab Results  Component Value Date   LDH 116 02/15/2023     RADIOGRAPHIC STUDIES: I have personally reviewed the radiological images as listed and agreed with the findings in the report. ECHOCARDIOGRAM COMPLETE  Result Date: 02/26/2023    ECHOCARDIOGRAM REPORT   Patient Name:   Stanley Waters Date of Exam: 02/25/2023 Medical Rec #:  [id number]        Height:       72.0 in Accession #:    [id number]       Weight:       202.1 lb Date of Birth:  [birth year]         BSA:          2.140 m Patient Age:    88 years         BP:           142/80 mmHg Patient Gender: M                HR:           56 bpm. Exam Location:  [address] Procedure: 2D Echo, Cardiac Doppler, Color Doppler and Strain Analysis Indications:    I10 Hypertension  History:        Patient has prior history of Echocardiogram examinations, most                 recent 02/22/2020. CAD, Arrythmias:Atrial Fibrillation; Risk                 Factors:Dyslipidemia and Former Smoker. Dizziness. Aneurysm of                 ascending aorta without rupture.  Sonographer:    Cathie Beams RCS Referring Phys: [phone] JONATHAN J BERRY IMPRESSIONS  1. Left ventricular ejection fraction, by estimation, is 55 to 60%. The left ventricle has normal function. The left ventricle has no regional wall motion abnormalities. There is mild concentric left ventricular hypertrophy. Left ventricular diastolic parameters were normal.  2. Right ventricular systolic function is normal. The right ventricular size is mildly enlarged.  3. Left atrial size was moderately dilated.  4. The mitral valve is normal in structure. Mild mitral valve regurgitation. No evidence of mitral stenosis.  5. The aortic valve is tricuspid. There is moderate calcification of the aortic valve. Aortic valve regurgitation is  trivial. No aortic stenosis is present.  6. There is mild dilatation of the ascending aorta, measuring 42 mm. FINDINGS  Left Ventricle: Left ventricular ejection fraction, by estimation, is 55 to 60%. The left ventricle has normal function. The left ventricle has no regional wall motion abnormalities. The left ventricular internal cavity size was normal in size. There is  mild concentric left ventricular hypertrophy. Left ventricular diastolic parameters were normal. Right Ventricle: The right ventricular size is mildly enlarged. No increase in right ventricular wall thickness. Right ventricular systolic function is normal. Left Atrium: Left atrial size was moderately dilated. Right Atrium: Right atrial size was normal in size. Pericardium: There is no evidence of pericardial effusion. Mitral Valve: The mitral valve  is normal in structure. Mild mitral valve regurgitation. No evidence of mitral valve stenosis. Tricuspid Valve: The tricuspid valve is normal in structure. Tricuspid valve regurgitation is mild . No evidence of tricuspid stenosis. Aortic Valve: The aortic valve is tricuspid. There is moderate calcification of the aortic valve. Aortic valve regurgitation is trivial. No aortic stenosis is present. Pulmonic Valve: The pulmonic valve was normal in structure. Pulmonic valve regurgitation is trivial. No evidence of pulmonic stenosis. Aorta: The aortic root is normal in size and structure. There is mild dilatation of the ascending aorta, measuring 42 mm. Venous: The inferior vena cava was not well visualized. IAS/Shunts: No atrial level shunt detected by color flow Doppler.  LEFT VENTRICLE PLAX 2D LVIDd:         4.40 cm   Diastology LVIDs:         2.20 cm   LV e' medial:    4.78 cm/s LV PW:         1.20 cm   LV E/e' medial:  21.3 LV IVS:        1.20 cm   LV e' lateral:   7.87 cm/s LVOT diam:     2.20 cm   LV E/e' lateral: 13.0 LV SV:         86 LV SV Index:   40 LVOT Area:     3.80 cm  RIGHT VENTRICLE RV Basal  diam:  3.40 cm RV S prime:     11.00 cm/s TAPSE (M-mode): 2.3 cm RVSP:           28.8 mmHg LEFT ATRIUM              Index        RIGHT ATRIUM           Index LA diam:        4.50 cm  2.10 cm/m   RA Pressure: 3.00 mmHg LA Vol (A2C):   106.0 ml 49.53 ml/m  RA Area:     18.20 cm LA Vol (A4C):   98.5 ml  46.03 ml/m  RA Volume:   47.80 ml  22.34 ml/m LA Biplane Vol: 105.0 ml 49.06 ml/m  AORTIC VALVE LVOT Vmax:   98.60 cm/s LVOT Vmean:  64.200 cm/s LVOT VTI:    0.225 m  AORTA Ao Root diam: 3.40 cm Ao Asc diam:  4.20 cm MITRAL VALVE                TRICUSPID VALVE MV Area (PHT): 3.45 cm     TR Peak grad:   25.8 mmHg MV Decel Time: 220 msec     TR Vmax:        254.00 cm/s MV E velocity: 102.00 cm/s  Estimated RAP:  3.00 mmHg MV A velocity: 40.30 cm/s   RVSP:           28.8 mmHg MV E/A ratio:  2.53                             SHUNTS                             Systemic VTI:  0.22 m                             Systemic Diam: 2.20 cm Arvilla Meres MD Electronically signed by Arvilla Meres  MD Signature Date/Time: 02/26/2023/1:06:30 PM    Final     ASSESSMENT & PLAN:   88 y.o. male with multiple medical comorbidities with  #1 Chronic mild thrombocytopenia from at least 2011. Recent platelet with primary care physician or down to 118k Chronic nonprogressive thrombocytopenia suggests this could be due to one of his chronic medications. Unlikely to be related to his incidentally noted splenic cysts.  #2 incidentally noted Splenomegaly and  low-density splenic lesions consistent with splenic cysts.  One of the cysts was noted in 2008 and the other 1 is apparently new since 2008.  No inflammation or edema around the lesion to suggest splenic abscesses. No fevers chills or night sweats to suggest an acute infectious process or lymphoproliferative disorder. No overt splenomegaly. Given the patient's history of A. fib cannot rule out small old microinfarcts. Currently patient is having no pain and no symptoms  associated with these findings.  PLAN:  -Discussed lab results from 02/15/2023 in detail with patient. CBC showed WBC of 4.5K, hemoglobin of 14.8, and platelets of 127K. -platelet levels continue to improve, platelet levels 114K in 08/2021, 122K in 07/2022, and 127K in 02/2023 -LDH levels normal -CMP normal -BCR-ABL negative -discussed 12/10/2022 CT abdomen scan which revealed slightly enlarged spleen size of 14 cm as well as mild cyst in spleen -Discussed normal spleen size of 8-12 cm. -No obvious sign of lymphoma or chronic myeloid leukemia -patient did have non-specific molecular mutations present in peripheral blood based on JAK panel -given normal blood counts, would not recommend bone marrow biopsy at this time -discussed that splenomegaly may be a reactive process as some mutations are non specific -discussed that atrial fibrillation flutter medication such as Tikosyn Cardura or restless leg syndrome may cause fatigue. -May consider PET scan if fatigue symptoms become bothersome -continue to regularly follow-up with urologist, Dr. Ezzard Standing in regards to enlarged prostate -continue vitamin-D supplement, vitamin-C supplement, B-12 supplement, and multi-vitamin. -continue to monitor in 6 months  FOLLOW-UP:  RTC with Dr Candise Che with labs in 6 months  The total time spent in the appointment was 20 minutes* .  All of the patient's questions were answered with apparent satisfaction. The patient knows to call the clinic with any problems, questions or concerns.   Wyvonnia Lora MD MS AAHIVMS [facility] Hematology/Oncology Physician [facility]  .*Total Encounter Time as defined by the Centers for Medicare and Medicaid Services includes, in addition to the face-to-face time of a patient visit (documented in the note above) non-face-to-face time: obtaining and reviewing outside history, ordering and reviewing medications, tests or procedures, care coordination (communications with other  health care professionals or caregivers) and documentation in the medical record.    I,Mitra Faeizi,acting as a Neurosurgeon for Wyvonnia Lora, MD.,have documented all relevant documentation on the behalf of Wyvonnia Lora, MD,as directed by  Wyvonnia Lora, MD while in the presence of Wyvonnia Lora, MD.  .I have reviewed the above documentation for accuracy and completeness, and I agree with the above. Johney Maine MD

## 2023-03-03 ENCOUNTER — Telehealth: Payer: Self-pay | Admitting: Hematology

## 2023-03-03 ENCOUNTER — Inpatient Hospital Stay: Payer: Medicare HMO | Attending: Hematology | Admitting: Hematology

## 2023-03-03 DIAGNOSIS — D734 Cyst of spleen: Secondary | ICD-10-CM | POA: Diagnosis not present

## 2023-03-03 DIAGNOSIS — D696 Thrombocytopenia, unspecified: Secondary | ICD-10-CM | POA: Diagnosis not present

## 2023-03-03 DIAGNOSIS — R161 Splenomegaly, not elsewhere classified: Secondary | ICD-10-CM

## 2023-03-03 DIAGNOSIS — Z87891 Personal history of nicotine dependence: Secondary | ICD-10-CM | POA: Insufficient documentation

## 2023-03-11 DIAGNOSIS — G629 Polyneuropathy, unspecified: Secondary | ICD-10-CM | POA: Diagnosis not present

## 2023-03-11 DIAGNOSIS — I10 Essential (primary) hypertension: Secondary | ICD-10-CM | POA: Diagnosis not present

## 2023-03-11 DIAGNOSIS — R5383 Other fatigue: Secondary | ICD-10-CM | POA: Diagnosis not present

## 2023-03-11 DIAGNOSIS — R29898 Other symptoms and signs involving the musculoskeletal system: Secondary | ICD-10-CM | POA: Diagnosis not present

## 2023-04-08 DIAGNOSIS — G629 Polyneuropathy, unspecified: Secondary | ICD-10-CM | POA: Diagnosis not present

## 2023-04-08 DIAGNOSIS — I10 Essential (primary) hypertension: Secondary | ICD-10-CM | POA: Diagnosis not present

## 2023-04-08 DIAGNOSIS — R29898 Other symptoms and signs involving the musculoskeletal system: Secondary | ICD-10-CM | POA: Diagnosis not present

## 2023-04-19 ENCOUNTER — Telehealth: Payer: Self-pay | Admitting: Cardiovascular Disease

## 2023-04-19 NOTE — Telephone Encounter (Signed)
Patient states he never go the results from his echocardiogram. Please advise

## 2023-04-19 NOTE — Telephone Encounter (Signed)
Returned call to patient and discussed echo results from 02/26/23.  Per Dr. Allyson Sabal: Normal LV systolic function with an ascending aorta measuring 42 mm.  Repeat 12 months   Patient verbalized understanding and expressed appreciation for call.

## 2023-04-20 ENCOUNTER — Ambulatory Visit: Payer: Medicare HMO | Admitting: Cardiovascular Disease

## 2023-04-27 DIAGNOSIS — I10 Essential (primary) hypertension: Secondary | ICD-10-CM | POA: Diagnosis not present

## 2023-05-04 DIAGNOSIS — G2581 Restless legs syndrome: Secondary | ICD-10-CM | POA: Diagnosis not present

## 2023-05-04 DIAGNOSIS — M810 Age-related osteoporosis without current pathological fracture: Secondary | ICD-10-CM | POA: Diagnosis not present

## 2023-05-04 DIAGNOSIS — Z0000 Encounter for general adult medical examination without abnormal findings: Secondary | ICD-10-CM | POA: Diagnosis not present

## 2023-05-04 DIAGNOSIS — D692 Other nonthrombocytopenic purpura: Secondary | ICD-10-CM | POA: Diagnosis not present

## 2023-05-04 DIAGNOSIS — I480 Paroxysmal atrial fibrillation: Secondary | ICD-10-CM | POA: Diagnosis not present

## 2023-05-04 DIAGNOSIS — N400 Enlarged prostate without lower urinary tract symptoms: Secondary | ICD-10-CM | POA: Diagnosis not present

## 2023-05-04 DIAGNOSIS — I2510 Atherosclerotic heart disease of native coronary artery without angina pectoris: Secondary | ICD-10-CM | POA: Diagnosis not present

## 2023-05-04 DIAGNOSIS — M109 Gout, unspecified: Secondary | ICD-10-CM | POA: Diagnosis not present

## 2023-05-04 DIAGNOSIS — D6869 Other thrombophilia: Secondary | ICD-10-CM | POA: Diagnosis not present

## 2023-05-06 DIAGNOSIS — H524 Presbyopia: Secondary | ICD-10-CM | POA: Diagnosis not present

## 2023-05-07 DIAGNOSIS — H524 Presbyopia: Secondary | ICD-10-CM | POA: Diagnosis not present

## 2023-06-10 DIAGNOSIS — H02132 Senile ectropion of right lower eyelid: Secondary | ICD-10-CM | POA: Diagnosis not present

## 2023-06-10 DIAGNOSIS — H04521 Eversion of right lacrimal punctum: Secondary | ICD-10-CM | POA: Diagnosis not present

## 2023-06-10 DIAGNOSIS — H0102A Squamous blepharitis right eye, upper and lower eyelids: Secondary | ICD-10-CM | POA: Diagnosis not present

## 2023-06-10 DIAGNOSIS — H0288B Meibomian gland dysfunction left eye, upper and lower eyelids: Secondary | ICD-10-CM | POA: Diagnosis not present

## 2023-06-10 DIAGNOSIS — H0102B Squamous blepharitis left eye, upper and lower eyelids: Secondary | ICD-10-CM | POA: Diagnosis not present

## 2023-06-10 DIAGNOSIS — H02135 Senile ectropion of left lower eyelid: Secondary | ICD-10-CM | POA: Diagnosis not present

## 2023-06-10 DIAGNOSIS — H1045 Other chronic allergic conjunctivitis: Secondary | ICD-10-CM | POA: Diagnosis not present

## 2023-06-10 DIAGNOSIS — H5789 Other specified disorders of eye and adnexa: Secondary | ICD-10-CM | POA: Diagnosis not present

## 2023-06-10 DIAGNOSIS — H0288A Meibomian gland dysfunction right eye, upper and lower eyelids: Secondary | ICD-10-CM | POA: Diagnosis not present

## 2023-06-21 DIAGNOSIS — I2510 Atherosclerotic heart disease of native coronary artery without angina pectoris: Secondary | ICD-10-CM | POA: Diagnosis not present

## 2023-06-22 DIAGNOSIS — T466X5A Adverse effect of antihyperlipidemic and antiarteriosclerotic drugs, initial encounter: Secondary | ICD-10-CM | POA: Diagnosis not present

## 2023-06-22 DIAGNOSIS — M791 Myalgia, unspecified site: Secondary | ICD-10-CM | POA: Diagnosis not present

## 2023-06-22 DIAGNOSIS — I2510 Atherosclerotic heart disease of native coronary artery without angina pectoris: Secondary | ICD-10-CM | POA: Diagnosis not present

## 2023-06-22 DIAGNOSIS — Z7901 Long term (current) use of anticoagulants: Secondary | ICD-10-CM | POA: Diagnosis not present

## 2023-06-22 DIAGNOSIS — I480 Paroxysmal atrial fibrillation: Secondary | ICD-10-CM | POA: Diagnosis not present

## 2023-06-22 DIAGNOSIS — E785 Hyperlipidemia, unspecified: Secondary | ICD-10-CM | POA: Diagnosis not present

## 2023-06-22 DIAGNOSIS — I10 Essential (primary) hypertension: Secondary | ICD-10-CM | POA: Diagnosis not present

## 2023-07-07 ENCOUNTER — Other Ambulatory Visit (HOSPITAL_COMMUNITY): Payer: Self-pay

## 2023-07-07 MED ORDER — DOFETILIDE 250 MCG PO CAPS: 250.0000 ug | ORAL_CAPSULE | Freq: Two times a day (BID) | ORAL | 1 refills | Status: DC

## 2023-07-08 ENCOUNTER — Other Ambulatory Visit (HOSPITAL_COMMUNITY): Payer: Self-pay | Admitting: *Deleted

## 2023-07-08 MED ORDER — DOFETILIDE 250 MCG PO CAPS: 250.0000 ug | ORAL_CAPSULE | Freq: Two times a day (BID) | ORAL | 2 refills | Status: DC

## 2023-08-02 ENCOUNTER — Ambulatory Visit: Payer: Medicare HMO | Admitting: Student

## 2023-08-04 ENCOUNTER — Ambulatory Visit: Payer: Medicare HMO | Admitting: Student

## 2023-09-02 ENCOUNTER — Other Ambulatory Visit: Payer: Self-pay

## 2023-09-02 DIAGNOSIS — D696 Thrombocytopenia, unspecified: Secondary | ICD-10-CM

## 2023-09-03 ENCOUNTER — Inpatient Hospital Stay: Payer: Medicare HMO | Admitting: Hematology

## 2023-09-03 ENCOUNTER — Inpatient Hospital Stay: Payer: Medicare HMO | Attending: Hematology

## 2023-09-03 VITALS — BP 139/95 | HR 70 | Temp 97.5°F | Resp 20 | Wt 200.1 lb

## 2023-09-03 DIAGNOSIS — R161 Splenomegaly, not elsewhere classified: Secondary | ICD-10-CM | POA: Insufficient documentation

## 2023-09-03 DIAGNOSIS — Z87891 Personal history of nicotine dependence: Secondary | ICD-10-CM | POA: Diagnosis not present

## 2023-09-03 DIAGNOSIS — D696 Thrombocytopenia, unspecified: Secondary | ICD-10-CM | POA: Diagnosis not present

## 2023-09-03 LAB — CBC WITH DIFFERENTIAL (CANCER CENTER ONLY)
Abs Immature Granulocytes: 0.02 10*3/uL (ref 0.00–0.07)
Basophils Absolute: 0 10*3/uL (ref 0.0–0.1)
Basophils Relative: 1 %
Eosinophils Absolute: 0.1 10*3/uL (ref 0.0–0.5)
Eosinophils Relative: 2 %
HCT: 40.2 % (ref 39.0–52.0)
Hemoglobin: 13.9 g/dL (ref 13.0–17.0)
Immature Granulocytes: 0 %
Lymphocytes Relative: 19 %
Lymphs Abs: 0.9 10*3/uL (ref 0.7–4.0)
MCH: 31.8 pg (ref 26.0–34.0)
MCHC: 34.6 g/dL (ref 30.0–36.0)
MCV: 92 fL (ref 80.0–100.0)
Monocytes Absolute: 0.3 10*3/uL (ref 0.1–1.0)
Monocytes Relative: 7 %
Neutro Abs: 3.3 10*3/uL (ref 1.7–7.7)
Neutrophils Relative %: 71 %
Platelet Count: 128 10*3/uL — ABNORMAL LOW (ref 150–400)
RBC: 4.37 MIL/uL (ref 4.22–5.81)
RDW: 12.8 % (ref 11.5–15.5)
WBC Count: 4.6 10*3/uL (ref 4.0–10.5)
nRBC: 0 % (ref 0.0–0.2)

## 2023-09-03 LAB — CMP (CANCER CENTER ONLY)
ALT: 10 U/L (ref 0–44)
AST: 14 U/L — ABNORMAL LOW (ref 15–41)
Albumin: 3.9 g/dL (ref 3.5–5.0)
Alkaline Phosphatase: 60 U/L (ref 38–126)
Anion gap: 4 — ABNORMAL LOW (ref 5–15)
BUN: 16 mg/dL (ref 8–23)
CO2: 28 mmol/L (ref 22–32)
Calcium: 8.9 mg/dL (ref 8.9–10.3)
Chloride: 104 mmol/L (ref 98–111)
Creatinine: 1.25 mg/dL — ABNORMAL HIGH (ref 0.61–1.24)
GFR, Estimated: 56 mL/min — ABNORMAL LOW (ref >60)
Glucose, Bld: 143 mg/dL — ABNORMAL HIGH (ref 70–99)
Potassium: 4.1 mmol/L (ref 3.5–5.1)
Sodium: 136 mmol/L (ref 135–145)
Total Bilirubin: 0.7 mg/dL (ref 0.3–1.2)
Total Protein: 6.7 g/dL (ref 6.5–8.1)

## 2023-09-03 LAB — LACTATE DEHYDROGENASE: LDH: 113 U/L (ref 98–192)

## 2023-09-03 NOTE — Progress Notes (Signed)
HEMATOLOGY/ONCOLOGY CLINIC VISIT NOTE  Date of Service: 09/03/23  Patient Care Team: Merri Brunette, MD as PCP - General (Internal Medicine) Runell Gess, MD as PCP - Cardiology (Cardiology) Mealor, Roberts Gaudy, MD as PCP - Electrophysiology (Cardiology) Barrett, Joline Salt, PA-C as Physician Assistant (Cardiology)  CHIEF COMPLAINTS/PURPOSE OF CONSULTATION:  Evaluation and management of splenic cyst and thrombocytopenia  HISTORY OF PRESENTING ILLNESS:   Stanley Waters is a wonderful 88 y.o. male who has been referred to [place] by Dr. Merri Brunette, MD for evaluation and management of splenic cyst and thrombocytopenia.  The pt has a history of atrial flutter,persistent atrial fibrillation, atypical chest pain, BPH, hypertension, dyslipidemia, chronic mild thrombocytopenia noted from 2011.  Patient had groin pain with a history of inguinal hernia repair and therefore had a CT of the abdomen and pelvis on 02/13/2021 which showed no acute intra-abdominal pathology but showed an incidental findings of Low-density structure involving the medial inferior spleen measures 3.0 cm. This has water attenuation. This splenic lesion was not present on the exam from 2008. This structure looks similar on the delayed images and this could represent a cyst. There is an additional hypodensity in the midportion of the spleen which was present in 2008. No perisplenic stranding or edema.  Lab results 03/03/2021 of CBC w/diff and CMP is as follows: all values are WNL except for Plt of 118K.  On review of systems, pt reports no fevers no chills no night sweats no unexpected weight loss no new fatigue.  No left upper quadrant abdominal pain.  No other acute new symptoms.. Notes he had COVID infection in jan 2020  Never had blood transfusions  INTERVAL HISTORY:  Stanley Waters is a wonderful 88 y.o. male here for evaluation and management of splenomegaly and thrombocytopenia.   I had a phone visit with  patient on 03/03/2023 and he reported frequent fatigue, restless leg symptoms, urinary frequency, and a couple near-syncope episodes.  Today, he reports that he has been doing well overall since his last visit and denies any new concerns over the last 6 months. Patient denies any bleeding issues such as nose bleeds or gum bleeds. He denies any abdominal pain, new lumps/bumps, back pain, constipation, unexplained fever, chills, night sweats, or unexpected weight loss. Patient does endorse leg swelling, which is managed by his cardiologist.   In regards to his current medications, he reports that he is no longer taking Simvastatin. Patient does take one 20 MG tablet of Furosemide daily and is also taking 10 MG Zetia. He has no other major medication changes.   He reports that he sees his PCP, Dr. Renne Crigler, twice a year.   He denies any plans for upcoming travel at this time.  MEDICAL HISTORY:  Past Medical History:  Diagnosis Date   Atrial flutter (HCC)    Atypical chest pain    Myoview performed 07/23/11 was completely normal. Post stress EF 61%.   Bruit    Left asymptomatic bruit. Carotid Duplex 12/13/12 =mildly abnormal. *BILATERAL BULB/PROXIMAL ICAs: Demonstrated a mild amount of fibrous plaque w/no evidence od significant diameter reduction, tortuosity or other vascular abnormality.   Enlarged prostate    BPH - PSA of 6.7 this is consistant with his last PSA of 6.3   Hyperlipemia    Hypertension    Inguinal hernia    Inguinal hernia repair (x) 2 1991, 2011   Intestinal polyps 09/22/11   Colonoscopy removed a 2 mm sessile cecal polyp with a cold  snare.   Measles    Mumps    Normal coronary arteries 2002   Peripheral neuropathy    Persistent atrial fibrillation (HCC)    Seasonal allergies    Skin cancer    Thrombocytopenia (HCC) 2011   Very mild with platelets of 135,000.   Tinnitus    Umbilical hernia    Vitamin D deficiency     SURGICAL HISTORY: Past Surgical History:  Procedure  Laterality Date   ATRIAL FIBRILLATION ABLATION  09/20/2018   ATRIAL FIBRILLATION ABLATION N/A 09/20/2018   Procedure: ATRIAL FIBRILLATION ABLATION;  Surgeon: Hillis Range, MD;  Location: [facility] INVASIVE CV LAB;  Service: Cardiovascular;  Laterality: N/A;   CARDIAC CATHETERIZATION  2002   "Normal cardiac catheterization"   CARDIOVERSION  07/07/2012   A fib - Cardioversion successful.   CARDIOVERSION N/A 06/30/2013   Procedure: CARDIOVERSION;  Surgeon: Thurmon Fair, MD;  Location: [facility] OR;  Service: Cardiovascular;  Laterality: N/A;   CARDIOVERSION N/A 08/02/2018   Procedure: CARDIOVERSION;  Surgeon: Lewayne Bunting, MD;  Location: [facility] ENDOSCOPY;  Service: Cardiovascular;  Laterality: N/A;   Carotid Duplex  12/13/12   For asymptomatic bruit. Mildly abnormal.  *BILATERAL BULB/PROXIMAL ICAs: Demonstrated a mild amount of fibrous plaque w/no evidence of significant diameter reduction, tortuosity or other vascular abnormality.    COLONOSCOPY W/ POLYPECTOMY  09/22/11   2 mm sessile cecal polyp was removed with a cold snare.   HAND SURGERY     INGUINAL HERNIA REPAIR  1991 & 2011   TEE WITHOUT CARDIOVERSION  07/07/2012   Procedure: TRANSESOPHAGEAL ECHOCARDIOGRAM (TEE);  Surgeon: Chrystie Nose, MD;  Location: [facility] ENDOSCOPY;  Service: Cardiovascular;  Laterality: N/A;   TEE WITHOUT CARDIOVERSION N/A 08/02/2018   Procedure: TRANSESOPHAGEAL ECHOCARDIOGRAM (TEE);  Surgeon: Lewayne Bunting, MD;  Location: [facility] ENDOSCOPY;  Service: Cardiovascular;  Laterality: N/A;    SOCIAL HISTORY: Social History   Socioeconomic History   Marital status: Widowed    Spouse name: Not on file   Number of children: 2   Years of education: Not on file   Highest education level: Not on file  Occupational History    Employer: RETIRED    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  Tobacco Use   Smoking status: Former    Current packs/day: 0.00    Types: Cigarettes    Quit date: 11/03/1963    Years since quitting: 59.8   Smokeless tobacco:  Never   Tobacco comments:    Former smoker 12/21/22  Vaping Use   Vaping status: Never Used  Substance and Sexual Activity   Alcohol use: No   Drug use: No   Sexual activity: Not Currently  Other Topics Concern   Not on file  Social History Narrative   Lives in [place], alone   Retired from Human resources officer   Social Determinants of Health   Financial Resource Strain: Not on file  Food Insecurity: No Food Insecurity (12/23/2022)   Hunger Vital Sign    Worried About Running Out of Food in the Last Year: Never true    Ran Out of Food in the Last Year: Never true  Transportation Needs: No Transportation Needs (12/23/2022)   PRAPARE - Administrator, Civil Service (Medical): No    Lack of Transportation (Non-Medical): No  Physical Activity: Not on file  Stress: Not on file  Social Connections: Unknown (09/27/2022)   Received from [facility]   Social Network    Social Network: Not on file  Intimate Partner  Violence: Not At Risk (12/23/2022)   Humiliation, Afraid, Rape, and Kick questionnaire    Fear of Current or Ex-Partner: No    Emotionally Abused: No    Physically Abused: No    Sexually Abused: No    FAMILY HISTORY: Family History  Problem Relation Age of Onset   Heart failure Brother     ALLERGIES:  is allergic to prednisone, trazodone hcl, and doxycycline.  MEDICATIONS:  Current Outpatient Medications  Medication Sig Dispense Refill   Calcium Citrate 250 MG TABS Take 1 tablet (250 mg total) by mouth daily with breakfast.  0   Cholecalciferol (VITAMIN D3) 2000 units TABS Take 2,000 Units by mouth daily.     Cyanocobalamin (B-12) 2500 MCG SUBL Place 1 tablet under the tongue daily.     dofetilide (TIKOSYN) 250 MCG capsule Take 1 capsule (250 mcg total) by mouth 2 (two) times daily. 180 capsule 2   doxazosin (CARDURA) 8 MG tablet Take 1 tablet (8 mg total) by mouth at bedtime. 90 tablet 3   lisinopril (ZESTRIL) 20 MG tablet Take 20 mg  by mouth daily as needed (Blood pressure greater then 160).     multivitamin (ONE-A-DAY MEN'S) TABS tablet Take 1 tablet by mouth daily.  0   nitroGLYCERIN (NITROSTAT) 0.4 MG SL tablet PLACE 1 TABLET UNDER THE TONGUE EVERY 5 MINUTES AS NEEDED FOR CHEST PAIN (Patient taking differently: Place 0.4 mg under the tongue every 5 (five) minutes as needed for chest pain.) 25 tablet 0   polyethylene glycol (MIRALAX / GLYCOLAX) packet Take 17 g by mouth daily.     rivaroxaban (XARELTO) 20 MG TABS tablet Take 1 tablet (20 mg total) by mouth daily with supper. 90 tablet 2   rOPINIRole (REQUIP) 0.25 MG tablet Take 0.25 mg by mouth at bedtime.      simvastatin (ZOCOR) 5 MG tablet Take 5 mg by mouth daily.      vitamin C (ASCORBIC ACID) 250 MG tablet Take 1 tablet (250 mg total) by mouth daily.     No current facility-administered medications for this visit.    REVIEW OF SYSTEMS:    10 Point review of Systems was done is negative except as noted above.   PHYSICAL EXAMINATION:  .BP (!) 139/95   Pulse 70   Temp (!) 97.5 F (36.4 C)   Resp 20   Wt 200 lb 1.6 oz (90.8 kg)   SpO2 100%   BMI 27.14 kg/m   GENERAL:alert, in no acute distress and comfortable SKIN: no acute rashes, no significant lesions EYES: conjunctiva are pink and non-injected, sclera anicteric OROPHARYNX: MMM, no exudates, no oropharyngeal erythema or ulceration NECK: supple, no JVD LYMPH:  no palpable lymphadenopathy in the cervical, axillary or inguinal regions LUNGS: clear to auscultation b/l with normal respiratory effort HEART: regular rate & rhythm ABDOMEN:  normoactive bowel sounds , non tender, not distended. Extremity: no pedal edema PSYCH: alert & oriented x 3 with fluent speech NEURO: no focal motor/sensory deficits    LABORATORY DATA:  I have reviewed the data as listed .    Latest Ref Rng & Units 09/03/2023    1:14 PM 02/15/2023    1:13 PM 07/17/2022   12:51 PM  CBC  WBC 4.0 - 10.5 K/uL 4.6  4.5  7.6    Hemoglobin 13.0 - 17.0 g/dL 09.8  11.9  14.7   Hematocrit 39.0 - 52.0 % 40.2  42.4  47.4   Platelets 150 - 400 K/uL 128  127  122    .    Latest Ref Rng & Units 09/03/2023    1:14 PM 02/15/2023    1:13 PM 12/30/2022    2:20 PM  CMP  Glucose 70 - 99 mg/dL 703  93  500   BUN 8 - 23 mg/dL 16  15  8    Creatinine 0.61 - 1.24 mg/dL 9.38  1.82  9.93   Sodium 135 - 145 mmol/L 136  136  134   Potassium 3.5 - 5.1 mmol/L 4.1  4.4  4.0   Chloride 98 - 111 mmol/L 104  103  103   CO2 22 - 32 mmol/L 28  28  18    Calcium 8.9 - 10.3 mg/dL 8.9  9.3  8.7   Total Protein 6.5 - 8.1 g/dL 6.7  7.0    Total Bilirubin 0.3 - 1.2 mg/dL 0.7  0.7    Alkaline Phos 38 - 126 U/L 60  48    AST 15 - 41 U/L 14  16    ALT 0 - 44 U/L 10  9     . Lab Results  Component Value Date   LDH 113 09/03/2023     RADIOGRAPHIC STUDIES: I have personally reviewed the radiological images as listed and agreed with the findings in the report. No results found.  ASSESSMENT & PLAN:   88 y.o. male with multiple medical comorbidities with  #1 Chronic mild thrombocytopenia from at least 2011. Recent platelet with primary care physician or down to 118k Chronic nonprogressive thrombocytopenia suggests this could be due to one of his chronic medications. Unlikely to be related to his incidentally noted splenic cysts.  #2 incidentally noted Splenomegaly and  low-density splenic lesions consistent with splenic cysts.  One of the cysts was noted in 2008 and the other 1 is apparently new since 2008.  No inflammation or edema around the lesion to suggest splenic abscesses. No fevers chills or night sweats to suggest an acute infectious process or lymphoproliferative disorder. No overt splenomegaly. Given the patient's history of A. fib cannot rule out small old microinfarcts. Currently patient is having no pain and no symptoms associated with these findings.  PLAN:  -Discussed lab results on 09/03/23 in detail with patient. CBC  showed WBC of 4.6K, hemoglobin normal at 13.9, and platelets stable at 128K. -CMP stable -LDH WNL -No major new concerns on blood tests. No suggestion of progressive pathology at this time. -patient is stable from a hematologic standpoint and further testing is not recommended at this time unless his blood counts change significantly.  -His spleen was borderline palpable during physical examination. Will plan to re-image the spleen with an [place] in 6-12 months to evaluate for any changes.  -will plan for patient to return to clinic with [place] in 1 year. If labs and [place] are stable at that time, we would likely plan to discharge patient to PCP for continued monitoring  -answered all of patient's questions in detail  FOLLOW-UP:  RTC with Dr Candise Che with labs in 12 months [place] abd in 11 months  The total time spent in the appointment was 21 minutes* .  All of the patient's questions were answered with apparent satisfaction. The patient knows to call the clinic with any problems, questions or concerns.   Stanley Lora MD MS AAHIVMS [facility] Hematology/Oncology Physician [facility]  .*Total Encounter Time as defined by the Centers for Medicare and Medicaid Services includes, in addition to the face-to-face time of a  patient visit (documented in the note above) non-face-to-face time: obtaining and reviewing outside history, ordering and reviewing medications, tests or procedures, care coordination (communications with other health care professionals or caregivers) and documentation in the medical record.    I,Stanley Waters,acting as a Neurosurgeon for Stanley Lora, MD.,have documented all relevant documentation on the behalf of Stanley Lora, MD,as directed by  Stanley Lora, MD while in the presence of Stanley Lora, MD.  .I have reviewed the above documentation for accuracy and completeness, and I agree with the above. Stanley Maine MD

## 2023-09-06 NOTE — Progress Notes (Unsigned)
  Electrophysiology Office Note:   Date:  09/07/2023  ID:  Stanley Waters, DOB 1935/06/08, MRN [id number]  Primary Cardiologist: Nanetta Batty, MD Electrophysiologist: Maurice Small, MD      History of Present Illness:   Stanley Waters is a 88 y.o. male with h/o AF, flutter s/p ablation, and HTN seen today for routine electrophysiology followup.   Since last being seen in our clinic the patient reports doing well. No breakthrough AF.  he denies chest pain, palpitations, dyspnea, PND, orthopnea, nausea, vomiting, dizziness, syncope, edema, weight gain, or early satiety.   Review of systems complete and found to be negative unless listed in HPI.   EP Information / Studies Reviewed:    EKG is ordered today. Personal review as below.  EKG Interpretation Date/Time:  Tuesday September 07 2023 10:09:31 EST Ventricular Rate:  59 PR Interval:  250 QRS Duration:  86 QT Interval:  444 QTC Calculation: 439 R Axis:   47  Text Interpretation: Sinus bradycardia with 1st degree A-V block Cannot rule out Anterior infarct (cited on or before 23-Dec-2022) Confirmed by Maxine Glenn [phone]) on 09/07/2023 10:22:30 AM    Arrhythmia History S/p ablation 09/2018 -> Amiodarone stopped Tikosy started 12/2022  Physical Exam:   VS:  BP 138/70   Pulse (!) 59   Ht 6' (1.829 m)   Wt 198 lb 12.8 oz (90.2 kg)   SpO2 99%   BMI 26.96 kg/m    Wt Readings from Last 3 Encounters:  09/07/23 198 lb 12.8 oz (90.2 kg)  09/03/23 200 lb 1.6 oz (90.8 kg)  02/15/23 202 lb 1.6 oz (91.7 kg)     GEN: Well nourished, well developed in no acute distress NECK: No JVD; No carotid bruits CARDIAC: Regular rate and rhythm, no murmurs, rubs, gallops RESPIRATORY:  Clear to auscultation without rales, wheezing or rhonchi  ABDOMEN: Soft, non-tender, non-distended EXTREMITIES:  No edema; No deformity   ASSESSMENT AND PLAN:    Paroxysmal atrial fibrillation Mitral Annual flutter EKG today shows NSR with stable  intervals Continue Tikosyn 250 mcg BID. CrCl stable to continue current dose Continue Xarelto for CHA2DS2VASc of at least 3. Mag today. Have asked pt to get Mg drawn with other [facility] at [facility] to limit separate draws.   HTN Stable on current regimen   Secondary hypercoagulable state Pt on Xarelto as above     Follow up with Dr. Nelly Laurence in 6 months  Signed, Graciella Freer, PA-C

## 2023-09-07 ENCOUNTER — Encounter: Payer: Self-pay | Admitting: Student

## 2023-09-07 ENCOUNTER — Ambulatory Visit: Payer: Medicare HMO | Attending: Student | Admitting: Student

## 2023-09-07 VITALS — BP 138/70 | HR 59 | Ht 72.0 in | Wt 198.8 lb

## 2023-09-07 DIAGNOSIS — D6869 Other thrombophilia: Secondary | ICD-10-CM

## 2023-09-07 DIAGNOSIS — I10 Essential (primary) hypertension: Secondary | ICD-10-CM | POA: Diagnosis not present

## 2023-09-07 DIAGNOSIS — I480 Paroxysmal atrial fibrillation: Secondary | ICD-10-CM

## 2023-09-07 NOTE — Patient Instructions (Signed)
Medication Instructions:  Your physician recommends that you continue on your current medications as directed. Please refer to the Current Medication list given to you today.  *If you need a refill on your cardiac medications before your next appointment, please call your pharmacy*  Lab Work: MAG-TODAY If you have labs (blood work) drawn today and your tests are completely normal, you will receive your results only by: MyChart Message (if you have MyChart) OR A paper copy in the mail If you have any lab test that is abnormal or we need to change your treatment, we will call you to review the results.  Follow-Up: At [facility], you and your health needs are our priority.  As part of our continuing mission to provide you with exceptional heart care, we have created designated Provider Care Teams.  These Care Teams include your primary Cardiologist (physician) and Advanced Practice Providers (APPs -  Physician Assistants and Nurse Practitioners) who all work together to provide you with the care you need, when you need it.  We recommend signing up for the patient portal called "MyChart".  Sign up information is provided on this After Visit Summary.  MyChart is used to connect with patients for Virtual Visits (Telemedicine).  Patients are able to view lab/test results, encounter notes, upcoming appointments, etc.  Non-urgent messages can be sent to your provider as well.   To learn more about what you can do with MyChart, go to [email or web address].    Your next appointment:   6 month(s)  Provider:   York Pellant, MD

## 2023-09-08 LAB — MAGNESIUM: Magnesium: 2.2 mg/dL (ref 1.6–2.3)

## 2023-10-12 DIAGNOSIS — H0288B Meibomian gland dysfunction left eye, upper and lower eyelids: Secondary | ICD-10-CM | POA: Diagnosis not present

## 2023-10-12 DIAGNOSIS — H02132 Senile ectropion of right lower eyelid: Secondary | ICD-10-CM | POA: Diagnosis not present

## 2023-10-12 DIAGNOSIS — H5789 Other specified disorders of eye and adnexa: Secondary | ICD-10-CM | POA: Diagnosis not present

## 2023-10-12 DIAGNOSIS — H0102B Squamous blepharitis left eye, upper and lower eyelids: Secondary | ICD-10-CM | POA: Diagnosis not present

## 2023-10-12 DIAGNOSIS — H1045 Other chronic allergic conjunctivitis: Secondary | ICD-10-CM | POA: Diagnosis not present

## 2023-10-12 DIAGNOSIS — H0102A Squamous blepharitis right eye, upper and lower eyelids: Secondary | ICD-10-CM | POA: Diagnosis not present

## 2023-10-12 DIAGNOSIS — H04123 Dry eye syndrome of bilateral lacrimal glands: Secondary | ICD-10-CM | POA: Diagnosis not present

## 2023-10-12 DIAGNOSIS — H0288A Meibomian gland dysfunction right eye, upper and lower eyelids: Secondary | ICD-10-CM | POA: Diagnosis not present

## 2023-10-12 DIAGNOSIS — H02135 Senile ectropion of left lower eyelid: Secondary | ICD-10-CM | POA: Diagnosis not present

## 2023-10-12 DIAGNOSIS — H16213 Exposure keratoconjunctivitis, bilateral: Secondary | ICD-10-CM | POA: Diagnosis not present

## 2023-12-13 DIAGNOSIS — L820 Inflamed seborrheic keratosis: Secondary | ICD-10-CM | POA: Diagnosis not present

## 2023-12-13 DIAGNOSIS — I872 Venous insufficiency (chronic) (peripheral): Secondary | ICD-10-CM | POA: Diagnosis not present

## 2023-12-13 DIAGNOSIS — Z85828 Personal history of other malignant neoplasm of skin: Secondary | ICD-10-CM | POA: Diagnosis not present

## 2023-12-13 DIAGNOSIS — L853 Xerosis cutis: Secondary | ICD-10-CM | POA: Diagnosis not present

## 2023-12-13 DIAGNOSIS — L821 Other seborrheic keratosis: Secondary | ICD-10-CM | POA: Diagnosis not present

## 2023-12-13 DIAGNOSIS — L308 Other specified dermatitis: Secondary | ICD-10-CM | POA: Diagnosis not present

## 2023-12-16 DIAGNOSIS — H04221 Epiphora due to insufficient drainage, right lacrimal gland: Secondary | ICD-10-CM | POA: Diagnosis not present

## 2023-12-16 DIAGNOSIS — H02132 Senile ectropion of right lower eyelid: Secondary | ICD-10-CM | POA: Diagnosis not present

## 2023-12-16 DIAGNOSIS — H02135 Senile ectropion of left lower eyelid: Secondary | ICD-10-CM | POA: Diagnosis not present

## 2023-12-21 DIAGNOSIS — I10 Essential (primary) hypertension: Secondary | ICD-10-CM | POA: Diagnosis not present

## 2023-12-21 DIAGNOSIS — I2510 Atherosclerotic heart disease of native coronary artery without angina pectoris: Secondary | ICD-10-CM | POA: Diagnosis not present

## 2023-12-21 DIAGNOSIS — E785 Hyperlipidemia, unspecified: Secondary | ICD-10-CM | POA: Diagnosis not present

## 2023-12-21 DIAGNOSIS — Z7901 Long term (current) use of anticoagulants: Secondary | ICD-10-CM | POA: Diagnosis not present

## 2023-12-28 DIAGNOSIS — I480 Paroxysmal atrial fibrillation: Secondary | ICD-10-CM | POA: Diagnosis not present

## 2023-12-28 DIAGNOSIS — E785 Hyperlipidemia, unspecified: Secondary | ICD-10-CM | POA: Diagnosis not present

## 2023-12-28 DIAGNOSIS — I10 Essential (primary) hypertension: Secondary | ICD-10-CM | POA: Diagnosis not present

## 2023-12-28 DIAGNOSIS — R7301 Impaired fasting glucose: Secondary | ICD-10-CM | POA: Diagnosis not present

## 2023-12-28 DIAGNOSIS — M791 Myalgia, unspecified site: Secondary | ICD-10-CM | POA: Diagnosis not present

## 2023-12-28 DIAGNOSIS — T466X5A Adverse effect of antihyperlipidemic and antiarteriosclerotic drugs, initial encounter: Secondary | ICD-10-CM | POA: Diagnosis not present

## 2023-12-28 DIAGNOSIS — G629 Polyneuropathy, unspecified: Secondary | ICD-10-CM | POA: Diagnosis not present

## 2024-02-29 ENCOUNTER — Ambulatory Visit (HOSPITAL_COMMUNITY): Attending: Internal Medicine

## 2024-02-29 DIAGNOSIS — E782 Mixed hyperlipidemia: Secondary | ICD-10-CM | POA: Insufficient documentation

## 2024-02-29 DIAGNOSIS — I7121 Aneurysm of the ascending aorta, without rupture: Secondary | ICD-10-CM | POA: Insufficient documentation

## 2024-02-29 DIAGNOSIS — I10 Essential (primary) hypertension: Secondary | ICD-10-CM | POA: Insufficient documentation

## 2024-02-29 LAB — ECHOCARDIOGRAM COMPLETE
Area-P 1/2: 2.8 cm2
S' Lateral: 3 cm

## 2024-04-07 ENCOUNTER — Other Ambulatory Visit (HOSPITAL_COMMUNITY): Payer: Self-pay | Admitting: *Deleted

## 2024-04-07 MED ORDER — DOFETILIDE 250 MCG PO CAPS: 250.0000 ug | ORAL_CAPSULE | Freq: Two times a day (BID) | ORAL | 0 refills | Status: DC

## 2024-04-12 NOTE — Progress Notes (Signed)
  Electrophysiology Office Note:   Date:  04/13/2024  ID:  Stanley Waters, DOB January 25, 1935, MRN [id number]  Primary Cardiologist: Lauro Portal, MD Electrophysiologist: Efraim Grange, MD      History of Present Illness:   Stanley Waters is a 89 y.o. male with h/o AF, flutter s/p ablation, and HTN seen today for routine electrophysiology followup.   Since last being seen in our clinic the patient reports doing well. No breakthrough AF of which he is aware. Occasional peripheral edema.  he denies chest pain, palpitations, dyspnea, PND, orthopnea, nausea, vomiting, dizziness, syncope, weight gain, or early satiety.   Review of systems complete and found to be negative unless listed in HPI.   EP Information / Studies Reviewed:    EKG is ordered today. Personal review as below.  EKG Interpretation Date/Time:  Thursday April 13 2024 09:26:18 EDT Ventricular Rate:  63 PR Interval:    QRS Duration:  90 QT Interval:  428 QTC Calculation: 437 R Axis:   0  Text Interpretation: Sinus arrhythmia vs sinus with a blocked PAC When compared with ECG of 07-Sep-2023 10:09, Current undetermined rhythm precludes rhythm comparison, needs review Confirmed by Stanley Waters [phone] on 04/13/2024 9:29:14 AM    Arrhythmia/Device History No specialty comments available.   Physical Exam:   VS:  BP 118/68   Pulse 63   Ht 6' (1.829 m)   Wt 202 lb 12.8 oz (92 kg)   SpO2 98%   BMI 27.50 kg/m    Wt Readings from Last 3 Encounters:  04/13/24 202 lb 12.8 oz (92 kg)  09/07/23 198 lb 12.8 oz (90.2 kg)  09/03/23 200 lb 1.6 oz (90.8 kg)     GEN: No acute distress NECK: No JVD; No carotid bruits CARDIAC: Regular rate and rhythm, no murmurs, rubs, gallops RESPIRATORY:  Clear to auscultation without rales, wheezing or rhonchi  ABDOMEN: Soft, non-tender, non-distended EXTREMITIES:  No edema; No deformity   ASSESSMENT AND PLAN:    Paroxysmal AF Mitral Annular Flutter EKG today shows NSR with stable  intervals Continue Tikosyn  250 mcg BID Continue Xarelto  20 mg daily for CHA2DS2/VASc of at least 4 BMET and MG today  HTN Stable on current regimen   Secondary hypercoagulable state Pt on Xarelto  as above    Follow up with EP Team in 6 months  Signed, Tylene Galla, PA-C

## 2024-04-13 ENCOUNTER — Ambulatory Visit: Attending: Student | Admitting: Student

## 2024-04-13 ENCOUNTER — Encounter: Payer: Self-pay | Admitting: Student

## 2024-04-13 VITALS — BP 118/68 | HR 63 | Ht 72.0 in | Wt 202.8 lb

## 2024-04-13 DIAGNOSIS — I480 Paroxysmal atrial fibrillation: Secondary | ICD-10-CM | POA: Diagnosis not present

## 2024-04-13 DIAGNOSIS — Z5181 Encounter for therapeutic drug level monitoring: Secondary | ICD-10-CM | POA: Diagnosis not present

## 2024-04-13 DIAGNOSIS — Z79899 Other long term (current) drug therapy: Secondary | ICD-10-CM | POA: Diagnosis not present

## 2024-04-13 NOTE — Patient Instructions (Signed)
 Medication Instructions:  No medication changes today. *If you need a refill on your cardiac medications before your next appointment, please call your pharmacy*  Lab Work: BMET and Mg today. If you have labs (blood work) drawn today and your tests are completely normal, you will receive your results only by: MyChart Message (if you have MyChart) OR A paper copy in the mail If you have any lab test that is abnormal or we need to change your treatment, we will call you to review the results.  Testing/Procedures: No testing ordered today  Follow-Up: At [facility], you and your health needs are our priority.  As part of our continuing mission to provide you with exceptional heart care, our providers are all part of one team.  This team includes your primary Cardiologist (physician) and Advanced Practice Providers or APPs (Physician Assistants and Nurse Practitioners) who all work together to provide you with the care you need, when you need it.  Your next appointment:   6 month(s)  Provider:   You may see Efraim Grange, MD or one of the following Advanced Practice Providers on your designated Care Team:   Mertha Abrahams, PA-C Roddrick Sharron Andy Mieke Brinley, PA-C Suzann Riddle, NP Creighton Doffing, NP    We recommend signing up for the patient portal called MyChart.  Sign up information is provided on this After Visit Summary.  MyChart is used to connect with patients for Virtual Visits (Telemedicine).  Patients are able to view lab/test results, encounter notes, upcoming appointments, etc.  Non-urgent messages can be sent to your provider as well.   To learn more about what you can do with MyChart, go to [email or web address].

## 2024-04-14 ENCOUNTER — Ambulatory Visit: Payer: Self-pay | Admitting: Student

## 2024-04-14 LAB — BASIC METABOLIC PANEL WITH GFR
BUN/Creatinine Ratio: 15 (ref 10–24)
BUN: 16 mg/dL (ref 8–27)
CO2: 23 mmol/L (ref 20–29)
Calcium: 9.1 mg/dL (ref 8.6–10.2)
Chloride: 100 mmol/L (ref 96–106)
Creatinine, Ser: 1.04 mg/dL (ref 0.76–1.27)
Glucose: 110 mg/dL — ABNORMAL HIGH (ref 70–99)
Potassium: 4.4 mmol/L (ref 3.5–5.2)
Sodium: 138 mmol/L (ref 134–144)
eGFR: 69 mL/min/{1.73_m2} (ref >59)

## 2024-04-14 LAB — MAGNESIUM: Magnesium: 2.1 mg/dL (ref 1.6–2.3)

## 2024-05-03 DIAGNOSIS — I10 Essential (primary) hypertension: Secondary | ICD-10-CM | POA: Diagnosis not present

## 2024-05-08 DIAGNOSIS — I2510 Atherosclerotic heart disease of native coronary artery without angina pectoris: Secondary | ICD-10-CM | POA: Diagnosis not present

## 2024-05-08 DIAGNOSIS — E785 Hyperlipidemia, unspecified: Secondary | ICD-10-CM | POA: Diagnosis not present

## 2024-05-08 DIAGNOSIS — K5900 Constipation, unspecified: Secondary | ICD-10-CM | POA: Diagnosis not present

## 2024-05-08 DIAGNOSIS — I480 Paroxysmal atrial fibrillation: Secondary | ICD-10-CM | POA: Diagnosis not present

## 2024-05-08 DIAGNOSIS — R8271 Bacteriuria: Secondary | ICD-10-CM | POA: Diagnosis not present

## 2024-05-08 DIAGNOSIS — R3129 Other microscopic hematuria: Secondary | ICD-10-CM | POA: Diagnosis not present

## 2024-05-08 DIAGNOSIS — D696 Thrombocytopenia, unspecified: Secondary | ICD-10-CM | POA: Diagnosis not present

## 2024-05-08 DIAGNOSIS — I10 Essential (primary) hypertension: Secondary | ICD-10-CM | POA: Diagnosis not present

## 2024-05-08 DIAGNOSIS — Z0000 Encounter for general adult medical examination without abnormal findings: Secondary | ICD-10-CM | POA: Diagnosis not present

## 2024-05-09 DIAGNOSIS — G629 Polyneuropathy, unspecified: Secondary | ICD-10-CM | POA: Diagnosis not present

## 2024-05-09 DIAGNOSIS — R3129 Other microscopic hematuria: Secondary | ICD-10-CM | POA: Diagnosis not present

## 2024-05-30 DIAGNOSIS — I10 Essential (primary) hypertension: Secondary | ICD-10-CM | POA: Diagnosis not present

## 2024-06-14 DIAGNOSIS — N3000 Acute cystitis without hematuria: Secondary | ICD-10-CM | POA: Diagnosis not present

## 2024-06-14 DIAGNOSIS — N401 Enlarged prostate with lower urinary tract symptoms: Secondary | ICD-10-CM | POA: Diagnosis not present

## 2024-06-14 DIAGNOSIS — R3121 Asymptomatic microscopic hematuria: Secondary | ICD-10-CM | POA: Diagnosis not present

## 2024-06-14 DIAGNOSIS — R3915 Urgency of urination: Secondary | ICD-10-CM | POA: Diagnosis not present

## 2024-06-14 DIAGNOSIS — R351 Nocturia: Secondary | ICD-10-CM | POA: Diagnosis not present

## 2024-06-14 DIAGNOSIS — R3912 Poor urinary stream: Secondary | ICD-10-CM | POA: Diagnosis not present

## 2024-06-19 DIAGNOSIS — I2510 Atherosclerotic heart disease of native coronary artery without angina pectoris: Secondary | ICD-10-CM | POA: Diagnosis not present

## 2024-06-20 DIAGNOSIS — E785 Hyperlipidemia, unspecified: Secondary | ICD-10-CM | POA: Diagnosis not present

## 2024-06-20 DIAGNOSIS — M818 Other osteoporosis without current pathological fracture: Secondary | ICD-10-CM | POA: Diagnosis not present

## 2024-06-20 DIAGNOSIS — I10 Essential (primary) hypertension: Secondary | ICD-10-CM | POA: Diagnosis not present

## 2024-06-20 DIAGNOSIS — M810 Age-related osteoporosis without current pathological fracture: Secondary | ICD-10-CM | POA: Diagnosis not present

## 2024-06-20 DIAGNOSIS — I480 Paroxysmal atrial fibrillation: Secondary | ICD-10-CM | POA: Diagnosis not present

## 2024-06-22 DIAGNOSIS — H04123 Dry eye syndrome of bilateral lacrimal glands: Secondary | ICD-10-CM | POA: Diagnosis not present

## 2024-06-22 DIAGNOSIS — H0102B Squamous blepharitis left eye, upper and lower eyelids: Secondary | ICD-10-CM | POA: Diagnosis not present

## 2024-06-22 DIAGNOSIS — H0102A Squamous blepharitis right eye, upper and lower eyelids: Secondary | ICD-10-CM | POA: Diagnosis not present

## 2024-06-22 DIAGNOSIS — H02132 Senile ectropion of right lower eyelid: Secondary | ICD-10-CM | POA: Diagnosis not present

## 2024-06-22 DIAGNOSIS — H04521 Eversion of right lacrimal punctum: Secondary | ICD-10-CM | POA: Diagnosis not present

## 2024-06-22 DIAGNOSIS — H02135 Senile ectropion of left lower eyelid: Secondary | ICD-10-CM | POA: Diagnosis not present

## 2024-06-22 DIAGNOSIS — H1045 Other chronic allergic conjunctivitis: Secondary | ICD-10-CM | POA: Diagnosis not present

## 2024-06-23 ENCOUNTER — Telehealth: Payer: Self-pay | Admitting: Hematology

## 2024-06-23 NOTE — Telephone Encounter (Signed)
 I spoke with Stanley Waters to inform him of his re-scheduled appointment on 10/28

## 2024-07-06 ENCOUNTER — Other Ambulatory Visit (HOSPITAL_COMMUNITY): Payer: Self-pay | Admitting: *Deleted

## 2024-07-06 MED ORDER — DOFETILIDE 250 MCG PO CAPS: 250.0000 ug | ORAL_CAPSULE | Freq: Two times a day (BID) | ORAL | 1 refills | Status: AC

## 2024-07-25 DIAGNOSIS — R351 Nocturia: Secondary | ICD-10-CM | POA: Diagnosis not present

## 2024-07-25 DIAGNOSIS — N401 Enlarged prostate with lower urinary tract symptoms: Secondary | ICD-10-CM | POA: Diagnosis not present

## 2024-07-25 DIAGNOSIS — R3121 Asymptomatic microscopic hematuria: Secondary | ICD-10-CM | POA: Diagnosis not present

## 2024-07-27 ENCOUNTER — Other Ambulatory Visit: Payer: Self-pay | Admitting: Urology

## 2024-07-27 DIAGNOSIS — N401 Enlarged prostate with lower urinary tract symptoms: Secondary | ICD-10-CM

## 2024-08-04 ENCOUNTER — Ambulatory Visit
Admission: RE | Admit: 2024-08-04 | Discharge: 2024-08-04 | Disposition: A | Discharge status: Home / Self Care | Source: Ambulatory Visit | Attending: Urology | Admitting: Urology

## 2024-08-04 DIAGNOSIS — N401 Enlarged prostate with lower urinary tract symptoms: Secondary | ICD-10-CM | POA: Diagnosis not present

## 2024-08-04 NOTE — Consult Note (Signed)
 Chief Complaint: Patient was seen in consultation today for BPH w/ LUTS and nocturia at the request of Wrenn,John  Referring Physician(s): Wrenn,John  History of Present Illness: Stanley Waters is a 89 y.o. male with many year history of BPH , BOO and LUTS. Mainly bothered by nocturia x4 or so nightly. Negative prostate bx x2. Prostate volume 148cc. Enlarged medial lobe. PVR 98ml.  IPSS 20 severe QoL 6 terrible  Past Medical History:  Diagnosis Date   Atrial flutter (HCC)    Atypical chest pain    Myoview performed 07/23/11 was completely normal. Post stress EF 61%.   Bruit    Left asymptomatic bruit. Carotid Duplex 12/13/12 =mildly abnormal. *BILATERAL BULB/PROXIMAL ICAs: Demonstrated a mild amount of fibrous plaque w/no evidence od significant diameter reduction, tortuosity or other vascular abnormality.   Enlarged prostate    BPH - PSA of 6.7 this is consistant with his last PSA of 6.3   Hyperlipemia    Hypertension    Inguinal hernia    Inguinal hernia repair (x) 2 1991, 2011   Intestinal polyps 09/22/11   Colonoscopy removed a 2 mm sessile cecal polyp with a cold snare.   Measles    Mumps    Normal coronary arteries 2002   Peripheral neuropathy    Persistent atrial fibrillation (HCC)    Seasonal allergies    Skin cancer    Thrombocytopenia 2011   Very mild with platelets of 135,000.   Tinnitus    Umbilical hernia    Vitamin D  deficiency     Past Surgical History:  Procedure Laterality Date   ATRIAL FIBRILLATION ABLATION  09/20/2018   ATRIAL FIBRILLATION ABLATION N/A 09/20/2018   Procedure: ATRIAL FIBRILLATION ABLATION;  Surgeon: Kelsie Agent, MD;  Location: [facility];  Service: Cardiovascular;  Laterality: N/A;   CARDIAC CATHETERIZATION  2002   Normal cardiac catheterization   CARDIOVERSION  07/07/2012   A fib - Cardioversion successful.   CARDIOVERSION N/A 06/30/2013   Procedure: CARDIOVERSION;  Surgeon: Jerel Balding, MD;  Location: [facility] OR;   Service: Cardiovascular;  Laterality: N/A;   CARDIOVERSION N/A 08/02/2018   Procedure: CARDIOVERSION;  Surgeon: Pietro Redell RAMAN, MD;  Location: [facility] ENDOSCOPY;  Service: Cardiovascular;  Laterality: N/A;   Carotid Duplex  12/13/12   For asymptomatic bruit. Mildly abnormal.  *BILATERAL BULB/PROXIMAL ICAs: Demonstrated a mild amount of fibrous plaque w/no evidence of significant diameter reduction, tortuosity or other vascular abnormality.    COLONOSCOPY W/ POLYPECTOMY  09/22/11   2 mm sessile cecal polyp was removed with a cold snare.   HAND SURGERY     INGUINAL HERNIA REPAIR  1991 & 2011   TEE WITHOUT CARDIOVERSION  07/07/2012   Procedure: TRANSESOPHAGEAL ECHOCARDIOGRAM (TEE);  Surgeon: Vinie KYM Maxcy, MD;  Location: [facility] ENDOSCOPY;  Service: Cardiovascular;  Laterality: N/A;   TEE WITHOUT CARDIOVERSION N/A 08/02/2018   Procedure: TRANSESOPHAGEAL ECHOCARDIOGRAM (TEE);  Surgeon: Pietro Redell RAMAN, MD;  Location: [facility] ENDOSCOPY;  Service: Cardiovascular;  Laterality: N/A;    Allergies: Prednisone , Trazodone  hcl, and Doxycycline  Medications: Prior to Admission medications   Medication Sig Start Date End Date Taking? Authorizing Provider  Calcium  Citrate 250 MG TABS Take 1 tablet (250 mg total) by mouth daily with breakfast. 12/25/22   Fenton, Clint R, PA  Cholecalciferol  (VITAMIN D3) 2000 units TABS Take 2,000 Units by mouth daily.    [provider]  Cyanocobalamin  (B-12) 2500 MCG SUBL Place 1 tablet under the tongue daily.  [provider]  dofetilide  (TIKOSYN ) 250 MCG capsule Take 1 capsule (250 mcg total) by mouth 2 (two) times daily. 07/06/24   Lesia Ozell Barter, PA-C  doxazosin  (CARDURA ) 8 MG tablet Take 1 tablet (8 mg total) by mouth at bedtime. 10/04/13   Court Dorn PARAS, MD  ezetimibe (ZETIA) 10 MG tablet Take 10 mg by mouth daily.    [provider]  furosemide  (LASIX ) 20 MG tablet Take 20 mg by mouth daily.    [provider]  lisinopril   (ZESTRIL ) 20 MG tablet Take 20 mg by mouth daily as needed (Blood pressure greater then 160).    [provider]  multivitamin (ONE-A-DAY MEN'S) TABS tablet Take 1 tablet by mouth daily. 12/25/22   Fenton, Clint R, PA  nitroGLYCERIN  (NITROSTAT ) 0.4 MG SL tablet PLACE 1 TABLET UNDER THE TONGUE EVERY 5 MINUTES AS NEEDED FOR CHEST PAIN 07/20/22   Allred, Lynwood, MD  polyethylene glycol (MIRALAX  / GLYCOLAX ) packet Take 17 g by mouth daily.    [provider]  rivaroxaban  (XARELTO ) 20 MG TABS tablet Take 1 tablet (20 mg total) by mouth daily with supper. 12/30/22   Fenton, Clint R, PA  rOPINIRole  (REQUIP ) 0.25 MG tablet Take 0.25 mg by mouth at bedtime.  06/06/18   [provider]  vitamin C  (ASCORBIC ACID) 250 MG tablet Take 1 tablet (250 mg total) by mouth daily. 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  Tobacco Use   Smoking status: Former    Current packs/day: 0.00    Types: Cigarettes    Quit date: 11/03/1963    Years since quitting: 60.7   Smokeless tobacco: Never   Tobacco comments:    Former smoker 12/21/22  Vaping Use   Vaping status: Never Used  Substance and Sexual Activity   Alcohol use: No   Drug use: No   Sexual activity: Not Currently  Other Topics Concern   Not on file  Social History Narrative   Lives in [place], alone   Retired from Human resources officer   Social Drivers of Health   Financial Resource Strain: Not on file  Food Insecurity: No Food Insecurity (12/23/2022)   Hunger Vital Sign    Worried About Running Out of Food in the Last Year: Never true    Ran Out of Food in the Last Year: Never true   Transportation Needs: No Transportation Needs (12/23/2022)   PRAPARE - Administrator, Civil Service (Medical): No    Lack of Transportation (Non-Medical): No  Physical Activity: Not on file  Stress: Not on file  Social Connections: Unknown (09/27/2022)   Received from [facility]   Social Network    Social Network: Not on file    ECOG Status: 1 - Symptomatic but completely ambulatory  Review of Systems: A 12 point ROS discussed and pertinent positives are indicated in the HPI above.  All other systems are negative.  Physical Exam Constitutional: Oriented to person, place, and time. Well-developed and well-nourished. No distress.   HENT:  Head: Normocephalic and atraumatic.  Eyes: Conjunctivae and EOM are normal. Right eye exhibits no discharge. Left eye exhibits no discharge. No scleral icterus.  Neck: No JVD present.  Pulmonary/Chest: Effort normal. No stridor. No respiratory distress.  Abdomen: soft, non distended Neurological:  alert and oriented to person, place, and time.  Skin: Skin is warm and dry.  not diaphoretic.  Psychiatric:   normal mood and affect.   behavior is normal. Judgment and thought content normal.       Imaging: CT CHEST, ABDOMEN, AND PELVIS WITH CONTRAST  TECHNIQUE: Multidetector CT imaging of the chest, abdomen and pelvis was performed following the standard protocol during bolus administration of intravenous contrast.  RADIATION DOSE REDUCTION: This exam was performed according to the departmental dose-optimization program which includes automated exposure control, adjustment of the mA and/or kV according to patient size and/or use of iterative reconstruction technique.  CONTRAST: 100mL OMNIPAQUE IOHEXOL 300 MG/ML SOLN  COMPARISON: Multiple priors including CT Mar 13, 2024  FINDINGS: CT CHEST FINDINGS  Cardiovascular: Aortic atherosclerosis. Normal size heart. Fluid in the pericardial recesses similar  prior..  Mediastinum/Nodes: Loculated fluid collection or cystic lesion to the right of the hiatal hernia stable from prior examinations.  Pathologically enlarged mediastinal, hilar or axillary lymph nodes.  No suspicious thyroid  nodule. Moderate-sized hiatal hernia.  Lungs/Pleura: Similar left hilar masslike consolidation with the adjacent reticular opacities and associated volume loss measuring 3.7 x 2.5 cm on image 65/4 previously 4.2 x 3.2 cm when remeasured for consistency. Bandlike consolidation which extends from the hilum with bronchiectasis/bronchiolectasis also appears similar to prior.  Consolidative nodularity posterior to the right hilum measures 17 x 14 mm on image 71/4 previously 2.6 x 2.0 cm when remeasured. There are linear bands of opacification extending from the right hilum with associated architectural distortion similar appearance to prior. Nodular focus along the posterior portion of the linear opacities in the right upper lobe measures 5 mm on image 60/4 previously 3 mm another adjacent nodular focus on image 60/4 measures 5 mm not seen on prior examination.  Musculoskeletal: Sclerotic lesion in the T11 vertebral body appears similar prior. Stable sclerotic lesion in the right clavicle near the sternoclavicular joint.  CT ABDOMEN PELVIS FINDINGS  Hepatobiliary: No suspicious hepatic lesion. Gallbladder surgically absent. Prominence of the biliary tree may reflect reservoir effect post cholecystectomy. However, suggest correlation with serum total bilirubin for obstruction..  Pancreas: No pancreatic ductal dilation or evidence of acute inflammation.  Spleen: No splenomegaly or focal splenic lesion.  Adrenals/Urinary Tract: Right adrenal nodule measures 12 x 11 mm on image 55/2 previously 11 x 9 mm. No suspicious left adrenal nodule.  Stable hypodense bilateral renal lesions compatible with cysts. No hydronephrosis. Kidneys demonstrate symmetric  enhancement. Urinary bladder is unremarkable for degree of distension.  Stomach/Bowel: Moderate-sized hiatal hernia. No pathologic dilation of small or large bowel. Colonic diverticulosis.  Vascular/Lymphatic: Enlarged retroperitoneal lymph nodes. For reference:  -A left periaortic lymph node at the level of the left renal hilum measuring 12 mm in short axis on image 68/2 previously measured 9 mm in short axis.  -retrocaval lymph node measures 9 mm in short axis on image 67/2 previously 2 mm.  Aortic atherosclerosis.  Reproductive: Status post hysterectomy. No adnexal masses.  Other: No significant abdominopelvic free fluid.  Musculoskeletal: Sclerotic lesions in the L5 and S2 vertebral bodies. Stable sclerotic lesion in the left iliac bone on image 99/5. Prior L1 vertebral body augmentation. Multilevel degenerative change of the spine. Avascular necrosis of the left femoral head.  IMPRESSION: 1. Similar left hilar masslike consolidation with adjacent reticular opacities and associated volume loss, compatible with postradiation change. 2. Consolidative nodularity posterior to the right hilum is decreased in size from prior examination. 3. Nodular focus along the posterior portion of the linear opacities in the right upper lobe measures 5 mm previously 3 mm. Additional adjacent 5 mm nodular focus not seen on prior examination. Nonspecific suggest attention on follow-up imaging. 4. Increased size of the right adrenal nodule, suspicious for metastatic disease. 5. Increased size of the retroperitoneal lymph nodes, suspicious for metastatic disease. 6. Stable sclerotic osseous metastatic disease. 7. Prominence of the biliary tree may reflect reservoir effect post cholecystectomy. However, suggest correlation with serum total bilirubin for obstruction. 8. Avascular necrosis of the left femoral head. 9. Aortic atherosclerosis.  Aortic Atherosclerosis  (ICD10-I70.0).   Electronically Signed By: Reyes Holder M.D. On: 06/21/2024 15:32   Labs:  CBC: Recent Labs    09/03/23 1314  WBC 4.6  HGB 13.9  HCT 40.2  PLT 128*    COAGS: No results for input(s): INR, APTT in the last 8760 hours.  BMP: Recent Labs    09/03/23 1314 04/13/24 0949  NA 136 138  K 4.1 4.4  CL 104 100  CO2 28 23  GLUCOSE 143* 110*  BUN 16 16  CALCIUM  8.9 9.1  CREATININE 1.25* 1.04  GFRNONAA 56*  --     LIVER FUNCTION TESTS: Recent Labs    09/03/23 1314  BILITOT 0.7  AST 14*  ALT 10  ALKPHOS 60  PROT 6.7  ALBUMIN 3.9    TUMOR MARKERS: No results for input(s): AFPTM, CEA, CA199, CHROMGRNA in the last 8760 hours.  Assessment and Plan:   My impression is that this patient with benign prostatic hypertrophy has persistent lower urinary tract symptoms resulting in significant degradation in quality of life.  Based on his evaluation thus far, he would be an appropriate candidate for consideration of prostate artery embolization for durable long-term control of his symptoms. We discussed the PAE procedure, technique, anticipated benefits, alternatives,  time course of symptom resolution, possible risks and complications.  He seemed to understand and  did ask appropriate questions which were answered.  He is motivated to proceed.  Will get a CTA pelvis to assess the anatomy and ensure there are no significant anatomic contraindications.   Assuming this looks good, we can set him up for elective bilateral prostate artery embolization under moderate sedation as an outpatient .   Thank you for this interesting consult.  I greatly enjoyed meeting Stanley Waters and look forward to participating in their care.  A copy of this report was sent to the requesting provider on this date.  Electronically Signed: Dayne Serafin [place] 08/04/2024, 8:51 AM   I spent a total of  30 Minutes   in face to face in clinical consultation, greater than 50%  of which was counseling/coordinating care for BPH with LUTS.

## 2024-08-07 ENCOUNTER — Other Ambulatory Visit: Payer: Self-pay | Admitting: Interventional Radiology

## 2024-08-07 DIAGNOSIS — N401 Enlarged prostate with lower urinary tract symptoms: Secondary | ICD-10-CM

## 2024-08-10 ENCOUNTER — Inpatient Hospital Stay
Admission: RE | Admit: 2024-08-10 | Discharge: 2024-08-10 | Attending: Interventional Radiology | Admitting: Interventional Radiology

## 2024-08-10 ENCOUNTER — Encounter: Payer: Self-pay | Admitting: Radiology

## 2024-08-10 DIAGNOSIS — N401 Enlarged prostate with lower urinary tract symptoms: Secondary | ICD-10-CM

## 2024-08-10 MED ORDER — IOPAMIDOL (ISOVUE-370) INJECTION 76%
100.0000 mL | Freq: Once | INTRAVENOUS | Status: AC | PRN
  Administered 2024-08-10: 100 mL via INTRAVENOUS

## 2024-08-16 ENCOUNTER — Other Ambulatory Visit (HOSPITAL_COMMUNITY): Payer: Self-pay | Admitting: Interventional Radiology

## 2024-08-16 DIAGNOSIS — N401 Enlarged prostate with lower urinary tract symptoms: Secondary | ICD-10-CM

## 2024-08-22 ENCOUNTER — Encounter: Payer: Self-pay | Admitting: Neurology

## 2024-08-22 ENCOUNTER — Institutional Professional Consult (permissible substitution): Admitting: Neurology

## 2024-08-28 ENCOUNTER — Ambulatory Visit: Admitting: Neurology

## 2024-08-28 ENCOUNTER — Encounter: Payer: Self-pay | Admitting: Neurology

## 2024-08-28 VITALS — BP 142/69 | HR 63 | Ht 72.0 in | Wt 200.0 lb

## 2024-08-28 DIAGNOSIS — G629 Polyneuropathy, unspecified: Secondary | ICD-10-CM

## 2024-08-28 NOTE — Progress Notes (Signed)
 [facility]  PATIENT: Stanley Waters DOB: 1935-10-01  REQUESTING CLINICIAN: Clarice Nottingham, MD HISTORY FROM: Patient REASON FOR VISIT: Neuropathy    HISTORICAL  CHIEF COMPLAINT:  Chief Complaint  Patient presents with   RM 12    Consult: Polyneuropathy; BLE x~54yrs; no difficulty walking; has numbness/tingling, infrequent loss of total sensation    HISTORY OF PRESENT ILLNESS:  Discussed the use of AI scribe software for clinical note transcription with the patient, who gave verbal consent to proceed.  History of Present Illness   Stanley KNEECE is an 89 year old male who presents with chronic neuropathy symptoms. He was referred by Dr. Clarice for evaluation of his neuropathy.  He tells me that he has chronic neuropathy for the past 20 years and no new changes.  He has experienced neuropathy in for the past twenty years, characterized by numbness, tingling, and occasional shooting pain through his toes. His toes are twisted, and he occasionally applies a cream, does not remember the name,which he finds effective.  The numbness in his legs is bothersome but does not affect his ability to walk or cause falls. No pain is experienced while trying to sleep or while resting in the evening.  He uses a foot vibrator, which he reports has helped his symptoms and made them a whole lot better over time. Initially, his feet felt as if they were 'bricks'.  He was advised by a foot doctor to consider surgery for his toes and bunion, but another doctor recommended against it due to his age. He last saw a foot doctor about a year ago.  He takes vitamin B12. No zinc supplements are taken.    OTHER MEDICAL CONDITIONS: Hypertension, hyperlipidemia, RLS, CAD   REVIEW OF SYSTEMS: Full 14 system review of systems performed and negative with exception of: As noted in the HPI   ALLERGIES: Allergies  Allergen Reactions   Prednisone  Swelling, Other (See Comments) and  Hypertension    Tachycardia   Trazodone  Hcl Other (See Comments)   Doxycycline Rash    HOME MEDICATIONS: Outpatient Medications Prior to Visit  Medication Sig Dispense Refill   Calcium  Citrate 250 MG TABS Take 1 tablet (250 mg total) by mouth daily with breakfast.  0   Cholecalciferol  (VITAMIN D3) 2000 units TABS Take 2,000 Units by mouth daily.     Cyanocobalamin  (B-12) 2500 MCG SUBL Place 1 tablet under the tongue daily.     dofetilide  (TIKOSYN ) 250 MCG capsule Take 1 capsule (250 mcg total) by mouth 2 (two) times daily. 180 capsule 1   doxazosin  (CARDURA ) 8 MG tablet Take 1 tablet (8 mg total) by mouth at bedtime. 90 tablet 3   ezetimibe (ZETIA) 10 MG tablet Take 10 mg by mouth daily.     furosemide  (LASIX ) 20 MG tablet Take 20 mg by mouth daily.     lisinopril  (ZESTRIL ) 20 MG tablet Take 20 mg by mouth daily as needed (Blood pressure greater then 160).     multivitamin (ONE-A-DAY MEN'S) TABS tablet Take 1 tablet by mouth daily.  0   nitroGLYCERIN  (NITROSTAT ) 0.4 MG SL tablet PLACE 1 TABLET UNDER THE TONGUE EVERY 5 MINUTES AS NEEDED FOR CHEST PAIN 25 tablet 0   polyethylene glycol (MIRALAX  / GLYCOLAX ) packet Take 17 g by mouth daily.     rivaroxaban  (XARELTO ) 20 MG TABS tablet Take 1 tablet (20 mg total) by mouth daily with supper. 90 tablet 2   rOPINIRole  (REQUIP ) 0.5 MG tablet Take 0.5  mg by mouth at bedtime.     vitamin C  (ASCORBIC ACID) 250 MG tablet Take 1 tablet (250 mg total) by mouth daily.     rOPINIRole  (REQUIP ) 0.25 MG tablet Take 0.25 mg by mouth at bedtime.      No facility-administered medications prior to visit.    PAST MEDICAL HISTORY: Past Medical History:  Diagnosis Date   Atrial flutter (HCC)    Atypical chest pain    Myoview performed 07/23/11 was completely normal. Post stress EF 61%.   Bruit    Left asymptomatic bruit. Carotid Duplex 12/13/12 =mildly abnormal. *BILATERAL BULB/PROXIMAL ICAs: Demonstrated a mild amount of fibrous plaque w/no evidence od  significant diameter reduction, tortuosity or other vascular abnormality.   Enlarged prostate    BPH - PSA of 6.7 this is consistant with his last PSA of 6.3   Hyperlipemia    Hypertension    Inguinal hernia    Inguinal hernia repair (x) 2 1991, 2011   Intestinal polyps 09/22/11   Colonoscopy removed a 2 mm sessile cecal polyp with a cold snare.   Measles    Mumps    Normal coronary arteries 2002   Peripheral neuropathy    Persistent atrial fibrillation (HCC)    Seasonal allergies    Skin cancer    Thrombocytopenia 2011   Very mild with platelets of 135,000.   Tinnitus    Umbilical hernia    Vitamin D  deficiency     PAST SURGICAL HISTORY: Past Surgical History:  Procedure Laterality Date   ATRIAL FIBRILLATION ABLATION  09/20/2018   ATRIAL FIBRILLATION ABLATION N/A 09/20/2018   Procedure: ATRIAL FIBRILLATION ABLATION;  Surgeon: Kelsie Agent, MD;  [facility]: [facility] INVASIVE [facility] LAB;  Service: Cardiovascular;  Laterality: N/A;   CARDIAC CATHETERIZATION  2002   Normal cardiac catheterization   CARDIOVERSION  07/07/2012   A fib - Cardioversion successful.   CARDIOVERSION N/A 06/30/2013   Procedure: CARDIOVERSION;  Surgeon: Jerel Balding, MD;  Location: [facility] OR;  Service: Cardiovascular;  Laterality: N/A;   CARDIOVERSION N/A 08/02/2018   Procedure: CARDIOVERSION;  Surgeon: Pietro Redell RAMAN, MD;  Location: [facility] ENDOSCOPY;  Service: Cardiovascular;  Laterality: N/A;   Carotid Duplex  12/13/12   For asymptomatic bruit. Mildly abnormal.  *BILATERAL BULB/PROXIMAL ICAs: Demonstrated a mild amount of fibrous plaque w/no evidence of significant diameter reduction, tortuosity or other vascular abnormality.    COLONOSCOPY W/ POLYPECTOMY  09/22/11   2 mm sessile cecal polyp was removed with a cold snare.   HAND SURGERY     INGUINAL HERNIA REPAIR  1991 & 2011   TEE WITHOUT CARDIOVERSION  07/07/2012   Procedure: TRANSESOPHAGEAL ECHOCARDIOGRAM (TEE);  Surgeon: Vinie KYM Maxcy, MD;  Location: [facility]  ENDOSCOPY;  Service: Cardiovascular;  Laterality: N/A;   TEE WITHOUT CARDIOVERSION N/A 08/02/2018   Procedure: TRANSESOPHAGEAL ECHOCARDIOGRAM (TEE);  Surgeon: Pietro Redell RAMAN, MD;  Location: [facility] ENDOSCOPY;  Service: Cardiovascular;  Laterality: N/A;    FAMILY HISTORY: Family History  Problem Relation Age of Onset   Heart failure Brother     SOCIAL HISTORY: Social History   Socioeconomic History   Marital status: Widowed    Spouse name: Not on file   Number of children: 2   Years of education: Not on file   Highest education level: Not on file  Occupational History    Employer: RETIRED    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  Tobacco Use   Smoking status: Former    Current packs/day: 0.00    Types: Cigarettes  Quit date: 11/03/1963    Years since quitting: 60.8   Smokeless tobacco: Never   Tobacco comments:    Former smoker 12/21/22  Vaping Use   Vaping status: Never Used  Substance and Sexual Activity   Alcohol use: No   Drug use: No   Sexual activity: Not Currently  Other Topics Concern   Not on file  Social History Narrative   Lives in [place], alone   Retired from human resources officer   Social Drivers of Health   Financial Resource Strain: Not on file  Food Insecurity: No Food Insecurity (12/23/2022)   Hunger Vital Sign    Worried About Running Out of Food in the Last Year: Never true    Ran Out of Food in the Last Year: Never true  Transportation Needs: No Transportation Needs (12/23/2022)   PRAPARE - Administrator, Civil Service (Medical): No    Lack of Transportation (Non-Medical): No  Physical Activity: Not on file  Stress: Not on file  Social Connections: Unknown (09/27/2022)   Received from [facility]   Social Network    Social Network: Not on file  Intimate Partner Violence: Not At Risk (12/23/2022)   Humiliation, Afraid, Rape, and Kick questionnaire    Fear of Current or Ex-Partner: No    Emotionally Abused: No    Physically Abused: No     Sexually Abused: No    PHYSICAL EXAM  GENERAL EXAM/CONSTITUTIONAL: Vitals:  Vitals:   08/28/24 0811  BP: (!) 142/69  Pulse: 63  Weight: 200 lb (90.7 kg)  Height: 6' (1.829 m)   Body mass index is 27.12 kg/m. Wt Readings from Last 3 Encounters:  08/28/24 200 lb (90.7 kg)  04/13/24 202 lb 12.8 oz (92 kg)  09/07/23 198 lb 12.8 oz (90.2 kg)   Patient is in no distress; well developed, nourished and groomed; neck is supple  MUSCULOSKELETAL: Gait, strength, tone, movements noted in Neurologic exam below  NEUROLOGIC: MENTAL STATUS:      No data to display         awake, alert, oriented to person, place and time recent and remote memory intact normal attention and concentration language fluent, comprehension intact, naming intact fund of knowledge appropriate  CRANIAL NERVE:  2nd, 3rd, 4th, 6th - Visual fields full to confrontation, extraocular muscles intact, no nystagmus 5th - facial sensation symmetric 7th - facial strength symmetric 8th - hearing intact 9th - palate elevates symmetrically, uvula midline 11th - shoulder shrug symmetric 12th - tongue protrusion midline  MOTOR:  normal bulk and tone, full strength in the BUE, BLE  SENSORY:  Decreased light touch, pinprick, vibration to bilateral feet left worse than right.  Some difficulty with proprioception with the left foot  COORDINATION:  finger-nose-finger, fine finger movements normal  GAIT/STATION:  Wide base    DIAGNOSTIC DATA (LABS, IMAGING, TESTING) - I reviewed patient records, labs, notes, testing and imaging myself where available.  Lab Results  Component Value Date   WBC 4.6 09/03/2023   HGB 13.9 09/03/2023   HCT 40.2 09/03/2023   MCV 92.0 09/03/2023   PLT 128 (L) 09/03/2023      Component Value Date/Time   NA 138 04/13/2024 0949   K 4.4 04/13/2024 0949   CL 100 04/13/2024 0949   CO2 23 04/13/2024 0949   GLUCOSE 110 (H) 04/13/2024 0949   GLUCOSE 143 (H) 09/03/2023 1314   BUN 16  04/13/2024 0949   CREATININE 1.04 04/13/2024 0949   CREATININE 1.25 (  H) 09/03/2023 1314   CREATININE 1.05 06/20/2013 1212   CALCIUM  9.1 04/13/2024 0949   PROT 6.7 09/03/2023 1314   PROT 6.6 07/13/2018 1513   ALBUMIN 3.9 09/03/2023 1314   ALBUMIN 4.4 07/13/2018 1513   AST 14 (L) 09/03/2023 1314   ALT 10 09/03/2023 1314   ALKPHOS 60 09/03/2023 1314   BILITOT 0.7 09/03/2023 1314   GFRNONAA 56 (L) 09/03/2023 1314   GFRAA 69 08/24/2018 0950   No results found for: CHOL, HDL, LDLCALC, LDLDIRECT, TRIG, CHOLHDL No results found for: YHAJ8R No results found for: VITAMINB12 Lab Results  Component Value Date   TSH 3.190 07/13/2018        ASSESSMENT AND PLAN  89 y.o. year old male with    Polyneuropathy, left worse than right Chronic polyneuropathy for approximately 20 years, with the left more affected than the right. Symptoms include numbness, tingling, and occasional shooting pain in the toes and legs. No diabetes or other reversible causes identified. Neuropathy likely idiopathic causes. No significant pain requiring medication. Current management includes intermittent use of a foot cream and a foot vibrator, which provide relief. No falls or significant impact on mobility reported. - Ordered neuropathy panel to identify reversible causes. - Recommended over-the-counter alpha lipoic acid 600 mg daily. - Advised continued use of foot cream as needed for symptom relief. - Suggested lidocaine  cream for nighttime use if pain becomes bothersome.   1. Peripheral polyneuropathy     Patient Instructions  Will obtain neuropathy labs today  Continue with B12 supplement  Consider alpha lipoic acid 600 mg daily  Continue to follow up with Dr. Clarice  Return as needed   Orders Placed This Encounter  Procedures   Neuropathy Panel   Copper, serum    No orders of the defined types were placed in this encounter.   Return if symptoms worsen or fail to  improve.    Pastor Falling, MD 08/28/2024, 6:00 PM  [facility] [address] [place], [place] [address] [phone]

## 2024-08-28 NOTE — Patient Instructions (Signed)
 Will obtain neuropathy labs today  Continue with B12 supplement  Consider alpha lipoic acid 600 mg daily  Continue to follow up with Dr. Clarice  Return as needed

## 2024-08-29 ENCOUNTER — Inpatient Hospital Stay

## 2024-08-29 ENCOUNTER — Inpatient Hospital Stay: Admitting: Hematology

## 2024-09-01 LAB — NEUROPATHY PANEL
A/G Ratio: 1.3 (ref 0.7–1.7)
Albumin ELP: 3.8 g/dL (ref 2.9–4.4)
Alpha 1: 0.2 g/dL (ref 0.0–0.4)
Alpha 2: 0.6 g/dL (ref 0.4–1.0)
Angio Convert Enzyme: 28 U/L (ref 14–82)
Anti Nuclear Antibody (ANA): NEGATIVE
Beta: 1.5 g/dL — ABNORMAL HIGH (ref 0.7–1.3)
Gamma Globulin: 0.8 g/dL (ref 0.4–1.8)
Globulin, Total: 3 g/dL (ref 2.2–3.9)
M-Spike, %: 0.5 g/dL — ABNORMAL HIGH
Rheumatoid fact SerPl-aCnc: <10 [IU]/mL (ref <14.0)
Sed Rate: 4 mm/h (ref 0–30)
TSH: 2.95 u[IU]/mL (ref 0.450–4.500)
Total Protein: 6.8 g/dL (ref 6.0–8.5)
Vit D, 25-Hydroxy: 45.4 ng/mL (ref 30.0–100.0)
Vitamin B-12: 1502 pg/mL — ABNORMAL HIGH (ref 232–1245)

## 2024-09-01 LAB — COPPER, SERUM: Copper: 86 ug/dL (ref 69–132)

## 2024-09-04 ENCOUNTER — Ambulatory Visit: Payer: Self-pay | Admitting: Neurology

## 2024-09-04 NOTE — Progress Notes (Signed)
 Please call and advise the patient that the recent labs showed an elevated immunoglobulin. Please inform patient that I do not think this is causing your neuropathy but your PCP should repeat the test in one year. Please remind patient to keep any upcoming appointments or tests and to call us  with any interim questions, concerns, problems or updates. Thanks,   Pastor Falling, MD

## 2024-09-05 ENCOUNTER — Other Ambulatory Visit: Payer: Medicare HMO

## 2024-09-05 ENCOUNTER — Ambulatory Visit: Payer: Medicare HMO | Admitting: Hematology

## 2024-09-05 NOTE — Telephone Encounter (Addendum)
 Called pt and told him the Below Neuropathy Panel. Pt verbalized understanding and thanked my for calling.  ----- Message from Pastor Falling sent at 09/04/2024  5:30 PM EST ----- Please call and advise the patient that the recent labs showed an elevated immunoglobulin. Please inform patient that I do not think this is causing your neuropathy but your PCP should repeat the  test in one year. Please remind patient to keep any upcoming appointments or tests and to call us  with any interim questions, concerns, problems or updates. Thanks,   Pastor Falling, MD   ----- Message ----- From: Rebecka Memos Lab Results In Sent: 08/29/2024   7:37 AM EST To: Pastor Falling, MD

## 2024-09-18 ENCOUNTER — Other Ambulatory Visit: Payer: Self-pay | Admitting: Radiology

## 2024-09-18 DIAGNOSIS — N401 Enlarged prostate with lower urinary tract symptoms: Secondary | ICD-10-CM

## 2024-09-19 ENCOUNTER — Other Ambulatory Visit: Payer: Self-pay | Admitting: Radiology

## 2024-09-19 NOTE — H&P (Signed)
 Chief Complaint: Benign prostatic hypertrophy with lower urinary tract symptoms/nocturia; referred for bilateral prostate artery embolization  Referring Provider(s): Watt PARAS  Supervising Physician: Johann Sieving  Patient Status: [name] - Out-pt  History of Present Illness: Stanley Waters is an 89 y.o. male ex smoker with past medical history significant for atrial flutter/fibrillation with prior cardioversion/ablation, hyperlipidemia, hypertension, diverticulosis, colon polyps, peripheral neuropathy, skin cancer, vitamin D  deficiency as well as benign prostatic hypertrophy with LUTS/nocturia.  Patient was seen in consultation by Dr. Johann on 08/04/2024 to discuss treatment options for his BPH with LUTS and was deemed an appropriate candidate for bilateral prostate artery embolization.  He presents today for the procedure.  *** Patient is Full Code  Past Medical History:  Diagnosis Date   Atrial flutter (HCC)    Atypical chest pain    Myoview performed 07/23/11 was completely normal. Post stress EF 61%.   Bruit    Left asymptomatic bruit. Carotid Duplex 12/13/12 =mildly abnormal. *BILATERAL BULB/PROXIMAL ICAs: Demonstrated a mild amount of fibrous plaque w/no evidence od significant diameter reduction, tortuosity or other vascular abnormality.   Enlarged prostate    BPH - PSA of 6.7 this is consistant with his last PSA of 6.3   Hyperlipemia    Hypertension    Inguinal hernia    Inguinal hernia repair (x) 2 1991, 2011   Intestinal polyps 09/22/11   Colonoscopy removed a 2 mm sessile cecal polyp with a cold snare.   Measles    Mumps    Normal coronary arteries 2002   Peripheral neuropathy    Persistent atrial fibrillation (HCC)    Seasonal allergies    Skin cancer    Thrombocytopenia 2011   Very mild with platelets of 135,000.   Tinnitus    Umbilical hernia    Vitamin D  deficiency     Past Surgical History:  Procedure Laterality Date   ATRIAL FIBRILLATION ABLATION   09/20/2018   ATRIAL FIBRILLATION ABLATION N/A 09/20/2018   Procedure: ATRIAL FIBRILLATION ABLATION;  Surgeon: Kelsie Agent, MD;  Location: [facility] INVASIVE CV LAB;  Service: Cardiovascular;  Laterality: N/A;   CARDIAC CATHETERIZATION  2002   Normal cardiac catheterization   CARDIOVERSION  07/07/2012   A fib - Cardioversion successful.   CARDIOVERSION N/A 06/30/2013   Procedure: CARDIOVERSION;  Surgeon: Jerel Balding, MD;  Location: [facility] OR;  Service: Cardiovascular;  Laterality: N/A;   CARDIOVERSION N/A 08/02/2018   Procedure: CARDIOVERSION;  Surgeon: Pietro Redell RAMAN, MD;  Location: [facility] ENDOSCOPY;  Service: Cardiovascular;  Laterality: N/A;   Carotid Duplex  12/13/12   For asymptomatic bruit. Mildly abnormal.  *BILATERAL BULB/PROXIMAL ICAs: Demonstrated a mild amount of fibrous plaque w/no evidence of significant diameter reduction, tortuosity or other vascular abnormality.    COLONOSCOPY W/ POLYPECTOMY  09/22/11   2 mm sessile cecal polyp was removed with a cold snare.   HAND SURGERY     INGUINAL HERNIA REPAIR  1991 & 2011   TEE WITHOUT CARDIOVERSION  07/07/2012   Procedure: TRANSESOPHAGEAL ECHOCARDIOGRAM (TEE);  Surgeon: Vinie KYM Maxcy, MD;  Location: [facility] ENDOSCOPY;  Service: Cardiovascular;  Laterality: N/A;   TEE WITHOUT CARDIOVERSION N/A 08/02/2018   Procedure: TRANSESOPHAGEAL ECHOCARDIOGRAM (TEE);  Surgeon: Pietro Redell RAMAN, MD;  Location: [facility] ENDOSCOPY;  Service: Cardiovascular;  Laterality: N/A;    Allergies: Prednisone , Trazodone  hcl, and Doxycycline  Medications: Prior to Admission medications   Medication Sig Start Date End Date Taking? Authorizing Provider  Calcium  Citrate 250 MG TABS Take 1 tablet (250 mg total)  by mouth daily with breakfast. 12/25/22   Fenton, Clint R, PA  Cholecalciferol  (VITAMIN D3) 2000 units TABS Take 2,000 Units by mouth daily.    [provider]  Cyanocobalamin  (B-12) 2500 MCG SUBL Place 1 tablet under the tongue daily.    [provider]   dofetilide  (TIKOSYN ) 250 MCG capsule Take 1 capsule (250 mcg total) by mouth 2 (two) times daily. 07/06/24   Lesia Ozell Barter, PA-C  doxazosin  (CARDURA ) 8 MG tablet Take 1 tablet (8 mg total) by mouth at bedtime. 10/04/13   Court Dorn PARAS, MD  ezetimibe (ZETIA) 10 MG tablet Take 10 mg by mouth daily.    [provider]  furosemide  (LASIX ) 20 MG tablet Take 20 mg by mouth daily.    [provider]  lisinopril  (ZESTRIL ) 20 MG tablet Take 20 mg by mouth daily as needed (Blood pressure greater then 160).    [provider]  multivitamin (ONE-A-DAY MEN'S) TABS tablet Take 1 tablet by mouth daily. 12/25/22   Fenton, Clint R, PA  nitroGLYCERIN  (NITROSTAT ) 0.4 MG SL tablet PLACE 1 TABLET UNDER THE TONGUE EVERY 5 MINUTES AS NEEDED FOR CHEST PAIN 07/20/22   Kareem Cathey, [place], MD  polyethylene glycol (MIRALAX  / GLYCOLAX ) packet Take 17 g by mouth daily.    [provider]  rivaroxaban  (XARELTO ) 20 MG TABS tablet Take 1 tablet (20 mg total) by mouth daily with supper. 12/30/22   Fenton, Clint R, PA  rOPINIRole  (REQUIP ) 0.5 MG tablet Take 0.5 mg by mouth at bedtime. 06/26/24   [provider]  vitamin C  (ASCORBIC ACID) 250 MG tablet Take 1 tablet (250 mg total) by mouth daily. 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  Tobacco Use   Smoking status: Former    Current packs/day: 0.00    Types: Cigarettes    Quit date: 11/03/1963    Years since quitting: 60.9   Smokeless tobacco: Never   Tobacco comments:    Former smoker 12/21/22  Vaping Use   Vaping status: Never Used  Substance and Sexual Activity   Alcohol use: No   Drug  use: No   Sexual activity: Not Currently  Other Topics Concern   Not on file  Social History Narrative   Lives in [place], alone   Retired from human resources officer   Social Drivers of Health   Financial Resource Strain: Not on file  Food Insecurity: No Food Insecurity (12/23/2022)   Hunger Vital Sign    Worried About Running Out of Food in the Last Year: Never true    Ran Out of Food in the Last Year: Never true  Transportation Needs: No Transportation Needs (12/23/2022)   PRAPARE - Administrator, Civil Service (Medical): No    Lack of Transportation (Non-Medical): No  Physical Activity: Not on file  Stress: Not on file  Social Connections: Unknown (09/27/2022)   Received from [facility]   Social Network    Social Network: Not on file       Review of Systems  Vital Signs:  Advance Care Plan: No documents on file  Physical Exam  Imaging: No results found.  Labs:  CBC: No results for input(s): WBC, HGB, HCT, PLT in the last 8760 hours.  COAGS: No results for input(s): INR, APTT in the last 8760 hours.  BMP: Recent Labs    04/13/24 0949  NA 138  K 4.4  CL 100  CO2 23  GLUCOSE 110*  BUN 16  CALCIUM  9.1  CREATININE 1.04    LIVER FUNCTION TESTS: Recent Labs    08/28/24 1015  PROT 6.8    TUMOR MARKERS: No results for input(s): AFPTM, CEA, CA199, CHROMGRNA in the last 8760 hours.  Assessment and Plan: 89 y.o. male ex smoker with past medical history significant for atrial flutter/fibrillation with prior cardioversion/ablation, hyperlipidemia, hypertension, diverticulosis, colon polyps, peripheral neuropathy, skin cancer, vitamin D  deficiency as well as benign prostatic hypertrophy with LUTS/nocturia.  Patient was seen in consultation by Dr. Johann on 08/04/2024 to discuss treatment options for his BPH with LUTS and was deemed an appropriate candidate for bilateral prostate artery embolization.  He presents today for  the procedure.Risks and benefits of procedure were discussed with the patient including, but not limited to bleeding, infection, vascular injury or contrast induced renal failure.  This interventional procedure involves the use of X-rays and because of the nature of the planned procedure, it is possible that we will have prolonged use of X-ray fluoroscopy.  Potential radiation risks to you include (but are not limited to) the following: - A slightly elevated risk for cancer  several years later in life. This risk is typically less than 0.5% percent. This risk is low in comparison to the normal incidence of human cancer, which is 33% for women and 50% for men according to the American Cancer Society. - Radiation induced injury can include skin redness, resembling a rash, tissue breakdown / ulcers and hair loss (which can be temporary or permanent).   The likelihood of either of these occurring depends on the difficulty of the procedure and whether you are sensitive to radiation due to previous procedures, disease, or genetic conditions.   IF your procedure requires a prolonged use of radiation, you will be notified and given written instructions for further action.  It is your responsibility to monitor the irradiated area for the 2 weeks following the procedure and to notify your physician if you are concerned that you have suffered a radiation induced injury.    All of the patient's questions were answered, patient is agreeable to proceed.  Consent signed and in chart.      Thank you for allowing our service to participate in AMAURI KEEFE 's care.  Electronically Signed: D. Franky Rakers, PA-C   09/19/2024, 2:41 PM      I spent a total of  25 minutes   in face to face in clinical consultation, greater than 50% of which was counseling/coordinating care for image guided bilateral prostate artery embolization

## 2024-09-20 ENCOUNTER — Other Ambulatory Visit (HOSPITAL_COMMUNITY): Payer: Self-pay

## 2024-09-20 ENCOUNTER — Other Ambulatory Visit: Payer: Self-pay

## 2024-09-20 ENCOUNTER — Encounter (HOSPITAL_COMMUNITY): Payer: Self-pay

## 2024-09-20 ENCOUNTER — Other Ambulatory Visit (HOSPITAL_COMMUNITY): Payer: Self-pay | Admitting: Interventional Radiology

## 2024-09-20 ENCOUNTER — Ambulatory Visit (HOSPITAL_COMMUNITY)
Admission: RE | Admit: 2024-09-20 | Discharge: 2024-09-20 | Disposition: A | Discharge status: Home / Self Care | Source: Ambulatory Visit | Attending: Interventional Radiology | Admitting: Interventional Radiology

## 2024-09-20 VITALS — BP 170/76 | HR 65 | Temp 97.7°F | Resp 16 | Ht 72.0 in | Wt 195.0 lb

## 2024-09-20 DIAGNOSIS — N401 Enlarged prostate with lower urinary tract symptoms: Secondary | ICD-10-CM

## 2024-09-20 DIAGNOSIS — N400 Enlarged prostate without lower urinary tract symptoms: Secondary | ICD-10-CM | POA: Diagnosis not present

## 2024-09-20 HISTORY — PX: IR US GUIDE VASC ACCESS RIGHT: IMG2390

## 2024-09-20 HISTORY — PX: IR 3D INDEPENDENT WKST: IMG2385

## 2024-09-20 HISTORY — PX: IR US GUIDE VASC ACCESS LEFT: IMG2389

## 2024-09-20 HISTORY — PX: IR EMBO TUMOR ORGAN ISCHEMIA INFARCT INC GUIDE ROADMAPPING: IMG5449

## 2024-09-20 HISTORY — PX: IR ANGIOGRAM SELECTIVE EACH ADDITIONAL VESSEL: IMG667

## 2024-09-20 HISTORY — PX: IR ANGIOGRAM PELVIS SELECTIVE OR SUPRASELECTIVE: IMG661

## 2024-09-20 LAB — COMPREHENSIVE METABOLIC PANEL WITH GFR
ALT: 11 U/L (ref 0–44)
AST: 18 U/L (ref 15–41)
Albumin: 4.1 g/dL (ref 3.5–5.0)
Alkaline Phosphatase: 65 U/L (ref 38–126)
Anion gap: 10 (ref 5–15)
BUN: 14 mg/dL (ref 8–23)
CO2: 27 mmol/L (ref 22–32)
Calcium: 9 mg/dL (ref 8.9–10.3)
Chloride: 102 mmol/L (ref 98–111)
Creatinine, Ser: 0.92 mg/dL (ref 0.61–1.24)
GFR, Estimated: >60 mL/min (ref >60)
Glucose, Bld: 111 mg/dL — ABNORMAL HIGH (ref 70–99)
Potassium: 4.1 mmol/L (ref 3.5–5.1)
Sodium: 138 mmol/L (ref 135–145)
Total Bilirubin: 0.9 mg/dL (ref 0.0–1.2)
Total Protein: 7.2 g/dL (ref 6.5–8.1)

## 2024-09-20 LAB — CBC WITH DIFFERENTIAL/PLATELET
Abs Immature Granulocytes: 0.03 K/uL (ref 0.00–0.07)
Basophils Absolute: 0 K/uL (ref 0.0–0.1)
Basophils Relative: 1 %
Eosinophils Absolute: 0.1 K/uL (ref 0.0–0.5)
Eosinophils Relative: 2 %
HCT: 44 % (ref 39.0–52.0)
Hemoglobin: 15.1 g/dL (ref 13.0–17.0)
Immature Granulocytes: 1 %
Lymphocytes Relative: 12 %
Lymphs Abs: 0.5 K/uL — ABNORMAL LOW (ref 0.7–4.0)
MCH: 31.8 pg (ref 26.0–34.0)
MCHC: 34.3 g/dL (ref 30.0–36.0)
MCV: 92.6 fL (ref 80.0–100.0)
Monocytes Absolute: 0.4 K/uL (ref 0.1–1.0)
Monocytes Relative: 8 %
Neutro Abs: 3.5 K/uL (ref 1.7–7.7)
Neutrophils Relative %: 76 %
Platelets: 125 K/uL — ABNORMAL LOW (ref 150–400)
RBC: 4.75 MIL/uL (ref 4.22–5.81)
RDW: 12.7 % (ref 11.5–15.5)
WBC: 4.6 K/uL (ref 4.0–10.5)
nRBC: 0 % (ref 0.0–0.2)

## 2024-09-20 LAB — PROTIME-INR
INR: 1.2 (ref 0.8–1.2)
Prothrombin Time: 16.1 s — ABNORMAL HIGH (ref 11.4–15.2)

## 2024-09-20 MED ORDER — MIDAZOLAM HCL (PF) 2 MG/2ML IJ SOLN
INTRAMUSCULAR | Status: AC | PRN
  Administered 2024-09-20: 1 mg via INTRAVENOUS

## 2024-09-20 MED ORDER — LIDOCAINE HCL 1 % IJ SOLN
20.0000 mL | Freq: Once | INTRAMUSCULAR | Status: AC
  Administered 2024-09-20: 5 mL via INTRADERMAL

## 2024-09-20 MED ORDER — MIDAZOLAM HCL (PF) 2 MG/2ML IJ SOLN
INTRAMUSCULAR | Status: AC | PRN
Start: 2024-09-20 — End: 2024-09-20
  Administered 2024-09-20: 1 mg via INTRAVENOUS

## 2024-09-20 MED ORDER — FENTANYL CITRATE (PF) 100 MCG/2ML IJ SOLN
INTRAMUSCULAR | Status: AC | PRN
  Administered 2024-09-20: 50 ug via INTRAVENOUS

## 2024-09-20 MED ORDER — FENTANYL CITRATE (PF) 100 MCG/2ML IJ SOLN
INTRAMUSCULAR | Status: AC
  Filled 2024-09-20: qty 2

## 2024-09-20 MED ORDER — CHLORHEXIDINE GLUCONATE CLOTH 2 % EX PADS: 6.0000 | MEDICATED_PAD | Freq: Every day | CUTANEOUS | Status: DC

## 2024-09-20 MED ORDER — SOLIFENACIN SUCCINATE 5 MG PO TABS
5.0000 mg | ORAL_TABLET | Freq: Every day | ORAL | 0 refills | Status: AC
  Filled 2024-09-20: qty 7, 7d supply, fill #0

## 2024-09-20 MED ORDER — SODIUM CHLORIDE 0.9 % IV SOLN
2.0000 g | Freq: Once | INTRAVENOUS | Status: AC
  Administered 2024-09-20: 2 g via INTRAVENOUS

## 2024-09-20 MED ORDER — CIPROFLOXACIN HCL 500 MG PO TABS
500.0000 mg | ORAL_TABLET | Freq: Two times a day (BID) | ORAL | 0 refills | Status: DC
  Filled 2024-09-20: qty 14, 7d supply, fill #0

## 2024-09-20 MED ORDER — SODIUM CHLORIDE 0.9 % IV SOLN
INTRAVENOUS | Status: AC
  Filled 2024-09-20: qty 20

## 2024-09-20 MED ORDER — NITROGLYCERIN IN D5W 100-5 MCG/ML-% IV SOLN
INTRAVENOUS | Status: AC
  Filled 2024-09-20: qty 250

## 2024-09-20 MED ORDER — OXYCODONE-ACETAMINOPHEN 5-325 MG PO TABS
1.0000 | ORAL_TABLET | Freq: Four times a day (QID) | ORAL | 0 refills | Status: AC | PRN
  Filled 2024-09-20: qty 20, 5d supply, fill #0

## 2024-09-20 MED ORDER — MIDAZOLAM HCL 2 MG/2ML IJ SOLN
INTRAMUSCULAR | Status: AC
  Filled 2024-09-20: qty 2

## 2024-09-20 MED ORDER — PHENAZOPYRIDINE HCL 100 MG PO TABS
100.0000 mg | ORAL_TABLET | Freq: Three times a day (TID) | ORAL | 0 refills | Status: AC | PRN
  Filled 2024-09-20: qty 21, 7d supply, fill #0

## 2024-09-20 MED ORDER — FENTANYL CITRATE (PF) 100 MCG/2ML IJ SOLN
INTRAMUSCULAR | Status: AC | PRN
  Administered 2024-09-20 (×2): 50 ug via INTRAVENOUS

## 2024-09-20 MED ORDER — MIDAZOLAM HCL (PF) 2 MG/2ML IJ SOLN
INTRAMUSCULAR | Status: AC | PRN
  Administered 2024-09-20 (×2): 1 mg via INTRAVENOUS

## 2024-09-20 MED ORDER — LIDOCAINE HCL 1 % IJ SOLN
INTRAMUSCULAR | Status: AC
Start: 2024-09-20 — End: 2024-09-20
  Filled 2024-09-20: qty 20

## 2024-09-20 MED ORDER — NITROGLYCERIN 1 MG/10 ML FOR IR/CATH LAB
INTRA_ARTERIAL | Status: AC | PRN
  Administered 2024-09-20: 50 ug via INTRA_ARTERIAL

## 2024-09-20 MED ORDER — IOHEXOL 300 MG/ML  SOLN
300.0000 mL | Freq: Once | INTRAMUSCULAR | Status: AC | PRN
  Administered 2024-09-20: 150 mL via INTRA_ARTERIAL

## 2024-09-20 MED ORDER — SODIUM CHLORIDE 0.9 % IV SOLN: INTRAVENOUS | Status: DC

## 2024-09-20 NOTE — Procedures (Signed)
  Procedure:  Left prostate artery embolization unable to catheterize R pros art Preprocedure diagnosis: The primary encounter diagnosis was Benign prostatic hyperplasia with lower urinary tract symptoms, symptom details unspecified. A diagnosis of BPH with lower urinary tract symptoms without urinary obstruction was also pertinent to this visit. Postprocedure diagnosis: same EBL:    minimal Complications:   none immediate  See full dictation in [employer].  CHARM Toribio Faes MD Main # [phone] Pager  [phone] Mobile [phone]

## 2024-09-20 NOTE — Discharge Instructions (Signed)
 Please call Interventional Radiology clinic [phone] with any questions or concerns.  You may remove your dressing and shower tomorrow.  After the procedure, it is common to have: Increased frequency of urination Mild pressure or discomfort in the low belly Blood in the urine and/or painful urination for 7-14 days  Blood in the semen for 7-21 days Pressure, pain, or the urge to have a bowel movement when urinating and/or a small amount of blood in the stool for 4-7 days Low-grade fever (temperature less than 101.5 degrees Fahrenheit or 38.6 degrees Celsius) for 4-7 days Tenderness and/or bruising around the puncture site. Bruising can take 2-3 weeks to go away completely. Please call immediately if you feel a lump that is growing or varies with your pulse  Follow these instructions at home:  Medication: Do not use Aspirin or ibuprofen products, such as Advil or Motrin, as it may increase bleeding You may resume your usual medications as ordered by your doctor If your doctor prescribed antibiotics, take them as directed. Do not stop taking them just because you feel better  Eating and drinking: Drink plenty of liquids to keep your urine pale yellow You can resume your regular diet as directed by your doctor   Activity For procedures where we entered your artery from the top of your leg (groin):  Avoid strenuous activity, such as climbing long flights of stairs, for 24 hours after your procedure  No heavy lifting (greater than 15-20 pounds or 6-9 kg) for 7 days or until the puncture site heels Do not take baths, swim, or use a hot tub until your health care provider approves. Take showers only Keep all follow-up visits as told by your doctor  Care of the procedure site Follow instructions from your health care provider about how to take care of the puncture site.  Wash your hands with soap and water before you change your bandage (dressing). If soap and water are not available, use  hand sanitizer Change your dressing as told by your health care provider Leave stitches (sutures), skin glue, or adhesive strips in place. These skin closures may need to stay in place for 2 weeks or longer. If adhesive strip edges start to loosen and curl up, you may trim the loose edges. Do not remove adhesive strips completely unless your health care provider tells you to do that Check your puncture site every day for signs of infection. Check for: More redness, swelling, or pain Warmth/heat at puncture site Pus or a bad odor  Contact a health care provider if: You have a fever You have more redness, swelling, or pain around your incision You have more fluid or blood coming from your incision site Your incision feels warm to the touch You have pus or a bad smell coming from your incision You have a rash You have nausea, or you cannot eat or drink anything without vomiting  Get help right away if: You have trouble breathing You have chest pain You have severe pain in your abdomen, and it does not get better with medicine You have leg pain or leg swelling You feel dizzy, or you faint   Moderate Conscious Sedation-Care After  This sheet gives you information about how to care for yourself after your procedure. Your health care provider may also give you more specific instructions. If you have problems or questions, contact your health care provider.  After the procedure, it is common to have: Sleepiness for several hours. Impaired judgment for several hours.  Difficulty with balance. Vomiting if you eat too soon.  Follow these instructions at home:  Rest. Do not participate in activities where you could fall or become injured. Do not drive or use machinery. Do not drink alcohol. Do not take sleeping pills or medicines that cause drowsiness. Do not make important decisions or sign legal documents. Do not take care of children on your own.  Eating and drinking Follow the  diet recommended by your health care provider. Drink enough fluid to keep your urine pale yellow. If you vomit: Drink water, juice, or soup when you can drink without vomiting. Make sure you have little or no nausea before eating solid foods.  General instructions Take over-the-counter and prescription medicines only as told by your health care provider. Have a responsible adult stay with you for the time you are told. It is important to have someone help care for you until you are awake and alert. Do not smoke. Keep all follow-up visits as told by your health care provider. This is important.  Contact a health care provider if: You are still sleepy or having trouble with balance after 24 hours. You feel light-headed. You keep feeling nauseous or you keep vomiting. You develop a rash. You have a fever. You have redness or swelling around the IV site.  Get help right away if: You have trouble breathing. You have new-onset confusion at home.  This information is not intended to replace advice given to you by your health care provider. Make sure you discuss any questions you have with your healthcare provider.

## 2024-09-22 ENCOUNTER — Inpatient Hospital Stay (HOSPITAL_COMMUNITY)
Admission: EM | Admit: 2024-09-22 | Discharge: 2024-09-25 | DRG: 726 | Disposition: A | Discharge status: Home / Self Care | Attending: Internal Medicine | Admitting: Internal Medicine

## 2024-09-22 ENCOUNTER — Other Ambulatory Visit: Payer: Self-pay

## 2024-09-22 ENCOUNTER — Emergency Department (HOSPITAL_COMMUNITY)

## 2024-09-22 DIAGNOSIS — D6832 Hemorrhagic disorder due to extrinsic circulating anticoagulants: Secondary | ICD-10-CM | POA: Diagnosis present

## 2024-09-22 DIAGNOSIS — R310 Gross hematuria: Secondary | ICD-10-CM | POA: Diagnosis present

## 2024-09-22 DIAGNOSIS — R339 Retention of urine, unspecified: Secondary | ICD-10-CM | POA: Diagnosis not present

## 2024-09-22 DIAGNOSIS — N400 Enlarged prostate without lower urinary tract symptoms: Secondary | ICD-10-CM | POA: Diagnosis not present

## 2024-09-22 DIAGNOSIS — N401 Enlarged prostate with lower urinary tract symptoms: Secondary | ICD-10-CM | POA: Diagnosis not present

## 2024-09-22 DIAGNOSIS — R1024 Suprapubic pain: Secondary | ICD-10-CM | POA: Diagnosis not present

## 2024-09-22 DIAGNOSIS — Y842 Radiological procedure and radiotherapy as the cause of abnormal reaction of the patient, or of later complication, without mention of misadventure at the time of the procedure: Secondary | ICD-10-CM | POA: Diagnosis present

## 2024-09-22 DIAGNOSIS — R140 Abdominal distension (gaseous): Secondary | ICD-10-CM | POA: Diagnosis present

## 2024-09-22 DIAGNOSIS — Z87891 Personal history of nicotine dependence: Secondary | ICD-10-CM

## 2024-09-22 DIAGNOSIS — R52 Pain, unspecified: Secondary | ICD-10-CM

## 2024-09-22 DIAGNOSIS — R319 Hematuria, unspecified: Secondary | ICD-10-CM | POA: Diagnosis not present

## 2024-09-22 DIAGNOSIS — Z79899 Other long term (current) drug therapy: Secondary | ICD-10-CM

## 2024-09-22 DIAGNOSIS — I4819 Other persistent atrial fibrillation: Secondary | ICD-10-CM | POA: Diagnosis present

## 2024-09-22 DIAGNOSIS — I700 Atherosclerosis of aorta: Secondary | ICD-10-CM | POA: Diagnosis present

## 2024-09-22 DIAGNOSIS — Z85828 Personal history of other malignant neoplasm of skin: Secondary | ICD-10-CM

## 2024-09-22 DIAGNOSIS — D696 Thrombocytopenia, unspecified: Secondary | ICD-10-CM

## 2024-09-22 DIAGNOSIS — R112 Nausea with vomiting, unspecified: Secondary | ICD-10-CM | POA: Diagnosis present

## 2024-09-22 DIAGNOSIS — R58 Hemorrhage, not elsewhere classified: Principal | ICD-10-CM

## 2024-09-22 DIAGNOSIS — N281 Cyst of kidney, acquired: Secondary | ICD-10-CM | POA: Diagnosis not present

## 2024-09-22 DIAGNOSIS — Z7982 Long term (current) use of aspirin: Secondary | ICD-10-CM

## 2024-09-22 DIAGNOSIS — Z860100 Personal history of colon polyps, unspecified: Secondary | ICD-10-CM

## 2024-09-22 DIAGNOSIS — Z7901 Long term (current) use of anticoagulants: Secondary | ICD-10-CM

## 2024-09-22 DIAGNOSIS — R161 Splenomegaly, not elsewhere classified: Secondary | ICD-10-CM | POA: Diagnosis not present

## 2024-09-22 DIAGNOSIS — K8020 Calculus of gallbladder without cholecystitis without obstruction: Secondary | ICD-10-CM | POA: Diagnosis not present

## 2024-09-22 DIAGNOSIS — Z8249 Family history of ischemic heart disease and other diseases of the circulatory system: Secondary | ICD-10-CM

## 2024-09-22 DIAGNOSIS — Z881 Allergy status to other antibiotic agents status: Secondary | ICD-10-CM

## 2024-09-22 DIAGNOSIS — E785 Hyperlipidemia, unspecified: Secondary | ICD-10-CM | POA: Diagnosis present

## 2024-09-22 DIAGNOSIS — N3289 Other specified disorders of bladder: Secondary | ICD-10-CM | POA: Diagnosis present

## 2024-09-22 DIAGNOSIS — R1030 Lower abdominal pain, unspecified: Secondary | ICD-10-CM | POA: Diagnosis not present

## 2024-09-22 DIAGNOSIS — I110 Hypertensive heart disease with heart failure: Secondary | ICD-10-CM | POA: Diagnosis present

## 2024-09-22 DIAGNOSIS — I5032 Chronic diastolic (congestive) heart failure: Secondary | ICD-10-CM | POA: Diagnosis present

## 2024-09-22 DIAGNOSIS — R338 Other retention of urine: Secondary | ICD-10-CM | POA: Diagnosis present

## 2024-09-22 DIAGNOSIS — R300 Dysuria: Secondary | ICD-10-CM | POA: Diagnosis not present

## 2024-09-22 DIAGNOSIS — Z885 Allergy status to narcotic agent status: Secondary | ICD-10-CM

## 2024-09-22 LAB — CBC WITH DIFFERENTIAL/PLATELET
Abs Immature Granulocytes: 0.06 K/uL (ref 0.00–0.07)
Basophils Absolute: 0 K/uL (ref 0.0–0.1)
Basophils Relative: 0 %
Eosinophils Absolute: 0 K/uL (ref 0.0–0.5)
Eosinophils Relative: 0 %
HCT: 42.1 % (ref 39.0–52.0)
Hemoglobin: 14.3 g/dL (ref 13.0–17.0)
Immature Granulocytes: 1 %
Lymphocytes Relative: 4 %
Lymphs Abs: 0.4 K/uL — ABNORMAL LOW (ref 0.7–4.0)
MCH: 32 pg (ref 26.0–34.0)
MCHC: 34 g/dL (ref 30.0–36.0)
MCV: 94.2 fL (ref 80.0–100.0)
Monocytes Absolute: 0.8 K/uL (ref 0.1–1.0)
Monocytes Relative: 8 %
Neutro Abs: 9.1 K/uL — ABNORMAL HIGH (ref 1.7–7.7)
Neutrophils Relative %: 87 %
Platelets: 108 K/uL — ABNORMAL LOW (ref 150–400)
RBC: 4.47 MIL/uL (ref 4.22–5.81)
RDW: 12.8 % (ref 11.5–15.5)
WBC: 10.4 K/uL (ref 4.0–10.5)
nRBC: 0 % (ref 0.0–0.2)

## 2024-09-22 LAB — URINALYSIS, ROUTINE W REFLEX MICROSCOPIC
Bilirubin Urine: NEGATIVE
Glucose, UA: NEGATIVE mg/dL
Ketones, ur: NEGATIVE mg/dL
Leukocytes,Ua: NEGATIVE
Nitrite: POSITIVE — AB
Protein, ur: 30 mg/dL — AB
RBC / HPF: >50 RBC/hpf (ref 0–5)
Specific Gravity, Urine: 1.01 (ref 1.005–1.030)
pH: 6 (ref 5.0–8.0)

## 2024-09-22 LAB — COMPREHENSIVE METABOLIC PANEL WITH GFR
ALT: 11 U/L (ref 0–44)
AST: 26 U/L (ref 15–41)
Albumin: 4 g/dL (ref 3.5–5.0)
Alkaline Phosphatase: 62 U/L (ref 38–126)
Anion gap: 10 (ref 5–15)
BUN: 15 mg/dL (ref 8–23)
CO2: 24 mmol/L (ref 22–32)
Calcium: 8.9 mg/dL (ref 8.9–10.3)
Chloride: 99 mmol/L (ref 98–111)
Creatinine, Ser: 1.22 mg/dL (ref 0.61–1.24)
GFR, Estimated: 57 mL/min — ABNORMAL LOW (ref >60)
Glucose, Bld: 142 mg/dL — ABNORMAL HIGH (ref 70–99)
Potassium: 4.2 mmol/L (ref 3.5–5.1)
Sodium: 132 mmol/L — ABNORMAL LOW (ref 135–145)
Total Bilirubin: 1.4 mg/dL — ABNORMAL HIGH (ref 0.0–1.2)
Total Protein: 6.9 g/dL (ref 6.5–8.1)

## 2024-09-22 MED ORDER — FUROSEMIDE 40 MG PO TABS
20.0000 mg | ORAL_TABLET | Freq: Every day | ORAL | Status: DC
  Administered 2024-09-22 – 2024-09-25 (×4): 20 mg via ORAL
  Filled 2024-09-22 (×4): qty 1

## 2024-09-22 MED ORDER — HYDROMORPHONE HCL 1 MG/ML IJ SOLN
0.5000 mg | Freq: Once | INTRAMUSCULAR | Status: AC
  Administered 2024-09-22: 0.5 mg via INTRAVENOUS
  Filled 2024-09-22: qty 1

## 2024-09-22 MED ORDER — OXYCODONE-ACETAMINOPHEN 5-325 MG PO TABS
1.0000 | ORAL_TABLET | Freq: Four times a day (QID) | ORAL | Status: DC | PRN
  Administered 2024-09-22 – 2024-09-25 (×5): 1 via ORAL
  Filled 2024-09-22 (×5): qty 1

## 2024-09-22 MED ORDER — POLYETHYLENE GLYCOL 3350 17 G PO PACK
17.0000 g | PACK | Freq: Every day | ORAL | Status: DC
  Administered 2024-09-22 – 2024-09-24 (×3): 17 g via ORAL
  Filled 2024-09-22 (×3): qty 1

## 2024-09-22 MED ORDER — SODIUM CHLORIDE 0.9 % IV SOLN
1.0000 g | Freq: Once | INTRAVENOUS | Status: AC
  Administered 2024-09-22: 1 g via INTRAVENOUS
  Filled 2024-09-22: qty 10

## 2024-09-22 MED ORDER — ACETAMINOPHEN 325 MG PO TABS: 650.0000 mg | ORAL_TABLET | Freq: Four times a day (QID) | ORAL | Status: DC | PRN

## 2024-09-22 MED ORDER — HYDROMORPHONE HCL 1 MG/ML IJ SOLN
1.0000 mg | Freq: Once | INTRAMUSCULAR | Status: AC
  Administered 2024-09-22: 1 mg via INTRAVENOUS
  Filled 2024-09-22: qty 1

## 2024-09-22 MED ORDER — EZETIMIBE 10 MG PO TABS
10.0000 mg | ORAL_TABLET | Freq: Every day | ORAL | Status: DC
  Administered 2024-09-22 – 2024-09-25 (×4): 10 mg via ORAL
  Filled 2024-09-22 (×4): qty 1

## 2024-09-22 MED ORDER — ONDANSETRON HCL 4 MG PO TABS: 4.0000 mg | ORAL_TABLET | Freq: Four times a day (QID) | ORAL | Status: DC | PRN

## 2024-09-22 MED ORDER — ONDANSETRON HCL 4 MG/2ML IJ SOLN: 4.0000 mg | Freq: Four times a day (QID) | INTRAMUSCULAR | Status: DC | PRN

## 2024-09-22 MED ORDER — ROPINIROLE HCL 1 MG PO TABS
0.5000 mg | ORAL_TABLET | Freq: Every day | ORAL | Status: DC
  Administered 2024-09-22 – 2024-09-24 (×3): 0.5 mg via ORAL
  Filled 2024-09-22 (×3): qty 1

## 2024-09-22 MED ORDER — DOFETILIDE 250 MCG PO CAPS
250.0000 ug | ORAL_CAPSULE | Freq: Two times a day (BID) | ORAL | Status: DC
  Administered 2024-09-22 – 2024-09-25 (×6): 250 ug via ORAL
  Filled 2024-09-22 (×7): qty 1

## 2024-09-22 MED ORDER — FENTANYL CITRATE (PF) 50 MCG/ML IJ SOSY
25.0000 ug | PREFILLED_SYRINGE | Freq: Once | INTRAMUSCULAR | Status: AC
  Administered 2024-09-22: 25 ug via INTRAVENOUS
  Filled 2024-09-22: qty 1

## 2024-09-22 MED ORDER — IOHEXOL 300 MG/ML  SOLN
100.0000 mL | Freq: Once | INTRAMUSCULAR | Status: AC | PRN
  Administered 2024-09-22: 100 mL via INTRAVENOUS

## 2024-09-22 MED ORDER — LISINOPRIL 10 MG PO TABS: 20.0000 mg | ORAL_TABLET | Freq: Every day | ORAL | Status: DC | PRN

## 2024-09-22 MED ORDER — DOXAZOSIN MESYLATE 8 MG PO TABS
8.0000 mg | ORAL_TABLET | Freq: Every day | ORAL | Status: DC
  Administered 2024-09-22 – 2024-09-24 (×3): 8 mg via ORAL
  Filled 2024-09-22 (×4): qty 1

## 2024-09-22 MED ORDER — SODIUM CHLORIDE 0.9 % IV SOLN
1.0000 g | INTRAVENOUS | Status: DC
  Administered 2024-09-23 – 2024-09-25 (×3): 1 g via INTRAVENOUS
  Filled 2024-09-22 (×3): qty 10

## 2024-09-22 MED ORDER — CHLORHEXIDINE GLUCONATE CLOTH 2 % EX PADS
6.0000 | MEDICATED_PAD | Freq: Every day | CUTANEOUS | Status: DC
  Administered 2024-09-22 – 2024-09-25 (×4): 6 via TOPICAL

## 2024-09-22 MED ORDER — SODIUM CHLORIDE 0.9 % IV BOLUS
500.0000 mL | Freq: Once | INTRAVENOUS | Status: AC
  Administered 2024-09-22: 500 mL via INTRAVENOUS

## 2024-09-22 MED ORDER — ACETAMINOPHEN 650 MG RE SUPP: 650.0000 mg | Freq: Four times a day (QID) | RECTAL | Status: DC | PRN

## 2024-09-22 MED ORDER — HYDROMORPHONE HCL 1 MG/ML IJ SOLN
0.5000 mg | INTRAMUSCULAR | Status: DC | PRN
  Administered 2024-09-24: 1 mg via INTRAVENOUS
  Filled 2024-09-22 (×2): qty 1

## 2024-09-22 MED ORDER — PHENAZOPYRIDINE HCL 100 MG PO TABS
100.0000 mg | ORAL_TABLET | Freq: Three times a day (TID) | ORAL | Status: DC | PRN
Start: 2024-09-22 — End: 2024-09-23
  Administered 2024-09-22 – 2024-09-23 (×2): 100 mg via ORAL
  Filled 2024-09-22 (×4): qty 1

## 2024-09-22 MED ORDER — FESOTERODINE FUMARATE ER 4 MG PO TB24
4.0000 mg | ORAL_TABLET | Freq: Every day | ORAL | Status: DC
  Filled 2024-09-22: qty 1

## 2024-09-22 MED ORDER — CIPROFLOXACIN HCL 500 MG PO TABS: 500.0000 mg | ORAL_TABLET | Freq: Two times a day (BID) | ORAL | Status: DC

## 2024-09-22 NOTE — ED Notes (Addendum)
 Urology at bedside, placing Foley catheter

## 2024-09-22 NOTE — H&P (Signed)
 History and Physical    Stanley Waters FMW:[id number] DOB: [birth year] DOA: 09/22/2024  PCP: Clarice Nottingham, MD (Confirm with patient/family/NH records and if not entered, this has to be entered at [facility] point of entry) Patient coming from: Home  I have personally briefly reviewed patient's old medical records in [facility] Health Link  Chief Complaint: Feeling better  HPI: Stanley Waters is a 89 y.o. male with medical history significant of severe BPH with severe urethral stenosis, status post recent left side prostate artery embolization on September 20, 2024, PAF status post cardioversion on Tikosyn  and Xarelto , HTN, HLD, peripheral neuropathy, presented with worsening hematuria, blood clots in urine and acute urinary retention.  Patient has a history of severe prostate enlargement with urinary retention, underwent left-sided prostate artery embolization 2 days ago.  Since then the patient has had worsening of hematuria, dysuria and suprapubic pain.  Increasingly he has had trouble to pass urine and has seen increasing amount of blood clots.  Denies any fever chills or back pain.  ED Course: Temperature 98.2, blood pressure 150/80 utilization 98% on room air.  CT abdomen pelvis showed markedly enlarged prostate.  Blood work showed WBC 10.4 hemoglobin 14.6, UA showed 3+ RBC 1+ WBC.  Urology came in and pulled the Foley and patient started to feel somewhat relieved of lower abdominal pain.  Review of Systems: As per HPI otherwise 14 point review of systems negative.    Past Medical History:  Diagnosis Date   Atrial flutter (HCC)    Atypical chest pain    Myoview performed 07/23/11 was completely normal. Post stress EF 61%.   Bruit    Left asymptomatic bruit. Carotid Duplex 12/13/12 =mildly abnormal. *BILATERAL BULB/PROXIMAL ICAs: Demonstrated a mild amount of fibrous plaque w/no evidence od significant diameter reduction, tortuosity or other vascular abnormality.   Enlarged prostate    BPH -  PSA of 6.7 this is consistant with his last PSA of 6.3   Hyperlipemia    Hypertension    Inguinal hernia    Inguinal hernia repair (x) 2 1991, 2011   Intestinal polyps 09/22/11   Colonoscopy removed a 2 mm sessile cecal polyp with a cold snare.   Measles    Mumps    Normal coronary arteries 2002   Peripheral neuropathy    Persistent atrial fibrillation (HCC)    Seasonal allergies    Skin cancer    Thrombocytopenia 2011   Very mild with platelets of 135,000.   Tinnitus    Umbilical hernia    Vitamin D  deficiency     Past Surgical History:  Procedure Laterality Date   ATRIAL FIBRILLATION ABLATION  09/20/2018   ATRIAL FIBRILLATION ABLATION N/A 09/20/2018   Procedure: ATRIAL FIBRILLATION ABLATION;  Surgeon: Kelsie Agent, MD;  Location: [facility] INVASIVE CV LAB;  Service: Cardiovascular;  Laterality: N/A;   CARDIAC CATHETERIZATION  2002   Normal cardiac catheterization   CARDIOVERSION  07/07/2012   A fib - Cardioversion successful.   CARDIOVERSION N/A 06/30/2013   Procedure: CARDIOVERSION;  Surgeon: Jerel Balding, MD;  Location: [facility] OR;  Service: Cardiovascular;  Laterality: N/A;   CARDIOVERSION N/A 08/02/2018   Procedure: CARDIOVERSION;  Surgeon: Pietro Redell RAMAN, MD;  Location: [facility] ENDOSCOPY;  Service: Cardiovascular;  Laterality: N/A;   Carotid Duplex  12/13/12   For asymptomatic bruit. Mildly abnormal.  *BILATERAL BULB/PROXIMAL ICAs: Demonstrated a mild amount of fibrous plaque w/no evidence of significant diameter reduction, tortuosity or other vascular abnormality.    COLONOSCOPY W/ POLYPECTOMY  09/22/11  2 mm sessile cecal polyp was removed with a cold snare.   HAND SURGERY     INGUINAL HERNIA REPAIR  1991 & 2011   IR 3D INDEPENDENT WKST  09/20/2024   IR ANGIOGRAM PELVIS SELECTIVE OR SUPRASELECTIVE  09/20/2024   IR ANGIOGRAM SELECTIVE EACH ADDITIONAL VESSEL  09/20/2024   IR ANGIOGRAM SELECTIVE EACH ADDITIONAL VESSEL  09/20/2024   IR ANGIOGRAM SELECTIVE EACH ADDITIONAL VESSEL   09/20/2024   IR EMBO TUMOR ORGAN ISCHEMIA INFARCT INC GUIDE ROADMAPPING  09/20/2024   IR US  GUIDE VASC ACCESS LEFT  09/20/2024   IR US  GUIDE VASC ACCESS RIGHT  09/20/2024   TEE WITHOUT CARDIOVERSION  07/07/2012   Procedure: TRANSESOPHAGEAL ECHOCARDIOGRAM (TEE);  Surgeon: Vinie KYM Maxcy, MD;  Location: [facility] ENDOSCOPY;  Service: Cardiovascular;  Laterality: N/A;   TEE WITHOUT CARDIOVERSION N/A 08/02/2018   Procedure: TRANSESOPHAGEAL ECHOCARDIOGRAM (TEE);  Surgeon: Pietro Redell RAMAN, MD;  Location: [facility] ENDOSCOPY;  Service: Cardiovascular;  Laterality: N/A;     reports that he quit smoking about 60 years ago. His smoking use included cigarettes. He has never used smokeless tobacco. He reports that he does not drink alcohol and does not use drugs.  Allergies  Allergen Reactions   Prednisone  Swelling, Other (See Comments) and Hypertension    Tachycardia   Trazodone  Hcl Other (See Comments)   Doxycycline Rash    Family History  Problem Relation Age of Onset   Heart failure Brother     Prior to Admission medications   Medication Sig Start Date End Date Taking? Authorizing Provider  Calcium  Citrate 250 MG TABS Take 1 tablet (250 mg total) by mouth daily with breakfast. 12/25/22   Fenton, Clint R, PA  Cholecalciferol  (VITAMIN D3) 2000 units TABS Take 2,000 Units by mouth daily.    [provider]  ciprofloxacin  (CIPRO ) 500 MG tablet Take 1 tablet (500 mg total) by mouth 2 (two) times daily. 09/20/24   Allred, Darrell K, PA-C  Cyanocobalamin  (B-12) 2500 MCG SUBL Place 1 tablet under the tongue daily.    [provider]  dofetilide  (TIKOSYN ) 250 MCG capsule Take 1 capsule (250 mcg total) by mouth 2 (two) times daily. 07/06/24   Lesia Ozell Barter, PA-C  doxazosin  (CARDURA ) 8 MG tablet Take 1 tablet (8 mg total) by mouth at bedtime. 10/04/13   Court Dorn PARAS, MD  ezetimibe  (ZETIA ) 10 MG tablet Take 10 mg by mouth daily.    [provider]  furosemide  (LASIX ) 20 MG  tablet Take 20 mg by mouth daily.    [provider]  lisinopril  (ZESTRIL ) 20 MG tablet Take 20 mg by mouth daily as needed (Blood pressure greater then 160).    [provider]  multivitamin (ONE-A-DAY MEN'S) TABS tablet Take 1 tablet by mouth daily. 12/25/22   Fenton, Clint R, PA  nitroGLYCERIN  (NITROSTAT ) 0.4 MG SL tablet PLACE 1 TABLET UNDER THE TONGUE EVERY 5 MINUTES AS NEEDED FOR CHEST PAIN 07/20/22   Allred, Lynwood, MD  oxyCODONE -acetaminophen  (PERCOCET) 5-325 MG tablet Take 1 tablet by mouth every 6 (six) hours as needed for severe pain (pain score 7-10). 09/20/24   Allred, Darrell K, PA-C  phenazopyridine  (PYRIDIUM ) 100 MG tablet Take 1 tablet (100 mg total) by mouth 3 (three) times daily as needed. 09/20/24   Allred, Darrell K, PA-C  polyethylene glycol (MIRALAX  / GLYCOLAX ) packet Take 17 g by mouth daily.    [provider]  rivaroxaban  (XARELTO ) 20 MG TABS tablet Take 1 tablet (20 mg total) by  mouth daily with supper. 12/30/22   Fenton, Clint R, PA  rOPINIRole  (REQUIP ) 0.5 MG tablet Take 0.5 mg by mouth at bedtime. 06/26/24   [provider]  solifenacin  (VESICARE ) 5 MG tablet Take 1 tablet (5 mg total) by mouth daily. 09/20/24   Allred, Darrell K, PA-C  vitamin C  (ASCORBIC ACID) 250 MG tablet Take 1 tablet (250 mg total) by mouth daily. 12/25/22   Nellene Bienenstock R, PA    Physical Exam: Vitals:   09/22/24 1100 09/22/24 1116 09/22/24 1130 09/22/24 1415  BP: (!) 178/85 (!) 178/85 (!) 148/114   Pulse: 86 86 (!) 106 96  Resp:  19    Temp:  98.2 F (36.8 C)    TempSrc:  Oral    SpO2: 98% 98% 99% 98%    Constitutional: NAD, calm, comfortable Vitals:   09/22/24 1100 09/22/24 1116 09/22/24 1130 09/22/24 1415  BP: (!) 178/85 (!) 178/85 (!) 148/114   Pulse: 86 86 (!) 106 96  Resp:  19    Temp:  98.2 F (36.8 C)    TempSrc:  Oral    SpO2: 98% 98% 99% 98%   Eyes: PERRL, lids and conjunctivae normal ENMT: Mucous membranes are moist. Posterior pharynx  clear of any exudate or lesions.Normal dentition.  Neck: normal, supple, no masses, no thyromegaly Respiratory: clear to auscultation bilaterally, no wheezing, no crackles. Normal respiratory effort. No accessory muscle use.  Cardiovascular: Regular rate and rhythm, no murmurs / rubs / gallops. No extremity edema. 2+ pedal pulses. No carotid bruits.  Abdomen: no tenderness, no masses palpated. No hepatosplenomegaly. Bowel sounds positive.  Musculoskeletal: no clubbing / cyanosis. No joint deformity upper and lower extremities. Good ROM, no contractures. Normal muscle tone.  Skin: no rashes, lesions, ulcers. No induration Neurologic: CN 2-12 grossly intact. Sensation intact, DTR normal. Strength 5/5 in all 4.  Psychiatric: Normal judgment and insight. Alert and oriented x 3. Normal mood.     Labs on Admission: I have personally reviewed following labs and imaging studies  CBC: Recent Labs  Lab 09/20/24 0945 09/22/24 0756  WBC 4.6 10.4  NEUTROABS 3.5 9.1*  HGB 15.1 14.3  HCT 44.0 42.1  MCV 92.6 94.2  PLT 125* 108*   Basic Metabolic Panel: Recent Labs  Lab 09/20/24 0945 09/22/24 0756  NA 138 132*  K 4.1 4.2  CL 102 99  CO2 27 24  GLUCOSE 111* 142*  BUN 14 15  CREATININE 0.92 1.22  CALCIUM  9.0 8.9   GFR: Estimated Creatinine Clearance: 46.8 mL/min (by C-G formula based on SCr of 1.22 mg/dL). Liver Function Tests: Recent Labs  Lab 09/20/24 0945 09/22/24 0756  AST 18 26  ALT 11 11  ALKPHOS 65 62  BILITOT 0.9 1.4*  PROT 7.2 6.9  ALBUMIN 4.1 4.0   No results for input(s): LIPASE, AMYLASE in the last 168 hours. No results for input(s): AMMONIA in the last 168 hours. Coagulation Profile: Recent Labs  Lab 09/20/24 0945  INR 1.2   Cardiac Enzymes: No results for input(s): CKTOTAL, CKMB, CKMBINDEX, TROPONINI in the last 168 hours. BNP (last 3 results) No results for input(s): PROBNP in the last 8760 hours. HbA1C: No results for input(s): HGBA1C  in the last 72 hours. CBG: No results for input(s): GLUCAP in the last 168 hours. Lipid Profile: No results for input(s): CHOL, HDL, LDLCALC, TRIG, CHOLHDL, LDLDIRECT in the last 72 hours. Thyroid  Function Tests: No results for input(s): TSH, T4TOTAL, FREET4, T3FREE, THYROIDAB in the last 72 hours.  Anemia Panel: No results for input(s): VITAMINB12, FOLATE, FERRITIN, TIBC, IRON, RETICCTPCT in the last 72 hours. Urine analysis:    Component Value Date/Time   COLORURINE AMBER (A) 09/22/2024 0827   APPEARANCEUR CLEAR 09/22/2024 0827   LABSPEC 1.010 09/22/2024 0827   PHURINE 6.0 09/22/2024 0827   GLUCOSEU NEGATIVE 09/22/2024 0827   HGBUR LARGE (A) 09/22/2024 0827   BILIRUBINUR NEGATIVE 09/22/2024 0827   KETONESUR NEGATIVE 09/22/2024 0827   PROTEINUR 30 (A) 09/22/2024 0827   UROBILINOGEN 1.0 03/22/2011 2029   NITRITE POSITIVE (A) 09/22/2024 0827   LEUKOCYTESUR NEGATIVE 09/22/2024 0827    Radiological Exams on Admission: CT HEMATURIA WORKUP Result Date: 09/22/2024 CLINICAL DATA:  Hematuria. History of benign prostatic hypertrophy status post left prostate artery embolization 09/20/2024 EXAM: CT ABDOMEN AND PELVIS WITHOUT AND WITH CONTRAST TECHNIQUE: Multidetector CT imaging of the abdomen and pelvis was performed following the standard protocol before and following the bolus administration of intravenous contrast. RADIATION DOSE REDUCTION: This exam was performed according to the departmental dose-optimization program which includes automated exposure control, adjustment of the mA and/or kV according to patient size and/or use of iterative reconstruction technique. CONTRAST:  OMNIPAQUE  IOHEXOL  300 MG/ML  SOLN COMPARISON:  CT abdomen dated 12/10/2022, CTA pelvis dated 08/10/2024 FINDINGS: Lower chest: Bibasilar dendriform ossification. No pleural effusion or pneumothorax demonstrated. Partially imaged multichamber cardiomegaly. Hepatobiliary: No focal  hepatic lesions. No intra or extrahepatic biliary ductal dilation. Cholelithiasis. Pancreas: No focal lesions or main ductal dilation. Spleen: Nonenhancing 2.7 cm splenic hypodensity (3:33), unchanged from 12/10/2022, likely benign. Mildly enlarged spleen measures 14.6 cm, previously 13.8 cm on 12/10/2022. Additional subcentimeter hypodensity, too small to characterize, but also likely cyst. Adrenals/Urinary Tract: No adrenal nodules. Duplex right kidney. No suspicious renal mass, calculi or hydronephrosis. 1.7 cm simple right parapelvic cyst (12:33). No specific follow-up imaging recommended. Segmental mild right ureteral narrowing, which may be related to peristalsis. No definite filling defects. Mildly trabeculated urinary bladder. Stomach/Bowel: Normal appearance of the stomach. No evidence of bowel wall thickening, distention, or inflammatory changes. Colonic diverticulosis without acute diverticulitis. Normal appendix. Vascular/Lymphatic: Aortic atherosclerosis. No enlarged abdominal or pelvic lymph nodes. Reproductive: Markedly enlarged prostate gland. 1.5 cm rounded hyperdensity in the left prostatic base (3:76), new compared to 08/10/2024, likely hemorrhage. Other: No free fluid, fluid collection, or free air. Musculoskeletal: No acute or abnormal lytic or blastic osseous lesions. Multilevel degenerative changes of the partially imaged thoracic and lumbar spine. Small fat-containing right inguinal hernia. Postsurgical changes of left anterior lower abdominal wall mesh hernia repair. Postsurgical changes of right inguinal region. IMPRESSION: 1. Markedly enlarged prostate gland with 1.5 cm rounded hyperdensity in the left prostatic base, new compared to 08/10/2024, likely hemorrhage in the setting of recent left prostate artery embolization. 2. No urolithiasis, filling defects, or urinary tract dilation. No suspicious renal or urothelial lesions. 3. Increased mild splenomegaly. 4.  Aortic Atherosclerosis  (ICD10-I70.0). Electronically Signed   By: Limin  Xu M.D.   On: 09/22/2024 12:28    EKG: Ordered  Assessment/Plan Principal Problem:   Urinary retention Active Problems:   Acute urinary retention  (please populate well all problems here in Problem List. (For example, if patient is on BP meds at home and you resume or decide to hold them, it is a problem that needs to be her. Same for CAD, COPD, HLD and so on)  Acute urinary retention Severe BPH status post left prostate artery embolization -Resolved after emergency Foley placement by urology. - Continue Foley, outpatient void trial. -  Continue doxazosin   Recurrent hematuria - Likely secondary to radiation damage from the IR procedure done the day before yesterday - H&H stable, recheck CBC tomorrow - Urology started patient on antibiotics after IR procedure, will switch to ceftriaxone  while patient in the hospital. - Continue as needed Pyridium  - Hold off Xarelto  today.  PAF - In sinus rhythm - Continue Tikosyn  - Hold off Xarelto   HTN Chronic HFpEF - Euvolemic, continue Lasix  - Continue lisinopril   DVT prophylaxis: SCD Code Status: Full code Family Communication: None at bedside Disposition Plan: Expect less than 2 midnight hospital stay Consults called: Urology Admission status: Telemetry observation   Cort ONEIDA Mana MD [facility] Pager [phone]  09/22/2024, 3:29 PM

## 2024-09-22 NOTE — Consult Note (Signed)
 Urology Consult Note   Requesting Attending Physician:  Stanley Charleston, MD Service Providing Consult: Urology  Consulting Attending: Dr. Sherrilee   Reason for Consult: Gross hematuria  HPI: Stanley Waters is seen in consultation for reasons noted above at the request of Stanley Charleston, MD. Patient is a 89 y.o. male presenting to [facility] emergency department with suprapubic pain, hematuria, dysuria, and gross blood via penis.  S/p prostatic artery embolization with IR on 09/20/2024.  Patient is known to our practice and followed by Stanley Waters where he has been followed for nocturia, frequency, and urgency.  Imaging and cystoscopy have revealed severe prostatic hyperplasia, around 150 mL with an enlarged obstructing median lobe and moderate bladder trabeculation.  On arrival patient was alert, oriented, and in no distress.  He reported severe pain during catheter placement prior to his PAE with bleeding afterwards, which she reports doing since.  He began taking his Xarelto  yesterday which has exacerbated his bleeding.  We were accompanied by interventional radiology and his son during patient exam.  ------------------  Assessment:   89 y.o. male with dysuria, suprapubic pain, and gross hematuria following prostatic artery embolization 2 days ago.  Followed by Stanley Waters   Recommendations: # Gross hematuria # Suprapubic pain # Dysuria  Patient expressed a desire to go home and we discussed the risk versus benefit of going home with adequate hydration and washing out Xarelto  versus placing Foley catheter to tamponade prostatic bleeding, remove any clots, or initiate CBI as necessary.  His son felt he should stay in hospital so the shared decision was made to proceed with Foley catheter placement.  A 36F three-way hematuria catheter was placed without difficulty and an immediate return of totally clear urine.  No blood products had been appreciated in the bladder on CT imaging.   Frank blood and clots were all likely to be prostatic.  I did not initiate CBI.  Catheter was placed under mild tension.  As needed Levsin  for spasm.  Would like to washout Xarelto  and refrain from restarting until at least 24 hours free of bleeding.  Risk stratification per cardiology  Hand irrigate as needed.  Urology will follow  Case and plan discussed with Stanley Waters  Past Medical History: Past Medical History:  Diagnosis Date   Atrial flutter (HCC)    Atypical chest pain    Myoview performed 07/23/11 was completely normal. Post stress EF 61%.   Bruit    Left asymptomatic bruit. Carotid Duplex 12/13/12 =mildly abnormal. *BILATERAL BULB/PROXIMAL ICAs: Demonstrated a mild amount of fibrous plaque w/no evidence od significant diameter reduction, tortuosity or other vascular abnormality.   Enlarged prostate    BPH - PSA of 6.7 this is consistant with his last PSA of 6.3   Hyperlipemia    Hypertension    Inguinal hernia    Inguinal hernia repair (x) 2 1991, 2011   Intestinal polyps 09/22/11   Colonoscopy removed a 2 mm sessile cecal polyp with a cold snare.   Measles    Mumps    Normal coronary arteries 2002   Peripheral neuropathy    Persistent atrial fibrillation (HCC)    Seasonal allergies    Skin cancer    Thrombocytopenia 2011   Very mild with platelets of 135,000.   Tinnitus    Umbilical hernia    Vitamin D  deficiency     Past Surgical History:  Past Surgical History:  Procedure Laterality Date   ATRIAL FIBRILLATION ABLATION  09/20/2018  ATRIAL FIBRILLATION ABLATION N/A 09/20/2018   Procedure: ATRIAL FIBRILLATION ABLATION;  Surgeon: Kelsie Agent, MD;  Location: [facility] INVASIVE CV LAB;  Service: Cardiovascular;  Laterality: N/A;   CARDIAC CATHETERIZATION  2002   Normal cardiac catheterization   CARDIOVERSION  07/07/2012   A fib - Cardioversion successful.   CARDIOVERSION N/A 06/30/2013   Procedure: CARDIOVERSION;  Surgeon: Jerel Balding, MD;  Location: [facility] OR;   Service: Cardiovascular;  Laterality: N/A;   CARDIOVERSION N/A 08/02/2018   Procedure: CARDIOVERSION;  Surgeon: Pietro Redell RAMAN, MD;  Location: [facility] ENDOSCOPY;  Service: Cardiovascular;  Laterality: N/A;   Carotid Duplex  12/13/12   For asymptomatic bruit. Mildly abnormal.  *BILATERAL BULB/PROXIMAL ICAs: Demonstrated a mild amount of fibrous plaque w/no evidence of significant diameter reduction, tortuosity or other vascular abnormality.    COLONOSCOPY W/ POLYPECTOMY  09/22/11   2 mm sessile cecal polyp was removed with a cold snare.   HAND SURGERY     INGUINAL HERNIA REPAIR  1991 & 2011   IR 3D INDEPENDENT WKST  09/20/2024   IR ANGIOGRAM PELVIS SELECTIVE OR SUPRASELECTIVE  09/20/2024   IR ANGIOGRAM SELECTIVE EACH ADDITIONAL VESSEL  09/20/2024   IR ANGIOGRAM SELECTIVE EACH ADDITIONAL VESSEL  09/20/2024   IR ANGIOGRAM SELECTIVE EACH ADDITIONAL VESSEL  09/20/2024   IR EMBO TUMOR ORGAN ISCHEMIA INFARCT INC GUIDE ROADMAPPING  09/20/2024   IR US  GUIDE VASC ACCESS LEFT  09/20/2024   IR US  GUIDE VASC ACCESS RIGHT  09/20/2024   TEE WITHOUT CARDIOVERSION  07/07/2012   Procedure: TRANSESOPHAGEAL ECHOCARDIOGRAM (TEE);  Surgeon: Vinie KYM Maxcy, MD;  Location: [facility] ENDOSCOPY;  Service: Cardiovascular;  Laterality: N/A;   TEE WITHOUT CARDIOVERSION N/A 08/02/2018   Procedure: TRANSESOPHAGEAL ECHOCARDIOGRAM (TEE);  Surgeon: Pietro Redell RAMAN, MD;  Location: [facility] ENDOSCOPY;  Service: Cardiovascular;  Laterality: N/A;    Medication: No current facility-administered medications for this encounter.   Current Outpatient Medications  Medication Sig Dispense Refill   Calcium  Citrate 250 MG TABS Take 1 tablet (250 mg total) by mouth daily with breakfast.  0   Cholecalciferol  (VITAMIN D3) 2000 units TABS Take 2,000 Units by mouth daily.     ciprofloxacin  (CIPRO ) 500 MG tablet Take 1 tablet (500 mg total) by mouth 2 (two) times daily. 14 tablet 0   Cyanocobalamin  (B-12) 2500 MCG SUBL Place 1 tablet under the tongue  daily.     dofetilide  (TIKOSYN ) 250 MCG capsule Take 1 capsule (250 mcg total) by mouth 2 (two) times daily. 180 capsule 1   doxazosin  (CARDURA ) 8 MG tablet Take 1 tablet (8 mg total) by mouth at bedtime. 90 tablet 3   ezetimibe  (ZETIA ) 10 MG tablet Take 10 mg by mouth daily.     furosemide  (LASIX ) 20 MG tablet Take 20 mg by mouth daily.     lisinopril  (ZESTRIL ) 20 MG tablet Take 20 mg by mouth daily as needed (Blood pressure greater then 160).     multivitamin (ONE-A-DAY MEN'S) TABS tablet Take 1 tablet by mouth daily.  0   nitroGLYCERIN  (NITROSTAT ) 0.4 MG SL tablet PLACE 1 TABLET UNDER THE TONGUE EVERY 5 MINUTES AS NEEDED FOR CHEST PAIN 25 tablet 0   oxyCODONE -acetaminophen  (PERCOCET) 5-325 MG tablet Take 1 tablet by mouth every 6 (six) hours as needed for severe pain (pain score 7-10). 20 tablet 0   phenazopyridine  (PYRIDIUM ) 100 MG tablet Take 1 tablet (100 mg total) by mouth 3 (three) times daily as needed. 21 tablet 0   polyethylene glycol (MIRALAX  / GLYCOLAX ) packet  Take 17 g by mouth daily.     rivaroxaban  (XARELTO ) 20 MG TABS tablet Take 1 tablet (20 mg total) by mouth daily with supper. 90 tablet 2   rOPINIRole  (REQUIP ) 0.5 MG tablet Take 0.5 mg by mouth at bedtime.     solifenacin  (VESICARE ) 5 MG tablet Take 1 tablet (5 mg total) by mouth daily. 7 tablet 0   vitamin C  (ASCORBIC ACID) 250 MG tablet Take 1 tablet (250 mg total) by mouth daily.      Allergies: Allergies  Allergen Reactions   Prednisone  Swelling, Other (See Comments) and Hypertension    Tachycardia   Trazodone  Hcl Other (See Comments)   Doxycycline Rash    Social History: Social History   Tobacco Use   Smoking status: Former    Current packs/day: 0.00    Types: Cigarettes    Quit date: 11/03/1963    Years since quitting: 60.9   Smokeless tobacco: Never   Tobacco comments:    Former smoker 12/21/22  Vaping Use   Vaping status: Never Used  Substance Use Topics   Alcohol use: No   Drug use: No    Family  History Family History  Problem Relation Age of Onset   Heart failure Brother     ROS   Objective   Vital signs in last 24 hours: BP (!) 148/114   Pulse (!) 106   Temp 98.2 F (36.8 C) (Oral)   Resp 19   SpO2 99%   Physical Exam General: A&O, resting, appropriate HEENT: [place]/AT Pulmonary: Normal work of breathing Cardiovascular: no cyanosis GU: Frequent urination.  Frank blood and clots per urethra   Most Recent Labs: Lab Results  Component Value Date   WBC 10.4 09/22/2024   HGB 14.3 09/22/2024   HCT 42.1 09/22/2024   PLT 108 (L) 09/22/2024    Lab Results  Component Value Date   NA 132 (L) 09/22/2024   K 4.2 09/22/2024   CL 99 09/22/2024   CO2 24 09/22/2024   BUN 15 09/22/2024   CREATININE 1.22 09/22/2024   CALCIUM  8.9 09/22/2024   MG 2.1 04/13/2024    Lab Results  Component Value Date   INR 1.2 09/20/2024     Urine Culture: @[id number] (laburin,org,[id number],[id number])@   IMAGING: CT HEMATURIA WORKUP Result Date: 09/22/2024 CLINICAL DATA:  Hematuria. History of benign prostatic hypertrophy status post left prostate artery embolization 09/20/2024 EXAM: CT ABDOMEN AND PELVIS WITHOUT AND WITH CONTRAST TECHNIQUE: Multidetector CT imaging of the abdomen and pelvis was performed following the standard protocol before and following the bolus administration of intravenous contrast. RADIATION DOSE REDUCTION: This exam was performed according to the departmental dose-optimization program which includes automated exposure control, adjustment of the mA and/or kV according to patient size and/or use of iterative reconstruction technique. CONTRAST:  OMNIPAQUE  IOHEXOL  300 MG/ML  SOLN COMPARISON:  CT abdomen dated 12/10/2022, CTA pelvis dated 08/10/2024 FINDINGS: Lower chest: Bibasilar dendriform ossification. No pleural effusion or pneumothorax demonstrated. Partially imaged multichamber cardiomegaly. Hepatobiliary: No focal hepatic lesions. No intra or extrahepatic biliary  ductal dilation. Cholelithiasis. Pancreas: No focal lesions or main ductal dilation. Spleen: Nonenhancing 2.7 cm splenic hypodensity (3:33), unchanged from 12/10/2022, likely benign. Mildly enlarged spleen measures 14.6 cm, previously 13.8 cm on 12/10/2022. Additional subcentimeter hypodensity, too small to characterize, but also likely cyst. Adrenals/Urinary Tract: No adrenal nodules. Duplex right kidney. No suspicious renal mass, calculi or hydronephrosis. 1.7 cm simple right parapelvic cyst (12:33). No specific follow-up imaging recommended. Segmental mild right ureteral narrowing,  which may be related to peristalsis. No definite filling defects. Mildly trabeculated urinary bladder. Stomach/Bowel: Normal appearance of the stomach. No evidence of bowel wall thickening, distention, or inflammatory changes. Colonic diverticulosis without acute diverticulitis. Normal appendix. Vascular/Lymphatic: Aortic atherosclerosis. No enlarged abdominal or pelvic lymph nodes. Reproductive: Markedly enlarged prostate gland. 1.5 cm rounded hyperdensity in the left prostatic base (3:76), new compared to 08/10/2024, likely hemorrhage. Other: No free fluid, fluid collection, or free air. Musculoskeletal: No acute or abnormal lytic or blastic osseous lesions. Multilevel degenerative changes of the partially imaged thoracic and lumbar spine. Small fat-containing right inguinal hernia. Postsurgical changes of left anterior lower abdominal wall mesh hernia repair. Postsurgical changes of right inguinal region. IMPRESSION: 1. Markedly enlarged prostate gland with 1.5 cm rounded hyperdensity in the left prostatic base, new compared to 08/10/2024, likely hemorrhage in the setting of recent left prostate artery embolization. 2. No urolithiasis, filling defects, or urinary tract dilation. No suspicious renal or urothelial lesions. 3. Increased mild splenomegaly. 4.  Aortic Atherosclerosis (ICD10-I70.0). Electronically Signed   By: Limin  Xu  M.D.   On: 09/22/2024 12:28   IR EMBO TUMOR ORGAN ISCHEMIA INFARCT INC GUIDE ROADMAPPING Result Date: 09/20/2024 CLINICAL DATA:  Benign prostatic hypertrophy with persistent lower urinary tract symptoms. See previous consultation. EXAM: EXAM LEFT PROSTATE ARTERY EMBOLIZATION TECHNIQUE: Informed consent was obtained from the patient following explanation of the procedure, risks, benefits and alternatives. The patient understands, agrees and consents for the procedure. All questions were addressed. A time out was performed prior to the initiation of the procedure. Maximal barrier sterile technique utilized including caps, mask, sterile gowns, sterile gloves, large sterile drape, hand hygiene, and chlorhexidine  prep. Intravenous Fentanyl  200mcg and Versed  4mg  were administered by RN during a total moderate (conscious) sedation time of 166 minutes; the patient's level of consciousness and physiological / cardiorespiratory status were monitored continuously by radiology RN under my direct supervision. A micropuncture set was used access the right common femoral artery under ultrasound. Bentson wire advanced into the infrarenal aorta. 5 French vascular sheath placed. Through this, a Sos catheter placed across the aortic bifurcation and Glidewire advanced to the left SFA. The Sos was exchanged for a 4 French straight glide catheter, advanced to the left SFA. Guidewire exchanged for stiff allied, then the glide catheter was exchanged for a long 35 cm 5 French sheath, advanced to the distal left common iliac artery. Through this, a C2 catheter was advanced into the proximal anterior division of the left internal iliac artery. Selective arteriography was obtained. The anterior tibial artery was selectively catheterized with a coaxial microcatheter and selective arteriography was performed. The left prostatic artery was catheterized with a microcatheter and selective arteriography and cone beam CT or performed, confirming  flow to the prostate without significant nontarget enhancement. 50 mcg nitroglycerin  were administered intra-arterially into the proximal left prostatic artery. Branches of the left prostatic artery were embolized with 100-300 micron embospheres to near cessation of flow. Follow-up arteriogram significant demonstrates significant pruning of branches to the prostate with significant stasis in the proximal left prostatic artery. The microcatheter and angiographic catheter removed. The long 5 French sheath was exchanged over the Bentson wire for a irregular 5 French she. The Sos catheter was again utilized to with the aid of a Bentson wire to catheterize the internal iliac artery for selective cone beam CT. There was into terminated localization of possible prostatic artery branch arising from the distal internal pudendal artery. Multiple attempts with a broad a of microcatheter  and guidewire combinations were attempted but this vessel segment cannot be adequately catheterized. No other conspicuous candidates for right prostatic artery were identified. The microcatheter and angiographic catheter were withdrawn. The sheath was removed, hemostasis achieved, and closure obtained with 5 French Celt device. The patient tolerated the procedure well was transferred to the recovery area in good condition. FLUOROSCOPY: Radiation Exposure Index (as provided by the fluoroscopic device): 3208 mGy air Kerma COMPLICATIONS: COMPLICATIONS none IMPRESSION: 1. Technically successful left prostate artery embolization. 2. Deferred right prostate artery embolization due indeterminate localization and failure to catheterize suspected origin. Electronically Signed   By: JONETTA Faes M.D.   On: 09/20/2024 17:00   IR US  Guide Vasc Access Right Result Date: 09/20/2024 CLINICAL DATA:  Benign prostatic hypertrophy with persistent lower urinary tract symptoms. See previous consultation. EXAM: EXAM LEFT PROSTATE ARTERY EMBOLIZATION TECHNIQUE:  Informed consent was obtained from the patient following explanation of the procedure, risks, benefits and alternatives. The patient understands, agrees and consents for the procedure. All questions were addressed. A time out was performed prior to the initiation of the procedure. Maximal barrier sterile technique utilized including caps, mask, sterile gowns, sterile gloves, large sterile drape, hand hygiene, and chlorhexidine  prep. Intravenous Fentanyl  200mcg and Versed  4mg  were administered by RN during a total moderate (conscious) sedation time of 166 minutes; the patient's level of consciousness and physiological / cardiorespiratory status were monitored continuously by radiology RN under my direct supervision. A micropuncture set was used access the right common femoral artery under ultrasound. Bentson wire advanced into the infrarenal aorta. 5 French vascular sheath placed. Through this, a Sos catheter placed across the aortic bifurcation and Glidewire advanced to the left SFA. The Sos was exchanged for a 4 French straight glide catheter, advanced to the left SFA. Guidewire exchanged for stiff allied, then the glide catheter was exchanged for a long 35 cm 5 French sheath, advanced to the distal left common iliac artery. Through this, a C2 catheter was advanced into the proximal anterior division of the left internal iliac artery. Selective arteriography was obtained. The anterior tibial artery was selectively catheterized with a coaxial microcatheter and selective arteriography was performed. The left prostatic artery was catheterized with a microcatheter and selective arteriography and cone beam CT or performed, confirming flow to the prostate without significant nontarget enhancement. 50 mcg nitroglycerin  were administered intra-arterially into the proximal left prostatic artery. Branches of the left prostatic artery were embolized with 100-300 micron embospheres to near cessation of flow. Follow-up  arteriogram significant demonstrates significant pruning of branches to the prostate with significant stasis in the proximal left prostatic artery. The microcatheter and angiographic catheter removed. The long 5 French sheath was exchanged over the Bentson wire for a irregular 5 French she. The Sos catheter was again utilized to with the aid of a Bentson wire to catheterize the internal iliac artery for selective cone beam CT. There was into terminated localization of possible prostatic artery branch arising from the distal internal pudendal artery. Multiple attempts with a broad a of microcatheter and guidewire combinations were attempted but this vessel segment cannot be adequately catheterized. No other conspicuous candidates for right prostatic artery were identified. The microcatheter and angiographic catheter were withdrawn. The sheath was removed, hemostasis achieved, and closure obtained with 5 French Celt device. The patient tolerated the procedure well was transferred to the recovery area in good condition. FLUOROSCOPY: Radiation Exposure Index (as provided by the fluoroscopic device): 3208 mGy air Kerma COMPLICATIONS: COMPLICATIONS none IMPRESSION: 1. Technically  successful left prostate artery embolization. 2. Deferred right prostate artery embolization due indeterminate localization and failure to catheterize suspected origin. Electronically Signed   By: JONETTA Faes M.D.   On: 09/20/2024 17:00   IR Angiogram Pelvis Selective Or Supraselective Result Date: 09/20/2024 CLINICAL DATA:  Benign prostatic hypertrophy with persistent lower urinary tract symptoms. See previous consultation. EXAM: EXAM LEFT PROSTATE ARTERY EMBOLIZATION TECHNIQUE: Informed consent was obtained from the patient following explanation of the procedure, risks, benefits and alternatives. The patient understands, agrees and consents for the procedure. All questions were addressed. A time out was performed prior to the initiation of  the procedure. Maximal barrier sterile technique utilized including caps, mask, sterile gowns, sterile gloves, large sterile drape, hand hygiene, and chlorhexidine  prep. Intravenous Fentanyl  200mcg and Versed  4mg  were administered by RN during a total moderate (conscious) sedation time of 166 minutes; the patient's level of consciousness and physiological / cardiorespiratory status were monitored continuously by radiology RN under my direct supervision. A micropuncture set was used access the right common femoral artery under ultrasound. Bentson wire advanced into the infrarenal aorta. 5 French vascular sheath placed. Through this, a Sos catheter placed across the aortic bifurcation and Glidewire advanced to the left SFA. The Sos was exchanged for a 4 French straight glide catheter, advanced to the left SFA. Guidewire exchanged for stiff allied, then the glide catheter was exchanged for a long 35 cm 5 French sheath, advanced to the distal left common iliac artery. Through this, a C2 catheter was advanced into the proximal anterior division of the left internal iliac artery. Selective arteriography was obtained. The anterior tibial artery was selectively catheterized with a coaxial microcatheter and selective arteriography was performed. The left prostatic artery was catheterized with a microcatheter and selective arteriography and cone beam CT or performed, confirming flow to the prostate without significant nontarget enhancement. 50 mcg nitroglycerin  were administered intra-arterially into the proximal left prostatic artery. Branches of the left prostatic artery were embolized with 100-300 micron embospheres to near cessation of flow. Follow-up arteriogram significant demonstrates significant pruning of branches to the prostate with significant stasis in the proximal left prostatic artery. The microcatheter and angiographic catheter removed. The long 5 French sheath was exchanged over the Bentson wire for a  irregular 5 French she. The Sos catheter was again utilized to with the aid of a Bentson wire to catheterize the internal iliac artery for selective cone beam CT. There was into terminated localization of possible prostatic artery branch arising from the distal internal pudendal artery. Multiple attempts with a broad a of microcatheter and guidewire combinations were attempted but this vessel segment cannot be adequately catheterized. No other conspicuous candidates for right prostatic artery were identified. The microcatheter and angiographic catheter were withdrawn. The sheath was removed, hemostasis achieved, and closure obtained with 5 French Celt device. The patient tolerated the procedure well was transferred to the recovery area in good condition. FLUOROSCOPY: Radiation Exposure Index (as provided by the fluoroscopic device): 3208 mGy air Kerma COMPLICATIONS: COMPLICATIONS none IMPRESSION: 1. Technically successful left prostate artery embolization. 2. Deferred right prostate artery embolization due indeterminate localization and failure to catheterize suspected origin. Electronically Signed   By: JONETTA Faes M.D.   On: 09/20/2024 17:00   IR Angiogram Selective Each Additional Vessel Result Date: 09/20/2024 CLINICAL DATA:  Benign prostatic hypertrophy with persistent lower urinary tract symptoms. See previous consultation. EXAM: EXAM LEFT PROSTATE ARTERY EMBOLIZATION TECHNIQUE: Informed consent was obtained from the patient following explanation of the procedure,  risks, benefits and alternatives. The patient understands, agrees and consents for the procedure. All questions were addressed. A time out was performed prior to the initiation of the procedure. Maximal barrier sterile technique utilized including caps, mask, sterile gowns, sterile gloves, large sterile drape, hand hygiene, and chlorhexidine  prep. Intravenous Fentanyl  200mcg and Versed  4mg  were administered by RN during a total moderate  (conscious) sedation time of 166 minutes; the patient's level of consciousness and physiological / cardiorespiratory status were monitored continuously by radiology RN under my direct supervision. A micropuncture set was used access the right common femoral artery under ultrasound. Bentson wire advanced into the infrarenal aorta. 5 French vascular sheath placed. Through this, a Sos catheter placed across the aortic bifurcation and Glidewire advanced to the left SFA. The Sos was exchanged for a 4 French straight glide catheter, advanced to the left SFA. Guidewire exchanged for stiff allied, then the glide catheter was exchanged for a long 35 cm 5 French sheath, advanced to the distal left common iliac artery. Through this, a C2 catheter was advanced into the proximal anterior division of the left internal iliac artery. Selective arteriography was obtained. The anterior tibial artery was selectively catheterized with a coaxial microcatheter and selective arteriography was performed. The left prostatic artery was catheterized with a microcatheter and selective arteriography and cone beam CT or performed, confirming flow to the prostate without significant nontarget enhancement. 50 mcg nitroglycerin  were administered intra-arterially into the proximal left prostatic artery. Branches of the left prostatic artery were embolized with 100-300 micron embospheres to near cessation of flow. Follow-up arteriogram significant demonstrates significant pruning of branches to the prostate with significant stasis in the proximal left prostatic artery. The microcatheter and angiographic catheter removed. The long 5 French sheath was exchanged over the Bentson wire for a irregular 5 French she. The Sos catheter was again utilized to with the aid of a Bentson wire to catheterize the internal iliac artery for selective cone beam CT. There was into terminated localization of possible prostatic artery branch arising from the distal  internal pudendal artery. Multiple attempts with a broad a of microcatheter and guidewire combinations were attempted but this vessel segment cannot be adequately catheterized. No other conspicuous candidates for right prostatic artery were identified. The microcatheter and angiographic catheter were withdrawn. The sheath was removed, hemostasis achieved, and closure obtained with 5 French Celt device. The patient tolerated the procedure well was transferred to the recovery area in good condition. FLUOROSCOPY: Radiation Exposure Index (as provided by the fluoroscopic device): 3208 mGy air Kerma COMPLICATIONS: COMPLICATIONS none IMPRESSION: 1. Technically successful left prostate artery embolization. 2. Deferred right prostate artery embolization due indeterminate localization and failure to catheterize suspected origin. Electronically Signed   By: JONETTA Faes M.D.   On: 09/20/2024 17:00   IR CT PELVIS W/CM Result Date: 09/20/2024 CLINICAL DATA:  Benign prostatic hypertrophy with persistent lower urinary tract symptoms. See previous consultation. EXAM: EXAM LEFT PROSTATE ARTERY EMBOLIZATION TECHNIQUE: Informed consent was obtained from the patient following explanation of the procedure, risks, benefits and alternatives. The patient understands, agrees and consents for the procedure. All questions were addressed. A time out was performed prior to the initiation of the procedure. Maximal barrier sterile technique utilized including caps, mask, sterile gowns, sterile gloves, large sterile drape, hand hygiene, and chlorhexidine  prep. Intravenous Fentanyl  200mcg and Versed  4mg  were administered by RN during a total moderate (conscious) sedation time of 166 minutes; the patient's level of consciousness and physiological / cardiorespiratory status were monitored  continuously by radiology RN under my direct supervision. A micropuncture set was used access the right common femoral artery under ultrasound. Bentson wire  advanced into the infrarenal aorta. 5 French vascular sheath placed. Through this, a Sos catheter placed across the aortic bifurcation and Glidewire advanced to the left SFA. The Sos was exchanged for a 4 French straight glide catheter, advanced to the left SFA. Guidewire exchanged for stiff allied, then the glide catheter was exchanged for a long 35 cm 5 French sheath, advanced to the distal left common iliac artery. Through this, a C2 catheter was advanced into the proximal anterior division of the left internal iliac artery. Selective arteriography was obtained. The anterior tibial artery was selectively catheterized with a coaxial microcatheter and selective arteriography was performed. The left prostatic artery was catheterized with a microcatheter and selective arteriography and cone beam CT or performed, confirming flow to the prostate without significant nontarget enhancement. 50 mcg nitroglycerin  were administered intra-arterially into the proximal left prostatic artery. Branches of the left prostatic artery were embolized with 100-300 micron embospheres to near cessation of flow. Follow-up arteriogram significant demonstrates significant pruning of branches to the prostate with significant stasis in the proximal left prostatic artery. The microcatheter and angiographic catheter removed. The long 5 French sheath was exchanged over the Bentson wire for a irregular 5 French she. The Sos catheter was again utilized to with the aid of a Bentson wire to catheterize the internal iliac artery for selective cone beam CT. There was into terminated localization of possible prostatic artery branch arising from the distal internal pudendal artery. Multiple attempts with a broad a of microcatheter and guidewire combinations were attempted but this vessel segment cannot be adequately catheterized. No other conspicuous candidates for right prostatic artery were identified. The microcatheter and angiographic catheter were  withdrawn. The sheath was removed, hemostasis achieved, and closure obtained with 5 French Celt device. The patient tolerated the procedure well was transferred to the recovery area in good condition. FLUOROSCOPY: Radiation Exposure Index (as provided by the fluoroscopic device): 3208 mGy air Kerma COMPLICATIONS: COMPLICATIONS none IMPRESSION: 1. Technically successful left prostate artery embolization. 2. Deferred right prostate artery embolization due indeterminate localization and failure to catheterize suspected origin. Electronically Signed   By: JONETTA Faes M.D.   On: 09/20/2024 17:00   IR 3D Independent [place] Result Date: 09/20/2024 CLINICAL DATA:  Benign prostatic hypertrophy with persistent lower urinary tract symptoms. See previous consultation. EXAM: EXAM LEFT PROSTATE ARTERY EMBOLIZATION TECHNIQUE: Informed consent was obtained from the patient following explanation of the procedure, risks, benefits and alternatives. The patient understands, agrees and consents for the procedure. All questions were addressed. A time out was performed prior to the initiation of the procedure. Maximal barrier sterile technique utilized including caps, mask, sterile gowns, sterile gloves, large sterile drape, hand hygiene, and chlorhexidine  prep. Intravenous Fentanyl  200mcg and Versed  4mg  were administered by RN during a total moderate (conscious) sedation time of 166 minutes; the patient's level of consciousness and physiological / cardiorespiratory status were monitored continuously by radiology RN under my direct supervision. A micropuncture set was used access the right common femoral artery under ultrasound. Bentson wire advanced into the infrarenal aorta. 5 French vascular sheath placed. Through this, a Sos catheter placed across the aortic bifurcation and Glidewire advanced to the left SFA. The Sos was exchanged for a 4 French straight glide catheter, advanced to the left SFA. Guidewire exchanged for stiff allied,  then the glide catheter was exchanged for a long  35 cm 5 French sheath, advanced to the distal left common iliac artery. Through this, a C2 catheter was advanced into the proximal anterior division of the left internal iliac artery. Selective arteriography was obtained. The anterior tibial artery was selectively catheterized with a coaxial microcatheter and selective arteriography was performed. The left prostatic artery was catheterized with a microcatheter and selective arteriography and cone beam CT or performed, confirming flow to the prostate without significant nontarget enhancement. 50 mcg nitroglycerin  were administered intra-arterially into the proximal left prostatic artery. Branches of the left prostatic artery were embolized with 100-300 micron embospheres to near cessation of flow. Follow-up arteriogram significant demonstrates significant pruning of branches to the prostate with significant stasis in the proximal left prostatic artery. The microcatheter and angiographic catheter removed. The long 5 French sheath was exchanged over the Bentson wire for a irregular 5 French she. The Sos catheter was again utilized to with the aid of a Bentson wire to catheterize the internal iliac artery for selective cone beam CT. There was into terminated localization of possible prostatic artery branch arising from the distal internal pudendal artery. Multiple attempts with a broad a of microcatheter and guidewire combinations were attempted but this vessel segment cannot be adequately catheterized. No other conspicuous candidates for right prostatic artery were identified. The microcatheter and angiographic catheter were withdrawn. The sheath was removed, hemostasis achieved, and closure obtained with 5 French Celt device. The patient tolerated the procedure well was transferred to the recovery area in good condition. FLUOROSCOPY: Radiation Exposure Index (as provided by the fluoroscopic device): 3208 mGy air Kerma  COMPLICATIONS: COMPLICATIONS none IMPRESSION: 1. Technically successful left prostate artery embolization. 2. Deferred right prostate artery embolization due indeterminate localization and failure to catheterize suspected origin. Electronically Signed   By: JONETTA Faes M.D.   On: 09/20/2024 17:00   IR Angiogram Selective Each Additional Vessel Result Date: 09/20/2024 CLINICAL DATA:  Benign prostatic hypertrophy with persistent lower urinary tract symptoms. See previous consultation. EXAM: EXAM LEFT PROSTATE ARTERY EMBOLIZATION TECHNIQUE: Informed consent was obtained from the patient following explanation of the procedure, risks, benefits and alternatives. The patient understands, agrees and consents for the procedure. All questions were addressed. A time out was performed prior to the initiation of the procedure. Maximal barrier sterile technique utilized including caps, mask, sterile gowns, sterile gloves, large sterile drape, hand hygiene, and chlorhexidine  prep. Intravenous Fentanyl  200mcg and Versed  4mg  were administered by RN during a total moderate (conscious) sedation time of 166 minutes; the patient's level of consciousness and physiological / cardiorespiratory status were monitored continuously by radiology RN under my direct supervision. A micropuncture set was used access the right common femoral artery under ultrasound. Bentson wire advanced into the infrarenal aorta. 5 French vascular sheath placed. Through this, a Sos catheter placed across the aortic bifurcation and Glidewire advanced to the left SFA. The Sos was exchanged for a 4 French straight glide catheter, advanced to the left SFA. Guidewire exchanged for stiff allied, then the glide catheter was exchanged for a long 35 cm 5 French sheath, advanced to the distal left common iliac artery. Through this, a C2 catheter was advanced into the proximal anterior division of the left internal iliac artery. Selective arteriography was obtained. The  anterior tibial artery was selectively catheterized with a coaxial microcatheter and selective arteriography was performed. The left prostatic artery was catheterized with a microcatheter and selective arteriography and cone beam CT or performed, confirming flow to the prostate without significant nontarget enhancement. 50  mcg nitroglycerin  were administered intra-arterially into the proximal left prostatic artery. Branches of the left prostatic artery were embolized with 100-300 micron embospheres to near cessation of flow. Follow-up arteriogram significant demonstrates significant pruning of branches to the prostate with significant stasis in the proximal left prostatic artery. The microcatheter and angiographic catheter removed. The long 5 French sheath was exchanged over the Bentson wire for a irregular 5 French she. The Sos catheter was again utilized to with the aid of a Bentson wire to catheterize the internal iliac artery for selective cone beam CT. There was into terminated localization of possible prostatic artery branch arising from the distal internal pudendal artery. Multiple attempts with a broad a of microcatheter and guidewire combinations were attempted but this vessel segment cannot be adequately catheterized. No other conspicuous candidates for right prostatic artery were identified. The microcatheter and angiographic catheter were withdrawn. The sheath was removed, hemostasis achieved, and closure obtained with 5 French Celt device. The patient tolerated the procedure well was transferred to the recovery area in good condition. FLUOROSCOPY: Radiation Exposure Index (as provided by the fluoroscopic device): 3208 mGy air Kerma COMPLICATIONS: COMPLICATIONS none IMPRESSION: 1. Technically successful left prostate artery embolization. 2. Deferred right prostate artery embolization due indeterminate localization and failure to catheterize suspected origin. Electronically Signed   By: JONETTA Faes M.D.    On: 09/20/2024 17:00   IR Angiogram Selective Each Additional Vessel Result Date: 09/20/2024 CLINICAL DATA:  Benign prostatic hypertrophy with persistent lower urinary tract symptoms. See previous consultation. EXAM: EXAM LEFT PROSTATE ARTERY EMBOLIZATION TECHNIQUE: Informed consent was obtained from the patient following explanation of the procedure, risks, benefits and alternatives. The patient understands, agrees and consents for the procedure. All questions were addressed. A time out was performed prior to the initiation of the procedure. Maximal barrier sterile technique utilized including caps, mask, sterile gowns, sterile gloves, large sterile drape, hand hygiene, and chlorhexidine  prep. Intravenous Fentanyl  200mcg and Versed  4mg  were administered by RN during a total moderate (conscious) sedation time of 166 minutes; the patient's level of consciousness and physiological / cardiorespiratory status were monitored continuously by radiology RN under my direct supervision. A micropuncture set was used access the right common femoral artery under ultrasound. Bentson wire advanced into the infrarenal aorta. 5 French vascular sheath placed. Through this, a Sos catheter placed across the aortic bifurcation and Glidewire advanced to the left SFA. The Sos was exchanged for a 4 French straight glide catheter, advanced to the left SFA. Guidewire exchanged for stiff allied, then the glide catheter was exchanged for a long 35 cm 5 French sheath, advanced to the distal left common iliac artery. Through this, a C2 catheter was advanced into the proximal anterior division of the left internal iliac artery. Selective arteriography was obtained. The anterior tibial artery was selectively catheterized with a coaxial microcatheter and selective arteriography was performed. The left prostatic artery was catheterized with a microcatheter and selective arteriography and cone beam CT or performed, confirming flow to the prostate  without significant nontarget enhancement. 50 mcg nitroglycerin  were administered intra-arterially into the proximal left prostatic artery. Branches of the left prostatic artery were embolized with 100-300 micron embospheres to near cessation of flow. Follow-up arteriogram significant demonstrates significant pruning of branches to the prostate with significant stasis in the proximal left prostatic artery. The microcatheter and angiographic catheter removed. The long 5 French sheath was exchanged over the Bentson wire for a irregular 5 French she. The Sos catheter was again utilized to with  the aid of a Bentson wire to catheterize the internal iliac artery for selective cone beam CT. There was into terminated localization of possible prostatic artery branch arising from the distal internal pudendal artery. Multiple attempts with a broad a of microcatheter and guidewire combinations were attempted but this vessel segment cannot be adequately catheterized. No other conspicuous candidates for right prostatic artery were identified. The microcatheter and angiographic catheter were withdrawn. The sheath was removed, hemostasis achieved, and closure obtained with 5 French Celt device. The patient tolerated the procedure well was transferred to the recovery area in good condition. FLUOROSCOPY: Radiation Exposure Index (as provided by the fluoroscopic device): 3208 mGy air Kerma COMPLICATIONS: COMPLICATIONS none IMPRESSION: 1. Technically successful left prostate artery embolization. 2. Deferred right prostate artery embolization due indeterminate localization and failure to catheterize suspected origin. Electronically Signed   By: JONETTA Faes M.D.   On: 09/20/2024 17:00    ------  Ole Bourdon, NP Pager: [phone]   Please contact the urology consult pager with any further questions/concerns.

## 2024-09-22 NOTE — Consult Note (Signed)
 Chief Complaint: Patient was seen in consultation today for gross hematuria  Referring Physician(s): Dr. Lamar Salen  Supervising Physician: Jenna Hacker  Patient Status: [facility]  History of Present Illness: Stanley [facility] is a 89 y.o. male with history of atrial flutter, a fib on Xarelto  at home presents to [name] with gross hematuria after recent prostate artery embolization.   Patient underwent PAE 11/19 by Dr. Johann at which time a foley catheter was placed for the procedure. During the procedure, the left prostate artery was localized and embolization, however the origin of the right could not be identified and no right-sided embolization was performed.  Since removal of the foley post procedure, patient states he has had pain and bleeding.   IR consulted for post-procedure evaluation.   CT Hematuria obtained today: 1. Markedly enlarged prostate gland with 1.5 cm rounded hyperdensity in the left prostatic base, new compared to 08/10/2024, likely hemorrhage in the setting of recent left prostate artery embolization. 2. No urolithiasis, filling defects, or urinary tract dilation. No suspicious renal or urothelial lesions. 3. Increased mild splenomegaly. 4.  Aortic Atherosclerosis (ICD10-I70.0).  APP to bedside.  Urology APP also in room. Patient states the bleeding was worse the second day which is also when he resumed his regular Xarelto  dosing.  He presents today with gross hematuria which is visibly dripping from penis at time of visit with containers of bloody urine at bedside.  UA obtained with + nitrites, however also on pyridium . Foley placed at bedside with immediate return or clear, yellow urine.   Past Medical History:  Diagnosis Date   Atrial flutter (HCC)    Atypical chest pain    Myoview performed 07/23/11 was completely normal. Post stress EF 61%.   Bruit    Left asymptomatic bruit. Carotid Duplex 12/13/12 =mildly abnormal. *BILATERAL BULB/PROXIMAL  ICAs: Demonstrated a mild amount of fibrous plaque w/no evidence od significant diameter reduction, tortuosity or other vascular abnormality.   Enlarged prostate    BPH - PSA of 6.7 this is consistant with his last PSA of 6.3   Hyperlipemia    Hypertension    Inguinal hernia    Inguinal hernia repair (x) 2 1991, 2011   Intestinal polyps 09/22/11   Colonoscopy removed a 2 mm sessile cecal polyp with a cold snare.   Measles    Mumps    Normal coronary arteries 2002   Peripheral neuropathy    Persistent atrial fibrillation (HCC)    Seasonal allergies    Skin cancer    Thrombocytopenia 2011   Very mild with platelets of 135,000.   Tinnitus    Umbilical hernia    Vitamin D  deficiency     Past Surgical History:  Procedure Laterality Date   ATRIAL FIBRILLATION ABLATION  09/20/2018   ATRIAL FIBRILLATION ABLATION N/A 09/20/2018   Procedure: ATRIAL FIBRILLATION ABLATION;  Surgeon: Kelsie Agent, MD;  Location: [facility];  Service: Cardiovascular;  Laterality: N/A;   CARDIAC CATHETERIZATION  2002   Normal cardiac catheterization   CARDIOVERSION  07/07/2012   A fib - Cardioversion successful.   CARDIOVERSION N/A 06/30/2013   Procedure: CARDIOVERSION;  Surgeon: Jerel Balding, MD;  Location: [facility] OR;  Service: Cardiovascular;  Laterality: N/A;   CARDIOVERSION N/A 08/02/2018   Procedure: CARDIOVERSION;  Surgeon: Pietro Redell RAMAN, MD;  Location: [facility] ENDOSCOPY;  Service: Cardiovascular;  Laterality: N/A;   Carotid Duplex  12/13/12   For asymptomatic bruit. Mildly abnormal.  *BILATERAL BULB/PROXIMAL ICAs: Demonstrated a  mild amount of fibrous plaque w/no evidence of significant diameter reduction, tortuosity or other vascular abnormality.    COLONOSCOPY W/ POLYPECTOMY  09/22/11   2 mm sessile cecal polyp was removed with a cold snare.   HAND SURGERY     INGUINAL HERNIA REPAIR  1991 & 2011   IR 3D INDEPENDENT WKST  09/20/2024   IR ANGIOGRAM PELVIS SELECTIVE OR SUPRASELECTIVE  09/20/2024    IR ANGIOGRAM SELECTIVE EACH ADDITIONAL VESSEL  09/20/2024   IR ANGIOGRAM SELECTIVE EACH ADDITIONAL VESSEL  09/20/2024   IR ANGIOGRAM SELECTIVE EACH ADDITIONAL VESSEL  09/20/2024   IR EMBO TUMOR ORGAN ISCHEMIA INFARCT INC GUIDE ROADMAPPING  09/20/2024   IR US  GUIDE VASC ACCESS LEFT  09/20/2024   IR US  GUIDE VASC ACCESS RIGHT  09/20/2024   TEE WITHOUT CARDIOVERSION  07/07/2012   Procedure: TRANSESOPHAGEAL ECHOCARDIOGRAM (TEE);  Surgeon: Vinie KYM Maxcy, MD;  Location: [facility] ENDOSCOPY;  Service: Cardiovascular;  Laterality: N/A;   TEE WITHOUT CARDIOVERSION N/A 08/02/2018   Procedure: TRANSESOPHAGEAL ECHOCARDIOGRAM (TEE);  Surgeon: Pietro Redell RAMAN, MD;  Location: [facility] ENDOSCOPY;  Service: Cardiovascular;  Laterality: N/A;    Allergies: Prednisone , Trazodone  hcl, and Doxycycline  Medications: Prior to Admission medications   Medication Sig Start Date End Date Taking? Authorizing Provider  Calcium  Citrate 250 MG TABS Take 1 tablet (250 mg total) by mouth daily with breakfast. 12/25/22   Fenton, Clint R, PA  Cholecalciferol  (VITAMIN D3) 2000 units TABS Take 2,000 Units by mouth daily.    [provider]  ciprofloxacin  (CIPRO ) 500 MG tablet Take 1 tablet (500 mg total) by mouth 2 (two) times daily. 09/20/24   Allred, Darrell K, PA-C  Cyanocobalamin  (B-12) 2500 MCG SUBL Place 1 tablet under the tongue daily.    [provider]  dofetilide  (TIKOSYN ) 250 MCG capsule Take 1 capsule (250 mcg total) by mouth 2 (two) times daily. 07/06/24   Lesia Ozell Barter, PA-C  doxazosin  (CARDURA ) 8 MG tablet Take 1 tablet (8 mg total) by mouth at bedtime. 10/04/13   Court Dorn PARAS, MD  ezetimibe  (ZETIA ) 10 MG tablet Take 10 mg by mouth daily.    [provider]  furosemide  (LASIX ) 20 MG tablet Take 20 mg by mouth daily.    [provider]  lisinopril  (ZESTRIL ) 20 MG tablet Take 20 mg by mouth daily as needed (Blood pressure greater then 160).    [provider]   multivitamin (ONE-A-DAY MEN'S) TABS tablet Take 1 tablet by mouth daily. 12/25/22   Fenton, Clint R, PA  nitroGLYCERIN  (NITROSTAT ) 0.4 MG SL tablet PLACE 1 TABLET UNDER THE TONGUE EVERY 5 MINUTES AS NEEDED FOR CHEST PAIN 07/20/22   Allred, Lynwood, MD  oxyCODONE -acetaminophen  (PERCOCET) 5-325 MG tablet Take 1 tablet by mouth every 6 (six) hours as needed for severe pain (pain score 7-10). 09/20/24   Allred, Darrell K, PA-C  phenazopyridine  (PYRIDIUM ) 100 MG tablet Take 1 tablet (100 mg total) by mouth 3 (three) times daily as needed. 09/20/24   Allred, Darrell K, PA-C  polyethylene glycol (MIRALAX  / GLYCOLAX ) packet Take 17 g by mouth daily.    [provider]  rivaroxaban  (XARELTO ) 20 MG TABS tablet Take 1 tablet (20 mg total) by mouth daily with supper. 12/30/22   Fenton, Clint R, PA  rOPINIRole  (REQUIP ) 0.5 MG tablet Take 0.5 mg by mouth at bedtime. 06/26/24   [provider]  solifenacin  (VESICARE ) 5 MG tablet Take 1 tablet (5 mg total) by mouth daily. 09/20/24   Allred,  Darrell K, PA-C  vitamin C  (ASCORBIC ACID) 250 MG tablet Take 1 tablet (250 mg total) by mouth daily. 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  Tobacco Use   Smoking status: Former    Current packs/day: 0.00    Types: Cigarettes    Quit date: 11/03/1963    Years since quitting: 60.9   Smokeless tobacco: Never   Tobacco comments:    Former smoker 12/21/22  Vaping Use   Vaping status: Never Used  Substance and Sexual Activity   Alcohol use: No   Drug use: No   Sexual activity: Not Currently  Other Topics Concern   Not on file  Social History Narrative   Lives in  [place], alone   Retired from human resources officer   Social Drivers of Health   Financial Resource Strain: Not on file  Food Insecurity: No Food Insecurity (12/23/2022)   Hunger Vital Sign    Worried About Running Out of Food in the Last Year: Never true    Ran Out of Food in the Last Year: Never true  Transportation Needs: No Transportation Needs (12/23/2022)   PRAPARE - Administrator, Civil Service (Medical): No    Lack of Transportation (Non-Medical): No  Physical Activity: Not on file  Stress: Not on file  Social Connections: Unknown (09/27/2022)   Received from [facility]   Social Network    Social Network: Not on file     Review of Systems: A 12 point ROS discussed and pertinent positives are indicated in the HPI above.  All other systems are negative.  Review of Systems  Constitutional:  Negative for fatigue and fever.  Respiratory:  Negative for cough and shortness of breath.   Cardiovascular:  Negative for chest pain.  Gastrointestinal:  Negative for abdominal pain, nausea and vomiting.  Genitourinary:  Positive for difficulty urinating, frequency, hematuria, penile pain and urgency.  Musculoskeletal:  Negative for back pain.  Psychiatric/Behavioral:  Negative for behavioral problems and confusion.     Vital Signs: BP (!) 148/114   Pulse 96   Temp 98.2 F (36.8 C) (Oral)   Resp 19   SpO2 98%   Physical Exam Vitals and nursing note reviewed.  Constitutional:      Appearance: Normal appearance. He is not ill-appearing.  HENT:     Mouth/Throat:     Mouth: Mucous membranes are moist.     Pharynx: Oropharynx is clear.  Cardiovascular:     Rate and Rhythm: Normal rate and regular rhythm.  Pulmonary:     Effort: Pulmonary effort is normal. No respiratory distress.     Breath sounds: Normal breath sounds.  Abdominal:     General: Abdomen is flat. There is no distension.     Palpations: Abdomen is soft.  Genitourinary:    Comments: Visible  bloody drainage at time of visit.  During assessment, foley was placed by Uro APP with immediate return of clear, yellow urine.  No further bloody drainage after placement.  Skin:    General: Skin is warm and  dry.     Comments: Procedure site intact.  Dressing removed at time of visit. No issues noted.   Neurological:     General: No focal deficit present.     Mental Status: He is alert and oriented to person, place, and time.  Psychiatric:        Mood and Affect: Mood normal.        Behavior: Behavior normal.        Thought Content: Thought content normal.        Judgment: Judgment normal.      Imaging: CT HEMATURIA WORKUP Result Date: 09/22/2024 CLINICAL DATA:  Hematuria. History of benign prostatic hypertrophy status post left prostate artery embolization 09/20/2024 EXAM: CT ABDOMEN AND PELVIS WITHOUT AND WITH CONTRAST TECHNIQUE: Multidetector CT imaging of the abdomen and pelvis was performed following the standard protocol before and following the bolus administration of intravenous contrast. RADIATION DOSE REDUCTION: This exam was performed according to the departmental dose-optimization program which includes automated exposure control, adjustment of the mA and/or kV according to patient size and/or use of iterative reconstruction technique. CONTRAST:  OMNIPAQUE  IOHEXOL  300 MG/ML  SOLN COMPARISON:  CT abdomen dated 12/10/2022, CTA pelvis dated 08/10/2024 FINDINGS: Lower chest: Bibasilar dendriform ossification. No pleural effusion or pneumothorax demonstrated. Partially imaged multichamber cardiomegaly. Hepatobiliary: No focal hepatic lesions. No intra or extrahepatic biliary ductal dilation. Cholelithiasis. Pancreas: No focal lesions or main ductal dilation. Spleen: Nonenhancing 2.7 cm splenic hypodensity (3:33), unchanged from 12/10/2022, likely benign. Mildly enlarged spleen measures 14.6 cm, previously 13.8 cm on 12/10/2022. Additional subcentimeter hypodensity, too small to  characterize, but also likely cyst. Adrenals/Urinary Tract: No adrenal nodules. Duplex right kidney. No suspicious renal mass, calculi or hydronephrosis. 1.7 cm simple right parapelvic cyst (12:33). No specific follow-up imaging recommended. Segmental mild right ureteral narrowing, which may be related to peristalsis. No definite filling defects. Mildly trabeculated urinary bladder. Stomach/Bowel: Normal appearance of the stomach. No evidence of bowel wall thickening, distention, or inflammatory changes. Colonic diverticulosis without acute diverticulitis. Normal appendix. Vascular/Lymphatic: Aortic atherosclerosis. No enlarged abdominal or pelvic lymph nodes. Reproductive: Markedly enlarged prostate gland. 1.5 cm rounded hyperdensity in the left prostatic base (3:76), new compared to 08/10/2024, likely hemorrhage. Other: No free fluid, fluid collection, or free air. Musculoskeletal: No acute or abnormal lytic or blastic osseous lesions. Multilevel degenerative changes of the partially imaged thoracic and lumbar spine. Small fat-containing right inguinal hernia. Postsurgical changes of left anterior lower abdominal wall mesh hernia repair. Postsurgical changes of right inguinal region. IMPRESSION: 1. Markedly enlarged prostate gland with 1.5 cm rounded hyperdensity in the left prostatic base, new compared to 08/10/2024, likely hemorrhage in the setting of recent left prostate artery embolization. 2. No urolithiasis, filling defects, or urinary tract dilation. No suspicious renal or urothelial lesions. 3. Increased mild splenomegaly. 4.  Aortic Atherosclerosis (ICD10-I70.0). Electronically Signed   By: Limin  Xu M.D.   On: 09/22/2024 12:28   IR EMBO TUMOR ORGAN ISCHEMIA INFARCT INC GUIDE ROADMAPPING Result Date: 09/20/2024 CLINICAL DATA:  Benign prostatic hypertrophy with persistent lower urinary tract symptoms. See previous consultation. EXAM: EXAM LEFT PROSTATE ARTERY EMBOLIZATION TECHNIQUE: Informed consent  was obtained from the patient following explanation of the procedure, risks, benefits and alternatives. The patient understands, agrees and consents for the procedure. All questions were addressed. A time out was performed prior to the initiation of the procedure. Maximal barrier sterile technique utilized including caps, mask, sterile gowns, sterile gloves, large sterile drape, hand hygiene, and chlorhexidine  prep. Intravenous Fentanyl   200mcg and Versed  4mg  were administered by RN during a total moderate (conscious) sedation time of 166 minutes; the patient's level of consciousness and physiological / cardiorespiratory status were monitored continuously by radiology RN under my direct supervision. A micropuncture set was used access the right common femoral artery under ultrasound. Bentson wire advanced into the infrarenal aorta. 5 French vascular sheath placed. Through this, a Sos catheter placed across the aortic bifurcation and Glidewire advanced to the left SFA. The Sos was exchanged for a 4 French straight glide catheter, advanced to the left SFA. Guidewire exchanged for stiff allied, then the glide catheter was exchanged for a long 35 cm 5 French sheath, advanced to the distal left common iliac artery. Through this, a C2 catheter was advanced into the proximal anterior division of the left internal iliac artery. Selective arteriography was obtained. The anterior tibial artery was selectively catheterized with a coaxial microcatheter and selective arteriography was performed. The left prostatic artery was catheterized with a microcatheter and selective arteriography and cone beam CT or performed, confirming flow to the prostate without significant nontarget enhancement. 50 mcg nitroglycerin  were administered intra-arterially into the proximal left prostatic artery. Branches of the left prostatic artery were embolized with 100-300 micron embospheres to near cessation of flow. Follow-up arteriogram significant  demonstrates significant pruning of branches to the prostate with significant stasis in the proximal left prostatic artery. The microcatheter and angiographic catheter removed. The long 5 French sheath was exchanged over the Bentson wire for a irregular 5 French she. The Sos catheter was again utilized to with the aid of a Bentson wire to catheterize the internal iliac artery for selective cone beam CT. There was into terminated localization of possible prostatic artery branch arising from the distal internal pudendal artery. Multiple attempts with a broad a of microcatheter and guidewire combinations were attempted but this vessel segment cannot be adequately catheterized. No other conspicuous candidates for right prostatic artery were identified. The microcatheter and angiographic catheter were withdrawn. The sheath was removed, hemostasis achieved, and closure obtained with 5 French Celt device. The patient tolerated the procedure well was transferred to the recovery area in good condition. FLUOROSCOPY: Radiation Exposure Index (as provided by the fluoroscopic device): 3208 mGy air Kerma COMPLICATIONS: COMPLICATIONS none IMPRESSION: 1. Technically successful left prostate artery embolization. 2. Deferred right prostate artery embolization due indeterminate localization and failure to catheterize suspected origin. Electronically Signed   By: JONETTA Faes M.D.   On: 09/20/2024 17:00   IR US  Guide Vasc Access Right Result Date: 09/20/2024 CLINICAL DATA:  Benign prostatic hypertrophy with persistent lower urinary tract symptoms. See previous consultation. EXAM: EXAM LEFT PROSTATE ARTERY EMBOLIZATION TECHNIQUE: Informed consent was obtained from the patient following explanation of the procedure, risks, benefits and alternatives. The patient understands, agrees and consents for the procedure. All questions were addressed. A time out was performed prior to the initiation of the procedure. Maximal barrier sterile  technique utilized including caps, mask, sterile gowns, sterile gloves, large sterile drape, hand hygiene, and chlorhexidine  prep. Intravenous Fentanyl  200mcg and Versed  4mg  were administered by RN during a total moderate (conscious) sedation time of 166 minutes; the patient's level of consciousness and physiological / cardiorespiratory status were monitored continuously by radiology RN under my direct supervision. A micropuncture set was used access the right common femoral artery under ultrasound. Bentson wire advanced into the infrarenal aorta. 5 French vascular sheath placed. Through this, a Sos catheter placed across the aortic bifurcation and Glidewire advanced to the left  SFA. The Sos was exchanged for a 4 French straight glide catheter, advanced to the left SFA. Guidewire exchanged for stiff allied, then the glide catheter was exchanged for a long 35 cm 5 French sheath, advanced to the distal left common iliac artery. Through this, a C2 catheter was advanced into the proximal anterior division of the left internal iliac artery. Selective arteriography was obtained. The anterior tibial artery was selectively catheterized with a coaxial microcatheter and selective arteriography was performed. The left prostatic artery was catheterized with a microcatheter and selective arteriography and cone beam CT or performed, confirming flow to the prostate without significant nontarget enhancement. 50 mcg nitroglycerin  were administered intra-arterially into the proximal left prostatic artery. Branches of the left prostatic artery were embolized with 100-300 micron embospheres to near cessation of flow. Follow-up arteriogram significant demonstrates significant pruning of branches to the prostate with significant stasis in the proximal left prostatic artery. The microcatheter and angiographic catheter removed. The long 5 French sheath was exchanged over the Bentson wire for a irregular 5 French she. The Sos catheter was  again utilized to with the aid of a Bentson wire to catheterize the internal iliac artery for selective cone beam CT. There was into terminated localization of possible prostatic artery branch arising from the distal internal pudendal artery. Multiple attempts with a broad a of microcatheter and guidewire combinations were attempted but this vessel segment cannot be adequately catheterized. No other conspicuous candidates for right prostatic artery were identified. The microcatheter and angiographic catheter were withdrawn. The sheath was removed, hemostasis achieved, and closure obtained with 5 French Celt device. The patient tolerated the procedure well was transferred to the recovery area in good condition. FLUOROSCOPY: Radiation Exposure Index (as provided by the fluoroscopic device): 3208 mGy air Kerma COMPLICATIONS: COMPLICATIONS none IMPRESSION: 1. Technically successful left prostate artery embolization. 2. Deferred right prostate artery embolization due indeterminate localization and failure to catheterize suspected origin. Electronically Signed   By: JONETTA Faes M.D.   On: 09/20/2024 17:00   IR Angiogram Pelvis Selective Or Supraselective Result Date: 09/20/2024 CLINICAL DATA:  Benign prostatic hypertrophy with persistent lower urinary tract symptoms. See previous consultation. EXAM: EXAM LEFT PROSTATE ARTERY EMBOLIZATION TECHNIQUE: Informed consent was obtained from the patient following explanation of the procedure, risks, benefits and alternatives. The patient understands, agrees and consents for the procedure. All questions were addressed. A time out was performed prior to the initiation of the procedure. Maximal barrier sterile technique utilized including caps, mask, sterile gowns, sterile gloves, large sterile drape, hand hygiene, and chlorhexidine  prep. Intravenous Fentanyl  200mcg and Versed  4mg  were administered by RN during a total moderate (conscious) sedation time of 166 minutes; the  patient's level of consciousness and physiological / cardiorespiratory status were monitored continuously by radiology RN under my direct supervision. A micropuncture set was used access the right common femoral artery under ultrasound. Bentson wire advanced into the infrarenal aorta. 5 French vascular sheath placed. Through this, a Sos catheter placed across the aortic bifurcation and Glidewire advanced to the left SFA. The Sos was exchanged for a 4 French straight glide catheter, advanced to the left SFA. Guidewire exchanged for stiff allied, then the glide catheter was exchanged for a long 35 cm 5 French sheath, advanced to the distal left common iliac artery. Through this, a C2 catheter was advanced into the proximal anterior division of the left internal iliac artery. Selective arteriography was obtained. The anterior tibial artery was selectively catheterized with a coaxial microcatheter and selective  arteriography was performed. The left prostatic artery was catheterized with a microcatheter and selective arteriography and cone beam CT or performed, confirming flow to the prostate without significant nontarget enhancement. 50 mcg nitroglycerin  were administered intra-arterially into the proximal left prostatic artery. Branches of the left prostatic artery were embolized with 100-300 micron embospheres to near cessation of flow. Follow-up arteriogram significant demonstrates significant pruning of branches to the prostate with significant stasis in the proximal left prostatic artery. The microcatheter and angiographic catheter removed. The long 5 French sheath was exchanged over the Bentson wire for a irregular 5 French she. The Sos catheter was again utilized to with the aid of a Bentson wire to catheterize the internal iliac artery for selective cone beam CT. There was into terminated localization of possible prostatic artery branch arising from the distal internal pudendal artery. Multiple attempts with a  broad a of microcatheter and guidewire combinations were attempted but this vessel segment cannot be adequately catheterized. No other conspicuous candidates for right prostatic artery were identified. The microcatheter and angiographic catheter were withdrawn. The sheath was removed, hemostasis achieved, and closure obtained with 5 French Celt device. The patient tolerated the procedure well was transferred to the recovery area in good condition. FLUOROSCOPY: Radiation Exposure Index (as provided by the fluoroscopic device): 3208 mGy air Kerma COMPLICATIONS: COMPLICATIONS none IMPRESSION: 1. Technically successful left prostate artery embolization. 2. Deferred right prostate artery embolization due indeterminate localization and failure to catheterize suspected origin. Electronically Signed   By: JONETTA Faes M.D.   On: 09/20/2024 17:00   IR Angiogram Selective Each Additional Vessel Result Date: 09/20/2024 CLINICAL DATA:  Benign prostatic hypertrophy with persistent lower urinary tract symptoms. See previous consultation. EXAM: EXAM LEFT PROSTATE ARTERY EMBOLIZATION TECHNIQUE: Informed consent was obtained from the patient following explanation of the procedure, risks, benefits and alternatives. The patient understands, agrees and consents for the procedure. All questions were addressed. A time out was performed prior to the initiation of the procedure. Maximal barrier sterile technique utilized including caps, mask, sterile gowns, sterile gloves, large sterile drape, hand hygiene, and chlorhexidine  prep. Intravenous Fentanyl  200mcg and Versed  4mg  were administered by RN during a total moderate (conscious) sedation time of 166 minutes; the patient's level of consciousness and physiological / cardiorespiratory status were monitored continuously by radiology RN under my direct supervision. A micropuncture set was used access the right common femoral artery under ultrasound. Bentson wire advanced into the  infrarenal aorta. 5 French vascular sheath placed. Through this, a Sos catheter placed across the aortic bifurcation and Glidewire advanced to the left SFA. The Sos was exchanged for a 4 French straight glide catheter, advanced to the left SFA. Guidewire exchanged for stiff allied, then the glide catheter was exchanged for a long 35 cm 5 French sheath, advanced to the distal left common iliac artery. Through this, a C2 catheter was advanced into the proximal anterior division of the left internal iliac artery. Selective arteriography was obtained. The anterior tibial artery was selectively catheterized with a coaxial microcatheter and selective arteriography was performed. The left prostatic artery was catheterized with a microcatheter and selective arteriography and cone beam CT or performed, confirming flow to the prostate without significant nontarget enhancement. 50 mcg nitroglycerin  were administered intra-arterially into the proximal left prostatic artery. Branches of the left prostatic artery were embolized with 100-300 micron embospheres to near cessation of flow. Follow-up arteriogram significant demonstrates significant pruning of branches to the prostate with significant stasis in the proximal left prostatic artery.  The microcatheter and angiographic catheter removed. The long 5 French sheath was exchanged over the Bentson wire for a irregular 5 French she. The Sos catheter was again utilized to with the aid of a Bentson wire to catheterize the internal iliac artery for selective cone beam CT. There was into terminated localization of possible prostatic artery branch arising from the distal internal pudendal artery. Multiple attempts with a broad a of microcatheter and guidewire combinations were attempted but this vessel segment cannot be adequately catheterized. No other conspicuous candidates for right prostatic artery were identified. The microcatheter and angiographic catheter were withdrawn. The  sheath was removed, hemostasis achieved, and closure obtained with 5 French Celt device. The patient tolerated the procedure well was transferred to the recovery area in good condition. FLUOROSCOPY: Radiation Exposure Index (as provided by the fluoroscopic device): 3208 mGy air Kerma COMPLICATIONS: COMPLICATIONS none IMPRESSION: 1. Technically successful left prostate artery embolization. 2. Deferred right prostate artery embolization due indeterminate localization and failure to catheterize suspected origin. Electronically Signed   By: JONETTA Faes M.D.   On: 09/20/2024 17:00   IR CT PELVIS W/CM Result Date: 09/20/2024 CLINICAL DATA:  Benign prostatic hypertrophy with persistent lower urinary tract symptoms. See previous consultation. EXAM: EXAM LEFT PROSTATE ARTERY EMBOLIZATION TECHNIQUE: Informed consent was obtained from the patient following explanation of the procedure, risks, benefits and alternatives. The patient understands, agrees and consents for the procedure. All questions were addressed. A time out was performed prior to the initiation of the procedure. Maximal barrier sterile technique utilized including caps, mask, sterile gowns, sterile gloves, large sterile drape, hand hygiene, and chlorhexidine  prep. Intravenous Fentanyl  200mcg and Versed  4mg  were administered by RN during a total moderate (conscious) sedation time of 166 minutes; the patient's level of consciousness and physiological / cardiorespiratory status were monitored continuously by radiology RN under my direct supervision. A micropuncture set was used access the right common femoral artery under ultrasound. Bentson wire advanced into the infrarenal aorta. 5 French vascular sheath placed. Through this, a Sos catheter placed across the aortic bifurcation and Glidewire advanced to the left SFA. The Sos was exchanged for a 4 French straight glide catheter, advanced to the left SFA. Guidewire exchanged for stiff allied, then the glide  catheter was exchanged for a long 35 cm 5 French sheath, advanced to the distal left common iliac artery. Through this, a C2 catheter was advanced into the proximal anterior division of the left internal iliac artery. Selective arteriography was obtained. The anterior tibial artery was selectively catheterized with a coaxial microcatheter and selective arteriography was performed. The left prostatic artery was catheterized with a microcatheter and selective arteriography and cone beam CT or performed, confirming flow to the prostate without significant nontarget enhancement. 50 mcg nitroglycerin  were administered intra-arterially into the proximal left prostatic artery. Branches of the left prostatic artery were embolized with 100-300 micron embospheres to near cessation of flow. Follow-up arteriogram significant demonstrates significant pruning of branches to the prostate with significant stasis in the proximal left prostatic artery. The microcatheter and angiographic catheter removed. The long 5 French sheath was exchanged over the Bentson wire for a irregular 5 French she. The Sos catheter was again utilized to with the aid of a Bentson wire to catheterize the internal iliac artery for selective cone beam CT. There was into terminated localization of possible prostatic artery branch arising from the distal internal pudendal artery. Multiple attempts with a broad a of microcatheter and guidewire combinations were attempted but this vessel  segment cannot be adequately catheterized. No other conspicuous candidates for right prostatic artery were identified. The microcatheter and angiographic catheter were withdrawn. The sheath was removed, hemostasis achieved, and closure obtained with 5 French Celt device. The patient tolerated the procedure well was transferred to the recovery area in good condition. FLUOROSCOPY: Radiation Exposure Index (as provided by the fluoroscopic device): 3208 mGy air Kerma COMPLICATIONS:  COMPLICATIONS none IMPRESSION: 1. Technically successful left prostate artery embolization. 2. Deferred right prostate artery embolization due indeterminate localization and failure to catheterize suspected origin. Electronically Signed   By: JONETTA Faes M.D.   On: 09/20/2024 17:00   IR 3D Independent [place] Result Date: 09/20/2024 CLINICAL DATA:  Benign prostatic hypertrophy with persistent lower urinary tract symptoms. See previous consultation. EXAM: EXAM LEFT PROSTATE ARTERY EMBOLIZATION TECHNIQUE: Informed consent was obtained from the patient following explanation of the procedure, risks, benefits and alternatives. The patient understands, agrees and consents for the procedure. All questions were addressed. A time out was performed prior to the initiation of the procedure. Maximal barrier sterile technique utilized including caps, mask, sterile gowns, sterile gloves, large sterile drape, hand hygiene, and chlorhexidine  prep. Intravenous Fentanyl  200mcg and Versed  4mg  were administered by RN during a total moderate (conscious) sedation time of 166 minutes; the patient's level of consciousness and physiological / cardiorespiratory status were monitored continuously by radiology RN under my direct supervision. A micropuncture set was used access the right common femoral artery under ultrasound. Bentson wire advanced into the infrarenal aorta. 5 French vascular sheath placed. Through this, a Sos catheter placed across the aortic bifurcation and Glidewire advanced to the left SFA. The Sos was exchanged for a 4 French straight glide catheter, advanced to the left SFA. Guidewire exchanged for stiff allied, then the glide catheter was exchanged for a long 35 cm 5 French sheath, advanced to the distal left common iliac artery. Through this, a C2 catheter was advanced into the proximal anterior division of the left internal iliac artery. Selective arteriography was obtained. The anterior tibial artery was selectively  catheterized with a coaxial microcatheter and selective arteriography was performed. The left prostatic artery was catheterized with a microcatheter and selective arteriography and cone beam CT or performed, confirming flow to the prostate without significant nontarget enhancement. 50 mcg nitroglycerin  were administered intra-arterially into the proximal left prostatic artery. Branches of the left prostatic artery were embolized with 100-300 micron embospheres to near cessation of flow. Follow-up arteriogram significant demonstrates significant pruning of branches to the prostate with significant stasis in the proximal left prostatic artery. The microcatheter and angiographic catheter removed. The long 5 French sheath was exchanged over the Bentson wire for a irregular 5 French she. The Sos catheter was again utilized to with the aid of a Bentson wire to catheterize the internal iliac artery for selective cone beam CT. There was into terminated localization of possible prostatic artery branch arising from the distal internal pudendal artery. Multiple attempts with a broad a of microcatheter and guidewire combinations were attempted but this vessel segment cannot be adequately catheterized. No other conspicuous candidates for right prostatic artery were identified. The microcatheter and angiographic catheter were withdrawn. The sheath was removed, hemostasis achieved, and closure obtained with 5 French Celt device. The patient tolerated the procedure well was transferred to the recovery area in good condition. FLUOROSCOPY: Radiation Exposure Index (as provided by the fluoroscopic device): 3208 mGy air Kerma COMPLICATIONS: COMPLICATIONS none IMPRESSION: 1. Technically successful left prostate artery embolization. 2. Deferred right prostate artery  embolization due indeterminate localization and failure to catheterize suspected origin. Electronically Signed   By: JONETTA Faes M.D.   On: 09/20/2024 17:00   IR Angiogram  Selective Each Additional Vessel Result Date: 09/20/2024 CLINICAL DATA:  Benign prostatic hypertrophy with persistent lower urinary tract symptoms. See previous consultation. EXAM: EXAM LEFT PROSTATE ARTERY EMBOLIZATION TECHNIQUE: Informed consent was obtained from the patient following explanation of the procedure, risks, benefits and alternatives. The patient understands, agrees and consents for the procedure. All questions were addressed. A time out was performed prior to the initiation of the procedure. Maximal barrier sterile technique utilized including caps, mask, sterile gowns, sterile gloves, large sterile drape, hand hygiene, and chlorhexidine  prep. Intravenous Fentanyl  200mcg and Versed  4mg  were administered by RN during a total moderate (conscious) sedation time of 166 minutes; the patient's level of consciousness and physiological / cardiorespiratory status were monitored continuously by radiology RN under my direct supervision. A micropuncture set was used access the right common femoral artery under ultrasound. Bentson wire advanced into the infrarenal aorta. 5 French vascular sheath placed. Through this, a Sos catheter placed across the aortic bifurcation and Glidewire advanced to the left SFA. The Sos was exchanged for a 4 French straight glide catheter, advanced to the left SFA. Guidewire exchanged for stiff allied, then the glide catheter was exchanged for a long 35 cm 5 French sheath, advanced to the distal left common iliac artery. Through this, a C2 catheter was advanced into the proximal anterior division of the left internal iliac artery. Selective arteriography was obtained. The anterior tibial artery was selectively catheterized with a coaxial microcatheter and selective arteriography was performed. The left prostatic artery was catheterized with a microcatheter and selective arteriography and cone beam CT or performed, confirming flow to the prostate without significant nontarget  enhancement. 50 mcg nitroglycerin  were administered intra-arterially into the proximal left prostatic artery. Branches of the left prostatic artery were embolized with 100-300 micron embospheres to near cessation of flow. Follow-up arteriogram significant demonstrates significant pruning of branches to the prostate with significant stasis in the proximal left prostatic artery. The microcatheter and angiographic catheter removed. The long 5 French sheath was exchanged over the Bentson wire for a irregular 5 French she. The Sos catheter was again utilized to with the aid of a Bentson wire to catheterize the internal iliac artery for selective cone beam CT. There was into terminated localization of possible prostatic artery branch arising from the distal internal pudendal artery. Multiple attempts with a broad a of microcatheter and guidewire combinations were attempted but this vessel segment cannot be adequately catheterized. No other conspicuous candidates for right prostatic artery were identified. The microcatheter and angiographic catheter were withdrawn. The sheath was removed, hemostasis achieved, and closure obtained with 5 French Celt device. The patient tolerated the procedure well was transferred to the recovery area in good condition. FLUOROSCOPY: Radiation Exposure Index (as provided by the fluoroscopic device): 3208 mGy air Kerma COMPLICATIONS: COMPLICATIONS none IMPRESSION: 1. Technically successful left prostate artery embolization. 2. Deferred right prostate artery embolization due indeterminate localization and failure to catheterize suspected origin. Electronically Signed   By: JONETTA Faes M.D.   On: 09/20/2024 17:00   IR Angiogram Selective Each Additional Vessel Result Date: 09/20/2024 CLINICAL DATA:  Benign prostatic hypertrophy with persistent lower urinary tract symptoms. See previous consultation. EXAM: EXAM LEFT PROSTATE ARTERY EMBOLIZATION TECHNIQUE: Informed consent was obtained from the  patient following explanation of the procedure, risks, benefits and alternatives. The patient understands, agrees and  consents for the procedure. All questions were addressed. A time out was performed prior to the initiation of the procedure. Maximal barrier sterile technique utilized including caps, mask, sterile gowns, sterile gloves, large sterile drape, hand hygiene, and chlorhexidine  prep. Intravenous Fentanyl  200mcg and Versed  4mg  were administered by RN during a total moderate (conscious) sedation time of 166 minutes; the patient's level of consciousness and physiological / cardiorespiratory status were monitored continuously by radiology RN under my direct supervision. A micropuncture set was used access the right common femoral artery under ultrasound. Bentson wire advanced into the infrarenal aorta. 5 French vascular sheath placed. Through this, a Sos catheter placed across the aortic bifurcation and Glidewire advanced to the left SFA. The Sos was exchanged for a 4 French straight glide catheter, advanced to the left SFA. Guidewire exchanged for stiff allied, then the glide catheter was exchanged for a long 35 cm 5 French sheath, advanced to the distal left common iliac artery. Through this, a C2 catheter was advanced into the proximal anterior division of the left internal iliac artery. Selective arteriography was obtained. The anterior tibial artery was selectively catheterized with a coaxial microcatheter and selective arteriography was performed. The left prostatic artery was catheterized with a microcatheter and selective arteriography and cone beam CT or performed, confirming flow to the prostate without significant nontarget enhancement. 50 mcg nitroglycerin  were administered intra-arterially into the proximal left prostatic artery. Branches of the left prostatic artery were embolized with 100-300 micron embospheres to near cessation of flow. Follow-up arteriogram significant demonstrates  significant pruning of branches to the prostate with significant stasis in the proximal left prostatic artery. The microcatheter and angiographic catheter removed. The long 5 French sheath was exchanged over the Bentson wire for a irregular 5 French she. The Sos catheter was again utilized to with the aid of a Bentson wire to catheterize the internal iliac artery for selective cone beam CT. There was into terminated localization of possible prostatic artery branch arising from the distal internal pudendal artery. Multiple attempts with a broad a of microcatheter and guidewire combinations were attempted but this vessel segment cannot be adequately catheterized. No other conspicuous candidates for right prostatic artery were identified. The microcatheter and angiographic catheter were withdrawn. The sheath was removed, hemostasis achieved, and closure obtained with 5 French Celt device. The patient tolerated the procedure well was transferred to the recovery area in good condition. FLUOROSCOPY: Radiation Exposure Index (as provided by the fluoroscopic device): 3208 mGy air Kerma COMPLICATIONS: COMPLICATIONS none IMPRESSION: 1. Technically successful left prostate artery embolization. 2. Deferred right prostate artery embolization due indeterminate localization and failure to catheterize suspected origin. Electronically Signed   By: JONETTA Faes M.D.   On: 09/20/2024 17:00    Labs:  CBC: Recent Labs    09/20/24 0945 09/22/24 0756  WBC 4.6 10.4  HGB 15.1 14.3  HCT 44.0 42.1  PLT 125* 108*    COAGS: Recent Labs    09/20/24 0945  INR 1.2    BMP: Recent Labs    04/13/24 0949 09/20/24 0945 09/22/24 0756  NA 138 138 132*  K 4.4 4.1 4.2  CL 100 102 99  CO2 23 27 24   GLUCOSE 110* 111* 142*  BUN 16 14 15   CALCIUM  9.1 9.0 8.9  CREATININE 1.04 0.92 1.22  GFRNONAA  --  >60 57*    LIVER FUNCTION TESTS: Recent Labs    08/28/24 1015 09/20/24 0945 09/22/24 0756  BILITOT  --  0.9 1.4*  AST   --  18 26  ALT  --  11 11  ALKPHOS  --  65 62  PROT 6.8 7.2 6.9  ALBUMIN  --  4.1 4.0    TUMOR MARKERS: No results for input(s): AFPTM, CEA, CA199, CHROMGRNA in the last 8760 hours.  Assessment and Plan: Hematuria after recent prostate artery embolization 09/20/24 Patient presents to [name] with worsening gross hematuria after recent successful embolization of the left prostate artery, right side unable to be cannulated.  He has taken his post-procedure meds as prescribed with significant post-procedure pain.  He notes progressively worsening pain and pelvic pressure particularly associated with urination with increased bloody drainage.  CT obtained by ED[facility] today is reviewed by Dr. Johann who notes stable enlarged prostate with residual contrast within the prostate from recent procedure, not hemorrhage. Hgb stable at 14.3.  Bedside placement of a foley catheter placed by Urology APP yields clear, yellow urine. Slightly orange hue likely from azo use at home.   Procedure site stable. Dressing removed.  No apparent complication related to prostate artery embolization.  Gross hematuria likely from enlarged prostate with concomitant anticoagulation use.   IR remains available.  No need for azo.  Anti-spasmotic per discretion of Uro in the setting of foley catheter.    Thank you for this interesting consult.  I greatly enjoyed meeting [facility] and look forward to participating in their care.  A copy of this report was sent to the requesting provider on this date.  Electronically Signed: Jonalyn Sedlak Sue-Ellen Cyann Venti, PA 09/22/2024, 3:08 PM   I spent a total of 40 Minutes    in face to face in clinical consultation, greater than 50% of which was counseling/coordinating care for hematuria.

## 2024-09-22 NOTE — ED Triage Notes (Signed)
 Patient c/o bladder pressure and hematuria. Patient states pain and blood clots comes out when he urinate. Post op bilateral prostate artery embolization 11/19, catheter removed same day.

## 2024-09-22 NOTE — ED Notes (Signed)
 Patient transported to CT

## 2024-09-22 NOTE — ED Provider Notes (Signed)
 [place] EMERGENCY DEPARTMENT AT [facility] Provider Note   CSN: [id number] Arrival date & time: 09/22/24  9355     Patient presents with: Hematuria   Stanley Waters is a 89 y.o. male.   HPI Adult male arrives with his son who assists with the history.  Patient is 2 days status post prostate artery embolization, now presenting with suprapubic pain, hematuria, dysuria, gross blood via penis. He notes that in the preprocedure Foley catheterization he felt pain, since that procedure, which included left prostate artery embolization, did not include right sided procedure due to vascular issues he has had worsening pain, now with blood while not urinating.  No systemic complaints including fevers, chest pain, dyspnea.    Prior to Admission medications   Medication Sig Start Date End Date Taking? Authorizing Provider  Calcium  Citrate 250 MG TABS Take 1 tablet (250 mg total) by mouth daily with breakfast. 12/25/22   Fenton, Clint R, PA  Cholecalciferol  (VITAMIN D3) 2000 units TABS Take 2,000 Units by mouth daily.    [provider]  ciprofloxacin  (CIPRO ) 500 MG tablet Take 1 tablet (500 mg total) by mouth 2 (two) times daily. 09/20/24   Allred, Darrell K, PA-C  Cyanocobalamin  (B-12) 2500 MCG SUBL Place 1 tablet under the tongue daily.    [provider]  dofetilide  (TIKOSYN ) 250 MCG capsule Take 1 capsule (250 mcg total) by mouth 2 (two) times daily. 07/06/24   Lesia Ozell Barter, PA-C  doxazosin  (CARDURA ) 8 MG tablet Take 1 tablet (8 mg total) by mouth at bedtime. 10/04/13   Court Dorn PARAS, MD  ezetimibe  (ZETIA ) 10 MG tablet Take 10 mg by mouth daily.    [provider]  furosemide  (LASIX ) 20 MG tablet Take 20 mg by mouth daily.    [provider]  lisinopril  (ZESTRIL ) 20 MG tablet Take 20 mg by mouth daily as needed (Blood pressure greater then 160).    [provider]  multivitamin (ONE-A-DAY MEN'S) TABS tablet Take 1 tablet  by mouth daily. 12/25/22   Fenton, Clint R, PA  nitroGLYCERIN  (NITROSTAT ) 0.4 MG SL tablet PLACE 1 TABLET UNDER THE TONGUE EVERY 5 MINUTES AS NEEDED FOR CHEST PAIN 07/20/22   Allred, Lynwood, MD  oxyCODONE -acetaminophen  (PERCOCET) 5-325 MG tablet Take 1 tablet by mouth every 6 (six) hours as needed for severe pain (pain score 7-10). 09/20/24   Allred, Darrell K, PA-C  phenazopyridine  (PYRIDIUM ) 100 MG tablet Take 1 tablet (100 mg total) by mouth 3 (three) times daily as needed. 09/20/24   Allred, Darrell K, PA-C  polyethylene glycol (MIRALAX  / GLYCOLAX ) packet Take 17 g by mouth daily.    [provider]  rivaroxaban  (XARELTO ) 20 MG TABS tablet Take 1 tablet (20 mg total) by mouth daily with supper. 12/30/22   Fenton, Clint R, PA  rOPINIRole  (REQUIP ) 0.5 MG tablet Take 0.5 mg by mouth at bedtime. 06/26/24   [provider]  solifenacin  (VESICARE ) 5 MG tablet Take 1 tablet (5 mg total) by mouth daily. 09/20/24   Allred, Darrell K, PA-C  vitamin C  (ASCORBIC ACID) 250 MG tablet Take 1 tablet (250 mg total) by mouth daily. 12/25/22   Fenton, Clint R, PA    Allergies: Prednisone , Trazodone  hcl, and Doxycycline    Review of Systems  Updated Vital Signs BP (!) 159/81 (BP Location: Right Arm)   Pulse 76   Temp 98.2 F (36.8 C) (Oral)   Resp 19   SpO2 97%   Physical  Exam Vitals and nursing note reviewed.  Constitutional:      General: He is not in acute distress.    Appearance: He is well-developed.  HENT:     Head: Normocephalic and atraumatic.  Eyes:     Conjunctiva/sclera: Conjunctivae normal.  Cardiovascular:     Rate and Rhythm: Normal rate and regular rhythm.     Pulses: Normal pulses.  Pulmonary:     Effort: Pulmonary effort is normal. No respiratory distress.     Breath sounds: No stridor.  Abdominal:     General: There is no distension.     Tenderness: There is no abdominal tenderness. There is no guarding.  Skin:    General: Skin is warm and dry.  Neurological:      Mental Status: He is alert and oriented to person, place, and time.     (all labs ordered are listed, but only abnormal results are displayed) Labs Reviewed  URINALYSIS, ROUTINE W REFLEX MICROSCOPIC - Abnormal; Notable for the following components:      Result Value   Color, Urine AMBER (*)    Hgb urine dipstick LARGE (*)    Protein, ur 30 (*)    Nitrite POSITIVE (*)    Bacteria, UA RARE (*)    All other components within normal limits  COMPREHENSIVE METABOLIC PANEL WITH GFR - Abnormal; Notable for the following components:   Sodium 132 (*)    Glucose, Bld 142 (*)    Total Bilirubin 1.4 (*)    GFR, Estimated 57 (*)    All other components within normal limits  CBC WITH DIFFERENTIAL/PLATELET - Abnormal; Notable for the following components:   Platelets 108 (*)    Neutro Abs 9.1 (*)    Lymphs Abs 0.4 (*)    All other components within normal limits    EKG: None  Radiology: IR EMBO TUMOR ORGAN ISCHEMIA INFARCT INC GUIDE ROADMAPPING Result Date: 09/20/2024 CLINICAL DATA:  Benign prostatic hypertrophy with persistent lower urinary tract symptoms. See previous consultation. EXAM: EXAM LEFT PROSTATE ARTERY EMBOLIZATION TECHNIQUE: Informed consent was obtained from the patient following explanation of the procedure, risks, benefits and alternatives. The patient understands, agrees and consents for the procedure. All questions were addressed. A time out was performed prior to the initiation of the procedure. Maximal barrier sterile technique utilized including caps, mask, sterile gowns, sterile gloves, large sterile drape, hand hygiene, and chlorhexidine  prep. Intravenous Fentanyl  200mcg and Versed  4mg  were administered by RN during a total moderate (conscious) sedation time of 166 minutes; the patient's level of consciousness and physiological / cardiorespiratory status were monitored continuously by radiology RN under my direct supervision. A micropuncture set was used access the right  common femoral artery under ultrasound. Bentson wire advanced into the infrarenal aorta. 5 French vascular sheath placed. Through this, a Sos catheter placed across the aortic bifurcation and Glidewire advanced to the left SFA. The Sos was exchanged for a 4 French straight glide catheter, advanced to the left SFA. Guidewire exchanged for stiff allied, then the glide catheter was exchanged for a long 35 cm 5 French sheath, advanced to the distal left common iliac artery. Through this, a C2 catheter was advanced into the proximal anterior division of the left internal iliac artery. Selective arteriography was obtained. The anterior tibial artery was selectively catheterized with a coaxial microcatheter and selective arteriography was performed. The left prostatic artery was catheterized with a microcatheter and selective arteriography and cone beam CT or performed, confirming flow to the prostate  without significant nontarget enhancement. 50 mcg nitroglycerin  were administered intra-arterially into the proximal left prostatic artery. Branches of the left prostatic artery were embolized with 100-300 micron embospheres to near cessation of flow. Follow-up arteriogram significant demonstrates significant pruning of branches to the prostate with significant stasis in the proximal left prostatic artery. The microcatheter and angiographic catheter removed. The long 5 French sheath was exchanged over the Bentson wire for a irregular 5 French she. The Sos catheter was again utilized to with the aid of a Bentson wire to catheterize the internal iliac artery for selective cone beam CT. There was into terminated localization of possible prostatic artery branch arising from the distal internal pudendal artery. Multiple attempts with a broad a of microcatheter and guidewire combinations were attempted but this vessel segment cannot be adequately catheterized. No other conspicuous candidates for right prostatic artery were  identified. The microcatheter and angiographic catheter were withdrawn. The sheath was removed, hemostasis achieved, and closure obtained with 5 French Celt device. The patient tolerated the procedure well was transferred to the recovery area in good condition. FLUOROSCOPY: Radiation Exposure Index (as provided by the fluoroscopic device): 3208 mGy air Kerma COMPLICATIONS: COMPLICATIONS none IMPRESSION: 1. Technically successful left prostate artery embolization. 2. Deferred right prostate artery embolization due indeterminate localization and failure to catheterize suspected origin. Electronically Signed   By: JONETTA Faes M.D.   On: 09/20/2024 17:00   IR US  Guide Vasc Access Right Result Date: 09/20/2024 CLINICAL DATA:  Benign prostatic hypertrophy with persistent lower urinary tract symptoms. See previous consultation. EXAM: EXAM LEFT PROSTATE ARTERY EMBOLIZATION TECHNIQUE: Informed consent was obtained from the patient following explanation of the procedure, risks, benefits and alternatives. The patient understands, agrees and consents for the procedure. All questions were addressed. A time out was performed prior to the initiation of the procedure. Maximal barrier sterile technique utilized including caps, mask, sterile gowns, sterile gloves, large sterile drape, hand hygiene, and chlorhexidine  prep. Intravenous Fentanyl  200mcg and Versed  4mg  were administered by RN during a total moderate (conscious) sedation time of 166 minutes; the patient's level of consciousness and physiological / cardiorespiratory status were monitored continuously by radiology RN under my direct supervision. A micropuncture set was used access the right common femoral artery under ultrasound. Bentson wire advanced into the infrarenal aorta. 5 French vascular sheath placed. Through this, a Sos catheter placed across the aortic bifurcation and Glidewire advanced to the left SFA. The Sos was exchanged for a 4 French straight glide  catheter, advanced to the left SFA. Guidewire exchanged for stiff allied, then the glide catheter was exchanged for a long 35 cm 5 French sheath, advanced to the distal left common iliac artery. Through this, a C2 catheter was advanced into the proximal anterior division of the left internal iliac artery. Selective arteriography was obtained. The anterior tibial artery was selectively catheterized with a coaxial microcatheter and selective arteriography was performed. The left prostatic artery was catheterized with a microcatheter and selective arteriography and cone beam CT or performed, confirming flow to the prostate without significant nontarget enhancement. 50 mcg nitroglycerin  were administered intra-arterially into the proximal left prostatic artery. Branches of the left prostatic artery were embolized with 100-300 micron embospheres to near cessation of flow. Follow-up arteriogram significant demonstrates significant pruning of branches to the prostate with significant stasis in the proximal left prostatic artery. The microcatheter and angiographic catheter removed. The long 5 French sheath was exchanged over the Bentson wire for a irregular 5 French she. The Sos catheter  was again utilized to with the aid of a Bentson wire to catheterize the internal iliac artery for selective cone beam CT. There was into terminated localization of possible prostatic artery branch arising from the distal internal pudendal artery. Multiple attempts with a broad a of microcatheter and guidewire combinations were attempted but this vessel segment cannot be adequately catheterized. No other conspicuous candidates for right prostatic artery were identified. The microcatheter and angiographic catheter were withdrawn. The sheath was removed, hemostasis achieved, and closure obtained with 5 French Celt device. The patient tolerated the procedure well was transferred to the recovery area in good condition. FLUOROSCOPY: Radiation  Exposure Index (as provided by the fluoroscopic device): 3208 mGy air Kerma COMPLICATIONS: COMPLICATIONS none IMPRESSION: 1. Technically successful left prostate artery embolization. 2. Deferred right prostate artery embolization due indeterminate localization and failure to catheterize suspected origin. Electronically Signed   By: JONETTA Faes M.D.   On: 09/20/2024 17:00   IR Angiogram Pelvis Selective Or Supraselective Result Date: 09/20/2024 CLINICAL DATA:  Benign prostatic hypertrophy with persistent lower urinary tract symptoms. See previous consultation. EXAM: EXAM LEFT PROSTATE ARTERY EMBOLIZATION TECHNIQUE: Informed consent was obtained from the patient following explanation of the procedure, risks, benefits and alternatives. The patient understands, agrees and consents for the procedure. All questions were addressed. A time out was performed prior to the initiation of the procedure. Maximal barrier sterile technique utilized including caps, mask, sterile gowns, sterile gloves, large sterile drape, hand hygiene, and chlorhexidine  prep. Intravenous Fentanyl  200mcg and Versed  4mg  were administered by RN during a total moderate (conscious) sedation time of 166 minutes; the patient's level of consciousness and physiological / cardiorespiratory status were monitored continuously by radiology RN under my direct supervision. A micropuncture set was used access the right common femoral artery under ultrasound. Bentson wire advanced into the infrarenal aorta. 5 French vascular sheath placed. Through this, a Sos catheter placed across the aortic bifurcation and Glidewire advanced to the left SFA. The Sos was exchanged for a 4 French straight glide catheter, advanced to the left SFA. Guidewire exchanged for stiff allied, then the glide catheter was exchanged for a long 35 cm 5 French sheath, advanced to the distal left common iliac artery. Through this, a C2 catheter was advanced into the proximal anterior division  of the left internal iliac artery. Selective arteriography was obtained. The anterior tibial artery was selectively catheterized with a coaxial microcatheter and selective arteriography was performed. The left prostatic artery was catheterized with a microcatheter and selective arteriography and cone beam CT or performed, confirming flow to the prostate without significant nontarget enhancement. 50 mcg nitroglycerin  were administered intra-arterially into the proximal left prostatic artery. Branches of the left prostatic artery were embolized with 100-300 micron embospheres to near cessation of flow. Follow-up arteriogram significant demonstrates significant pruning of branches to the prostate with significant stasis in the proximal left prostatic artery. The microcatheter and angiographic catheter removed. The long 5 French sheath was exchanged over the Bentson wire for a irregular 5 French she. The Sos catheter was again utilized to with the aid of a Bentson wire to catheterize the internal iliac artery for selective cone beam CT. There was into terminated localization of possible prostatic artery branch arising from the distal internal pudendal artery. Multiple attempts with a broad a of microcatheter and guidewire combinations were attempted but this vessel segment cannot be adequately catheterized. No other conspicuous candidates for right prostatic artery were identified. The microcatheter and angiographic catheter were withdrawn. The sheath  was removed, hemostasis achieved, and closure obtained with 5 French Celt device. The patient tolerated the procedure well was transferred to the recovery area in good condition. FLUOROSCOPY: Radiation Exposure Index (as provided by the fluoroscopic device): 3208 mGy air Kerma COMPLICATIONS: COMPLICATIONS none IMPRESSION: 1. Technically successful left prostate artery embolization. 2. Deferred right prostate artery embolization due indeterminate localization and failure to  catheterize suspected origin. Electronically Signed   By: JONETTA Faes M.D.   On: 09/20/2024 17:00   IR Angiogram Selective Each Additional Vessel Result Date: 09/20/2024 CLINICAL DATA:  Benign prostatic hypertrophy with persistent lower urinary tract symptoms. See previous consultation. EXAM: EXAM LEFT PROSTATE ARTERY EMBOLIZATION TECHNIQUE: Informed consent was obtained from the patient following explanation of the procedure, risks, benefits and alternatives. The patient understands, agrees and consents for the procedure. All questions were addressed. A time out was performed prior to the initiation of the procedure. Maximal barrier sterile technique utilized including caps, mask, sterile gowns, sterile gloves, large sterile drape, hand hygiene, and chlorhexidine  prep. Intravenous Fentanyl  200mcg and Versed  4mg  were administered by RN during a total moderate (conscious) sedation time of 166 minutes; the patient's level of consciousness and physiological / cardiorespiratory status were monitored continuously by radiology RN under my direct supervision. A micropuncture set was used access the right common femoral artery under ultrasound. Bentson wire advanced into the infrarenal aorta. 5 French vascular sheath placed. Through this, a Sos catheter placed across the aortic bifurcation and Glidewire advanced to the left SFA. The Sos was exchanged for a 4 French straight glide catheter, advanced to the left SFA. Guidewire exchanged for stiff allied, then the glide catheter was exchanged for a long 35 cm 5 French sheath, advanced to the distal left common iliac artery. Through this, a C2 catheter was advanced into the proximal anterior division of the left internal iliac artery. Selective arteriography was obtained. The anterior tibial artery was selectively catheterized with a coaxial microcatheter and selective arteriography was performed. The left prostatic artery was catheterized with a microcatheter and selective  arteriography and cone beam CT or performed, confirming flow to the prostate without significant nontarget enhancement. 50 mcg nitroglycerin  were administered intra-arterially into the proximal left prostatic artery. Branches of the left prostatic artery were embolized with 100-300 micron embospheres to near cessation of flow. Follow-up arteriogram significant demonstrates significant pruning of branches to the prostate with significant stasis in the proximal left prostatic artery. The microcatheter and angiographic catheter removed. The long 5 French sheath was exchanged over the Bentson wire for a irregular 5 French she. The Sos catheter was again utilized to with the aid of a Bentson wire to catheterize the internal iliac artery for selective cone beam CT. There was into terminated localization of possible prostatic artery branch arising from the distal internal pudendal artery. Multiple attempts with a broad a of microcatheter and guidewire combinations were attempted but this vessel segment cannot be adequately catheterized. No other conspicuous candidates for right prostatic artery were identified. The microcatheter and angiographic catheter were withdrawn. The sheath was removed, hemostasis achieved, and closure obtained with 5 French Celt device. The patient tolerated the procedure well was transferred to the recovery area in good condition. FLUOROSCOPY: Radiation Exposure Index (as provided by the fluoroscopic device): 3208 mGy air Kerma COMPLICATIONS: COMPLICATIONS none IMPRESSION: 1. Technically successful left prostate artery embolization. 2. Deferred right prostate artery embolization due indeterminate localization and failure to catheterize suspected origin. Electronically Signed   By: JONETTA Faes M.D.   On:  09/20/2024 17:00   IR CT PELVIS W/CM Result Date: 09/20/2024 CLINICAL DATA:  Benign prostatic hypertrophy with persistent lower urinary tract symptoms. See previous consultation. EXAM: EXAM LEFT  PROSTATE ARTERY EMBOLIZATION TECHNIQUE: Informed consent was obtained from the patient following explanation of the procedure, risks, benefits and alternatives. The patient understands, agrees and consents for the procedure. All questions were addressed. A time out was performed prior to the initiation of the procedure. Maximal barrier sterile technique utilized including caps, mask, sterile gowns, sterile gloves, large sterile drape, hand hygiene, and chlorhexidine  prep. Intravenous Fentanyl  200mcg and Versed  4mg  were administered by RN during a total moderate (conscious) sedation time of 166 minutes; the patient's level of consciousness and physiological / cardiorespiratory status were monitored continuously by radiology RN under my direct supervision. A micropuncture set was used access the right common femoral artery under ultrasound. Bentson wire advanced into the infrarenal aorta. 5 French vascular sheath placed. Through this, a Sos catheter placed across the aortic bifurcation and Glidewire advanced to the left SFA. The Sos was exchanged for a 4 French straight glide catheter, advanced to the left SFA. Guidewire exchanged for stiff allied, then the glide catheter was exchanged for a long 35 cm 5 French sheath, advanced to the distal left common iliac artery. Through this, a C2 catheter was advanced into the proximal anterior division of the left internal iliac artery. Selective arteriography was obtained. The anterior tibial artery was selectively catheterized with a coaxial microcatheter and selective arteriography was performed. The left prostatic artery was catheterized with a microcatheter and selective arteriography and cone beam CT or performed, confirming flow to the prostate without significant nontarget enhancement. 50 mcg nitroglycerin  were administered intra-arterially into the proximal left prostatic artery. Branches of the left prostatic artery were embolized with 100-300 micron embospheres to  near cessation of flow. Follow-up arteriogram significant demonstrates significant pruning of branches to the prostate with significant stasis in the proximal left prostatic artery. The microcatheter and angiographic catheter removed. The long 5 French sheath was exchanged over the Bentson wire for a irregular 5 French she. The Sos catheter was again utilized to with the aid of a Bentson wire to catheterize the internal iliac artery for selective cone beam CT. There was into terminated localization of possible prostatic artery branch arising from the distal internal pudendal artery. Multiple attempts with a broad a of microcatheter and guidewire combinations were attempted but this vessel segment cannot be adequately catheterized. No other conspicuous candidates for right prostatic artery were identified. The microcatheter and angiographic catheter were withdrawn. The sheath was removed, hemostasis achieved, and closure obtained with 5 French Celt device. The patient tolerated the procedure well was transferred to the recovery area in good condition. FLUOROSCOPY: Radiation Exposure Index (as provided by the fluoroscopic device): 3208 mGy air Kerma COMPLICATIONS: COMPLICATIONS none IMPRESSION: 1. Technically successful left prostate artery embolization. 2. Deferred right prostate artery embolization due indeterminate localization and failure to catheterize suspected origin. Electronically Signed   By: JONETTA Faes M.D.   On: 09/20/2024 17:00   IR 3D Independent [place] Result Date: 09/20/2024 CLINICAL DATA:  Benign prostatic hypertrophy with persistent lower urinary tract symptoms. See previous consultation. EXAM: EXAM LEFT PROSTATE ARTERY EMBOLIZATION TECHNIQUE: Informed consent was obtained from the patient following explanation of the procedure, risks, benefits and alternatives. The patient understands, agrees and consents for the procedure. All questions were addressed. A time out was performed prior to the  initiation of the procedure. Maximal barrier sterile technique utilized including  caps, mask, sterile gowns, sterile gloves, large sterile drape, hand hygiene, and chlorhexidine  prep. Intravenous Fentanyl  200mcg and Versed  4mg  were administered by RN during a total moderate (conscious) sedation time of 166 minutes; the patient's level of consciousness and physiological / cardiorespiratory status were monitored continuously by radiology RN under my direct supervision. A micropuncture set was used access the right common femoral artery under ultrasound. Bentson wire advanced into the infrarenal aorta. 5 French vascular sheath placed. Through this, a Sos catheter placed across the aortic bifurcation and Glidewire advanced to the left SFA. The Sos was exchanged for a 4 French straight glide catheter, advanced to the left SFA. Guidewire exchanged for stiff allied, then the glide catheter was exchanged for a long 35 cm 5 French sheath, advanced to the distal left common iliac artery. Through this, a C2 catheter was advanced into the proximal anterior division of the left internal iliac artery. Selective arteriography was obtained. The anterior tibial artery was selectively catheterized with a coaxial microcatheter and selective arteriography was performed. The left prostatic artery was catheterized with a microcatheter and selective arteriography and cone beam CT or performed, confirming flow to the prostate without significant nontarget enhancement. 50 mcg nitroglycerin  were administered intra-arterially into the proximal left prostatic artery. Branches of the left prostatic artery were embolized with 100-300 micron embospheres to near cessation of flow. Follow-up arteriogram significant demonstrates significant pruning of branches to the prostate with significant stasis in the proximal left prostatic artery. The microcatheter and angiographic catheter removed. The long 5 French sheath was exchanged over the Bentson wire  for a irregular 5 French she. The Sos catheter was again utilized to with the aid of a Bentson wire to catheterize the internal iliac artery for selective cone beam CT. There was into terminated localization of possible prostatic artery branch arising from the distal internal pudendal artery. Multiple attempts with a broad a of microcatheter and guidewire combinations were attempted but this vessel segment cannot be adequately catheterized. No other conspicuous candidates for right prostatic artery were identified. The microcatheter and angiographic catheter were withdrawn. The sheath was removed, hemostasis achieved, and closure obtained with 5 French Celt device. The patient tolerated the procedure well was transferred to the recovery area in good condition. FLUOROSCOPY: Radiation Exposure Index (as provided by the fluoroscopic device): 3208 mGy air Kerma COMPLICATIONS: COMPLICATIONS none IMPRESSION: 1. Technically successful left prostate artery embolization. 2. Deferred right prostate artery embolization due indeterminate localization and failure to catheterize suspected origin. Electronically Signed   By: JONETTA Faes M.D.   On: 09/20/2024 17:00   IR Angiogram Selective Each Additional Vessel Result Date: 09/20/2024 CLINICAL DATA:  Benign prostatic hypertrophy with persistent lower urinary tract symptoms. See previous consultation. EXAM: EXAM LEFT PROSTATE ARTERY EMBOLIZATION TECHNIQUE: Informed consent was obtained from the patient following explanation of the procedure, risks, benefits and alternatives. The patient understands, agrees and consents for the procedure. All questions were addressed. A time out was performed prior to the initiation of the procedure. Maximal barrier sterile technique utilized including caps, mask, sterile gowns, sterile gloves, large sterile drape, hand hygiene, and chlorhexidine  prep. Intravenous Fentanyl  200mcg and Versed  4mg  were administered by RN during a total moderate  (conscious) sedation time of 166 minutes; the patient's level of consciousness and physiological / cardiorespiratory status were monitored continuously by radiology RN under my direct supervision. A micropuncture set was used access the right common femoral artery under ultrasound. Bentson wire advanced into the infrarenal aorta. 5 French vascular sheath placed.  Through this, a Sos catheter placed across the aortic bifurcation and Glidewire advanced to the left SFA. The Sos was exchanged for a 4 French straight glide catheter, advanced to the left SFA. Guidewire exchanged for stiff allied, then the glide catheter was exchanged for a long 35 cm 5 French sheath, advanced to the distal left common iliac artery. Through this, a C2 catheter was advanced into the proximal anterior division of the left internal iliac artery. Selective arteriography was obtained. The anterior tibial artery was selectively catheterized with a coaxial microcatheter and selective arteriography was performed. The left prostatic artery was catheterized with a microcatheter and selective arteriography and cone beam CT or performed, confirming flow to the prostate without significant nontarget enhancement. 50 mcg nitroglycerin  were administered intra-arterially into the proximal left prostatic artery. Branches of the left prostatic artery were embolized with 100-300 micron embospheres to near cessation of flow. Follow-up arteriogram significant demonstrates significant pruning of branches to the prostate with significant stasis in the proximal left prostatic artery. The microcatheter and angiographic catheter removed. The long 5 French sheath was exchanged over the Bentson wire for a irregular 5 French she. The Sos catheter was again utilized to with the aid of a Bentson wire to catheterize the internal iliac artery for selective cone beam CT. There was into terminated localization of possible prostatic artery branch arising from the distal  internal pudendal artery. Multiple attempts with a broad a of microcatheter and guidewire combinations were attempted but this vessel segment cannot be adequately catheterized. No other conspicuous candidates for right prostatic artery were identified. The microcatheter and angiographic catheter were withdrawn. The sheath was removed, hemostasis achieved, and closure obtained with 5 French Celt device. The patient tolerated the procedure well was transferred to the recovery area in good condition. FLUOROSCOPY: Radiation Exposure Index (as provided by the fluoroscopic device): 3208 mGy air Kerma COMPLICATIONS: COMPLICATIONS none IMPRESSION: 1. Technically successful left prostate artery embolization. 2. Deferred right prostate artery embolization due indeterminate localization and failure to catheterize suspected origin. Electronically Signed   By: JONETTA Faes M.D.   On: 09/20/2024 17:00   IR Angiogram Selective Each Additional Vessel Result Date: 09/20/2024 CLINICAL DATA:  Benign prostatic hypertrophy with persistent lower urinary tract symptoms. See previous consultation. EXAM: EXAM LEFT PROSTATE ARTERY EMBOLIZATION TECHNIQUE: Informed consent was obtained from the patient following explanation of the procedure, risks, benefits and alternatives. The patient understands, agrees and consents for the procedure. All questions were addressed. A time out was performed prior to the initiation of the procedure. Maximal barrier sterile technique utilized including caps, mask, sterile gowns, sterile gloves, large sterile drape, hand hygiene, and chlorhexidine  prep. Intravenous Fentanyl  200mcg and Versed  4mg  were administered by RN during a total moderate (conscious) sedation time of 166 minutes; the patient's level of consciousness and physiological / cardiorespiratory status were monitored continuously by radiology RN under my direct supervision. A micropuncture set was used access the right common femoral artery under  ultrasound. Bentson wire advanced into the infrarenal aorta. 5 French vascular sheath placed. Through this, a Sos catheter placed across the aortic bifurcation and Glidewire advanced to the left SFA. The Sos was exchanged for a 4 French straight glide catheter, advanced to the left SFA. Guidewire exchanged for stiff allied, then the glide catheter was exchanged for a long 35 cm 5 French sheath, advanced to the distal left common iliac artery. Through this, a C2 catheter was advanced into the proximal anterior division of the left internal iliac artery. Selective  arteriography was obtained. The anterior tibial artery was selectively catheterized with a coaxial microcatheter and selective arteriography was performed. The left prostatic artery was catheterized with a microcatheter and selective arteriography and cone beam CT or performed, confirming flow to the prostate without significant nontarget enhancement. 50 mcg nitroglycerin  were administered intra-arterially into the proximal left prostatic artery. Branches of the left prostatic artery were embolized with 100-300 micron embospheres to near cessation of flow. Follow-up arteriogram significant demonstrates significant pruning of branches to the prostate with significant stasis in the proximal left prostatic artery. The microcatheter and angiographic catheter removed. The long 5 French sheath was exchanged over the Bentson wire for a irregular 5 French she. The Sos catheter was again utilized to with the aid of a Bentson wire to catheterize the internal iliac artery for selective cone beam CT. There was into terminated localization of possible prostatic artery branch arising from the distal internal pudendal artery. Multiple attempts with a broad a of microcatheter and guidewire combinations were attempted but this vessel segment cannot be adequately catheterized. No other conspicuous candidates for right prostatic artery were identified. The microcatheter and  angiographic catheter were withdrawn. The sheath was removed, hemostasis achieved, and closure obtained with 5 French Celt device. The patient tolerated the procedure well was transferred to the recovery area in good condition. FLUOROSCOPY: Radiation Exposure Index (as provided by the fluoroscopic device): 3208 mGy air Kerma COMPLICATIONS: COMPLICATIONS none IMPRESSION: 1. Technically successful left prostate artery embolization. 2. Deferred right prostate artery embolization due indeterminate localization and failure to catheterize suspected origin. Electronically Signed   By: JONETTA Faes M.D.   On: 09/20/2024 17:00     Procedures   Medications Ordered in the ED  HYDROmorphone  (DILAUDID ) injection 0.5 mg (has no administration in time range)  sodium chloride  0.9 % bolus 500 mL (500 mLs Intravenous New Bag/Given 09/22/24 0755)  fentaNYL  (SUBLIMAZE ) injection 25 mcg (25 mcg Intravenous Given 09/22/24 0756)                                    Medical Decision Making Adult male 2 days status post IR procedure with known prostatomegaly presents with hematuria, dysuria, pain.  Broad differential including postop pain, interventional bleeding, infection. UA labs bladder scan started, fentanyl  fluids monitoring commenced. Cardiac 90 sinus normal pulse ox 95% room air normal  Amount and/or Complexity of Data Reviewed Independent Historian:     Details: Adult son External Data Reviewed: notes.    Details: Interventional radiology notes urology notes Labs: ordered. Decision-making details documented in ED Course. Radiology: ordered.  Risk Prescription drug management. Decision regarding hospitalization. Diagnosis or treatment significantly limited by social determinants of health.   10:56 AM Patient with persistent pain in spite of initial fentanyl , fluids. Gross hematuria noted on urinalysis, nitrate positive, leukocyte negative, labs otherwise essentially 9 contributory. Patient remains  hemodynamically stable, will receive additional analgesics have discussed his case with our urology colleagues, the patient has CT hematuria protocol pending, additional analgesics ordered, urology will see.  At bedside I discussed the patient's presentation with our urology colleagues have seen, evaluated the patient, placed Foley catheter. We reviewed the patient CT imaging, labs, for more concerning for hemorrhage prostate, latter for concern for nitrate positive urinalysis, labs otherwise generally reassuring.  Positive nitrate value may be secondary to Azo postop, versus blood.  Patient has no current evidence for bacteremia, sepsis, vitals are appropriate, now after  placement of Foley catheter is feeling much better, but with ongoing bleeding, likely related to his prostate hemorrhage post procedure, Xarelto  need, patient admitted to our internal medicine colleagues, with urology following as a consulting service.    Final diagnoses:  Bleeding  Pain    ED Discharge Orders     None          Garrick Charleston, MD 09/22/24 [phone]

## 2024-09-22 NOTE — Progress Notes (Addendum)
   Procedure: insert indwelling catheter bedside   Urology Procedure Note:  89 year old male known to our practice with severe BPH, near 150 G prostate.  S/p prostatic artery embolization on 09/20/2024.  Patient reports traumatic Foley insertion prior to procedure with bleeding since, reporting to the emergency department with frank blood and clot material per urethra.  Shared decision was made to place Foley catheter and start continuous bladder irrigation if necessary.  Patient was prepped and draped in the usual sterile fashion.  10 mL of lidocaine  infused sterile lubricant was injected directly into the urethra.  After adequate time for anesthetic effect had been provided, a 44f three-way catheter was advanced into the bladder without resistance.  There was an immediate return of clear yellow urine, confirming distal trauma.  Foley catheter was placed to gravity drainage without dependent loops.  Third port was plugged.  This concluded the procedure   Ole Bourdon, NP [facility] Pager: [phone]

## 2024-09-23 ENCOUNTER — Encounter (HOSPITAL_COMMUNITY): Payer: Self-pay | Admitting: Internal Medicine

## 2024-09-23 DIAGNOSIS — R1024 Suprapubic pain: Secondary | ICD-10-CM | POA: Diagnosis not present

## 2024-09-23 DIAGNOSIS — R300 Dysuria: Secondary | ICD-10-CM | POA: Diagnosis not present

## 2024-09-23 DIAGNOSIS — R310 Gross hematuria: Secondary | ICD-10-CM

## 2024-09-23 DIAGNOSIS — R339 Retention of urine, unspecified: Secondary | ICD-10-CM | POA: Diagnosis not present

## 2024-09-23 LAB — CBC
HCT: 40.9 % (ref 39.0–52.0)
Hemoglobin: 13.9 g/dL (ref 13.0–17.0)
MCH: 32.2 pg (ref 26.0–34.0)
MCHC: 34 g/dL (ref 30.0–36.0)
MCV: 94.7 fL (ref 80.0–100.0)
Platelets: 112 K/uL — ABNORMAL LOW (ref 150–400)
RBC: 4.32 MIL/uL (ref 4.22–5.81)
RDW: 12.7 % (ref 11.5–15.5)
WBC: 10.4 K/uL (ref 4.0–10.5)
nRBC: 0 % (ref 0.0–0.2)

## 2024-09-23 MED ORDER — IPRATROPIUM-ALBUTEROL 0.5-2.5 (3) MG/3ML IN SOLN: 3.0000 mL | RESPIRATORY_TRACT | Status: DC | PRN

## 2024-09-23 MED ORDER — ONDANSETRON HCL 4 MG/2ML IJ SOLN
4.0000 mg | Freq: Four times a day (QID) | INTRAMUSCULAR | Status: DC | PRN
  Administered 2024-09-23: 4 mg via INTRAVENOUS
  Filled 2024-09-23: qty 2

## 2024-09-23 MED ORDER — MIRABEGRON ER 25 MG PO TB24
25.0000 mg | ORAL_TABLET | Freq: Every day | ORAL | Status: DC
  Administered 2024-09-23: 25 mg via ORAL
  Filled 2024-09-23 (×2): qty 1

## 2024-09-23 MED ORDER — SENNOSIDES-DOCUSATE SODIUM 8.6-50 MG PO TABS
1.0000 | ORAL_TABLET | Freq: Every evening | ORAL | Status: DC | PRN
  Administered 2024-09-23: 1 via ORAL
  Filled 2024-09-23: qty 1

## 2024-09-23 MED ORDER — GLUCAGON HCL RDNA (DIAGNOSTIC) 1 MG IJ SOLR: 1.0000 mg | INTRAMUSCULAR | Status: DC | PRN

## 2024-09-23 MED ORDER — HYOSCYAMINE SULFATE 0.125 MG SL SUBL
0.1250 mg | SUBLINGUAL_TABLET | SUBLINGUAL | Status: DC | PRN
Start: 2024-09-23 — End: 2024-09-25
  Administered 2024-09-23 (×2): 0.125 mg via ORAL
  Filled 2024-09-23 (×3): qty 1

## 2024-09-23 MED ORDER — HYDRALAZINE HCL 20 MG/ML IJ SOLN: 10.0000 mg | INTRAMUSCULAR | Status: DC | PRN

## 2024-09-23 NOTE — Progress Notes (Signed)
 PT Cancellation Note  Patient Details Name: Stanley Waters MRN: [id number] DOB: [birth year]   Cancelled Treatment:    Reason Eval/Treat Not Completed: Pain limiting ability to participate;Other (comment). Arrived to room at 15:10, but pt politely declines eval. Reports got back in bed less than 10 minutes ago with assist of nurse tech and reports in a lot of pain. Will cotinue to follow for PT eval as schedule permits.   Metta Ave PT, DPT 09/23/24, 3:23 PM

## 2024-09-23 NOTE — Plan of Care (Signed)

## 2024-09-23 NOTE — Progress Notes (Signed)
 PROGRESS NOTE    Stanley Waters  FMW:[id number] DOB: Jul 31, 1935 DOA: 09/22/2024 PCP: Clarice Nottingham, MD    Brief Narrative:  89 year old with history of severe BPH with severe urethral stenosis status post left-sided prostate artery embolization 09/20/2024, P A-fib status post cardioversion on Tikosyn  and Xarelto , HTN, HLD, peripheral neuropathy admitted for persistent hematuria.  CT abdomen pelvis showed markedly enlarged prostate.  Urology was consulted who placed a Foley catheter in.   Assessment & Plan:  Acute urinary retention with recurrent hematuria Severe BPH status post left prostate artery embolization 11/19 - Seen by urology, Foley catheter placed.  Currently anticoagulation is on hold.  Closely monitor hemoglobin levels. -Continue empiric IV Rocephin  - On Cardura   Paroxysmal atrial fibrillation - Currently in sinus rhythm on Tikosyn .  Holding Xarelto   Essential hypertension Congestive heart failure with preserved ejection fraction - Currently on daily Lasix .  Will closely monitor volume levels. -IV as needed   DVT prophylaxis: SCDs Start: 09/22/24 1507    Code Status: Full Code Family Communication:   Continue hospital stay until improvement in his hematuria and urology clearance. PT Follow up Recs:   Subjective:  Seen at bedside Still having some hematuria  Examination:  General exam: Appears calm and comfortable  Respiratory system: Clear to auscultation. Respiratory effort normal. Cardiovascular system: S1 & S2 heard, RRR. No JVD, murmurs, rubs, gallops or clicks. No pedal edema. Gastrointestinal system: Abdomen is nondistended, soft and nontender. No organomegaly or masses felt. Normal bowel sounds heard. Central nervous system: Alert and oriented. No focal neurological deficits. Extremities: Symmetric 5 x 5 power. Skin: No rashes, lesions or ulcers Psychiatry: Judgement and insight appear normal. Mood & affect appropriate. Foley in place with some  hematuria               Diet Orders (From admission, onward)     Start     Ordered   09/22/24 1509  Diet Heart Room service appropriate? Yes; Fluid consistency: Thin  Diet effective now       Question Answer Comment  Room service appropriate? Yes   Fluid consistency: Thin      09/22/24 1509            Objective: Vitals:   09/22/24 1854 09/22/24 2254 09/23/24 0351 09/23/24 0604  BP: (!) 168/82 (!) 143/68 138/67   Pulse: 100 71 64   Resp: 16 18 18    Temp: 99.6 F (37.6 C) 99.5 F (37.5 C) 98.5 F (36.9 C)   TempSrc: Oral Oral Oral   SpO2: 97% 98% 94%   Weight:    92.9 kg  Height:    6' (1.829 m)    Intake/Output Summary (Last 24 hours) at 09/23/2024 1127 Last data filed at 09/23/2024 1000 Gross per 24 hour  Intake 100 ml  Output 1050 ml  Net -950 ml   Filed Weights   09/23/24 0604  Weight: 92.9 kg    Scheduled Meds:  Chlorhexidine  Gluconate Cloth  6 each Topical Daily   dofetilide   250 mcg Oral BID   doxazosin   8 mg Oral QHS   ezetimibe   10 mg Oral Daily   furosemide   20 mg Oral Daily   mirabegron  ER  25 mg Oral Daily   polyethylene glycol  17 g Oral QHS   rOPINIRole   0.5 mg Oral QHS   Continuous Infusions:  cefTRIAXone  (ROCEPHIN )  IV      Nutritional status     Body mass index is 27.78 kg/m.  Data Reviewed:  CBC: Recent Labs  Lab 09/20/24 0945 09/22/24 0756 09/23/24 0727  WBC 4.6 10.4 10.4  NEUTROABS 3.5 9.1*  --   HGB 15.1 14.3 13.9  HCT 44.0 42.1 40.9  MCV 92.6 94.2 94.7  PLT 125* 108* 112*   Basic Metabolic Panel: Recent Labs  Lab 09/20/24 0945 09/22/24 0756  NA 138 132*  K 4.1 4.2  CL 102 99  CO2 27 24  GLUCOSE 111* 142*  BUN 14 15  CREATININE 0.92 1.22  CALCIUM  9.0 8.9   GFR: Estimated Creatinine Clearance: 46.8 mL/min (by C-G formula based on SCr of 1.22 mg/dL). Liver Function Tests: Recent Labs  Lab 09/20/24 0945 09/22/24 0756  AST 18 26  ALT 11 11  ALKPHOS 65 62  BILITOT 0.9 1.4*  PROT 7.2 6.9   ALBUMIN 4.1 4.0   No results for input(s): LIPASE, AMYLASE in the last 168 hours. No results for input(s): AMMONIA in the last 168 hours. Coagulation Profile: Recent Labs  Lab 09/20/24 0945  INR 1.2   Cardiac Enzymes: No results for input(s): CKTOTAL, CKMB, CKMBINDEX, TROPONINI in the last 168 hours. BNP (last 3 results) No results for input(s): PROBNP in the last 8760 hours. HbA1C: No results for input(s): HGBA1C in the last 72 hours. CBG: No results for input(s): GLUCAP in the last 168 hours. Lipid Profile: No results for input(s): CHOL, HDL, LDLCALC, TRIG, CHOLHDL, LDLDIRECT in the last 72 hours. Thyroid  Function Tests: No results for input(s): TSH, T4TOTAL, FREET4, T3FREE, THYROIDAB in the last 72 hours. Anemia Panel: No results for input(s): VITAMINB12, FOLATE, FERRITIN, TIBC, IRON, RETICCTPCT in the last 72 hours. Sepsis Labs: No results for input(s): PROCALCITON, LATICACIDVEN in the last 168 hours.  No results found for this or any previous visit (from the past 240 hours).       Radiology Studies: CT HEMATURIA WORKUP Result Date: 09/22/2024 CLINICAL DATA:  Hematuria. History of benign prostatic hypertrophy status post left prostate artery embolization 09/20/2024 EXAM: CT ABDOMEN AND PELVIS WITHOUT AND WITH CONTRAST TECHNIQUE: Multidetector CT imaging of the abdomen and pelvis was performed following the standard protocol before and following the bolus administration of intravenous contrast. RADIATION DOSE REDUCTION: This exam was performed according to the departmental dose-optimization program which includes automated exposure control, adjustment of the mA and/or kV according to patient size and/or use of iterative reconstruction technique. CONTRAST:  OMNIPAQUE  IOHEXOL  300 MG/ML  SOLN COMPARISON:  CT abdomen dated 12/10/2022, CTA pelvis dated 08/10/2024 FINDINGS: Lower chest: Bibasilar dendriform  ossification. No pleural effusion or pneumothorax demonstrated. Partially imaged multichamber cardiomegaly. Hepatobiliary: No focal hepatic lesions. No intra or extrahepatic biliary ductal dilation. Cholelithiasis. Pancreas: No focal lesions or main ductal dilation. Spleen: Nonenhancing 2.7 cm splenic hypodensity (3:33), unchanged from 12/10/2022, likely benign. Mildly enlarged spleen measures 14.6 cm, previously 13.8 cm on 12/10/2022. Additional subcentimeter hypodensity, too small to characterize, but also likely cyst. Adrenals/Urinary Tract: No adrenal nodules. Duplex right kidney. No suspicious renal mass, calculi or hydronephrosis. 1.7 cm simple right parapelvic cyst (12:33). No specific follow-up imaging recommended. Segmental mild right ureteral narrowing, which may be related to peristalsis. No definite filling defects. Mildly trabeculated urinary bladder. Stomach/Bowel: Normal appearance of the stomach. No evidence of bowel wall thickening, distention, or inflammatory changes. Colonic diverticulosis without acute diverticulitis. Normal appendix. Vascular/Lymphatic: Aortic atherosclerosis. No enlarged abdominal or pelvic lymph nodes. Reproductive: Markedly enlarged prostate gland. 1.5 cm rounded hyperdensity in the left prostatic base (3:76), new compared to 08/10/2024, likely hemorrhage. Other: No free fluid, fluid  collection, or free air. Musculoskeletal: No acute or abnormal lytic or blastic osseous lesions. Multilevel degenerative changes of the partially imaged thoracic and lumbar spine. Small fat-containing right inguinal hernia. Postsurgical changes of left anterior lower abdominal wall mesh hernia repair. Postsurgical changes of right inguinal region. IMPRESSION: 1. Markedly enlarged prostate gland with 1.5 cm rounded hyperdensity in the left prostatic base, new compared to 08/10/2024, likely hemorrhage in the setting of recent left prostate artery embolization. 2. No urolithiasis, filling defects,  or urinary tract dilation. No suspicious renal or urothelial lesions. 3. Increased mild splenomegaly. 4.  Aortic Atherosclerosis (ICD10-I70.0). Electronically Signed   By: Limin  Xu M.D.   On: 09/22/2024 12:28           LOS: 0 days   Time spent= 35 mins    [place]ess JAYSON Dare, MD [facility]  If 7PM-7AM, please contact night-coverage  09/23/2024, 11:27 AM

## 2024-09-23 NOTE — Hospital Course (Addendum)
 Brief Narrative:  89 year old with history of severe BPH with severe urethral stenosis status post left-sided prostate artery embolization 09/20/2024, P A-fib status post cardioversion on Tikosyn  and Xarelto , HTN, HLD, peripheral neuropathy admitted for persistent hematuria.  CT abdomen pelvis showed markedly enlarged prostate.  Urology was consulted who placed a Foley catheter in.   Assessment & Plan:  Acute urinary retention with recurrent hematuria Severe BPH status post left prostate artery embolization 11/19 - Seen by urology, Foley catheter placed.  Currently anticoagulation is on hold.  Closely monitor hemoglobin levels. -Continue empiric IV Rocephin  - On Cardura   Paroxysmal atrial fibrillation - Currently in sinus rhythm on Tikosyn .  Holding Xarelto   Essential hypertension Congestive heart failure with preserved ejection fraction - Currently on daily Lasix .  Will closely monitor volume levels. -IV as needed   DVT prophylaxis: SCDs Start: 09/22/24 1507    Code Status: Full Code Family Communication:   Continue hospital stay until improvement in his hematuria and urology clearance. PT Follow up Recs:   Subjective:  Seen at bedside Still having some hematuria  Examination:  General exam: Appears calm and comfortable  Respiratory system: Clear to auscultation. Respiratory effort normal. Cardiovascular system: S1 & S2 heard, RRR. No JVD, murmurs, rubs, gallops or clicks. No pedal edema. Gastrointestinal system: Abdomen is nondistended, soft and nontender. No organomegaly or masses felt. Normal bowel sounds heard. Central nervous system: Alert and oriented. No focal neurological deficits. Extremities: Symmetric 5 x 5 power. Skin: No rashes, lesions or ulcers Psychiatry: Judgement and insight appear normal. Mood & affect appropriate. Foley in place with some hematuria

## 2024-09-23 NOTE — Consult Note (Cosign Needed Addendum)
 Urology Consult Note   Requesting Attending Physician:  Caleen Burgess BROCKS, MD Service Providing Consult: Urology  Consulting Attending: Dr. Sherrilee   Reason for Consult: Gross hematuria  HPI: Stanley Waters is seen in consultation for reasons noted above at the request of Caleen Burgess BROCKS, MD. Patient is a 89 y.o. male presenting to [facility] emergency department with suprapubic pain, hematuria, dysuria, and gross blood via penis.  S/p prostatic artery embolization with IR on 09/20/2024.  Patient is known to our practice and followed by Dr. Watt where he has been followed for nocturia, frequency, and urgency.  Imaging and cystoscopy have revealed severe prostatic hyperplasia, around 150 mL with an enlarged obstructing median lobe and moderate bladder trabeculation.  On arrival patient was alert, oriented, and in no distress.  He reported severe pain during catheter placement prior to his PAE with bleeding afterwards, which she reports doing since.  He began taking his Xarelto  yesterday which has exacerbated his bleeding.  We were accompanied by interventional radiology and his son during patient exam.  Interval 11/22: Pt AFVSS. Urine thin, strawberry on morning rounds. Not on CBI. Pt reports bladder spasms. Myrbetriq  and Levsin  ordered.   Hgb 13.9 from 14.3.  ------------------  Assessment:   89 y.o. male with dysuria, suprapubic pain, and gross hematuria following prostatic artery embolization 2 days ago.  Followed by Dr. Watt   Recommendations: # Gross hematuria # Suprapubic pain # Dysuria  Continue foley catheter to drainage   Scheduled Myrbetriq  and as needed Levsin  for spasm.  Would like to washout Xarelto  and refrain from restarting until at least 24 hours free of bleeding.  Risk stratification per cardiology  Hand irrigate as needed.  Urology will follow  Case and plan discussed with Dr. Sherrilee  Past Medical History: Past Medical History:  Diagnosis Date    Atrial flutter (HCC)    Atypical chest pain    Myoview performed 07/23/11 was completely normal. Post stress EF 61%.   Bruit    Left asymptomatic bruit. Carotid Duplex 12/13/12 =mildly abnormal. *BILATERAL BULB/PROXIMAL ICAs: Demonstrated a mild amount of fibrous plaque w/no evidence od significant diameter reduction, tortuosity or other vascular abnormality.   Enlarged prostate    BPH - PSA of 6.7 this is consistant with his last PSA of 6.3   Hyperlipemia    Hypertension    Inguinal hernia    Inguinal hernia repair (x) 2 1991, 2011   Intestinal polyps 09/22/11   Colonoscopy removed a 2 mm sessile cecal polyp with a cold snare.   Measles    Mumps    Normal coronary arteries 2002   Peripheral neuropathy    Persistent atrial fibrillation (HCC)    Seasonal allergies    Skin cancer    Thrombocytopenia 2011   Very mild with platelets of 135,000.   Tinnitus    Umbilical hernia    Vitamin D  deficiency     Past Surgical History:  Past Surgical History:  Procedure Laterality Date   ATRIAL FIBRILLATION ABLATION  09/20/2018   ATRIAL FIBRILLATION ABLATION N/A 09/20/2018   Procedure: ATRIAL FIBRILLATION ABLATION;  Surgeon: Kelsie Agent, MD;  Location: [facility] INVASIVE CV LAB;  Service: Cardiovascular;  Laterality: N/A;   CARDIAC CATHETERIZATION  2002   Normal cardiac catheterization   CARDIOVERSION  07/07/2012   A fib - Cardioversion successful.   CARDIOVERSION N/A 06/30/2013   Procedure: CARDIOVERSION;  Surgeon: Jerel Balding, MD;  Location: [facility] OR;  Service: Cardiovascular;  Laterality: N/A;  CARDIOVERSION N/A 08/02/2018   Procedure: CARDIOVERSION;  Surgeon: Pietro Redell RAMAN, MD;  Location: [facility] ENDOSCOPY;  Service: Cardiovascular;  Laterality: N/A;   Carotid Duplex  12/13/12   For asymptomatic bruit. Mildly abnormal.  *BILATERAL BULB/PROXIMAL ICAs: Demonstrated a mild amount of fibrous plaque w/no evidence of significant diameter reduction, tortuosity or other vascular abnormality.     COLONOSCOPY W/ POLYPECTOMY  09/22/11   2 mm sessile cecal polyp was removed with a cold snare.   HAND SURGERY     INGUINAL HERNIA REPAIR  1991 & 2011   IR 3D INDEPENDENT WKST  09/20/2024   IR ANGIOGRAM PELVIS SELECTIVE OR SUPRASELECTIVE  09/20/2024   IR ANGIOGRAM SELECTIVE EACH ADDITIONAL VESSEL  09/20/2024   IR ANGIOGRAM SELECTIVE EACH ADDITIONAL VESSEL  09/20/2024   IR ANGIOGRAM SELECTIVE EACH ADDITIONAL VESSEL  09/20/2024   IR EMBO TUMOR ORGAN ISCHEMIA INFARCT INC GUIDE ROADMAPPING  09/20/2024   IR US  GUIDE VASC ACCESS LEFT  09/20/2024   IR US  GUIDE VASC ACCESS RIGHT  09/20/2024   TEE WITHOUT CARDIOVERSION  07/07/2012   Procedure: TRANSESOPHAGEAL ECHOCARDIOGRAM (TEE);  Surgeon: Vinie KYM Maxcy, MD;  Location: [facility] ENDOSCOPY;  Service: Cardiovascular;  Laterality: N/A;   TEE WITHOUT CARDIOVERSION N/A 08/02/2018   Procedure: TRANSESOPHAGEAL ECHOCARDIOGRAM (TEE);  Surgeon: Pietro Redell RAMAN, MD;  Location: [facility] ENDOSCOPY;  Service: Cardiovascular;  Laterality: N/A;    Medication: Current Facility-Administered Medications  Medication Dose Route Frequency Provider Last Rate Last Admin   acetaminophen  (TYLENOL ) tablet 650 mg  650 mg Oral Q6H PRN Laurita Cort DASEN, MD       Or   acetaminophen  (TYLENOL ) suppository 650 mg  650 mg Rectal Q6H PRN Laurita Cort DASEN, MD       cefTRIAXone  (ROCEPHIN ) 1 g in sodium chloride  0.9 % 100 mL IVPB  1 g Intravenous Q24H Laurita Cort DASEN, MD       Chlorhexidine  Gluconate Cloth 2 % PADS 6 each  6 each Topical Daily Laurita Cort DASEN, MD   6 each at 09/23/24 9176   dofetilide  (TIKOSYN ) capsule 250 mcg  250 mcg Oral BID Laurita Cort DASEN, MD   250 mcg at 09/23/24 9265   doxazosin  (CARDURA ) tablet 8 mg  8 mg Oral QHS Laurita Cort T, MD   8 mg at 09/22/24 2222   ezetimibe  (ZETIA ) tablet 10 mg  10 mg Oral Daily Zhang, Ping T, MD   10 mg at 09/23/24 9264   furosemide  (LASIX ) tablet 20 mg  20 mg Oral Daily Zhang, Ping T, MD   20 mg at 09/23/24 9264   glucagon  (human recombinant) (GLUCAGEN)  injection 1 mg  1 mg Intravenous PRN Amin, Ankit C, MD       hydrALAZINE  (APRESOLINE ) injection 10 mg  10 mg Intravenous Q4H PRN Amin, Ankit C, MD       HYDROmorphone  (DILAUDID ) injection 0.5-1 mg  0.5-1 mg Intravenous Q2H PRN Laurita Cort T, MD       hyoscyamine  (LEVSIN  SL) SL tablet 0.125 mg  0.125 mg Oral Q4H PRN McKenzie, Belvie CROME, MD       ipratropium-albuterol  (DUONEB) 0.5-2.5 (3) MG/3ML nebulizer solution 3 mL  3 mL Nebulization Q4H PRN Amin, Ankit C, MD       lisinopril  (ZESTRIL ) tablet 20 mg  20 mg Oral Daily PRN Laurita Cort DASEN, MD       mirabegron  ER (MYRBETRIQ ) tablet 25 mg  25 mg Oral Daily McKenzie, Belvie CROME, MD       ondansetron  (ZOFRAN )  injection 4 mg  4 mg Intravenous Q6H PRN Amin, Ankit C, MD       oxyCODONE -acetaminophen  (PERCOCET/ROXICET) 5-325 MG per tablet 1 tablet  1 tablet Oral Q6H PRN Laurita Manor T, MD   1 tablet at 09/23/24 0603   polyethylene glycol (MIRALAX  / GLYCOLAX ) packet 17 g  17 g Oral QHS Laurita Manor T, MD   17 g at 09/22/24 2222   rOPINIRole  (REQUIP ) tablet 0.5 mg  0.5 mg Oral QHS Laurita Manor T, MD   0.5 mg at 09/22/24 2220   senna-docusate (Senokot-S) tablet 1 tablet  1 tablet Oral QHS PRN Amin, Ankit C, MD        Allergies: Allergies  Allergen Reactions   Prednisone  Swelling, Other (See Comments) and Hypertension    Tachycardia, also   Trazodone  Hcl Other (See Comments)   Doxycycline Rash    Social History: Social History   Tobacco Use   Smoking status: Former    Current packs/day: 0.00    Types: Cigarettes    Quit date: 11/03/1963    Years since quitting: 60.9   Smokeless tobacco: Never   Tobacco comments:    Former smoker 12/21/22  Vaping Use   Vaping status: Never Used  Substance Use Topics   Alcohol use: No   Drug use: No    Family History Family History  Problem Relation Age of Onset   Heart failure Brother     ROS   Objective   Vital signs in last 24 hours: BP 138/67 (BP Location: Left Arm)   Pulse 64   Temp 98.5 F (36.9  C) (Oral)   Resp 18   Ht 6' (1.829 m)   Wt 92.9 kg   SpO2 94%   BMI 27.78 kg/m   Physical Exam General: A&O, resting, appropriate HEENT: [place]/AT Pulmonary: Normal work of breathing Cardiovascular: no cyanosis GU: Frequent urination.  Frank blood and clots per urethra   Most Recent Labs: Lab Results  Component Value Date   WBC 10.4 09/23/2024   HGB 13.9 09/23/2024   HCT 40.9 09/23/2024   PLT 112 (L) 09/23/2024    Lab Results  Component Value Date   NA 132 (L) 09/22/2024   K 4.2 09/22/2024   CL 99 09/22/2024   CO2 24 09/22/2024   BUN 15 09/22/2024   CREATININE 1.22 09/22/2024   CALCIUM  8.9 09/22/2024   MG 2.1 04/13/2024    Lab Results  Component Value Date   INR 1.2 09/20/2024     Urine Culture: @[id number] (laburin,org,[id number],[id number])@   IMAGING: CT HEMATURIA WORKUP Result Date: 09/22/2024 CLINICAL DATA:  Hematuria. History of benign prostatic hypertrophy status post left prostate artery embolization 09/20/2024 EXAM: CT ABDOMEN AND PELVIS WITHOUT AND WITH CONTRAST TECHNIQUE: Multidetector CT imaging of the abdomen and pelvis was performed following the standard protocol before and following the bolus administration of intravenous contrast. RADIATION DOSE REDUCTION: This exam was performed according to the departmental dose-optimization program which includes automated exposure control, adjustment of the mA and/or kV according to patient size and/or use of iterative reconstruction technique. CONTRAST:  OMNIPAQUE  IOHEXOL  300 MG/ML  SOLN COMPARISON:  CT abdomen dated 12/10/2022, CTA pelvis dated 08/10/2024 FINDINGS: Lower chest: Bibasilar dendriform ossification. No pleural effusion or pneumothorax demonstrated. Partially imaged multichamber cardiomegaly. Hepatobiliary: No focal hepatic lesions. No intra or extrahepatic biliary ductal dilation. Cholelithiasis. Pancreas: No focal lesions or main ductal dilation. Spleen: Nonenhancing 2.7 cm splenic hypodensity (3:33),  unchanged from 12/10/2022, likely benign. Mildly enlarged spleen measures 14.6 cm,  previously 13.8 cm on 12/10/2022. Additional subcentimeter hypodensity, too small to characterize, but also likely cyst. Adrenals/Urinary Tract: No adrenal nodules. Duplex right kidney. No suspicious renal mass, calculi or hydronephrosis. 1.7 cm simple right parapelvic cyst (12:33). No specific follow-up imaging recommended. Segmental mild right ureteral narrowing, which may be related to peristalsis. No definite filling defects. Mildly trabeculated urinary bladder. Stomach/Bowel: Normal appearance of the stomach. No evidence of bowel wall thickening, distention, or inflammatory changes. Colonic diverticulosis without acute diverticulitis. Normal appendix. Vascular/Lymphatic: Aortic atherosclerosis. No enlarged abdominal or pelvic lymph nodes. Reproductive: Markedly enlarged prostate gland. 1.5 cm rounded hyperdensity in the left prostatic base (3:76), new compared to 08/10/2024, likely hemorrhage. Other: No free fluid, fluid collection, or free air. Musculoskeletal: No acute or abnormal lytic or blastic osseous lesions. Multilevel degenerative changes of the partially imaged thoracic and lumbar spine. Small fat-containing right inguinal hernia. Postsurgical changes of left anterior lower abdominal wall mesh hernia repair. Postsurgical changes of right inguinal region. IMPRESSION: 1. Markedly enlarged prostate gland with 1.5 cm rounded hyperdensity in the left prostatic base, new compared to 08/10/2024, likely hemorrhage in the setting of recent left prostate artery embolization. 2. No urolithiasis, filling defects, or urinary tract dilation. No suspicious renal or urothelial lesions. 3. Increased mild splenomegaly. 4.  Aortic Atherosclerosis (ICD10-I70.0). Electronically Signed   By: Limin  Xu M.D.   On: 09/22/2024 12:28

## 2024-09-24 ENCOUNTER — Observation Stay (HOSPITAL_COMMUNITY)

## 2024-09-24 DIAGNOSIS — R1110 Vomiting, unspecified: Secondary | ICD-10-CM | POA: Diagnosis not present

## 2024-09-24 DIAGNOSIS — K5750 Diverticulosis of both small and large intestine without perforation or abscess without bleeding: Secondary | ICD-10-CM | POA: Diagnosis not present

## 2024-09-24 DIAGNOSIS — K8020 Calculus of gallbladder without cholecystitis without obstruction: Secondary | ICD-10-CM | POA: Diagnosis not present

## 2024-09-24 DIAGNOSIS — R339 Retention of urine, unspecified: Secondary | ICD-10-CM | POA: Diagnosis not present

## 2024-09-24 DIAGNOSIS — K6389 Other specified diseases of intestine: Secondary | ICD-10-CM | POA: Diagnosis not present

## 2024-09-24 DIAGNOSIS — R140 Abdominal distension (gaseous): Secondary | ICD-10-CM | POA: Diagnosis not present

## 2024-09-24 DIAGNOSIS — N400 Enlarged prostate without lower urinary tract symptoms: Secondary | ICD-10-CM | POA: Diagnosis not present

## 2024-09-24 DIAGNOSIS — D7389 Other diseases of spleen: Secondary | ICD-10-CM | POA: Diagnosis not present

## 2024-09-24 LAB — BASIC METABOLIC PANEL WITH GFR
Anion gap: 10 (ref 5–15)
BUN: 13 mg/dL (ref 8–23)
CO2: 25 mmol/L (ref 22–32)
Calcium: 8.3 mg/dL — ABNORMAL LOW (ref 8.9–10.3)
Chloride: 98 mmol/L (ref 98–111)
Creatinine, Ser: 0.88 mg/dL (ref 0.61–1.24)
GFR, Estimated: >60 mL/min (ref >60)
Glucose, Bld: 127 mg/dL — ABNORMAL HIGH (ref 70–99)
Potassium: 3.8 mmol/L (ref 3.5–5.1)
Sodium: 133 mmol/L — ABNORMAL LOW (ref 135–145)

## 2024-09-24 LAB — CBC
HCT: 40.4 % (ref 39.0–52.0)
Hemoglobin: 13.9 g/dL (ref 13.0–17.0)
MCH: 32 pg (ref 26.0–34.0)
MCHC: 34.4 g/dL (ref 30.0–36.0)
MCV: 92.9 fL (ref 80.0–100.0)
Platelets: 130 K/uL — ABNORMAL LOW (ref 150–400)
RBC: 4.35 MIL/uL (ref 4.22–5.81)
RDW: 12.3 % (ref 11.5–15.5)
WBC: 9.1 K/uL (ref 4.0–10.5)
nRBC: 0 % (ref 0.0–0.2)

## 2024-09-24 LAB — MAGNESIUM: Magnesium: 2.3 mg/dL (ref 1.7–2.4)

## 2024-09-24 MED ORDER — MIRABEGRON ER 25 MG PO TB24
50.0000 mg | ORAL_TABLET | Freq: Every day | ORAL | Status: DC
  Administered 2024-09-24 – 2024-09-25 (×2): 50 mg via ORAL
  Filled 2024-09-24 (×2): qty 2

## 2024-09-24 MED ORDER — MAGNESIUM CITRATE PO SOLN
1.0000 | Freq: Once | ORAL | Status: AC
  Administered 2024-09-24: 1 via ORAL
  Filled 2024-09-24: qty 296

## 2024-09-24 MED ORDER — PROCHLORPERAZINE EDISYLATE 10 MG/2ML IJ SOLN
5.0000 mg | Freq: Four times a day (QID) | INTRAMUSCULAR | Status: AC | PRN
  Administered 2024-09-24: 5 mg via INTRAVENOUS
  Filled 2024-09-24: qty 2

## 2024-09-24 MED ORDER — POTASSIUM CHLORIDE CRYS ER 20 MEQ PO TBCR
40.0000 meq | EXTENDED_RELEASE_TABLET | Freq: Once | ORAL | Status: AC
  Administered 2024-09-24: 40 meq via ORAL
  Filled 2024-09-24: qty 2

## 2024-09-24 MED ORDER — LACTULOSE 10 GM/15ML PO SOLN
30.0000 g | Freq: Once | ORAL | Status: AC
  Administered 2024-09-24: 30 g via ORAL
  Filled 2024-09-24: qty 45

## 2024-09-24 MED ORDER — PROCHLORPERAZINE EDISYLATE 10 MG/2ML IJ SOLN
5.0000 mg | Freq: Once | INTRAMUSCULAR | Status: AC | PRN
  Administered 2024-09-24: 5 mg via INTRAVENOUS
  Filled 2024-09-24: qty 2

## 2024-09-24 MED ORDER — IOHEXOL 300 MG/ML  SOLN
100.0000 mL | Freq: Once | INTRAMUSCULAR | Status: AC | PRN
  Administered 2024-09-24: 100 mL via INTRAVENOUS

## 2024-09-24 NOTE — Care Management Obs Status (Signed)
 MEDICARE OBSERVATION STATUS NOTIFICATION   Patient Details  Name: JERUSALEM WERT MRN: [id number] Date of Birth: [birth year]   Medicare Observation Status Notification Given:  Yes    Tieara Flitton LILLETTE Fenton, LCSW 09/24/2024, 10:43 AM

## 2024-09-24 NOTE — Consult Note (Signed)
 Urology Consult Note   Requesting Attending Physician:  Caleen Burgess BROCKS, MD Service Providing Consult: Urology  Consulting Attending: Dr. Sherrilee   Reason for Consult: Gross hematuria  HPI: Stanley Waters is seen in consultation for reasons noted above at the request of Caleen Burgess BROCKS, MD. Patient is a 89 y.o. male presenting to [facility] emergency department with suprapubic pain, hematuria, dysuria, and gross blood via penis.  S/p prostatic artery embolization with IR on 09/20/2024.  Patient is known to our practice and followed by Dr. Watt where he has been followed for nocturia, frequency, and urgency.  Imaging and cystoscopy have revealed severe prostatic hyperplasia, around 150 mL with an enlarged obstructing median lobe and moderate bladder trabeculation.  On arrival patient was alert, oriented, and in no distress.  He reported severe pain during catheter placement prior to his PAE with bleeding afterwards, which she reports doing since.  He began taking his Xarelto  yesterday which has exacerbated his bleeding.  We were accompanied by interventional radiology and his son during patient exam.  Interval 11/22: Pt AFVSS. Urine clear yellow this morning. Myrbetriq  and Levsin  ordered.   Hgb stable at 13.9.  ------------------  Assessment:   89 y.o. male with dysuria, suprapubic pain, and gross hematuria following prostatic artery embolization 2 days ago.  Followed by Dr. Watt.   Recommendations: # Gross hematuria # Suprapubic pain # Dysuria  Continue foley catheter to drainage   Scheduled Myrbetriq  and as needed Levsin  for spasm.  Would like to washout Xarelto  and refrain from restarting until at least 48 hours free of bleeding.  Risk stratification per cardiology  Hand irrigate as needed.  Urology will follow  Case and plan discussed with Dr. Sherrilee  Past Medical History: Past Medical History:  Diagnosis Date   Atrial flutter (HCC)    Atypical chest pain     Myoview performed 07/23/11 was completely normal. Post stress EF 61%.   Bruit    Left asymptomatic bruit. Carotid Duplex 12/13/12 =mildly abnormal. *BILATERAL BULB/PROXIMAL ICAs: Demonstrated a mild amount of fibrous plaque w/no evidence od significant diameter reduction, tortuosity or other vascular abnormality.   Enlarged prostate    BPH - PSA of 6.7 this is consistant with his last PSA of 6.3   Hyperlipemia    Hypertension    Inguinal hernia    Inguinal hernia repair (x) 2 1991, 2011   Intestinal polyps 09/22/11   Colonoscopy removed a 2 mm sessile cecal polyp with a cold snare.   Measles    Mumps    Normal coronary arteries 2002   Peripheral neuropathy    Persistent atrial fibrillation (HCC)    Seasonal allergies    Skin cancer    Thrombocytopenia 2011   Very mild with platelets of 135,000.   Tinnitus    Umbilical hernia    Vitamin D  deficiency     Past Surgical History:  Past Surgical History:  Procedure Laterality Date   ATRIAL FIBRILLATION ABLATION  09/20/2018   ATRIAL FIBRILLATION ABLATION N/A 09/20/2018   Procedure: ATRIAL FIBRILLATION ABLATION;  Surgeon: Kelsie Agent, MD;  Location: [facility] INVASIVE CV LAB;  Service: Cardiovascular;  Laterality: N/A;   CARDIAC CATHETERIZATION  2002   Normal cardiac catheterization   CARDIOVERSION  07/07/2012   A fib - Cardioversion successful.   CARDIOVERSION N/A 06/30/2013   Procedure: CARDIOVERSION;  Surgeon: Jerel Balding, MD;  Location: [facility] OR;  Service: Cardiovascular;  Laterality: N/A;   CARDIOVERSION N/A 08/02/2018   Procedure: CARDIOVERSION;  Surgeon: Pietro Redell RAMAN, MD;  Location: [facility] ENDOSCOPY;  Service: Cardiovascular;  Laterality: N/A;   Carotid Duplex  12/13/12   For asymptomatic bruit. Mildly abnormal.  *BILATERAL BULB/PROXIMAL ICAs: Demonstrated a mild amount of fibrous plaque w/no evidence of significant diameter reduction, tortuosity or other vascular abnormality.    COLONOSCOPY W/ POLYPECTOMY  09/22/11   2 mm sessile  cecal polyp was removed with a cold snare.   HAND SURGERY     INGUINAL HERNIA REPAIR  1991 & 2011   IR 3D INDEPENDENT WKST  09/20/2024   IR ANGIOGRAM PELVIS SELECTIVE OR SUPRASELECTIVE  09/20/2024   IR ANGIOGRAM SELECTIVE EACH ADDITIONAL VESSEL  09/20/2024   IR ANGIOGRAM SELECTIVE EACH ADDITIONAL VESSEL  09/20/2024   IR ANGIOGRAM SELECTIVE EACH ADDITIONAL VESSEL  09/20/2024   IR EMBO TUMOR ORGAN ISCHEMIA INFARCT INC GUIDE ROADMAPPING  09/20/2024   IR US  GUIDE VASC ACCESS LEFT  09/20/2024   IR US  GUIDE VASC ACCESS RIGHT  09/20/2024   TEE WITHOUT CARDIOVERSION  07/07/2012   Procedure: TRANSESOPHAGEAL ECHOCARDIOGRAM (TEE);  Surgeon: Vinie KYM Maxcy, MD;  Location: [facility] ENDOSCOPY;  Service: Cardiovascular;  Laterality: N/A;   TEE WITHOUT CARDIOVERSION N/A 08/02/2018   Procedure: TRANSESOPHAGEAL ECHOCARDIOGRAM (TEE);  Surgeon: Pietro Redell RAMAN, MD;  Location: [facility] ENDOSCOPY;  Service: Cardiovascular;  Laterality: N/A;    Medication: Current Facility-Administered Medications  Medication Dose Route Frequency Provider Last Rate Last Admin   acetaminophen  (TYLENOL ) tablet 650 mg  650 mg Oral Q6H PRN Laurita Cort DASEN, MD       Or   acetaminophen  (TYLENOL ) suppository 650 mg  650 mg Rectal Q6H PRN Laurita Cort DASEN, MD       cefTRIAXone  (ROCEPHIN ) 1 g in sodium chloride  0.9 % 100 mL IVPB  1 g Intravenous Q24H Laurita Cort DASEN, MD   Stopped at 09/23/24 1310   Chlorhexidine  Gluconate Cloth 2 % PADS 6 each  6 each Topical Daily Laurita Cort DASEN, MD   6 each at 09/23/24 9176   dofetilide  (TIKOSYN ) capsule 250 mcg  250 mcg Oral BID Laurita Cort T, MD   250 mcg at 09/24/24 0913   doxazosin  (CARDURA ) tablet 8 mg  8 mg Oral QHS Laurita Cort T, MD   8 mg at 09/23/24 2150   ezetimibe  (ZETIA ) tablet 10 mg  10 mg Oral Daily Zhang, Ping T, MD   10 mg at 09/24/24 1105   furosemide  (LASIX ) tablet 20 mg  20 mg Oral Daily Zhang, Ping T, MD   20 mg at 09/24/24 1106   glucagon  (human recombinant) (GLUCAGEN) injection 1 mg  1 mg  Intravenous PRN Amin, Ankit C, MD       hydrALAZINE  (APRESOLINE ) injection 10 mg  10 mg Intravenous Q4H PRN Amin, Ankit C, MD       HYDROmorphone  (DILAUDID ) injection 0.5-1 mg  0.5-1 mg Intravenous Q2H PRN Laurita Cort T, MD   1 mg at 09/24/24 1115   hyoscyamine  (LEVSIN  SL) SL tablet 0.125 mg  0.125 mg Oral Q4H PRN Sherrilee Belvie CROME, MD   0.125 mg at 09/23/24 1700   ipratropium-albuterol  (DUONEB) 0.5-2.5 (3) MG/3ML nebulizer solution 3 mL  3 mL Nebulization Q4H PRN Amin, Ankit C, MD       lisinopril  (ZESTRIL ) tablet 20 mg  20 mg Oral Daily PRN Laurita Cort DASEN, MD       mirabegron  ER (MYRBETRIQ ) tablet 50 mg  50 mg Oral Daily Sherrilee Belvie CROME, MD   50 mg at 09/24/24 1125  ondansetron  (ZOFRAN ) injection 4 mg  4 mg Intravenous Q6H PRN Amin, Ankit C, MD   4 mg at 09/23/24 2209   oxyCODONE -acetaminophen  (PERCOCET/ROXICET) 5-325 MG per tablet 1 tablet  1 tablet Oral Q6H PRN Laurita Cort DASEN, MD   1 tablet at 09/23/24 1917   polyethylene glycol (MIRALAX  / GLYCOLAX ) packet 17 g  17 g Oral QHS Laurita Cort T, MD   17 g at 09/23/24 2149   rOPINIRole  (REQUIP ) tablet 0.5 mg  0.5 mg Oral QHS Laurita Cort T, MD   0.5 mg at 09/23/24 2149   senna-docusate (Senokot-S) tablet 1 tablet  1 tablet Oral QHS PRN Amin, Ankit C, MD   1 tablet at 09/23/24 1302    Allergies: Allergies  Allergen Reactions   Prednisone  Swelling, Other (See Comments) and Hypertension    Tachycardia, also   Trazodone  Hcl Other (See Comments)   Doxycycline Rash    Social History: Social History   Tobacco Use   Smoking status: Former    Current packs/day: 0.00    Types: Cigarettes    Quit date: 11/03/1963    Years since quitting: 60.9   Smokeless tobacco: Never   Tobacco comments:    Former smoker 12/21/22  Vaping Use   Vaping status: Never Used  Substance Use Topics   Alcohol use: No   Drug use: No    Family History Family History  Problem Relation Age of Onset   Heart failure Brother     ROS   Objective   Vital  signs in last 24 hours: BP (!) 112/58 (BP Location: Right Arm)   Pulse 79   Temp 99.7 F (37.6 C) (Oral)   Resp 14   Ht 6' (1.829 m)   Wt 92.9 kg   SpO2 95%   BMI 27.78 kg/m   Physical Exam General: A&O, resting, appropriate HEENT: [place]/AT Pulmonary: Normal work of breathing Cardiovascular: no cyanosis GU: Foley in place, urine is clear yellow   Most Recent Labs: Lab Results  Component Value Date   WBC 9.1 09/24/2024   HGB 13.9 09/24/2024   HCT 40.4 09/24/2024   PLT 130 (L) 09/24/2024    Lab Results  Component Value Date   NA 133 (L) 09/24/2024   K 3.8 09/24/2024   CL 98 09/24/2024   CO2 25 09/24/2024   BUN 13 09/24/2024   CREATININE 0.88 09/24/2024   CALCIUM  8.3 (L) 09/24/2024   MG 2.3 09/24/2024    Lab Results  Component Value Date   INR 1.2 09/20/2024     Urine Culture: @[id number] (laburin,org,[id number],[id number])@   IMAGING: DG Abd 1 View Result Date: 09/24/2024 EXAM: 1 VIEW XRAY OF THE ABDOMEN 09/24/2024 10:08:00 AM COMPARISON: None available. CLINICAL HISTORY: Abdominal distention; Vomiting. FINDINGS: BOWEL: Mild gaseous distension of the stomach. There are several loops of small bowel within the left lower quadrant of the abdomen which measure up to 3.2 cm. The colon is nondilated . SOFT TISSUES: Left inguinal hernia repair with mesh noted. Aortoiliac calcified atherosclerosis. No opaque urinary calculi. BONES: Curvature of the lumbar spine is convex towards the left. No acute osseous abnormality. IMPRESSION: 1. Findings suggest ileus versus partial small bowel obstruction with distention of the stomach and left lower quadrant small bowel loops. Electronically signed by: Waddell Calk MD 09/24/2024 10:31 AM EST RP Workstation: [id number]   CT HEMATURIA WORKUP Result Date: 09/22/2024 CLINICAL DATA:  Hematuria. History of benign prostatic hypertrophy status post left prostate artery embolization 09/20/2024 EXAM: CT ABDOMEN AND PELVIS WITHOUT AND  WITH CONTRAST  TECHNIQUE: Multidetector CT imaging of the abdomen and pelvis was performed following the standard protocol before and following the bolus administration of intravenous contrast. RADIATION DOSE REDUCTION: This exam was performed according to the departmental dose-optimization program which includes automated exposure control, adjustment of the mA and/or kV according to patient size and/or use of iterative reconstruction technique. CONTRAST:  OMNIPAQUE  IOHEXOL  300 MG/ML  SOLN COMPARISON:  CT abdomen dated 12/10/2022, CTA pelvis dated 08/10/2024 FINDINGS: Lower chest: Bibasilar dendriform ossification. No pleural effusion or pneumothorax demonstrated. Partially imaged multichamber cardiomegaly. Hepatobiliary: No focal hepatic lesions. No intra or extrahepatic biliary ductal dilation. Cholelithiasis. Pancreas: No focal lesions or main ductal dilation. Spleen: Nonenhancing 2.7 cm splenic hypodensity (3:33), unchanged from 12/10/2022, likely benign. Mildly enlarged spleen measures 14.6 cm, previously 13.8 cm on 12/10/2022. Additional subcentimeter hypodensity, too small to characterize, but also likely cyst. Adrenals/Urinary Tract: No adrenal nodules. Duplex right kidney. No suspicious renal mass, calculi or hydronephrosis. 1.7 cm simple right parapelvic cyst (12:33). No specific follow-up imaging recommended. Segmental mild right ureteral narrowing, which may be related to peristalsis. No definite filling defects. Mildly trabeculated urinary bladder. Stomach/Bowel: Normal appearance of the stomach. No evidence of bowel wall thickening, distention, or inflammatory changes. Colonic diverticulosis without acute diverticulitis. Normal appendix. Vascular/Lymphatic: Aortic atherosclerosis. No enlarged abdominal or pelvic lymph nodes. Reproductive: Markedly enlarged prostate gland. 1.5 cm rounded hyperdensity in the left prostatic base (3:76), new compared to 08/10/2024, likely hemorrhage. Other: No free fluid, fluid  collection, or free air. Musculoskeletal: No acute or abnormal lytic or blastic osseous lesions. Multilevel degenerative changes of the partially imaged thoracic and lumbar spine. Small fat-containing right inguinal hernia. Postsurgical changes of left anterior lower abdominal wall mesh hernia repair. Postsurgical changes of right inguinal region. IMPRESSION: 1. Markedly enlarged prostate gland with 1.5 cm rounded hyperdensity in the left prostatic base, new compared to 08/10/2024, likely hemorrhage in the setting of recent left prostate artery embolization. 2. No urolithiasis, filling defects, or urinary tract dilation. No suspicious renal or urothelial lesions. 3. Increased mild splenomegaly. 4.  Aortic Atherosclerosis (ICD10-I70.0). Electronically Signed   By: Limin  Xu M.D.   On: 09/22/2024 12:28

## 2024-09-24 NOTE — Care Management Obs Status (Signed)
 MEDICARE OBSERVATION STATUS NOTIFICATION   Patient Details  Name: Stanley Waters MRN: [id number] Date of Birth: [birth year]   Medicare Observation Status Notification Given:  Yes    Tieara Flitton LILLETTE Fenton, LCSW 09/24/2024, 10:43 AM

## 2024-09-24 NOTE — Progress Notes (Signed)
 PT Cancellation Note  Patient Details Name: Stanley Waters MRN: [id number] DOB: 28-Mar-1935   Cancelled Treatment:     PT order received and eval attempted x 2 but deferred in am 2* pt nausea and in pm with pt in transport for CT.  WIll follow.   Aniaya Bacha 09/24/2024, 4:31 PM

## 2024-09-24 NOTE — Plan of Care (Signed)

## 2024-09-24 NOTE — Progress Notes (Signed)
 PROGRESS NOTE    Stanley Waters  FMW:[id number] DOB: 09-03-1935 DOA: 09/22/2024 PCP: Clarice Nottingham, MD    Brief Narrative:  89 year old with history of severe BPH with severe urethral stenosis status post left-sided prostate artery embolization 09/20/2024, P A-fib status post cardioversion on Tikosyn  and Xarelto , HTN, HLD, peripheral neuropathy admitted for persistent hematuria.  CT abdomen pelvis showed markedly enlarged prostate.  Urology was consulted who placed a Foley catheter in.  Empirically getting IV Rocephin , Xarelto  is on hold.  Slowly this appears to be clearing up Started reporting of nausea and vomiting with abnormal abdominal x-ray therefore CT scan ordered.   Assessment & Plan:  Acute urinary retention with recurrent hematuria Severe BPH status post left prostate artery embolization 11/19 - Seen by urology, Foley catheter placed.  Currently anticoagulation is on hold.  Closely monitor hemoglobin levels. -Continue empiric IV Rocephin  - On Cardura   Nausea vomiting - X-ray suggestive of partial small bowel obstruction versus ileus.  Will obtain CT scan, hopefully he will tolerate p.o. contrast  Paroxysmal atrial fibrillation - Currently in sinus rhythm on Tikosyn .  Holding Xarelto   Essential hypertension Congestive heart failure with preserved ejection fraction - Currently on daily Lasix .  Will closely monitor volume levels. -IV as needed   DVT prophylaxis: SCDs Start: 09/22/24 1507    Code Status: Full Code Family Communication:   Continue hospital stay until improvement in his hematuria and urology clearance. PT Follow up Recs:   Subjective:  Patient reporting of abdominal distention and nausea this morning.  Poor oral intake.  Examination:  General exam: Appears calm and comfortable  Respiratory system: Clear to auscultation. Respiratory effort normal. Cardiovascular system: S1 & S2 heard, RRR. No JVD, murmurs, rubs, gallops or clicks. No pedal  edema. Gastrointestinal system: Abdominal distention, diminished bowel sounds. Central nervous system: Alert and oriented. No focal neurological deficits. Extremities: Symmetric 5 x 5 power. Skin: No rashes, lesions or ulcers Psychiatry: Judgement and insight appear normal. Mood & affect appropriate. Foley in place with some hematuria               Diet Orders (From admission, onward)     Start     Ordered   09/22/24 1509  Diet Heart Room service appropriate? Yes; Fluid consistency: Thin  Diet effective now       Question Answer Comment  Room service appropriate? Yes   Fluid consistency: Thin      09/22/24 1509            Objective: Vitals:   09/23/24 0604 09/23/24 1501 09/23/24 2116 09/24/24 0515  BP:  131/67 (!) 144/83 (!) 112/58  Pulse:  72 75 79  Resp:  16 14 14   Temp:  99.3 F (37.4 C) 99.8 F (37.7 C) 99.7 F (37.6 C)  TempSrc:  Oral Oral Oral  SpO2:  98% 95% 95%  Weight: 92.9 kg     Height: 6' (1.829 m)       Intake/Output Summary (Last 24 hours) at 09/24/2024 1112 Last data filed at 09/24/2024 0526 Gross per 24 hour  Intake 100 ml  Output 800 ml  Net -700 ml   Filed Weights   09/23/24 0604  Weight: 92.9 kg    Scheduled Meds:  Chlorhexidine  Gluconate Cloth  6 each Topical Daily   dofetilide   250 mcg Oral BID   doxazosin   8 mg Oral QHS   ezetimibe   10 mg Oral Daily   furosemide   20 mg Oral Daily   mirabegron  ER  50 mg Oral Daily   polyethylene glycol  17 g Oral QHS   rOPINIRole   0.5 mg Oral QHS   Continuous Infusions:  cefTRIAXone  (ROCEPHIN )  IV Stopped (09/23/24 1310)    Nutritional status     Body mass index is 27.78 kg/m.  Data Reviewed:   CBC: Recent Labs  Lab 09/20/24 0945 09/22/24 0756 09/23/24 0727 09/24/24 0703  WBC 4.6 10.4 10.4 9.1  NEUTROABS 3.5 9.1*  --   --   HGB 15.1 14.3 13.9 13.9  HCT 44.0 42.1 40.9 40.4  MCV 92.6 94.2 94.7 92.9  PLT 125* 108* 112* 130*   Basic Metabolic Panel: Recent Labs  Lab  09/20/24 0945 09/22/24 0756 09/24/24 0703  NA 138 132* 133*  K 4.1 4.2 3.8  CL 102 99 98  CO2 27 24 25   GLUCOSE 111* 142* 127*  BUN 14 15 13   CREATININE 0.92 1.22 0.88  CALCIUM  9.0 8.9 8.3*  MG  --   --  2.3   GFR: Estimated Creatinine Clearance: 64.9 mL/min (by C-G formula based on SCr of 0.88 mg/dL). Liver Function Tests: Recent Labs  Lab 09/20/24 0945 09/22/24 0756  AST 18 26  ALT 11 11  ALKPHOS 65 62  BILITOT 0.9 1.4*  PROT 7.2 6.9  ALBUMIN 4.1 4.0   No results for input(s): LIPASE, AMYLASE in the last 168 hours. No results for input(s): AMMONIA in the last 168 hours. Coagulation Profile: Recent Labs  Lab 09/20/24 0945  INR 1.2   Cardiac Enzymes: No results for input(s): CKTOTAL, CKMB, CKMBINDEX, TROPONINI in the last 168 hours. BNP (last 3 results) No results for input(s): PROBNP in the last 8760 hours. HbA1C: No results for input(s): HGBA1C in the last 72 hours. CBG: No results for input(s): GLUCAP in the last 168 hours. Lipid Profile: No results for input(s): CHOL, HDL, LDLCALC, TRIG, CHOLHDL, LDLDIRECT in the last 72 hours. Thyroid  Function Tests: No results for input(s): TSH, T4TOTAL, FREET4, T3FREE, THYROIDAB in the last 72 hours. Anemia Panel: No results for input(s): VITAMINB12, FOLATE, FERRITIN, TIBC, IRON, RETICCTPCT in the last 72 hours. Sepsis Labs: No results for input(s): PROCALCITON, LATICACIDVEN in the last 168 hours.  No results found for this or any previous visit (from the past 240 hours).       Radiology Studies: DG Abd 1 View Result Date: 09/24/2024 EXAM: 1 VIEW XRAY OF THE ABDOMEN 09/24/2024 10:08:00 AM COMPARISON: None available. CLINICAL HISTORY: Abdominal distention; Vomiting. FINDINGS: BOWEL: Mild gaseous distension of the stomach. There are several loops of small bowel within the left lower quadrant of the abdomen which measure up to 3.2 cm. The colon is nondilated .  SOFT TISSUES: Left inguinal hernia repair with mesh noted. Aortoiliac calcified atherosclerosis. No opaque urinary calculi. BONES: Curvature of the lumbar spine is convex towards the left. No acute osseous abnormality. IMPRESSION: 1. Findings suggest ileus versus partial small bowel obstruction with distention of the stomach and left lower quadrant small bowel loops. Electronically signed by: Waddell Calk MD 09/24/2024 10:31 AM EST RP Workstation: [id number]   CT HEMATURIA WORKUP Result Date: 09/22/2024 CLINICAL DATA:  Hematuria. History of benign prostatic hypertrophy status post left prostate artery embolization 09/20/2024 EXAM: CT ABDOMEN AND PELVIS WITHOUT AND WITH CONTRAST TECHNIQUE: Multidetector CT imaging of the abdomen and pelvis was performed following the standard protocol before and following the bolus administration of intravenous contrast. RADIATION DOSE REDUCTION: This exam was performed according to the departmental dose-optimization program which includes automated exposure control, adjustment of  the mA and/or kV according to patient size and/or use of iterative reconstruction technique. CONTRAST:  OMNIPAQUE  IOHEXOL  300 MG/ML  SOLN COMPARISON:  CT abdomen dated 12/10/2022, CTA pelvis dated 08/10/2024 FINDINGS: Lower chest: Bibasilar dendriform ossification. No pleural effusion or pneumothorax demonstrated. Partially imaged multichamber cardiomegaly. Hepatobiliary: No focal hepatic lesions. No intra or extrahepatic biliary ductal dilation. Cholelithiasis. Pancreas: No focal lesions or main ductal dilation. Spleen: Nonenhancing 2.7 cm splenic hypodensity (3:33), unchanged from 12/10/2022, likely benign. Mildly enlarged spleen measures 14.6 cm, previously 13.8 cm on 12/10/2022. Additional subcentimeter hypodensity, too small to characterize, but also likely cyst. Adrenals/Urinary Tract: No adrenal nodules. Duplex right kidney. No suspicious renal mass, calculi or hydronephrosis. 1.7 cm  simple right parapelvic cyst (12:33). No specific follow-up imaging recommended. Segmental mild right ureteral narrowing, which may be related to peristalsis. No definite filling defects. Mildly trabeculated urinary bladder. Stomach/Bowel: Normal appearance of the stomach. No evidence of bowel wall thickening, distention, or inflammatory changes. Colonic diverticulosis without acute diverticulitis. Normal appendix. Vascular/Lymphatic: Aortic atherosclerosis. No enlarged abdominal or pelvic lymph nodes. Reproductive: Markedly enlarged prostate gland. 1.5 cm rounded hyperdensity in the left prostatic base (3:76), new compared to 08/10/2024, likely hemorrhage. Other: No free fluid, fluid collection, or free air. Musculoskeletal: No acute or abnormal lytic or blastic osseous lesions. Multilevel degenerative changes of the partially imaged thoracic and lumbar spine. Small fat-containing right inguinal hernia. Postsurgical changes of left anterior lower abdominal wall mesh hernia repair. Postsurgical changes of right inguinal region. IMPRESSION: 1. Markedly enlarged prostate gland with 1.5 cm rounded hyperdensity in the left prostatic base, new compared to 08/10/2024, likely hemorrhage in the setting of recent left prostate artery embolization. 2. No urolithiasis, filling defects, or urinary tract dilation. No suspicious renal or urothelial lesions. 3. Increased mild splenomegaly. 4.  Aortic Atherosclerosis (ICD10-I70.0). Electronically Signed   By: Limin  Xu M.D.   On: 09/22/2024 12:28           LOS: 0 days   Time spent= 35 mins    Burgess JAYSON Dare, MD [facility]  If 7PM-7AM, please contact night-coverage  09/24/2024, 11:12 AM

## 2024-09-24 NOTE — Plan of Care (Signed)
  Problem: Health Behavior/Discharge Planning: Goal: Ability to manage health-related needs will improve Outcome: Progressing   Problem: Clinical Measurements: Goal: Cardiovascular complication will be avoided Outcome: Progressing   Problem: Education: Goal: Knowledge of General Education information will improve Description: Including pain rating scale, medication(s)/side effects and non-pharmacologic comfort measures Outcome: Not Progressing   Problem: Clinical Measurements: Goal: Ability to maintain clinical measurements within normal limits will improve Outcome: Not Progressing Goal: Will remain free from infection Outcome: Not Progressing Goal: Diagnostic test results will improve Outcome: Not Progressing   Problem: Activity: Goal: Risk for activity intolerance will decrease Outcome: Not Progressing   Problem: Nutrition: Goal: Adequate nutrition will be maintained Outcome: Not Progressing

## 2024-09-24 NOTE — Progress Notes (Signed)
 OT Cancellation Note  Patient Details Name: Stanley Waters MRN: [id number] DOB: 05-Mar-1935   Cancelled Treatment:    Reason Eval/Treat Not Completed: Patient at procedure or test/ unavailable Will check on pt next day  DOTTIE Norvel BIRCH 09/24/2024, 2:39 PM

## 2024-09-24 NOTE — Plan of Care (Signed)
  Problem: Clinical Measurements: Goal: Ability to maintain clinical measurements within normal limits will improve Outcome: Progressing Goal: Will remain free from infection Outcome: Progressing Goal: Diagnostic test results will improve Outcome: Progressing   Problem: Activity: Goal: Risk for activity intolerance will decrease Outcome: Progressing   Problem: Pain Managment: Goal: General experience of comfort will improve and/or be controlled Outcome: Progressing   Problem: Safety: Goal: Ability to remain free from injury will improve Outcome: Progressing   Problem: Skin Integrity: Goal: Risk for impaired skin integrity will decrease Outcome: Progressing

## 2024-09-25 ENCOUNTER — Inpatient Hospital Stay: Admitting: Hematology

## 2024-09-25 ENCOUNTER — Other Ambulatory Visit (HOSPITAL_COMMUNITY): Payer: Self-pay

## 2024-09-25 ENCOUNTER — Inpatient Hospital Stay

## 2024-09-25 DIAGNOSIS — R338 Other retention of urine: Secondary | ICD-10-CM | POA: Diagnosis present

## 2024-09-25 DIAGNOSIS — R140 Abdominal distension (gaseous): Secondary | ICD-10-CM | POA: Diagnosis present

## 2024-09-25 DIAGNOSIS — R339 Retention of urine, unspecified: Secondary | ICD-10-CM

## 2024-09-25 DIAGNOSIS — N3289 Other specified disorders of bladder: Secondary | ICD-10-CM | POA: Diagnosis present

## 2024-09-25 DIAGNOSIS — Z87891 Personal history of nicotine dependence: Secondary | ICD-10-CM | POA: Diagnosis not present

## 2024-09-25 DIAGNOSIS — Z881 Allergy status to other antibiotic agents status: Secondary | ICD-10-CM | POA: Diagnosis not present

## 2024-09-25 DIAGNOSIS — Y842 Radiological procedure and radiotherapy as the cause of abnormal reaction of the patient, or of later complication, without mention of misadventure at the time of the procedure: Secondary | ICD-10-CM | POA: Diagnosis present

## 2024-09-25 DIAGNOSIS — E785 Hyperlipidemia, unspecified: Secondary | ICD-10-CM | POA: Diagnosis present

## 2024-09-25 DIAGNOSIS — I700 Atherosclerosis of aorta: Secondary | ICD-10-CM | POA: Diagnosis present

## 2024-09-25 DIAGNOSIS — I110 Hypertensive heart disease with heart failure: Secondary | ICD-10-CM | POA: Diagnosis present

## 2024-09-25 DIAGNOSIS — I4819 Other persistent atrial fibrillation: Secondary | ICD-10-CM | POA: Diagnosis present

## 2024-09-25 DIAGNOSIS — Z79899 Other long term (current) drug therapy: Secondary | ICD-10-CM | POA: Diagnosis not present

## 2024-09-25 DIAGNOSIS — N401 Enlarged prostate with lower urinary tract symptoms: Secondary | ICD-10-CM | POA: Diagnosis present

## 2024-09-25 DIAGNOSIS — Z85828 Personal history of other malignant neoplasm of skin: Secondary | ICD-10-CM | POA: Diagnosis not present

## 2024-09-25 DIAGNOSIS — Z7901 Long term (current) use of anticoagulants: Secondary | ICD-10-CM | POA: Diagnosis not present

## 2024-09-25 DIAGNOSIS — Z860100 Personal history of colon polyps, unspecified: Secondary | ICD-10-CM | POA: Diagnosis not present

## 2024-09-25 DIAGNOSIS — Z8249 Family history of ischemic heart disease and other diseases of the circulatory system: Secondary | ICD-10-CM | POA: Diagnosis not present

## 2024-09-25 DIAGNOSIS — R112 Nausea with vomiting, unspecified: Secondary | ICD-10-CM | POA: Diagnosis present

## 2024-09-25 DIAGNOSIS — Z885 Allergy status to narcotic agent status: Secondary | ICD-10-CM | POA: Diagnosis not present

## 2024-09-25 DIAGNOSIS — I5032 Chronic diastolic (congestive) heart failure: Secondary | ICD-10-CM | POA: Diagnosis present

## 2024-09-25 DIAGNOSIS — R310 Gross hematuria: Secondary | ICD-10-CM | POA: Diagnosis present

## 2024-09-25 DIAGNOSIS — D6832 Hemorrhagic disorder due to extrinsic circulating anticoagulants: Secondary | ICD-10-CM | POA: Diagnosis present

## 2024-09-25 DIAGNOSIS — Z7982 Long term (current) use of aspirin: Secondary | ICD-10-CM | POA: Diagnosis not present

## 2024-09-25 LAB — MAGNESIUM: Magnesium: 2.5 mg/dL — ABNORMAL HIGH (ref 1.7–2.4)

## 2024-09-25 LAB — BASIC METABOLIC PANEL WITH GFR
Anion gap: 9 (ref 5–15)
BUN: 15 mg/dL (ref 8–23)
CO2: 25 mmol/L (ref 22–32)
Calcium: 8.5 mg/dL — ABNORMAL LOW (ref 8.9–10.3)
Chloride: 98 mmol/L (ref 98–111)
Creatinine, Ser: 0.93 mg/dL (ref 0.61–1.24)
GFR, Estimated: >60 mL/min (ref >60)
Glucose, Bld: 123 mg/dL — ABNORMAL HIGH (ref 70–99)
Potassium: 3.9 mmol/L (ref 3.5–5.1)
Sodium: 132 mmol/L — ABNORMAL LOW (ref 135–145)

## 2024-09-25 LAB — CBC
HCT: 39 % (ref 39.0–52.0)
Hemoglobin: 13.1 g/dL (ref 13.0–17.0)
MCH: 31.7 pg (ref 26.0–34.0)
MCHC: 33.6 g/dL (ref 30.0–36.0)
MCV: 94.4 fL (ref 80.0–100.0)
Platelets: 152 K/uL (ref 150–400)
RBC: 4.13 MIL/uL — ABNORMAL LOW (ref 4.22–5.81)
RDW: 12.4 % (ref 11.5–15.5)
WBC: 6.9 K/uL (ref 4.0–10.5)
nRBC: 0 % (ref 0.0–0.2)

## 2024-09-25 MED ORDER — CIPROFLOXACIN HCL 500 MG PO TABS
500.0000 mg | ORAL_TABLET | Freq: Two times a day (BID) | ORAL | 0 refills | Status: DC
  Filled 2024-09-25: qty 8, 4d supply, fill #0

## 2024-09-25 MED ORDER — CIPROFLOXACIN HCL 500 MG PO TABS: 500.0000 mg | ORAL_TABLET | Freq: Two times a day (BID) | ORAL | 0 refills | Status: AC

## 2024-09-25 MED ORDER — POTASSIUM CHLORIDE CRYS ER 20 MEQ PO TBCR
40.0000 meq | EXTENDED_RELEASE_TABLET | Freq: Once | ORAL | Status: AC
  Administered 2024-09-25: 40 meq via ORAL
  Filled 2024-09-25: qty 2

## 2024-09-25 MED ORDER — MIRABEGRON ER 50 MG PO TB24
50.0000 mg | ORAL_TABLET | Freq: Every day | ORAL | 0 refills | Status: AC
  Filled 2024-09-25: qty 30, 30d supply, fill #0

## 2024-09-25 NOTE — Evaluation (Signed)
 Occupational Therapy Evaluation Patient Details Name: Stanley Waters MRN: [id number] DOB: October 20, 1935 Today's Date: 09/25/2024   History of Present Illness   Stanley Waters is a 89 y.o. male presented with worsening hematuria, blood clots in urine and acute urinary retention. Of note, s/p left side prostate artery embolization on 09/20/24. PMH: severe BPH with severe urethral stenosis, PAF s/p cardioversion, HTN, HLD, peripheral neuropathy     Clinical Impressions Pt c/o significant pain at due to foley and on bottom. Pt lives alone, PLOF independent with no AD. Pt currently limited due to pain, able to ambulate with supervision for safety using RW, overall mod I for ADLs but needs help with socks/shoes due to pain at foley. Pt believes once foley is out he will return to baseline of mod I. Pt would benefit from continued acute OT to maximize independence with ADLs and return to PLOF, no OT follow up expected, rollator for return home recommended to maximize safety and independence with ADLs/mobility.      If plan is discharge home, recommend the following:         Functional Status Assessment   Patient has had a recent decline in their functional status and demonstrates the ability to make significant improvements in function in a reasonable and predictable amount of time.     Equipment Recommendations   Other (comment) (rollator)     Recommendations for Other Services         Precautions/Restrictions   Precautions Precautions: Fall Recall of Precautions/Restrictions: Intact Precaution/Restrictions Comments: severe penis pain, be mindful of Foley Restrictions Weight Bearing Restrictions Per Provider Order: No     Mobility Bed Mobility               General bed mobility comments: in recliner    Transfers Overall transfer level: Needs assistance Equipment used: Rolling walker (2 wheels) Transfers: Sit to/from Stand Sit to Stand: Supervision            General transfer comment: supervision for safety      Balance Overall balance assessment: Modified Independent                                         ADL either performed or assessed with clinical judgement   ADL Overall ADL's : Needs assistance/impaired                                       General ADL Comments: overall mod I but due to pain with foley Pt needs assistance for don/doff socks/shoes. Pt has slip on shoes at home and hopes it'll get easier once pain from foley is improved.     Vision Baseline Vision/History: 1 Wears glasses Ability to See in Adequate Light: 0 Adequate Patient Visual Report: No change from baseline       Perception         Praxis         Pertinent Vitals/Pain Pain Assessment Pain Assessment: Faces Faces Pain Scale: Hurts even more Pain Location: penis Pain Descriptors / Indicators: Sore Pain Intervention(s): Monitored during session     Extremity/Trunk Assessment Upper Extremity Assessment Upper Extremity Assessment: Overall WFL for tasks assessed   Lower Extremity Assessment Lower Extremity Assessment: Defer to PT evaluation RLE Sensation: history of peripheral neuropathy;decreased light touch LLE Sensation:  decreased light touch;history of peripheral neuropathy   Cervical / Trunk Assessment Cervical / Trunk Assessment: Normal   Communication Communication Communication: Impaired Factors Affecting Communication: Hearing impaired   Cognition Arousal: Alert Behavior During Therapy: WFL for tasks assessed/performed Cognition: No apparent impairments                               Following commands: Intact       Cueing  General Comments   Cueing Techniques: Verbal cues      Exercises     Shoulder Instructions      Home Living Family/patient expects to be discharged to:: Private residence Living Arrangements: Alone Available Help at Discharge:  Neighbor;Available PRN/intermittently Type of Home: House Home Access: Level entry     Home Layout: One level     Bathroom Shower/Tub: Tub/shower unit         Home Equipment: None          Prior Functioning/Environment Prior Level of Function : Independent/Modified Independent;Driving             Mobility Comments: walks without AD, had 1 recent fall getting up in the night to use the bathroom and misjudged where the bed was and slipped off of bed with not injury ADLs Comments: independent    OT Problem List: Decreased activity tolerance;Pain   OT Treatment/Interventions: Self-care/ADL training;Therapeutic exercise;DME and/or AE instruction;Therapeutic activities      OT Goals(Current goals can be found in the care plan section)   Acute Rehab OT Goals Patient Stated Goal: to manage pain OT Goal Formulation: With patient Time For Goal Achievement: 10/09/24 Potential to Achieve Goals: Good   OT Frequency:  Min 2X/week    Co-evaluation              AM-PAC OT 6 Clicks Daily Activity     Outcome Measure Help from another person eating meals?: None Help from another person taking care of personal grooming?: None Help from another person toileting, which includes using toliet, bedpan, or urinal?: None Help from another person bathing (including washing, rinsing, drying)?: None Help from another person to put on and taking off regular upper body clothing?: None Help from another person to put on and taking off regular lower body clothing?: A Little 6 Click Score: 23   End of Session Equipment Utilized During Treatment: Rolling walker (2 wheels) Nurse Communication: Mobility status  Activity Tolerance: Patient tolerated treatment well Patient left: in chair;with call bell/phone within reach;with family/visitor present  OT Visit Diagnosis: Pain;Other abnormalities of gait and mobility (R26.89)                Time: 8881-8865 OT Time Calculation (min):  16 min Charges:  OT General Charges $OT Visit: 1 Visit OT Evaluation $OT Eval Low Complexity: 1 Low  [address], OTR/L   Elouise JONELLE Bott 09/25/2024, 11:40 AM

## 2024-09-25 NOTE — TOC Transition Note (Addendum)
 Transition of Care [facility]) - Discharge Note   Patient Details  Name: Stanley Waters MRN: [id number] Date of Birth: 1935/05/03  [facility] CM/SW Contact:  Jon ONEIDA Anon, RN Phone Number: 09/25/2024, 12:33 PM   Clinical Narrative:    Pt is to discharge home. PT recommending Rollator to assist with ambulation in the home. Orders are in place for Rollator. Zach with [employer] received orders and will have Rollator delivered to pt room prior to discharge. There are no further IP CM needs at this time.  Addendum: Received a call for rep with [employer] stating pt would need to pay a 20% copay before Rollator can be delivered. Met with pt and he states he does not have a way to pay for it here at the hospital but will call [employer] once he is home and make the copayment. [employer] states after payment is received, Rollator will be delivered to pt home.   Final next level of care: Home/Self Care Barriers to Discharge: Barriers Resolved   Patient Goals and CMS Choice Patient states their goals for this hospitalization and ongoing recovery are:: To return home CMS Medicare.gov Compare Post Acute Care list provided to:: Patient Choice offered to / list presented to : Patient [place] ownership interest in [facility].provided to:: Patient    Discharge Placement                       Discharge Plan and Services Additional resources added to the After Visit Summary for                  DME Arranged: Walker rolling with seat DME Agency: [employer] Date DME Agency Contacted: 09/25/24 Time DME Agency Contacted: 1211 Representative spoke with at DME Agency: Darlyn with [employer] HH Arranged: NA HH Agency: NA        Social Drivers of Health (SDOH) Interventions SDOH Screenings   Food Insecurity: No Food Insecurity (09/22/2024)  Housing: Low Risk  (09/22/2024)  Transportation Needs: No Transportation Needs (09/22/2024)  Utilities: Not At Risk (09/22/2024)   Social Connections: Unknown (09/23/2024)  Tobacco Use: Medium Risk (09/23/2024)     Readmission Risk Interventions     No data to display

## 2024-09-25 NOTE — Progress Notes (Signed)
 Discharge medication delivered to patient at the bedside

## 2024-09-25 NOTE — Progress Notes (Signed)
 PROGRESS NOTE    Stanley Waters  FMW:[id number] DOB: November 13, 1934 DOA: 09/22/2024 PCP: Stanley Nottingham, MD    Brief Narrative:  89 year old with history of severe BPH with severe urethral stenosis status post left-sided prostate artery embolization 09/20/2024, P A-fib status post cardioversion on Tikosyn  and Xarelto , HTN, HLD, peripheral neuropathy admitted for persistent hematuria.  CT abdomen pelvis showed markedly enlarged prostate.  Urology was consulted who placed a Foley catheter in.  Empirically getting IV Rocephin , Xarelto  is on hold.  Slowly this appears to be clearing up Started reporting of nausea and vomiting with abnormal abdominal x-ray therefore CT scan ordered which was unremarkable   Assessment & Plan:  Acute urinary retention with recurrent hematuria Severe BPH status post left prostate artery embolization 11/19 - Seen by urology, Foley catheter placed.  Currently anticoagulation is on hold.  Closely monitor hemoglobin levels. -Continue empiric IV Rocephin  - On Cardura   Nausea vomiting - X-ray suggestive of partial small bowel obstruction versus ileus.  CT abdomen pelvis was unremarkable  Paroxysmal atrial fibrillation - Currently in sinus rhythm on Tikosyn .  Holding Xarelto   Essential hypertension Congestive heart failure with preserved ejection fraction - Currently on daily Lasix .  Will closely monitor volume levels. -IV as needed   DVT prophylaxis: SCDs Start: 09/22/24 1507    Code Status: Full Code Family Communication:   Continue hospital stay until improvement in his hematuria and urology clearance. PT Follow up Recs:   Subjective:  Sitting up in the chair, feels better.   Examination:  General exam: Appears calm and comfortable  Respiratory system: Clear to auscultation. Respiratory effort normal. Cardiovascular system: S1 & S2 heard, RRR. No JVD, murmurs, rubs, gallops or clicks. No pedal edema. Gastrointestinal system: Abdominal distention,  diminished bowel sounds. Central nervous system: Alert and oriented. No focal neurological deficits. Extremities: Symmetric 5 x 5 power. Skin: No rashes, lesions or ulcers Psychiatry: Judgement and insight appear normal. Mood & affect appropriate. Foley in place with some hematuria               Diet Orders (From admission, onward)     Start     Ordered   09/22/24 1509  Diet Heart Room service appropriate? Yes; Fluid consistency: Thin  Diet effective now       Question Answer Comment  Room service appropriate? Yes   Fluid consistency: Thin      09/22/24 1509            Objective: Vitals:   09/24/24 1253 09/24/24 2039 09/24/24 2044 09/25/24 0540  BP: (!) 140/63 (!) 141/72 (!) 141/72 120/70  Pulse: 74  73 72  Resp: 16  18 14   Temp: 97.9 F (36.6 C)  98.5 F (36.9 C) 97.9 F (36.6 C)  TempSrc: Oral  Oral Oral  SpO2: 94%  97% 97%  Weight:      Height:        Intake/Output Summary (Last 24 hours) at 09/25/2024 1122 Last data filed at 09/25/2024 0448 Gross per 24 hour  Intake 380 ml  Output 1350 ml  Net -970 ml   Filed Weights   09/23/24 0604  Weight: 92.9 kg    Scheduled Meds:  Chlorhexidine  Gluconate Cloth  6 each Topical Daily   dofetilide   250 mcg Oral BID   doxazosin   8 mg Oral QHS   ezetimibe   10 mg Oral Daily   furosemide   20 mg Oral Daily   mirabegron  ER  50 mg Oral Daily   polyethylene glycol  17 g Oral QHS   potassium chloride   40 mEq Oral Once   rOPINIRole   0.5 mg Oral QHS   Continuous Infusions:  cefTRIAXone  (ROCEPHIN )  IV 1 g (09/24/24 1749)    Nutritional status     Body mass index is 27.78 kg/m.  Data Reviewed:   CBC: Recent Labs  Lab 09/20/24 0945 09/22/24 0756 09/23/24 0727 09/24/24 0703 09/25/24 0551  WBC 4.6 10.4 10.4 9.1 6.9  NEUTROABS 3.5 9.1*  --   --   --   HGB 15.1 14.3 13.9 13.9 13.1  HCT 44.0 42.1 40.9 40.4 39.0  MCV 92.6 94.2 94.7 92.9 94.4  PLT 125* 108* 112* 130* 152   Basic Metabolic  Panel: Recent Labs  Lab 09/20/24 0945 09/22/24 0756 09/24/24 0703 09/25/24 0551  NA 138 132* 133* 132*  K 4.1 4.2 3.8 3.9  CL 102 99 98 98  CO2 27 24 25 25   GLUCOSE 111* 142* 127* 123*  BUN 14 15 13 15   CREATININE 0.92 1.22 0.88 0.93  CALCIUM  9.0 8.9 8.3* 8.5*  MG  --   --  2.3 2.5*   GFR: Estimated Creatinine Clearance: 61.4 mL/min (by C-G formula based on SCr of 0.93 mg/dL). Liver Function Tests: Recent Labs  Lab 09/20/24 0945 09/22/24 0756  AST 18 26  ALT 11 11  ALKPHOS 65 62  BILITOT 0.9 1.4*  PROT 7.2 6.9  ALBUMIN 4.1 4.0   No results for input(s): LIPASE, AMYLASE in the last 168 hours. No results for input(s): AMMONIA in the last 168 hours. Coagulation Profile: Recent Labs  Lab 09/20/24 0945  INR 1.2   Cardiac Enzymes: No results for input(s): CKTOTAL, CKMB, CKMBINDEX, TROPONINI in the last 168 hours. BNP (last 3 results) No results for input(s): PROBNP in the last 8760 hours. HbA1C: No results for input(s): HGBA1C in the last 72 hours. CBG: No results for input(s): GLUCAP in the last 168 hours. Lipid Profile: No results for input(s): CHOL, HDL, LDLCALC, TRIG, CHOLHDL, LDLDIRECT in the last 72 hours. Thyroid  Function Tests: No results for input(s): TSH, T4TOTAL, FREET4, T3FREE, THYROIDAB in the last 72 hours. Anemia Panel: No results for input(s): VITAMINB12, FOLATE, FERRITIN, TIBC, IRON, RETICCTPCT in the last 72 hours. Sepsis Labs: No results for input(s): PROCALCITON, LATICACIDVEN in the last 168 hours.  No results found for this or any previous visit (from the past 240 hours).       Radiology Studies: CT ABDOMEN PELVIS W CONTRAST Result Date: 09/24/2024 CLINICAL DATA:  Abdominal pain. EXAM: CT ABDOMEN AND PELVIS WITH CONTRAST TECHNIQUE: Multidetector CT imaging of the abdomen and pelvis was performed using the standard protocol following bolus administration of intravenous contrast.  RADIATION DOSE REDUCTION: This exam was performed according to the departmental dose-optimization program which includes automated exposure control, adjustment of the mA and/or kV according to patient size and/or use of iterative reconstruction technique. CONTRAST:  OMNIPAQUE  IOHEXOL  300 MG/ML  SOLN COMPARISON:  CT abdomen pelvis dated 09/22/2024. FINDINGS: Lower chest: No acute abnormality. No intra-abdominal free air or free fluid. Hepatobiliary: The liver is unremarkable. No biliary dilatation. Small gallstone. No pericholecystic fluid or evidence of acute cholecystitis by CT. Pancreas: Unremarkable. No pancreatic ductal dilatation or surrounding inflammatory changes. Spleen: Small splenic hypodense lesions as seen previously. Adrenals/Urinary Tract: The adrenal glands unremarkable. Small right renal interpolar cyst. There is no hydronephrosis on either side. There is symmetric enhancement and excretion of contrast by both kidneys. The visualized ureters appear unremarkable. The urinary bladder is  decompressed around a Foley catheter. Stomach/Bowel: There is a 1.5 cm duodenal diverticulum. Severe sigmoid diverticulosis. There is moderate stool throughout the colon. There is no bowel obstruction or active inflammation. The appendix is normal. Vascular/Lymphatic: Advanced aortoiliac atherosclerotic disease. The IVC is unremarkable. No portal venous gas. There is no adenopathy. Reproductive: Enlarged prostate gland measuring 7 cm in transverse axial diameter. Other: Small fat containing right inguinal hernia. Musculoskeletal: Osteopenia with degenerative changes spine. No acute osseous or lytic. IMPRESSION: 1. No acute intra-abdominal or pelvic pathology. 2. Severe sigmoid diverticulosis. No bowel obstruction. Normal appendix. 3. Cholelithiasis. 4. Enlarged prostate gland. 5.  Aortic Atherosclerosis (ICD10-I70.0). Electronically Signed   By: Vanetta Chou M.D.   On: 09/24/2024 14:44   DG Abd 1  View Result Date: 09/24/2024 EXAM: 1 VIEW XRAY OF THE ABDOMEN 09/24/2024 10:08:00 AM COMPARISON: None available. CLINICAL HISTORY: Abdominal distention; Vomiting. FINDINGS: BOWEL: Mild gaseous distension of the stomach. There are several loops of small bowel within the left lower quadrant of the abdomen which measure up to 3.2 cm. The colon is nondilated . SOFT TISSUES: Left inguinal hernia repair with mesh noted. Aortoiliac calcified atherosclerosis. No opaque urinary calculi. BONES: Curvature of the lumbar spine is convex towards the left. No acute osseous abnormality. IMPRESSION: 1. Findings suggest ileus versus partial small bowel obstruction with distention of the stomach and left lower quadrant small bowel loops. Electronically signed by: Waddell Calk MD 09/24/2024 10:31 AM EST RP Workstation: [id number]           LOS: 0 days   Time spent= 35 mins    Stanley JAYSON Dare, MD [facility]  If 7PM-7AM, please contact night-coverage  09/25/2024, 11:22 AM

## 2024-09-25 NOTE — Progress Notes (Signed)
 Cipro  can not be filled until tomorrow per insurance. Patient requests cipro  be sent to [facility] pharmacy on [address]. Patient is unsure how many Cipro  he has left at home - recent fill. Patient's son will be here at 1600 to pick up patient.

## 2024-09-25 NOTE — Plan of Care (Signed)
 Patient alert and oriented, all questions answered, patient educated on following up with doctor in regards to foley per MD. Patient to follow up with [employer] [facility] for walker delivery once copay is received.  Cipro  sent to [facility] on [address] per pt request. It can not be filled until tomorrow per insurance.  Patient requested leg bag for foley maintenance and educated on how to measure, and collect urine. Returned demonstration completed. IV removed, intact. No further questions. Patient transported home with son and left in safe condition.  Problem: Education: Goal: Knowledge of General Education information will improve Description: Including pain rating scale, medication(s)/side effects and non-pharmacologic comfort measures Outcome: Adequate for Discharge   Problem: Health Behavior/Discharge Planning: Goal: Ability to manage health-related needs will improve Outcome: Adequate for Discharge   Problem: Clinical Measurements: Goal: Ability to maintain clinical measurements within normal limits will improve Outcome: Adequate for Discharge Goal: Will remain free from infection Outcome: Adequate for Discharge Goal: Diagnostic test results will improve Outcome: Adequate for Discharge Goal: Cardiovascular complication will be avoided Outcome: Adequate for Discharge   Problem: Activity: Goal: Risk for activity intolerance will decrease Outcome: Adequate for Discharge   Problem: Nutrition: Goal: Adequate nutrition will be maintained Outcome: Adequate for Discharge   Problem: Coping: Goal: Level of anxiety will decrease Outcome: Adequate for Discharge   Problem: Elimination: Goal: Will not experience complications related to bowel motility Outcome: Adequate for Discharge Goal: Will not experience complications related to urinary retention Outcome: Adequate for Discharge   Problem: Pain Managment: Goal: General experience of comfort will improve and/or be controlled Outcome:  Adequate for Discharge   Problem: Safety: Goal: Ability to remain free from injury will improve Outcome: Adequate for Discharge   Problem: Skin Integrity: Goal: Risk for impaired skin integrity will decrease Outcome: Adequate for Discharge

## 2024-09-25 NOTE — Evaluation (Signed)
 Physical Therapy Evaluation Patient Details Name: TAKUMI DIN MRN: [id number] DOB: 1935-03-02 Today's Date: 09/25/2024  History of Present Illness  Stanley Waters is a 89 y.o. male presented with worsening hematuria, blood clots in urine and acute urinary retention. Of note, s/p left side prostate artery embolization on 09/20/24. PMH: severe BPH with severe urethral stenosis, PAF s/p cardioversion, HTN, HLD, peripheral neuropathy  Clinical Impression  Pt admitted with above diagnosis. Pt is mobilizing well, he ambulated 160' with RW, no loss of balance. Pt requires increased time for transfers 2* severe penis pain. He reports he has needed level of assistance available from his next door neighbors.  Pt currently with functional limitations due to the deficits listed below (see PT Problem List). Pt will benefit from acute skilled PT to increase their independence and safety with mobility to allow discharge.           If plan is discharge home, recommend the following: Assist for transportation;Assistance with cooking/housework   Can travel by Nurse, Mental Health (4 wheels)  Recommendations for Other Services       Functional Status Assessment Patient has had a recent decline in their functional status and demonstrates the ability to make significant improvements in function in a reasonable and predictable amount of time.     Precautions / Restrictions Precautions Precautions: Fall Recall of Precautions/Restrictions: Intact Precaution/Restrictions Comments: severe penis pain, be mindful of Foley Restrictions Weight Bearing Restrictions Per Provider Order: No      Mobility  Bed Mobility               General bed mobility comments: up in recliner    Transfers Overall transfer level: Needs assistance Equipment used: Rolling walker (2 wheels) Transfers: Sit to/from Stand Sit to Stand: Supervision           General  transfer comment: VCs hand placement    Ambulation/Gait Ambulation/Gait assistance: Supervision Gait Distance (Feet): 160 Feet Assistive device: Rolling walker (2 wheels) Gait Pattern/deviations: Step-through pattern, Decreased stride length Gait velocity: decr     General Gait Details: steady, no loss of balance  Stairs            Wheelchair Mobility     Tilt Bed    Modified Rankin (Stroke Patients Only)       Balance Overall balance assessment: Modified Independent                                           Pertinent Vitals/Pain Pain Assessment Pain Assessment: 0-10 Pain Score: 8  Pain Location: penis Pain Descriptors / Indicators: Sore Pain Intervention(s): Limited activity within patient's tolerance, Monitored during session, Premedicated before session, Repositioned    Home Living Family/patient expects to be discharged to:: Private residence Living Arrangements: Alone Available Help at Discharge: Neighbor;Available PRN/intermittently   Home Access: Level entry       Home Layout: One level Home Equipment: None      Prior Function Prior Level of Function : Independent/Modified Independent;Driving             Mobility Comments: walks without AD, had 1 recent fall getting up in the night to use the bathroom and misjudged where the bed was and slipped off of bed with not injury ADLs Comments: independent     Extremity/Trunk Assessment   Upper Extremity Assessment  Upper Extremity Assessment: Overall WFL for tasks assessed    Lower Extremity Assessment Lower Extremity Assessment: Overall WFL for tasks assessed;RLE deficits/detail;LLE deficits/detail RLE Sensation: history of peripheral neuropathy;decreased light touch LLE Sensation: decreased light touch;history of peripheral neuropathy    Cervical / Trunk Assessment Cervical / Trunk Assessment: Normal  Communication   Communication Communication: Impaired Factors  Affecting Communication: Hearing impaired    Cognition Arousal: Alert Behavior During Therapy: WFL for tasks assessed/performed   PT - Cognitive impairments: No apparent impairments                         Following commands: Intact       Cueing       General Comments      Exercises     Assessment/Plan    PT Assessment Patient needs continued PT services  PT Problem List Decreased activity tolerance;Decreased mobility;Pain       PT Treatment Interventions Gait training;Therapeutic exercise;DME instruction    PT Goals (Current goals can be found in the Care Plan section)  Acute Rehab PT Goals Patient Stated Goal: return to independence PT Goal Formulation: With patient Time For Goal Achievement: 10/09/24 Potential to Achieve Goals: Good    Frequency Min 3X/week     Co-evaluation               AM-PAC PT 6 Clicks Mobility  Outcome Measure Help needed turning from your back to your side while in a flat bed without using bedrails?: None Help needed moving from lying on your back to sitting on the side of a flat bed without using bedrails?: A Little Help needed moving to and from a bed to a chair (including a wheelchair)?: None Help needed standing up from a chair using your arms (e.g., wheelchair or bedside chair)?: None Help needed to walk in hospital room?: None Help needed climbing 3-5 steps with a railing? : A Little 6 Click Score: 22    End of Session Equipment Utilized During Treatment: Gait belt Activity Tolerance: Patient tolerated treatment well Patient left: in chair;with call bell/phone within reach;with chair alarm set Nurse Communication: Mobility status PT Visit Diagnosis: Difficulty in walking, not elsewhere classified (R26.2)    Time: 8984-8968 PT Time Calculation (min) (ACUTE ONLY): 16 min   Charges:   PT Evaluation $PT Eval Low Complexity: 1 Low   PT General Charges $$ ACUTE PT VISIT: 1 Visit         Sylvan Delon Copp PT 09/25/2024  Acute Rehabilitation Services  Office [phone]

## 2024-09-26 NOTE — Discharge Summary (Signed)
 Physician Discharge Summary  Stanley Waters FMW:[id number] DOB: [birth year] DOA: 09/22/2024  PCP: Clarice Nottingham, MD  Admit date: 09/22/2024 Discharge date: 09/25/24  Admitted From: Disposition:    Recommendations for Outpatient Follow-up:  Follow up with PCP in 1-2 weeks Please obtain BMP/CBC in one week your next doctors visit.  Follow-up outpatient urology.  Maintain Foley catheter Plans to resume Xarelto  on 09/26/2024 per urology.  Dr. Shane to arrange further follow-up, discussed with him Home health services have been arranged   Discharge Condition: Stable CODE STATUS: Full code Diet recommendation: Heart healthy  Brief/Interim Summary: Brief Narrative:  89 year old with history of severe BPH with severe urethral stenosis status post left-sided prostate artery embolization 09/20/2024, P A-fib status post cardioversion on Tikosyn  and Xarelto , HTN, HLD, peripheral neuropathy admitted for persistent hematuria.  CT abdomen pelvis showed markedly enlarged prostate.  Urology was consulted who placed a Foley catheter in.  Empirically getting IV Rocephin , Xarelto  is on hold.  Slowly this appears to be clearing up Started reporting of nausea and vomiting with abnormal abdominal x-ray therefore CT scan ordered which was unremarkable Patient was seen by PT/OT who did not have any further recommendations.  DME walker with seat was ordered.  Assessment & Plan:  Acute urinary retention with recurrent hematuria Severe BPH status post left prostate artery embolization 11/19 - Seen by urology, Foley catheter placed.  Hematuria has now subsided.  Discussed with Dr. Shane from urology who will arrange for outpatient follow-up in the meantime we will discharge him with Foley catheter  Nausea vomiting, resolved - X-ray suggestive of partial small bowel obstruction versus ileus.  CT abdomen pelvis was unremarkable  Paroxysmal atrial fibrillation - Currently in sinus rhythm on Tikosyn .   Some Xarelto  starting tomorrow  Essential hypertension Congestive heart failure with preserved ejection fraction - Upon discharge will resume patient's Lasix , lisinopril .  He will need to follow-up outpatient cardiology   DVT prophylaxis: SCDs Start: 09/22/24 1507    Code Status: Full Code Family Communication:   Continue hospital stay until improvement in his hematuria and urology clearance. PT Follow up Recs:   Subjective:  Sitting up in the chair, feels better.   Examination:  General exam: Appears calm and comfortable  Respiratory system: Clear to auscultation. Respiratory effort normal. Cardiovascular system: S1 & S2 heard, RRR. No JVD, murmurs, rubs, gallops or clicks. No pedal edema. Gastrointestinal system: Abdominal distention, diminished bowel sounds. Central nervous system: Alert and oriented. No focal neurological deficits. Extremities: Symmetric 5 x 5 power. Skin: No rashes, lesions or ulcers Psychiatry: Judgement and insight appear normal. Mood & affect appropriate. Foley in place with clear urine   Discharge Diagnoses:  Principal Problem:   Urinary retention Active Problems:   Acute urinary retention      Discharge Exam: Vitals:   09/24/24 2044 09/25/24 0540  BP: (!) 141/72 120/70  Pulse: 73 72  Resp: 18 14  Temp: 98.5 F (36.9 C) 97.9 F (36.6 C)  SpO2: 97% 97%   Vitals:   09/24/24 1253 09/24/24 2039 09/24/24 2044 09/25/24 0540  BP: (!) 140/63 (!) 141/72 (!) 141/72 120/70  Pulse: 74  73 72  Resp: 16  18 14   Temp: 97.9 F (36.6 C)  98.5 F (36.9 C) 97.9 F (36.6 C)  TempSrc: Oral  Oral Oral  SpO2: 94%  97% 97%  Weight:      Height:          Discharge Instructions   Allergies as of 09/25/2024  Reactions   Prednisone  Swelling, Other (See Comments), Hypertension   Tachycardia, also   Trazodone  Hcl Other (See Comments)   Doxycycline Rash        Medication List     PAUSE taking these medications    phenazopyridine   100 MG tablet Wait to take this until your doctor or other care provider tells you to start again. Commonly known as: Pyridium  Take 1 tablet (100 mg total) by mouth 3 (three) times daily as needed. What changed: reasons to take this       TAKE these medications    aspirin EC 325 MG tablet Take 325 mg by mouth 2 (two) times daily as needed for mild pain (pain score 1-3) (or headaches).   B-12 2500 MCG Subl Place 2,500 mcg under the tongue daily.   Calcium  Citrate 250 MG Tabs Take 1 tablet (250 mg total) by mouth daily with breakfast.   ciprofloxacin  500 MG tablet Commonly known as: Cipro  Take 1 tablet (500 mg total) by mouth 2 (two) times daily for 4 days.   dofetilide  250 MCG capsule Commonly known as: TIKOSYN  Take 1 capsule (250 mcg total) by mouth 2 (two) times daily.   doxazosin  8 MG tablet Commonly known as: CARDURA  Take 1 tablet (8 mg total) by mouth at bedtime.   ezetimibe  10 MG tablet Commonly known as: ZETIA  Take 10 mg by mouth daily.   furosemide  20 MG tablet Commonly known as: LASIX  Take 20 mg by mouth daily in the afternoon.   lisinopril  20 MG tablet Commonly known as: ZESTRIL  Take 20 mg by mouth daily as needed (for a Systolic reading of 160 or greater).   multivitamin Tabs tablet Take 1 tablet by mouth daily. What changed: when to take this   Myrbetriq  50 MG Tb24 tablet Generic drug: mirabegron  ER Take 1 tablet (50 mg total) by mouth daily.   nitroGLYCERIN  0.4 MG SL tablet Commonly known as: NITROSTAT  PLACE 1 TABLET UNDER THE TONGUE EVERY 5 MINUTES AS NEEDED FOR CHEST PAIN What changed:  how much to take how to take this when to take this reasons to take this additional instructions   oxyCODONE -acetaminophen  5-325 MG tablet Commonly known as: Percocet Take 1 tablet by mouth every 6 (six) hours as needed for severe pain (pain score 7-10).   polyethylene glycol 17 g packet Commonly known as: MIRALAX  / GLYCOLAX  Take 17 g by mouth at  bedtime.   rivaroxaban  20 MG Tabs tablet Commonly known as: Xarelto  Take 1 tablet (20 mg total) by mouth daily with supper.   rOPINIRole  0.5 MG tablet Commonly known as: REQUIP  Take 0.5 mg by mouth at bedtime.   solifenacin  5 MG tablet Commonly known as: VESICARE  Take 1 tablet (5 mg total) by mouth daily.   Systane Complete 0.6 % Soln Generic drug: Propylene Glycol Place 1 drop into both eyes 3 (three) times daily as needed (for dryness).   vitamin C  250 MG tablet Commonly known as: ASCORBIC ACID Take 1 tablet (250 mg total) by mouth daily.   Vitamin D3 50 MCG (2000 UT) Tabs Take 2,000 Units by mouth daily.        Follow-up Information     Clarice Nottingham, MD Follow up in 1 week(s).   Specialty: Internal Medicine Contact information: [address] [place] [address] [place] [address] [phone]         [facility], [facility]. Call today.   Why: Once you are home from the hospital call [employer] [facility] at  [phone] to make your copayment for Roll[employer] and then it will be delivered to your home. Contact information: [address] [address] [phone]                Allergies  Allergen Reactions   Prednisone  Swelling, Other (See Comments) and Hypertension    Tachycardia, also   Trazodone  Hcl Other (See Comments)   Doxycycline Rash    You were cared for by a hospitalist during your hospital stay. If you have any questions about your discharge medications or the care you received while you were in the hospital after you are discharged, you can call the unit and asked to speak with the hospitalist on call if the hospitalist that took care of you is not available. Once you are discharged, your primary care physician will handle any further medical issues. Please note that no refills for any discharge medications will be authorized once you are discharged, as it is imperative that you return to your primary care physician (or  establish a relationship with a primary care physician if you do not have one) for your aftercare needs so that they can reassess your need for medications and monitor your lab values.  You were cared for by a hospitalist during your hospital stay. If you have any questions about your discharge medications or the care you received while you were in the hospital after you are discharged, you can call the unit and asked to speak with the hospitalist on call if the hospitalist that took care of you is not available. Once you are discharged, your primary care physician will handle any further medical issues. Please note that NO REFILLS for any discharge medications will be authorized once you are discharged, as it is imperative that you return to your primary care physician (or establish a relationship with a primary care physician if you do not have one) for your aftercare needs so that they can reassess your need for medications and monitor your lab values.  Please request your Prim.MD to go over all Hospital Tests and Procedure/Radiological results at the follow up, please get all Hospital records sent to your Prim MD by signing hospital release before you go home.  Get CBC, CMP, 2 view Chest X ray checked  by Primary MD during your next visit or SNF MD in 5-7 days ( we routinely change or add medications that can affect your baseline labs and fluid status, therefore we recommend that you get the mentioned basic workup next visit with your PCP, your PCP may decide not to get them or add new tests based on their clinical decision)  On your next visit with your primary care physician please Get Medicines reviewed and adjusted.  If you experience worsening of your admission symptoms, develop shortness of breath, life threatening emergency, suicidal or homicidal thoughts you must seek medical attention immediately by calling 911 or calling your MD immediately  if symptoms less severe.  You Must read complete  instructions/literature along with all the possible adverse reactions/side effects for all the Medicines you take and that have been prescribed to you. Take any new Medicines after you have completely understood and accpet all the possible adverse reactions/side effects.   Do not drive, operate heavy machinery, perform activities at heights, swimming or participation in water activities or provide baby sitting services if your were admitted for syncope or siezures until you have seen by Primary MD or a Neurologist and advised to do so again.  Do not drive when taking Pain medications.   Procedures/Studies: CT ABDOMEN PELVIS W CONTRAST Result Date: 09/24/2024 CLINICAL DATA:  Abdominal pain. EXAM: CT ABDOMEN AND PELVIS WITH CONTRAST TECHNIQUE: Multidetector CT imaging of the abdomen and pelvis was performed using the standard protocol following bolus administration of intravenous contrast. RADIATION DOSE REDUCTION: This exam was performed according to the departmental dose-optimization program which includes automated exposure control, adjustment of the mA and/or kV according to patient size and/or use of iterative reconstruction technique. CONTRAST:  OMNIPAQUE  IOHEXOL  300 MG/ML  SOLN COMPARISON:  CT abdomen pelvis dated 09/22/2024. FINDINGS: Lower chest: No acute abnormality. No intra-abdominal free air or free fluid. Hepatobiliary: The liver is unremarkable. No biliary dilatation. Small gallstone. No pericholecystic fluid or evidence of acute cholecystitis by CT. Pancreas: Unremarkable. No pancreatic ductal dilatation or surrounding inflammatory changes. Spleen: Small splenic hypodense lesions as seen previously. Adrenals/Urinary Tract: The adrenal glands unremarkable. Small right renal interpolar cyst. There is no hydronephrosis on either side. There is symmetric enhancement and excretion of contrast by both kidneys. The visualized ureters appear unremarkable. The urinary bladder is decompressed  around a Foley catheter. Stomach/Bowel: There is a 1.5 cm duodenal diverticulum. Severe sigmoid diverticulosis. There is moderate stool throughout the colon. There is no bowel obstruction or active inflammation. The appendix is normal. Vascular/Lymphatic: Advanced aortoiliac atherosclerotic disease. The IVC is unremarkable. No portal venous gas. There is no adenopathy. Reproductive: Enlarged prostate gland measuring 7 cm in transverse axial diameter. Other: Small fat containing right inguinal hernia. Musculoskeletal: Osteopenia with degenerative changes spine. No acute osseous or lytic. IMPRESSION: 1. No acute intra-abdominal or pelvic pathology. 2. Severe sigmoid diverticulosis. No bowel obstruction. Normal appendix. 3. Cholelithiasis. 4. Enlarged prostate gland. 5.  Aortic Atherosclerosis (ICD10-I70.0). Electronically Signed   By: Vanetta Chou M.D.   On: 09/24/2024 14:44   DG Abd 1 View Result Date: 09/24/2024 EXAM: 1 VIEW XRAY OF THE ABDOMEN 09/24/2024 10:08:00 AM COMPARISON: None available. CLINICAL HISTORY: Abdominal distention; Vomiting. FINDINGS: BOWEL: Mild gaseous distension of the stomach. There are several loops of small bowel within the left lower quadrant of the abdomen which measure up to 3.2 cm. The colon is nondilated . SOFT TISSUES: Left inguinal hernia repair with mesh noted. Aortoiliac calcified atherosclerosis. No opaque urinary calculi. BONES: Curvature of the lumbar spine is convex towards the left. No acute osseous abnormality. IMPRESSION: 1. Findings suggest ileus versus partial small bowel obstruction with distention of the stomach and left lower quadrant small bowel loops. Electronically signed by: Waddell Calk MD 09/24/2024 10:31 AM EST RP Workstation: [id number]   CT HEMATURIA WORKUP Result Date: 09/22/2024 CLINICAL DATA:  Hematuria. History of benign prostatic hypertrophy status post left prostate artery embolization 09/20/2024 EXAM: CT ABDOMEN AND PELVIS WITHOUT AND WITH  CONTRAST TECHNIQUE: Multidetector CT imaging of the abdomen and pelvis was performed following the standard protocol before and following the bolus administration of intravenous contrast. RADIATION DOSE REDUCTION: This exam was performed according to the departmental dose-optimization program which includes automated exposure control, adjustment of the mA and/or kV according to patient size and/or use of iterative reconstruction technique. CONTRAST:  OMNIPAQUE  IOHEXOL  300 MG/ML  SOLN COMPARISON:  CT abdomen dated 12/10/2022, CTA pelvis dated 08/10/2024 FINDINGS: Lower chest: Bibasilar dendriform ossification. No pleural effusion or pneumothorax demonstrated. Partially imaged multichamber cardiomegaly. Hepatobiliary: No focal hepatic lesions. No intra or extrahepatic biliary ductal dilation. Cholelithiasis. Pancreas: No focal lesions or main ductal dilation. Spleen: Nonenhancing 2.7 cm splenic hypodensity (3:33), unchanged from 12/10/2022, likely  benign. Mildly enlarged spleen measures 14.6 cm, previously 13.8 cm on 12/10/2022. Additional subcentimeter hypodensity, too small to characterize, but also likely cyst. Adrenals/Urinary Tract: No adrenal nodules. Duplex right kidney. No suspicious renal mass, calculi or hydronephrosis. 1.7 cm simple right parapelvic cyst (12:33). No specific follow-up imaging recommended. Segmental mild right ureteral narrowing, which may be related to peristalsis. No definite filling defects. Mildly trabeculated urinary bladder. Stomach/Bowel: Normal appearance of the stomach. No evidence of bowel wall thickening, distention, or inflammatory changes. Colonic diverticulosis without acute diverticulitis. Normal appendix. Vascular/Lymphatic: Aortic atherosclerosis. No enlarged abdominal or pelvic lymph nodes. Reproductive: Markedly enlarged prostate gland. 1.5 cm rounded hyperdensity in the left prostatic base (3:76), new compared to 08/10/2024, likely hemorrhage. Other: No free fluid,  fluid collection, or free air. Musculoskeletal: No acute or abnormal lytic or blastic osseous lesions. Multilevel degenerative changes of the partially imaged thoracic and lumbar spine. Small fat-containing right inguinal hernia. Postsurgical changes of left anterior lower abdominal wall mesh hernia repair. Postsurgical changes of right inguinal region. IMPRESSION: 1. Markedly enlarged prostate gland with 1.5 cm rounded hyperdensity in the left prostatic base, new compared to 08/10/2024, likely hemorrhage in the setting of recent left prostate artery embolization. 2. No urolithiasis, filling defects, or urinary tract dilation. No suspicious renal or urothelial lesions. 3. Increased mild splenomegaly. 4.  Aortic Atherosclerosis (ICD10-I70.0). Electronically Signed   By: Limin  Xu M.D.   On: 09/22/2024 12:28   IR EMBO TUMOR ORGAN ISCHEMIA INFARCT INC GUIDE ROADMAPPING Result Date: 09/20/2024 CLINICAL DATA:  Benign prostatic hypertrophy with persistent lower urinary tract symptoms. See previous consultation. EXAM: EXAM LEFT PROSTATE ARTERY EMBOLIZATION TECHNIQUE: Informed consent was obtained from the patient following explanation of the procedure, risks, benefits and alternatives. The patient understands, agrees and consents for the procedure. All questions were addressed. A time out was performed prior to the initiation of the procedure. Maximal barrier sterile technique utilized including caps, mask, sterile gowns, sterile gloves, large sterile drape, hand hygiene, and chlorhexidine  prep. Intravenous Fentanyl  200mcg and Versed  4mg  were administered by RN during a total moderate (conscious) sedation time of 166 minutes; the patient's level of consciousness and physiological / cardiorespiratory status were monitored continuously by radiology RN under my direct supervision. A micropuncture set was used access the right common femoral artery under ultrasound. Bentson wire advanced into the infrarenal aorta. 5 French  vascular sheath placed. Through this, a Sos catheter placed across the aortic bifurcation and Glidewire advanced to the left SFA. The Sos was exchanged for a 4 French straight glide catheter, advanced to the left SFA. Guidewire exchanged for stiff allied, then the glide catheter was exchanged for a long 35 cm 5 French sheath, advanced to the distal left common iliac artery. Through this, a C2 catheter was advanced into the proximal anterior division of the left internal iliac artery. Selective arteriography was obtained. The anterior tibial artery was selectively catheterized with a coaxial microcatheter and selective arteriography was performed. The left prostatic artery was catheterized with a microcatheter and selective arteriography and cone beam CT or performed, confirming flow to the prostate without significant nontarget enhancement. 50 mcg nitroglycerin  were administered intra-arterially into the proximal left prostatic artery. Branches of the left prostatic artery were embolized with 100-300 micron embospheres to near cessation of flow. Follow-up arteriogram significant demonstrates significant pruning of branches to the prostate with significant stasis in the proximal left prostatic artery. The microcatheter and angiographic catheter removed. The long 5 French sheath was exchanged over the Bentson wire for  a irregular 5 French she. The Sos catheter was again utilized to with the aid of a Bentson wire to catheterize the internal iliac artery for selective cone beam CT. There was into terminated localization of possible prostatic artery branch arising from the distal internal pudendal artery. Multiple attempts with a broad a of microcatheter and guidewire combinations were attempted but this vessel segment cannot be adequately catheterized. No other conspicuous candidates for right prostatic artery were identified. The microcatheter and angiographic catheter were withdrawn. The sheath was removed, hemostasis  achieved, and closure obtained with 5 French Celt device. The patient tolerated the procedure well was transferred to the recovery area in good condition. FLUOROSCOPY: Radiation Exposure Index (as provided by the fluoroscopic device): 3208 mGy air Kerma COMPLICATIONS: COMPLICATIONS none IMPRESSION: 1. Technically successful left prostate artery embolization. 2. Deferred right prostate artery embolization due indeterminate localization and failure to catheterize suspected origin. Electronically Signed   By: JONETTA Faes M.D.   On: 09/20/2024 17:00   IR US  Guide Vasc Access Right Result Date: 09/20/2024 CLINICAL DATA:  Benign prostatic hypertrophy with persistent lower urinary tract symptoms. See previous consultation. EXAM: EXAM LEFT PROSTATE ARTERY EMBOLIZATION TECHNIQUE: Informed consent was obtained from the patient following explanation of the procedure, risks, benefits and alternatives. The patient understands, agrees and consents for the procedure. All questions were addressed. A time out was performed prior to the initiation of the procedure. Maximal barrier sterile technique utilized including caps, mask, sterile gowns, sterile gloves, large sterile drape, hand hygiene, and chlorhexidine  prep. Intravenous Fentanyl  200mcg and Versed  4mg  were administered by RN during a total moderate (conscious) sedation time of 166 minutes; the patient's level of consciousness and physiological / cardiorespiratory status were monitored continuously by radiology RN under my direct supervision. A micropuncture set was used access the right common femoral artery under ultrasound. Bentson wire advanced into the infrarenal aorta. 5 French vascular sheath placed. Through this, a Sos catheter placed across the aortic bifurcation and Glidewire advanced to the left SFA. The Sos was exchanged for a 4 French straight glide catheter, advanced to the left SFA. Guidewire exchanged for stiff allied, then the glide catheter was exchanged  for a long 35 cm 5 French sheath, advanced to the distal left common iliac artery. Through this, a C2 catheter was advanced into the proximal anterior division of the left internal iliac artery. Selective arteriography was obtained. The anterior tibial artery was selectively catheterized with a coaxial microcatheter and selective arteriography was performed. The left prostatic artery was catheterized with a microcatheter and selective arteriography and cone beam CT or performed, confirming flow to the prostate without significant nontarget enhancement. 50 mcg nitroglycerin  were administered intra-arterially into the proximal left prostatic artery. Branches of the left prostatic artery were embolized with 100-300 micron embospheres to near cessation of flow. Follow-up arteriogram significant demonstrates significant pruning of branches to the prostate with significant stasis in the proximal left prostatic artery. The microcatheter and angiographic catheter removed. The long 5 French sheath was exchanged over the Bentson wire for a irregular 5 French she. The Sos catheter was again utilized to with the aid of a Bentson wire to catheterize the internal iliac artery for selective cone beam CT. There was into terminated localization of possible prostatic artery branch arising from the distal internal pudendal artery. Multiple attempts with a broad a of microcatheter and guidewire combinations were attempted but this vessel segment cannot be adequately catheterized. No other conspicuous candidates for right prostatic artery were identified. The  microcatheter and angiographic catheter were withdrawn. The sheath was removed, hemostasis achieved, and closure obtained with 5 French Celt device. The patient tolerated the procedure well was transferred to the recovery area in good condition. FLUOROSCOPY: Radiation Exposure Index (as provided by the fluoroscopic device): 3208 mGy air Kerma COMPLICATIONS: COMPLICATIONS none  IMPRESSION: 1. Technically successful left prostate artery embolization. 2. Deferred right prostate artery embolization due indeterminate localization and failure to catheterize suspected origin. Electronically Signed   By: JONETTA Faes M.D.   On: 09/20/2024 17:00   IR Angiogram Pelvis Selective Or Supraselective Result Date: 09/20/2024 CLINICAL DATA:  Benign prostatic hypertrophy with persistent lower urinary tract symptoms. See previous consultation. EXAM: EXAM LEFT PROSTATE ARTERY EMBOLIZATION TECHNIQUE: Informed consent was obtained from the patient following explanation of the procedure, risks, benefits and alternatives. The patient understands, agrees and consents for the procedure. All questions were addressed. A time out was performed prior to the initiation of the procedure. Maximal barrier sterile technique utilized including caps, mask, sterile gowns, sterile gloves, large sterile drape, hand hygiene, and chlorhexidine  prep. Intravenous Fentanyl  200mcg and Versed  4mg  were administered by RN during a total moderate (conscious) sedation time of 166 minutes; the patient's level of consciousness and physiological / cardiorespiratory status were monitored continuously by radiology RN under my direct supervision. A micropuncture set was used access the right common femoral artery under ultrasound. Bentson wire advanced into the infrarenal aorta. 5 French vascular sheath placed. Through this, a Sos catheter placed across the aortic bifurcation and Glidewire advanced to the left SFA. The Sos was exchanged for a 4 French straight glide catheter, advanced to the left SFA. Guidewire exchanged for stiff allied, then the glide catheter was exchanged for a long 35 cm 5 French sheath, advanced to the distal left common iliac artery. Through this, a C2 catheter was advanced into the proximal anterior division of the left internal iliac artery. Selective arteriography was obtained. The anterior tibial artery was  selectively catheterized with a coaxial microcatheter and selective arteriography was performed. The left prostatic artery was catheterized with a microcatheter and selective arteriography and cone beam CT or performed, confirming flow to the prostate without significant nontarget enhancement. 50 mcg nitroglycerin  were administered intra-arterially into the proximal left prostatic artery. Branches of the left prostatic artery were embolized with 100-300 micron embospheres to near cessation of flow. Follow-up arteriogram significant demonstrates significant pruning of branches to the prostate with significant stasis in the proximal left prostatic artery. The microcatheter and angiographic catheter removed. The long 5 French sheath was exchanged over the Bentson wire for a irregular 5 French she. The Sos catheter was again utilized to with the aid of a Bentson wire to catheterize the internal iliac artery for selective cone beam CT. There was into terminated localization of possible prostatic artery branch arising from the distal internal pudendal artery. Multiple attempts with a broad a of microcatheter and guidewire combinations were attempted but this vessel segment cannot be adequately catheterized. No other conspicuous candidates for right prostatic artery were identified. The microcatheter and angiographic catheter were withdrawn. The sheath was removed, hemostasis achieved, and closure obtained with 5 French Celt device. The patient tolerated the procedure well was transferred to the recovery area in good condition. FLUOROSCOPY: Radiation Exposure Index (as provided by the fluoroscopic device): 3208 mGy air Kerma COMPLICATIONS: COMPLICATIONS none IMPRESSION: 1. Technically successful left prostate artery embolization. 2. Deferred right prostate artery embolization due indeterminate localization and failure to catheterize suspected origin. Electronically Signed  By: JONETTA Faes M.D.   On: 09/20/2024 17:00   IR  Angiogram Selective Each Additional Vessel Result Date: 09/20/2024 CLINICAL DATA:  Benign prostatic hypertrophy with persistent lower urinary tract symptoms. See previous consultation. EXAM: EXAM LEFT PROSTATE ARTERY EMBOLIZATION TECHNIQUE: Informed consent was obtained from the patient following explanation of the procedure, risks, benefits and alternatives. The patient understands, agrees and consents for the procedure. All questions were addressed. A time out was performed prior to the initiation of the procedure. Maximal barrier sterile technique utilized including caps, mask, sterile gowns, sterile gloves, large sterile drape, hand hygiene, and chlorhexidine  prep. Intravenous Fentanyl  200mcg and Versed  4mg  were administered by RN during a total moderate (conscious) sedation time of 166 minutes; the patient's level of consciousness and physiological / cardiorespiratory status were monitored continuously by radiology RN under my direct supervision. A micropuncture set was used access the right common femoral artery under ultrasound. Bentson wire advanced into the infrarenal aorta. 5 French vascular sheath placed. Through this, a Sos catheter placed across the aortic bifurcation and Glidewire advanced to the left SFA. The Sos was exchanged for a 4 French straight glide catheter, advanced to the left SFA. Guidewire exchanged for stiff allied, then the glide catheter was exchanged for a long 35 cm 5 French sheath, advanced to the distal left common iliac artery. Through this, a C2 catheter was advanced into the proximal anterior division of the left internal iliac artery. Selective arteriography was obtained. The anterior tibial artery was selectively catheterized with a coaxial microcatheter and selective arteriography was performed. The left prostatic artery was catheterized with a microcatheter and selective arteriography and cone beam CT or performed, confirming flow to the prostate without significant  nontarget enhancement. 50 mcg nitroglycerin  were administered intra-arterially into the proximal left prostatic artery. Branches of the left prostatic artery were embolized with 100-300 micron embospheres to near cessation of flow. Follow-up arteriogram significant demonstrates significant pruning of branches to the prostate with significant stasis in the proximal left prostatic artery. The microcatheter and angiographic catheter removed. The long 5 French sheath was exchanged over the Bentson wire for a irregular 5 French she. The Sos catheter was again utilized to with the aid of a Bentson wire to catheterize the internal iliac artery for selective cone beam CT. There was into terminated localization of possible prostatic artery branch arising from the distal internal pudendal artery. Multiple attempts with a broad a of microcatheter and guidewire combinations were attempted but this vessel segment cannot be adequately catheterized. No other conspicuous candidates for right prostatic artery were identified. The microcatheter and angiographic catheter were withdrawn. The sheath was removed, hemostasis achieved, and closure obtained with 5 French Celt device. The patient tolerated the procedure well was transferred to the recovery area in good condition. FLUOROSCOPY: Radiation Exposure Index (as provided by the fluoroscopic device): 3208 mGy air Kerma COMPLICATIONS: COMPLICATIONS none IMPRESSION: 1. Technically successful left prostate artery embolization. 2. Deferred right prostate artery embolization due indeterminate localization and failure to catheterize suspected origin. Electronically Signed   By: JONETTA Faes M.D.   On: 09/20/2024 17:00   IR CT PELVIS W/CM Result Date: 09/20/2024 CLINICAL DATA:  Benign prostatic hypertrophy with persistent lower urinary tract symptoms. See previous consultation. EXAM: EXAM LEFT PROSTATE ARTERY EMBOLIZATION TECHNIQUE: Informed consent was obtained from the patient following  explanation of the procedure, risks, benefits and alternatives. The patient understands, agrees and consents for the procedure. All questions were addressed. A time out was performed prior to the  initiation of the procedure. Maximal barrier sterile technique utilized including caps, mask, sterile gowns, sterile gloves, large sterile drape, hand hygiene, and chlorhexidine  prep. Intravenous Fentanyl  200mcg and Versed  4mg  were administered by RN during a total moderate (conscious) sedation time of 166 minutes; the patient's level of consciousness and physiological / cardiorespiratory status were monitored continuously by radiology RN under my direct supervision. A micropuncture set was used access the right common femoral artery under ultrasound. Bentson wire advanced into the infrarenal aorta. 5 French vascular sheath placed. Through this, a Sos catheter placed across the aortic bifurcation and Glidewire advanced to the left SFA. The Sos was exchanged for a 4 French straight glide catheter, advanced to the left SFA. Guidewire exchanged for stiff allied, then the glide catheter was exchanged for a long 35 cm 5 French sheath, advanced to the distal left common iliac artery. Through this, a C2 catheter was advanced into the proximal anterior division of the left internal iliac artery. Selective arteriography was obtained. The anterior tibial artery was selectively catheterized with a coaxial microcatheter and selective arteriography was performed. The left prostatic artery was catheterized with a microcatheter and selective arteriography and cone beam CT or performed, confirming flow to the prostate without significant nontarget enhancement. 50 mcg nitroglycerin  were administered intra-arterially into the proximal left prostatic artery. Branches of the left prostatic artery were embolized with 100-300 micron embospheres to near cessation of flow. Follow-up arteriogram significant demonstrates significant pruning of  branches to the prostate with significant stasis in the proximal left prostatic artery. The microcatheter and angiographic catheter removed. The long 5 French sheath was exchanged over the Bentson wire for a irregular 5 French she. The Sos catheter was again utilized to with the aid of a Bentson wire to catheterize the internal iliac artery for selective cone beam CT. There was into terminated localization of possible prostatic artery branch arising from the distal internal pudendal artery. Multiple attempts with a broad a of microcatheter and guidewire combinations were attempted but this vessel segment cannot be adequately catheterized. No other conspicuous candidates for right prostatic artery were identified. The microcatheter and angiographic catheter were withdrawn. The sheath was removed, hemostasis achieved, and closure obtained with 5 French Celt device. The patient tolerated the procedure well was transferred to the recovery area in good condition. FLUOROSCOPY: Radiation Exposure Index (as provided by the fluoroscopic device): 3208 mGy air Kerma COMPLICATIONS: COMPLICATIONS none IMPRESSION: 1. Technically successful left prostate artery embolization. 2. Deferred right prostate artery embolization due indeterminate localization and failure to catheterize suspected origin. Electronically Signed   By: JONETTA Faes M.D.   On: 09/20/2024 17:00   IR 3D Independent [place] Result Date: 09/20/2024 CLINICAL DATA:  Benign prostatic hypertrophy with persistent lower urinary tract symptoms. See previous consultation. EXAM: EXAM LEFT PROSTATE ARTERY EMBOLIZATION TECHNIQUE: Informed consent was obtained from the patient following explanation of the procedure, risks, benefits and alternatives. The patient understands, agrees and consents for the procedure. All questions were addressed. A time out was performed prior to the initiation of the procedure. Maximal barrier sterile technique utilized including caps, mask,  sterile gowns, sterile gloves, large sterile drape, hand hygiene, and chlorhexidine  prep. Intravenous Fentanyl  200mcg and Versed  4mg  were administered by RN during a total moderate (conscious) sedation time of 166 minutes; the patient's level of consciousness and physiological / cardiorespiratory status were monitored continuously by radiology RN under my direct supervision. A micropuncture set was used access the right common femoral artery under ultrasound. Bentson wire advanced into  the infrarenal aorta. 5 French vascular sheath placed. Through this, a Sos catheter placed across the aortic bifurcation and Glidewire advanced to the left SFA. The Sos was exchanged for a 4 French straight glide catheter, advanced to the left SFA. Guidewire exchanged for stiff allied, then the glide catheter was exchanged for a long 35 cm 5 French sheath, advanced to the distal left common iliac artery. Through this, a C2 catheter was advanced into the proximal anterior division of the left internal iliac artery. Selective arteriography was obtained. The anterior tibial artery was selectively catheterized with a coaxial microcatheter and selective arteriography was performed. The left prostatic artery was catheterized with a microcatheter and selective arteriography and cone beam CT or performed, confirming flow to the prostate without significant nontarget enhancement. 50 mcg nitroglycerin  were administered intra-arterially into the proximal left prostatic artery. Branches of the left prostatic artery were embolized with 100-300 micron embospheres to near cessation of flow. Follow-up arteriogram significant demonstrates significant pruning of branches to the prostate with significant stasis in the proximal left prostatic artery. The microcatheter and angiographic catheter removed. The long 5 French sheath was exchanged over the Bentson wire for a irregular 5 French she. The Sos catheter was again utilized to with the aid of a  Bentson wire to catheterize the internal iliac artery for selective cone beam CT. There was into terminated localization of possible prostatic artery branch arising from the distal internal pudendal artery. Multiple attempts with a broad a of microcatheter and guidewire combinations were attempted but this vessel segment cannot be adequately catheterized. No other conspicuous candidates for right prostatic artery were identified. The microcatheter and angiographic catheter were withdrawn. The sheath was removed, hemostasis achieved, and closure obtained with 5 French Celt device. The patient tolerated the procedure well was transferred to the recovery area in good condition. FLUOROSCOPY: Radiation Exposure Index (as provided by the fluoroscopic device): 3208 mGy air Kerma COMPLICATIONS: COMPLICATIONS none IMPRESSION: 1. Technically successful left prostate artery embolization. 2. Deferred right prostate artery embolization due indeterminate localization and failure to catheterize suspected origin. Electronically Signed   By: JONETTA Faes M.D.   On: 09/20/2024 17:00   IR Angiogram Selective Each Additional Vessel Result Date: 09/20/2024 CLINICAL DATA:  Benign prostatic hypertrophy with persistent lower urinary tract symptoms. See previous consultation. EXAM: EXAM LEFT PROSTATE ARTERY EMBOLIZATION TECHNIQUE: Informed consent was obtained from the patient following explanation of the procedure, risks, benefits and alternatives. The patient understands, agrees and consents for the procedure. All questions were addressed. A time out was performed prior to the initiation of the procedure. Maximal barrier sterile technique utilized including caps, mask, sterile gowns, sterile gloves, large sterile drape, hand hygiene, and chlorhexidine  prep. Intravenous Fentanyl  200mcg and Versed  4mg  were administered by RN during a total moderate (conscious) sedation time of 166 minutes; the patient's level of consciousness and  physiological / cardiorespiratory status were monitored continuously by radiology RN under my direct supervision. A micropuncture set was used access the right common femoral artery under ultrasound. Bentson wire advanced into the infrarenal aorta. 5 French vascular sheath placed. Through this, a Sos catheter placed across the aortic bifurcation and Glidewire advanced to the left SFA. The Sos was exchanged for a 4 French straight glide catheter, advanced to the left SFA. Guidewire exchanged for stiff allied, then the glide catheter was exchanged for a long 35 cm 5 French sheath, advanced to the distal left common iliac artery. Through this, a C2 catheter was advanced into the proximal anterior  division of the left internal iliac artery. Selective arteriography was obtained. The anterior tibial artery was selectively catheterized with a coaxial microcatheter and selective arteriography was performed. The left prostatic artery was catheterized with a microcatheter and selective arteriography and cone beam CT or performed, confirming flow to the prostate without significant nontarget enhancement. 50 mcg nitroglycerin  were administered intra-arterially into the proximal left prostatic artery. Branches of the left prostatic artery were embolized with 100-300 micron embospheres to near cessation of flow. Follow-up arteriogram significant demonstrates significant pruning of branches to the prostate with significant stasis in the proximal left prostatic artery. The microcatheter and angiographic catheter removed. The long 5 French sheath was exchanged over the Bentson wire for a irregular 5 French she. The Sos catheter was again utilized to with the aid of a Bentson wire to catheterize the internal iliac artery for selective cone beam CT. There was into terminated localization of possible prostatic artery branch arising from the distal internal pudendal artery. Multiple attempts with a broad a of microcatheter and guidewire  combinations were attempted but this vessel segment cannot be adequately catheterized. No other conspicuous candidates for right prostatic artery were identified. The microcatheter and angiographic catheter were withdrawn. The sheath was removed, hemostasis achieved, and closure obtained with 5 French Celt device. The patient tolerated the procedure well was transferred to the recovery area in good condition. FLUOROSCOPY: Radiation Exposure Index (as provided by the fluoroscopic device): 3208 mGy air Kerma COMPLICATIONS: COMPLICATIONS none IMPRESSION: 1. Technically successful left prostate artery embolization. 2. Deferred right prostate artery embolization due indeterminate localization and failure to catheterize suspected origin. Electronically Signed   By: JONETTA Faes M.D.   On: 09/20/2024 17:00   IR Angiogram Selective Each Additional Vessel Result Date: 09/20/2024 CLINICAL DATA:  Benign prostatic hypertrophy with persistent lower urinary tract symptoms. See previous consultation. EXAM: EXAM LEFT PROSTATE ARTERY EMBOLIZATION TECHNIQUE: Informed consent was obtained from the patient following explanation of the procedure, risks, benefits and alternatives. The patient understands, agrees and consents for the procedure. All questions were addressed. A time out was performed prior to the initiation of the procedure. Maximal barrier sterile technique utilized including caps, mask, sterile gowns, sterile gloves, large sterile drape, hand hygiene, and chlorhexidine  prep. Intravenous Fentanyl  200mcg and Versed  4mg  were administered by RN during a total moderate (conscious) sedation time of 166 minutes; the patient's level of consciousness and physiological / cardiorespiratory status were monitored continuously by radiology RN under my direct supervision. A micropuncture set was used access the right common femoral artery under ultrasound. Bentson wire advanced into the infrarenal aorta. 5 French vascular sheath  placed. Through this, a Sos catheter placed across the aortic bifurcation and Glidewire advanced to the left SFA. The Sos was exchanged for a 4 French straight glide catheter, advanced to the left SFA. Guidewire exchanged for stiff allied, then the glide catheter was exchanged for a long 35 cm 5 French sheath, advanced to the distal left common iliac artery. Through this, a C2 catheter was advanced into the proximal anterior division of the left internal iliac artery. Selective arteriography was obtained. The anterior tibial artery was selectively catheterized with a coaxial microcatheter and selective arteriography was performed. The left prostatic artery was catheterized with a microcatheter and selective arteriography and cone beam CT or performed, confirming flow to the prostate without significant nontarget enhancement. 50 mcg nitroglycerin  were administered intra-arterially into the proximal left prostatic artery. Branches of the left prostatic artery were embolized with 100-300 micron embospheres to  near cessation of flow. Follow-up arteriogram significant demonstrates significant pruning of branches to the prostate with significant stasis in the proximal left prostatic artery. The microcatheter and angiographic catheter removed. The long 5 French sheath was exchanged over the Bentson wire for a irregular 5 French she. The Sos catheter was again utilized to with the aid of a Bentson wire to catheterize the internal iliac artery for selective cone beam CT. There was into terminated localization of possible prostatic artery branch arising from the distal internal pudendal artery. Multiple attempts with a broad a of microcatheter and guidewire combinations were attempted but this vessel segment cannot be adequately catheterized. No other conspicuous candidates for right prostatic artery were identified. The microcatheter and angiographic catheter were withdrawn. The sheath was removed, hemostasis achieved, and  closure obtained with 5 French Celt device. The patient tolerated the procedure well was transferred to the recovery area in good condition. FLUOROSCOPY: Radiation Exposure Index (as provided by the fluoroscopic device): 3208 mGy air Kerma COMPLICATIONS: COMPLICATIONS none IMPRESSION: 1. Technically successful left prostate artery embolization. 2. Deferred right prostate artery embolization due indeterminate localization and failure to catheterize suspected origin. Electronically Signed   By: JONETTA Faes M.D.   On: 09/20/2024 17:00     The results of significant diagnostics from this hospitalization (including imaging, microbiology, ancillary and laboratory) are listed below for reference.     Microbiology: No results found for this or any previous visit (from the past 240 hours).   Labs: BNP (last 3 results) No results for input(s): BNP in the last 8760 hours. Basic Metabolic Panel: Recent Labs  Lab 09/20/24 0945 09/22/24 0756 09/24/24 0703 09/25/24 0551  NA 138 132* 133* 132*  K 4.1 4.2 3.8 3.9  CL 102 99 98 98  CO2 27 24 25 25   GLUCOSE 111* 142* 127* 123*  BUN 14 15 13 15   CREATININE 0.92 1.22 0.88 0.93  CALCIUM  9.0 8.9 8.3* 8.5*  MG  --   --  2.3 2.5*   Liver Function Tests: Recent Labs  Lab 09/20/24 0945 09/22/24 0756  AST 18 26  ALT 11 11  ALKPHOS 65 62  BILITOT 0.9 1.4*  PROT 7.2 6.9  ALBUMIN 4.1 4.0   No results for input(s): LIPASE, AMYLASE in the last 168 hours. No results for input(s): AMMONIA in the last 168 hours. CBC: Recent Labs  Lab 09/20/24 0945 09/22/24 0756 09/23/24 0727 09/24/24 0703 09/25/24 0551  WBC 4.6 10.4 10.4 9.1 6.9  NEUTROABS 3.5 9.1*  --   --   --   HGB 15.1 14.3 13.9 13.9 13.1  HCT 44.0 42.1 40.9 40.4 39.0  MCV 92.6 94.2 94.7 92.9 94.4  PLT 125* 108* 112* 130* 152   Cardiac Enzymes: No results for input(s): CKTOTAL, CKMB, CKMBINDEX, TROPONINI in the last 168 hours. BNP: Invalid input(s): POCBNP CBG: No  results for input(s): GLUCAP in the last 168 hours. D-Dimer No results for input(s): DDIMER in the last 72 hours. Hgb A1c No results for input(s): HGBA1C in the last 72 hours. Lipid Profile No results for input(s): CHOL, HDL, LDLCALC, TRIG, CHOLHDL, LDLDIRECT in the last 72 hours. Thyroid  function studies No results for input(s): TSH, T4TOTAL, T3FREE, THYROIDAB in the last 72 hours.  Invalid input(s): FREET3 Anemia work up No results for input(s): VITAMINB12, FOLATE, FERRITIN, TIBC, IRON, RETICCTPCT in the last 72 hours. Urinalysis    Component Value Date/Time   COLORURINE AMBER (A) 09/22/2024 0827   APPEARANCEUR CLEAR 09/22/2024 0827   LABSPEC  1.010 09/22/2024 0827   PHURINE 6.0 09/22/2024 0827   GLUCOSEU NEGATIVE 09/22/2024 0827   HGBUR LARGE (A) 09/22/2024 0827   BILIRUBINUR NEGATIVE 09/22/2024 0827   KETONESUR NEGATIVE 09/22/2024 0827   PROTEINUR 30 (A) 09/22/2024 0827   UROBILINOGEN 1.0 03/22/2011 2029   NITRITE POSITIVE (A) 09/22/2024 0827   LEUKOCYTESUR NEGATIVE 09/22/2024 0827   Sepsis Labs Recent Labs  Lab 09/22/24 0756 09/23/24 0727 09/24/24 0703 09/25/24 0551  WBC 10.4 10.4 9.1 6.9   Microbiology No results found for this or any previous visit (from the past 240 hours).   Time coordinating discharge:  I have spent 35 minutes face to face with the patient and on the ward discussing the patients care, assessment, plan and disposition with other care givers. >50% of the time was devoted counseling the patient about the risks and benefits of treatment/Discharge disposition and coordinating care.   SIGNED:   Burgess JAYSON Dare, MD  [facility] 09/26/2024, 8:18 AM   If 7PM-7AM, please contact night-coverage

## 2024-10-02 DIAGNOSIS — R338 Other retention of urine: Secondary | ICD-10-CM | POA: Diagnosis not present

## 2024-10-04 DIAGNOSIS — I10 Essential (primary) hypertension: Secondary | ICD-10-CM | POA: Diagnosis not present

## 2024-10-04 DIAGNOSIS — Z09 Encounter for follow-up examination after completed treatment for conditions other than malignant neoplasm: Secondary | ICD-10-CM | POA: Diagnosis not present

## 2024-10-04 DIAGNOSIS — Z87898 Personal history of other specified conditions: Secondary | ICD-10-CM | POA: Diagnosis not present

## 2024-10-04 DIAGNOSIS — Z7901 Long term (current) use of anticoagulants: Secondary | ICD-10-CM | POA: Diagnosis not present

## 2024-10-10 NOTE — Addendum Note (Signed)
 Encounter addended by: Crawford Dorothyann POUR on: 10/10/2024 11:59 AM  Actions taken: Imaging Exam ended, Charge Capture section accepted

## 2024-10-11 ENCOUNTER — Inpatient Hospital Stay: Admission: RE | Admit: 2024-10-11 | Discharge: 2024-10-11 | Attending: Radiology | Admitting: Radiology

## 2024-10-11 DIAGNOSIS — N401 Enlarged prostate with lower urinary tract symptoms: Secondary | ICD-10-CM

## 2024-11-22 ENCOUNTER — Telehealth: Payer: Self-pay | Admitting: Hematology

## 2024-11-22 NOTE — Telephone Encounter (Signed)
 Patient called needing to set up an appointment for labs and follow-up. He cancelled his past appt from November because he was in the hospital. I got him scheduled and he is aware.

## 2024-11-27 ENCOUNTER — Other Ambulatory Visit: Payer: Self-pay

## 2024-12-25 ENCOUNTER — Inpatient Hospital Stay

## 2024-12-25 ENCOUNTER — Inpatient Hospital Stay: Admitting: Hematology
